# Patient Record
Sex: Female | Born: 1935 | Race: White | Hispanic: No | Smoking: Never smoker
Health system: Southern US, Community
[De-identification: ages and names within clinical notes are randomized; demographics above are authoritative.]

## PROBLEM LIST (undated history)

## (undated) DIAGNOSIS — R51 Headache: Secondary | ICD-10-CM

## (undated) DIAGNOSIS — F32A Depression, unspecified: Secondary | ICD-10-CM

## (undated) DIAGNOSIS — C801 Malignant (primary) neoplasm, unspecified: Secondary | ICD-10-CM

## (undated) DIAGNOSIS — M1990 Unspecified osteoarthritis, unspecified site: Secondary | ICD-10-CM

## (undated) DIAGNOSIS — R0600 Dyspnea, unspecified: Secondary | ICD-10-CM

## (undated) DIAGNOSIS — Z96653 Presence of artificial knee joint, bilateral: Secondary | ICD-10-CM

## (undated) DIAGNOSIS — R519 Headache, unspecified: Secondary | ICD-10-CM

## (undated) DIAGNOSIS — N393 Stress incontinence (female) (male): Secondary | ICD-10-CM

## (undated) DIAGNOSIS — S060X9A Concussion with loss of consciousness of unspecified duration, initial encounter: Secondary | ICD-10-CM

## (undated) DIAGNOSIS — F329 Major depressive disorder, single episode, unspecified: Secondary | ICD-10-CM

## (undated) DIAGNOSIS — S060XAA Concussion with loss of consciousness status unknown, initial encounter: Secondary | ICD-10-CM

## (undated) DIAGNOSIS — I10 Essential (primary) hypertension: Secondary | ICD-10-CM

## (undated) DIAGNOSIS — E039 Hypothyroidism, unspecified: Secondary | ICD-10-CM

## (undated) HISTORY — PX: TONSILLECTOMY: SUR1361

## (undated) HISTORY — PX: EYE SURGERY: SHX253

## (undated) HISTORY — PX: REPLACEMENT TOTAL KNEE BILATERAL: SUR1225

## (undated) HISTORY — PX: ABDOMINAL HYSTERECTOMY: SHX81

## (undated) HISTORY — PX: TRIGGER FINGER RELEASE: SHX641

## (undated) HISTORY — DX: Presence of artificial knee joint, bilateral: Z96.653

## (undated) HISTORY — PX: APPENDECTOMY: SHX54

## (undated) HISTORY — PX: FINGER AMPUTATION: SHX636

## (undated) HISTORY — PX: TUBAL LIGATION: SHX77

## (undated) HISTORY — PX: KNEE ARTHROSCOPY: SHX127

## (undated) HISTORY — PX: CARPAL TUNNEL RELEASE: SHX101

## (undated) HISTORY — PX: COLONOSCOPY: SHX174

---
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
## 1998-03-04 ENCOUNTER — Ambulatory Visit (HOSPITAL_COMMUNITY): Admission: RE | Admit: 1998-03-04 | Discharge: 1998-03-04 | Payer: Self-pay | Admitting: Family Medicine

## 1999-04-10 ENCOUNTER — Ambulatory Visit (HOSPITAL_COMMUNITY): Admission: RE | Admit: 1999-04-10 | Discharge: 1999-04-10 | Payer: Self-pay | Admitting: Family Medicine

## 1999-04-10 ENCOUNTER — Encounter: Payer: Self-pay | Admitting: Family Medicine

## 1999-11-15 ENCOUNTER — Encounter: Payer: Self-pay | Admitting: Orthopedic Surgery

## 1999-11-16 ENCOUNTER — Inpatient Hospital Stay (HOSPITAL_COMMUNITY): Admission: RE | Admit: 1999-11-16 | Discharge: 1999-11-20 | Payer: Self-pay | Admitting: Orthopedic Surgery

## 2001-06-20 ENCOUNTER — Ambulatory Visit (HOSPITAL_COMMUNITY): Admission: RE | Admit: 2001-06-20 | Discharge: 2001-06-20 | Payer: Self-pay | Admitting: Family Medicine

## 2001-06-20 ENCOUNTER — Encounter: Payer: Self-pay | Admitting: Family Medicine

## 2003-05-14 ENCOUNTER — Encounter: Payer: Self-pay | Admitting: Family Medicine

## 2003-05-14 ENCOUNTER — Ambulatory Visit (HOSPITAL_COMMUNITY): Admission: RE | Admit: 2003-05-14 | Discharge: 2003-05-14 | Payer: Self-pay | Admitting: Family Medicine

## 2003-08-31 ENCOUNTER — Encounter: Admission: RE | Admit: 2003-08-31 | Discharge: 2003-08-31 | Payer: Self-pay | Admitting: Orthopedic Surgery

## 2003-09-01 ENCOUNTER — Ambulatory Visit (HOSPITAL_COMMUNITY): Admission: RE | Admit: 2003-09-01 | Discharge: 2003-09-01 | Payer: Self-pay | Admitting: Orthopedic Surgery

## 2003-09-01 ENCOUNTER — Ambulatory Visit (HOSPITAL_BASED_OUTPATIENT_CLINIC_OR_DEPARTMENT_OTHER): Admission: RE | Admit: 2003-09-01 | Discharge: 2003-09-01 | Payer: Self-pay | Admitting: Orthopedic Surgery

## 2004-08-31 ENCOUNTER — Ambulatory Visit (HOSPITAL_COMMUNITY): Admission: RE | Admit: 2004-08-31 | Discharge: 2004-08-31 | Payer: Self-pay | Admitting: Family Medicine

## 2004-12-07 ENCOUNTER — Ambulatory Visit (HOSPITAL_COMMUNITY): Admission: RE | Admit: 2004-12-07 | Discharge: 2004-12-07 | Payer: Self-pay | Admitting: Gastroenterology

## 2006-01-08 ENCOUNTER — Ambulatory Visit (HOSPITAL_COMMUNITY): Admission: RE | Admit: 2006-01-08 | Discharge: 2006-01-08 | Payer: Self-pay | Admitting: Family Medicine

## 2007-01-28 ENCOUNTER — Ambulatory Visit (HOSPITAL_COMMUNITY): Admission: RE | Admit: 2007-01-28 | Discharge: 2007-01-28 | Payer: Self-pay | Admitting: Family Medicine

## 2010-04-02 ENCOUNTER — Encounter: Admission: RE | Admit: 2010-04-02 | Discharge: 2010-04-02 | Payer: Self-pay | Admitting: Orthopaedic Surgery

## 2010-12-15 NOTE — Procedures (Signed)
[place]  Patient:    COLEY, LITTLES                    MRN: [id number] Proc. Date: 11/16/99 Adm. Date:  [id number] Attending:  Drema Pry Dictator:   Bedelia Person, M.D. CC:         Bedelia Person, M.D.                           Procedure Report  PROCEDURE:  Epidural catheter placement for postoperative epidural phentanyl pain control.  Prior to the surgical procedure the risks and benefits of postoperative epidural phentanyl were discussed with the patient, which included the risks of nerve damage, paralysis, back ache, headache, allergic reaction to the anesthetic, nausea, vomiting, and more commonly the risk of pruritis.  All of the patients questions were answered.  The patient had a prior epidural for knee surgery and desired another epidural at this time for postoperative pain control.  DESCRIPTION OF PROCEDURE:  After a surgical dressing was applied the patient was turned to the full right lateral decubitus position.  Betadine prep x 3 was performed on the lumbar region.  Sterile technique was used.  A #17 gauge Tuohy needle was inserted in the midline approach at the L3-4 interspace using loss of resistance.  The epidural space was located on the first pass.  There was no blood or CSF noted.  A non-stiletted catheter was then placed 6 cm into the epidural space and the needle was removed.  There was again a negative aspiration for blood or CSF.  A mixture of preservative-free Xylocaine 1% 5 cc 0.9 normal saline 10 cc and 100 mcg of phentanyl was then slowly injected into the epidural catheter.  The catheter was securely adhesed to the patients back using hypofix dressing over 2 x 2 sterile gauze at the insertion site. The patient was awakened, extubated, and taken to the recovery room in good condition, where she was placed on a continuous infusion of 5 mcg per cc epidural phentanyl, and 1/16% Marcaine at initial rate of 16 cc per  hour.  She will be evaluated over the next 48 hours for adequacy of pain control until her anticoagulant therapy is maximized. DD:  11/16/99 TD:  11/16/99 Job: [id number]

## 2010-12-15 NOTE — Op Note (Signed)
[place]. [facility]  Patient:    Samantha [name], Samantha [name]                    MRN: [id number] Proc. Date: 11/16/99 Adm. Date:  [id number] Attending:  Drema Pry Dictator:   Jearld Adjutant, M.D. CC:         Jearld Adjutant, M.D.                           Operative Report  PREOPERATIVE DIAGNOSIS:  Mechanical failure of meniscal bearings, polyethylene, status post right total knee arthroplasty with Depuy LCS meniscal bearing made.  POSTOPERATIVE DIAGNOSES: 1. Mechanical failure of meniscal bearings, polyethylene, status post right    total knee arthroplasty with Depuy LCS meniscal bearing made. 2. Excessive poly wear of medial and lateral meniscal bearing and posterior    patella.  OPERATIONS: 1. Right knee revision, total knee revision, with exchange of medial and    lateral meniscal bearing polys. 2. Exchange poly on posterior patella.  SURGEON:  Dr. Doristine Section.  ASSISTANTS: 1. Harvie Junior, M.D. 2. Currie Paris. Thedore Mins.  ANESTHESIA:  General endotracheal anesthesia.  CULTURES:  Joint fluid, gram stain negative.  ESTIMATED BLOOD LOSS:  100 cc.  REPLACEMENTS:  None.  TOURNIQUET TIME:  40 minutes.  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  She has had absolutely excellent course and has been extremely active.  She has gained some weight.  Recently in the last three weeks she has noted increasing pain with a varus thrust to her right knee and mechanical derangement of the right knee.  She had no sign of infection, did have some mild swelling.  She came into our office where x-rays revealed that she had marked narrowing of the medial knee, she had a varus thrust, and our feeling was that she had especially damaged the medial meniscal bearing.  It was recognized that these bearings were gamma  sterilized in air, and these poly bearings have been shown to be somewhat less effective.  Everyone was using them at the time, all manufacturers, and this is not the case now with different manufacturing processes.  In addition, their design has been somewhat modified.  We did not know whether or not the femoral component would be scored and have to be changed, or whether there was any loseness, nor exactly what we would find.  However, at surgery we did find evidence of poly wear with synovitis throughout the knee which we excised.  We had to do fairly significant soft tissue releases to get the knee exposed, but were able to remove both meniscal bearings that were markedly degenerated and worn, especially the medial one, and wear on the posterior patellar poly that we removed.  None of the metal components of the patella or femur were loose. There was no sign of infection, and our gram stain on the fluid, which looked non-purulent, was negative.  We did place a 15 mm poly medially bearing and laterally 12.5, replacing two 12.5.  This gave her slightly more valgus, and she had full extension with no significant joggle of motion on varus-valgus stressing.  She had full range of motion, 0 to 105 to 110.  DESCRIPTION OF PROCEDURE:  With adequate anesthesia obtained using endotracheal technique, 1 g of Ancef given IV  prophylaxis, the patient was placed in the supine position.  The right lower extremity was prepped and draped in the standard fashion from the toes to the tourniquet.  After standard prepping and draping, Esmarch exsanguination was used, and the tourniquet was let up to 350 mmHg.  The old wound was then opened.  Incision deepened.  Hemostasis was obtained using the Bovie electrocoagulator.  The median parapatellar retinacular incision was then made.  Soft tissues were dissected, as well as synovectomy of the obvious poly wear synovitis with flakes of polyethylene found in the  pouch and in the gutters.  We did protect the insertion of the patellar tendon with two pins.  We did a lateral retinacular release to facilitate the patellar eversion.  Later, we repaired this with one Mitek super anchor with #2 Ethibond.  In any case, the patella was ultimately everted, the knee flexed, and soft tissues dissected around underneath the medial collateral ligament and laterally in the notch.  The PCL was still intact, and did not have any significant posterior sag.  We then noted the poly wear and removed the meniscal bearings, as well as the posterior patella bearing.  We further excised soft tissues, and then thoroughly jet lavaged the knee with 3 L of normal saline and 500 mL of antibiotic solution.  We did trials and felt the 15 mm, 12.5 mm mismatch medial to lateral, respectively, was the most appropriate, and brought those on to the field, checked them for size, standard plus to large was the bearing size, and a standard plus patellar button.  The patellar button was then snapped in place, as was the meniscal bearings put in place with the above listed range of motion with good stability, no tendency to squirt forward on flexion.  Thorough irrigation was carried out again, and we let the tourniquet down, bleeding points were cauterized.  I then repaired with a Supramitek to the tibial tubercle, which still had a good attachment, but this was extra safety where a slight portion of it, probably 7 mm, had been evulsed, and this was sutured down with a horizontal mattress suture of #2 Ethibond.  Hemovac drains were placed in the medial and lateral gutter and brought out through the superolateral portal.  The wound was then closed in layers with #1 Vicryl on the retinaculum, 0 to 2-0 Vicryl on the subcutaneous, and skin staples. Bulk sterile compressive dressings applied with knee immobilizer, and Hemovac to autovac.  The patient, having tolerated the procedure well, was  awakened, taken to the recovery room in satisfactory condition for routine postoperative care, CT, and anticoagulant therapy. DD:  11/16/99 TD:  11/16/99 Job: [id number]  [id number]

## 2010-12-15 NOTE — Discharge Summary (Signed)
[place]. [facility]  Patient:    Samantha [name], Samantha [name]                    MRN: [id number] Adm. Date:  [id number] Disc. Date: [id number] Attending:  Drema Pry Dictator:   Currie Paris Orma Flaming, P.A.-C. CC:         Marinda Elk, M.D., [facility]                           Discharge Summary  ADMITTING DIAGNOSES: 1. 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 2. Status post right total knee replacement, February, 1995. 3. Status post left total knee replacement, March, 1995.  DISCHARGE DIAGNOSES: 1. 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 2. Status post right total knee replacement, February, 1995. 3. Status post left total knee replacement, March, 1995. 4. Mechanical failure of medial and lateral bearings and patellar polyethylene    component of right total knee replacement. 5. Urinary tract infection.  PROCEDURES IN HOSPITAL:  Revision of right total knee arthroplasty with changing of medial and lateral meniscal bearings and posterior patellar polyethylene component.  HISTORY OF PRESENT ILLNESS:  Samantha [name] is a 77 year old who is status post right TKR done six years ago.  Four to five months prior to this admission, she had onset of progressive pain with varus angulation of her right knee. She has not had an acute injury to it.  This slowly progressed over time.  She has gotten to the point where she has difficulty ambulating due to pain and plain x-rays were taken of the right knee weightbearing, and showed varus configuration with "crushing" of the medial bearing.  It was felt that she needed a revision total knee arthroplasty based upon her clinical and radiographic findings, and she was admitted for this.  PERTINENT LABORATORY STUDIES:  EKG on admission showed normal sinus rhythm, normal EKG.  Preoperative chest x-ray was  normal.  On admission, her hemoglobin was 11.8, hematocrit 36.1, WBC 6.1.  Hemoglobin on postoperative day #1 was 10.2, hemoglobin on postoperative day #2 was 9.6, and on #3 was 9.4.  Pro time on admission was 13.7 seconds with an INR of 1.1, PTT was 29.  On the date of discharge, her pro time was 17.5 seconds with an INR of 1.8.  CMET on admission was within normal limits.  Urinalysis showed no abnormalities.  Gram stain intraoperatively of the left knee showed no organisms.  Preoperative urine culture showed greater than 100,000 colonies of multiple species.  No organisms were seen on an anaerobic culture, as well. There was no growth, there were no anaerobes isolated.  HOSPITAL COURSE:  Patient underwent right total knee revision as well as described on her postoperative note on November 16, 1999.  She had an epidural Fentanyl catheter postoperatively which was placed by anesthesia.  She was also started on Coumadin antithrombolytic therapy per pharmacy protocol. Also, IV Ancef 1 g q.8h. was given x 6 doses.  On postoperative day #1, she had no pain, she had some nausea and vomiting.  Her IV pulled out.  Her temperature max was 101.2, then she was found to be afebrile.  Her vital signs were stable.  Her hemoglobin was 10.2.  Knee immobilizer was intact to her right leg.  NVS was intact distally.  She had greater  than 100,000 colonies of multiple species on her urine culture.  She was placed on Cipro 500 mg p.o. b.i.d. for two days in addition to the IV Ancef.  On postoperative day #2, the patient was doing well.  She did have a CPM machine for her knee and physical therapy was ordered for walker ambulation, weightbearing as tolerated on the right.  Her dressing was changed on postoperative day #2, and her Hemovac drain was discontinued.  Overall, she progressed very nicely.  On postoperative day #3, she was doing well with a low-grade fever of 102 maximum.  Her Hep-Lock was discontinued  on postoperative day #4, patient was doing well.  She was without complaint.  She was taking p.o. and voiding okay. She was up with physical therapy.  She was afebrile.  Her vital signs were stable.  Her right knee wound was benign.  Her calf was soft.  Her NVS was intact distally.  Pro time was 17.5 seconds and INR was 1.8.  She was discharged home in improved condition.  She was on a regular diet.  She was given prescription for Coumadin x 1 month postoperative, Tylox p.r.n. for pain, and Keflex 500 mg p.o. t.i.d. x 7 days.  Home health physical therapy was ordered for walker ambulation, weightbearing as tolerated on the right, as well as range of motion.  She will follow up in our office for 10 days for recheck. DD:  01/04/00 TD:  01/08/00 Job: [id number]

## 2010-12-15 NOTE — Op Note (Signed)
NAME:  Samantha [name], BUFFIN                       ACCOUNT NO.:  [id number]   MEDICAL RECORD NO.:  [id number]                   PATIENT TYPE:  AMB   LOCATION:  DSC                                  FACILITY:  [facility]   PHYSICIAN:  Harvie Junior, M.D.                DATE OF BIRTH:  Apr 18, 1931   DATE OF PROCEDURE:  09/01/2003  DATE OF DISCHARGE:                                 OPERATIVE REPORT   PREOPERATIVE DIAGNOSIS:  Trigger finger left ring.   POSTOPERATIVE DIAGNOSIS:  Trigger finger left ring.   PROCEDURE:  Release of left ring trigger finger.   SURGEON:  Harvie Junior, M.D.   ASSISTANT:  Marshia Ly, P.A.   ANESTHESIA:  Forearm based IV regional.   INDICATIONS FOR PROCEDURE:  This is a 77 year old female with a long history  of having locking and catching in the fingers.  She has been injected a  couple of times.  This worked initially, but continued to catch and lock on  her.  Because of continued catching and locking, she was ultimately brought  to the operating room for release of trigger digit.   DESCRIPTION OF PROCEDURE:  The patient was brought to the operating room and  after adequate anesthesia was obtained with the forearm based IV regional,  the patient was placed supine on the operating table and the left arm was  prepped and draped in the usual sterile fashion.  Following this, linear  incision was made between the two creases in the hand.  Subcutaneous tissues  were dissected down to the level of the A1 pulley.  The A1 pulley was  clearly identified and divided.  There were some horizontal appearing  fascial bands covering the A1 pulley. These were also divided both  proximally and distally.  The digital nerves were identified on the medial  and lateral side and protected with retractors.  At this point, the wound  was copiously irrigated and suctioned dry. The flexor tendons were pulled  out of the wound with a retractor and the hand was allowed to move under  visualization with no tendency towards catching.  At this point, the hand  was copiously irrigated and suctioned dry.  The skin was closed with 4-0  nylon interrupted stitches.  A sterile compressive dressing was applied. The  patient was taken to the recovery room where she was noted to be in  satisfactory condition.  Estimated blood loss for the procedure was none.                                               Harvie Junior, M.D.    Ranae Plumber  D:  09/01/2003  T:  09/01/2003  Job:  [id number]

## 2010-12-15 NOTE — Op Note (Signed)
NAMEDENISA, [name]             ACCOUNT NO.:  [id number]   MEDICAL RECORD NO.:  [id number]          PATIENT TYPE:  AMB   LOCATION:  ENDO                         FACILITY:  [facility]   PHYSICIAN:  Bernette Redbird, M.D.   DATE OF BIRTH:  12/30/1930   DATE OF PROCEDURE:  12/07/2004  DATE OF DISCHARGE:                                 OPERATIVE REPORT   PROCEDURE:  Colonoscopy.   INDICATIONS:  A 77 year old for initial colon cancer screening exam. No  worrisome risk factors or symptoms.   FINDINGS:  Sigmoid diverticulosis.   PROCEDURE IN DETAIL:  The purpose and risks of the procedure have been  discussed with the patient who provided written consent. Sedation was  fentanyl 75 mcg and Versed 6 mg intravenously without arrhythmias or  desaturation. The Olympus adjustable tension adult video colonoscope was  advanced to the terminal ileum, turning the patient into the supine position  to enter the base of the cecum. The terminal ileum was entered for a short  distance and appeared normal. Pullback was then performed. The quality of  the prep was excellent and it was felt that all areas were well seen.   There was mild to moderate sigmoid diverticulosis but this was otherwise a  normal exam without evidence of polyps, cancer, colitis, vascular  malformations, or diverticulosis. Retroflexion the rectum and reinspection  of the rectum were unremarkable. No biopsies were obtained. The patient  tolerated the procedure well. There no apparent complications.   IMPRESSION:  Unremarkable screening exam in a standard risk individual,  apart from sigmoid diverticulosis (V7 6.51).   PLAN:  Consider sigmoidoscopic evaluation in 5 years for continued  screening.      RB/MEDQ  D:  12/07/2004  T:  12/07/2004  Job:  [id number]   cc:   Molly Maduro L. Foy [place], M.D.  [address] [place]  [place] [address]  Fax: [phone]

## 2012-03-11 ENCOUNTER — Ambulatory Visit (HOSPITAL_BASED_OUTPATIENT_CLINIC_OR_DEPARTMENT_OTHER)
Admission: RE | Admit: 2012-03-11 | Discharge: 2012-03-11 | Disposition: A | Discharge status: Home / Self Care | Payer: Medicare Other | Source: Ambulatory Visit | Attending: Orthopedic Surgery | Admitting: Orthopedic Surgery

## 2012-03-11 ENCOUNTER — Encounter (HOSPITAL_BASED_OUTPATIENT_CLINIC_OR_DEPARTMENT_OTHER): Payer: Self-pay | Admitting: *Deleted

## 2012-03-11 ENCOUNTER — Encounter (HOSPITAL_BASED_OUTPATIENT_CLINIC_OR_DEPARTMENT_OTHER)
Admission: RE | Disposition: A | Discharge status: Home / Self Care | Payer: Self-pay | Source: Ambulatory Visit | Attending: Orthopedic Surgery

## 2012-03-11 DIAGNOSIS — M86149 Other acute osteomyelitis, unspecified hand: Secondary | ICD-10-CM | POA: Insufficient documentation

## 2012-03-11 DIAGNOSIS — S62639B Displaced fracture of distal phalanx of unspecified finger, initial encounter for open fracture: Secondary | ICD-10-CM | POA: Insufficient documentation

## 2012-03-11 DIAGNOSIS — W540XXA Bitten by dog, initial encounter: Secondary | ICD-10-CM | POA: Insufficient documentation

## 2012-03-11 DIAGNOSIS — IMO0002 Reserved for concepts with insufficient information to code with codable children: Secondary | ICD-10-CM | POA: Insufficient documentation

## 2012-03-11 DIAGNOSIS — L03019 Cellulitis of unspecified finger: Secondary | ICD-10-CM | POA: Insufficient documentation

## 2012-03-11 HISTORY — PX: I&D EXTREMITY: SHX5045

## 2012-03-11 HISTORY — DX: Essential (primary) hypertension: I10

## 2012-03-11 HISTORY — DX: Unspecified osteoarthritis, unspecified site: M19.90

## 2012-03-11 HISTORY — DX: Hypothyroidism, unspecified: E03.9

## 2012-03-11 SURGERY — MINOR IRRIGATION AND DEBRIDEMENT EXTREMITY
Anesthesia: LOCAL | Site: Finger | Laterality: Right | Wound class: Dirty or Infected

## 2012-03-11 MED ORDER — 0.9 % SODIUM CHLORIDE (POUR BTL) OPTIME
TOPICAL | Status: DC | PRN
  Administered 2012-03-11: 100 mL

## 2012-03-11 MED ORDER — LIDOCAINE HCL 2 % IJ SOLN
INTRAMUSCULAR | Status: DC | PRN
  Administered 2012-03-11: 5 mL

## 2012-03-11 MED ORDER — OXYCODONE-ACETAMINOPHEN 5-325 MG PO TABS: ORAL_TABLET | ORAL | Status: DC

## 2012-03-11 SURGICAL SUPPLY — 48 items
BANDAGE ADHESIVE 1X3 (GAUZE/BANDAGES/DRESSINGS) IMPLANT
BLADE SURG 15 STRL LF DISP TIS (BLADE) ×1 IMPLANT
BLADE SURG 15 STRL SS (BLADE) ×2
BNDG CMPR 9X4 STRL LF SNTH (GAUZE/BANDAGES/DRESSINGS)
BNDG CMPR MD 5X2 ELC HKLP STRL (GAUZE/BANDAGES/DRESSINGS)
BNDG COHESIVE 1X5 TAN STRL LF (GAUZE/BANDAGES/DRESSINGS) ×2 IMPLANT
BNDG COHESIVE 4X5 TAN STRL (GAUZE/BANDAGES/DRESSINGS) IMPLANT
BNDG ELASTIC 2 VLCR STRL LF (GAUZE/BANDAGES/DRESSINGS) IMPLANT
BNDG ESMARK 4X9 LF (GAUZE/BANDAGES/DRESSINGS) IMPLANT
BRUSH SCRUB EZ PLAIN DRY (MISCELLANEOUS) ×2 IMPLANT
CLOTH BEACON ORANGE TIMEOUT ST (SAFETY) ×2 IMPLANT
CORDS BIPOLAR (ELECTRODE) IMPLANT
COVER MAYO STAND STRL (DRAPES) ×2 IMPLANT
CUFF TOURNIQUET SINGLE 18IN (TOURNIQUET CUFF) ×2 IMPLANT
DECANTER SPIKE VIAL GLASS SM (MISCELLANEOUS) IMPLANT
DRAIN PENROSE 1/2X12 LTX STRL (WOUND CARE) ×1 IMPLANT
DRAIN PENROSE 1/4X12 LTX STRL (WOUND CARE) IMPLANT
DRAPE EXTREMITY T 121X128X90 (DRAPE) ×1 IMPLANT
DRAPE SURG 17X23 STRL (DRAPES) ×2 IMPLANT
DRAPE U 20/CS (DRAPES) IMPLANT
DRAPE U-SHAPE 47X51 STRL (DRAPES) IMPLANT
DRAPE U-SHAPE 76X120 STRL (DRAPES) IMPLANT
DRSG PAD ABDOMINAL 8X10 ST (GAUZE/BANDAGES/DRESSINGS) IMPLANT
GAUZE PACKING IODOFORM 1/4X5 (PACKING) ×1 IMPLANT
GAUZE SPONGE 4X4 12PLY STRL LF (GAUZE/BANDAGES/DRESSINGS) ×4 IMPLANT
GAUZE XEROFORM 1X8 LF (GAUZE/BANDAGES/DRESSINGS) ×1 IMPLANT
GLOVE BIO SURGEON STRL SZ7 (GLOVE) ×2 IMPLANT
GLOVE BIOGEL M STRL SZ7.5 (GLOVE) ×2 IMPLANT
GLOVE ORTHO TXT STRL SZ7.5 (GLOVE) ×2 IMPLANT
GOWN PREVENTION PLUS XLARGE (GOWN DISPOSABLE) ×4 IMPLANT
NEEDLE 27GAX1X1/2 (NEEDLE) ×2 IMPLANT
PACK BASIN DAY SURGERY FS (CUSTOM PROCEDURE TRAY) ×2 IMPLANT
PADDING CAST ABS 4INX4YD NS (CAST SUPPLIES)
PADDING CAST ABS COTTON 4X4 ST (CAST SUPPLIES) ×1 IMPLANT
SPONGE GAUZE 4X4 12PLY (GAUZE/BANDAGES/DRESSINGS) ×2 IMPLANT
STOCKINETTE 4X48 STRL (DRAPES) ×2 IMPLANT
STRIP CLOSURE SKIN 1/2X4 (GAUZE/BANDAGES/DRESSINGS) IMPLANT
SUT ETHILON 5 0 P 3 18 (SUTURE)
SUT NYLON ETHILON 5-0 P-3 1X18 (SUTURE) ×1 IMPLANT
SUT PROLENE 3 0 PS 2 (SUTURE) IMPLANT
SWAB CULTURE LIQ STUART DBL (MISCELLANEOUS) IMPLANT
SYR 3ML 23GX1 SAFETY (SYRINGE) IMPLANT
SYR CONTROL 10ML LL (SYRINGE) ×2 IMPLANT
TOWEL OR 17X24 6PK STRL BLUE (TOWEL DISPOSABLE) ×3 IMPLANT
TRAY DSU PREP LF (CUSTOM PROCEDURE TRAY) ×2 IMPLANT
TUBE ANAEROBIC SPECIMEN COL (MISCELLANEOUS) ×1 IMPLANT
UNDERPAD 30X30 INCONTINENT (UNDERPADS AND DIAPERS) ×2 IMPLANT
WATER STERILE IRR 1000ML POUR (IV SOLUTION) ×1 IMPLANT

## 2012-03-11 NOTE — Brief Op Note (Signed)
03/11/2012  9:52 AM  PATIENT:  Samantha Stone  78 y.o. female  PRE-OPERATIVE DIAGNOSIS:  right little finger dog bite / open fracture of distal phalanx 8 days prior  POST-OPERATIVE DIAGNOSIS:  Felon. 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  PROCEDURE:  Procedure(s) (LRB): IRRIGATION AND DEBRIDEMENT OPEN FRACTURE OF RIGHT SMALL DISTAL PHALANX, DRAINAGE OF PARONYCHIA AND FELON WITH CULTURE  SURGEON:  Surgeon(s) and Role:    * Wyn Forster., MD - Primary  PHYSICIAN ASSISTANT:   ASSISTANTS: Mallory Shirk.A-C   ANESTHESIA:   local  EBL:     BLOOD ADMINISTERED:none  DRAINS: none   LOCAL MEDICATIONS USED:  XYLOCAINE   SPECIMEN:  No Specimen  DISPOSITION OF SPECIMEN:  N/A  COUNTS:  YES  TOURNIQUET:   Total Tourniquet Time Documented: area (Right) - 11 minutes  DICTATION: .Other Dictation: Dictation Number [id number]  PLAN OF CARE: Discharge to home after PACU  PATIENT DISPOSITION:  PACU - hemodynamically stable.

## 2012-03-11 NOTE — Op Note (Signed)
[id number] 

## 2012-03-11 NOTE — H&P (Addendum)
  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 She underwent bilateral total knee replacement. She has done well with these since that time. She reports that 8 days ago she sustained a small bite to the tip of her right small finger from her own dog. Her dog's shots are all up-to-date. She cleaned the wound with soap and water and did not seek any medical care until 3 days later, when she developed some pain and swelling. At that time she went to her primary care physician. He placed her on Augmentin and performed a minor incision and drainage with cultures. The cultures eventually grew no significant growth. 3 days later she noted a marked increase in the pain and swelling of the finger. She went to urgent care locally. They performed additional incision and drainage and placed her on Septra. The patient did not tolerate the Septra, as it gave her a cough she continued with the Augmentin. Several days later she noted continued pain and swelling and telephone our office for further care. On exam, her finger has 2+ swelling of the pulp. She is quite tender to palpation. There is rubor/erythema of the entire distal phalanx with serosanguineous drainage from several of the volar incisions or vascular she is intact X ray of the small finger from Dr Pablo Lawrence office documents a fracture of the distal metaphysis of the distal phalanx. Impression: Felon of the right small finger.  Open fracture of distal phalanx.  Plan: Patient be taken to the operating room to undergo incision and drainage with possible placement of drain of the right small finger. The procedure risks, benefits, and postoperative course were discussed with the patient at length and she was in agreement with this plan.  Jonni Sanger PA-C  H&P documentation: 03/11/2012  -History and Physical Reviewed  -Patient has been re-examined  -No change in the plan of care  Wyn Forster, MD

## 2012-03-12 ENCOUNTER — Encounter (HOSPITAL_BASED_OUTPATIENT_CLINIC_OR_DEPARTMENT_OTHER): Payer: Self-pay | Admitting: Orthopedic Surgery

## 2012-03-12 NOTE — Op Note (Signed)
Samantha Stone             ACCOUNT NO.:  [id number]  MEDICAL RECORD NO.:  [id number]  LOCATION:                                 FACILITY:  PHYSICIAN:  Samantha Fitch. Thayer Inabinet, M.D.      DATE OF BIRTH:  DATE OF PROCEDURE:  03/11/2012 DATE OF DISCHARGE:                              OPERATIVE REPORT   PREOPERATIVE DIAGNOSIS:  Eight days status post complex dog bite wound to right small finger, distal phalanx, and nail mechanism.  POSTOPERATIVE DIAGNOSIS:  Acute osteomyelitis of open fracture of right small finger distal phalanx and subungual purulent collection i.e., stage III paronychia and advanced Phalen status post prior attempted drainage at outpatient center.  OPERATION: 1. Removal of nail plate and drainage of subungual abscess right small     finger. 2. Incision of felon with debridement of osteomyelitis of open     fracture of distal phalanx with anaerobic culture.  OPERATING SURGEON:  Samantha Fitch. Deeya Richeson, M.D.  ASSISTANT:  Jonni Sanger, P.A.  ANESTHESIA:  A 2% lidocaine metacarpal head level block of right small finger.  This was performed in minor operating room procedure.  INDICATIONS:  Samantha Stone is a 78 year old, right-hand dominant retired Producer, television/film/video.  We are well acquainted with Samantha Stone having worked with her throughout the 1980s and 1990s at [facility].  She had a 10-year-old Samantha Stone, who unfortunately fell and injured his back on March 03, 2012.  In attempting to help the dog she sustained a dog bite to her small finger.  She took the dog to a urgent Veterinary Clinic and was advised that the dog had a spinal fracture and paralysis.  The dog was ultimately put to sleep.  Within 48 hours Samantha Stone developed profound swelling and discomfort in her finger tips.  She was seen at an outpatient center where a block was placed in her finger and a small transverse incision was placed in the pulp in attempt to drain the finger.  This was unsuccessful and  she went on to develop profound discomfort including rubor drainage from beneath her nail plate and erythema ascending proximally towards the proximal phalangeal segment of the finger.  She was seen by Dr. Annitta Needs of [facility], who noted an acute Phalen late on the afternoon of March 10, 2012.  He requested a Hand Surgery consult.  I asked Dr. Annitta Needs to obtain an x-ray and to look for signs of a fracture or bone abscess.  Indeed a fracture of the distal phalanx and distal metaphysis was noted.  Arrangements were made for Samantha Stone to be n.p.o. and to be seen at 7:30 this morning for acute incision and drainage followed by cultures.  She was on oral Augmentin prior to surgery from the urgent care center.  PROCEDURE IN DETAIL:  Samantha Stone was brought to room #3 at the [facility] and placed in supine position on the operating table.  Following informed consent and Betadine prep.  A 2% lidocaine metacarpal head level block was placed without complication.  Within 5 minutes, excellent anesthesia of the small finger was achieved.  The right hand and arm were then prepped with Betadine soap and solution,  sterilely draped.  Following routine surgical time-out, the small finger was exsanguinated with gauze wrap and a 0.5-inch Penrose drain was placed with proximal phalangeal bases with digital tourniquet.  Confirming the anesthesia satisfactory, we subsequently removed the nail plate and found a significant abscess and purulent material deep to the dorsal nail fold.  There was granulation tissue present and a window directly through the distal phalanx where the tooth had passed.  A microcurette was used to remove all granulation tissue.  Attention was then directed to the palmar surface of the finger where midline incision was fashioned directly to the area of prior drainage.  We extended the incision to the distal phalanx and identified a pocket of necrotic tissue directly  at the open fracture.  A microcurette was used through the fracture site to remove devitalized bone and to thoroughly curette all granulation tissue.  A anaerobic culture swab was used to obtain cultures to rule out Eikenella corrodens and pasteurella species from this bite.  The palmar wound was then packed with iodoform gauze.  Open and the dorsal wound dressed with Xeroflo without replacement of the nail plate.  Samantha Stone  will remain on Augmentin 875 mg p.o. b.i.d.  She is provided a prescription for Percocet 5 mg 1 p.o. q.4-6 hours p.r.n. pain, #30 tablets, without refill.  We will also advised to take Aleve 2 tabs morning and 2 tablets in the evening.  We will see her back for followup in our office in 24 hours to review her early culture results, and to perform a dressing change.  She understands she may have a nail deformity due to the degree of infection and damage to the ventral nail matrix.  It is possible she will develop a nonunion of her fracture chronic osteomyelitis due to the 8th day delay in definitive care.     Samantha Stone, M.D.     RVS/MEDQ  D:  03/11/2012  T:  03/12/2012  Job:  [id number]  cc:   Ivar Bury, MD

## 2012-03-15 LAB — ANAEROBIC CULTURE: Gram Stain: NONE SEEN

## 2012-03-24 ENCOUNTER — Other Ambulatory Visit: Payer: Self-pay

## 2012-03-24 ENCOUNTER — Encounter (HOSPITAL_BASED_OUTPATIENT_CLINIC_OR_DEPARTMENT_OTHER)
Admission: RE | Admit: 2012-03-24 | Discharge: 2012-03-24 | Disposition: A | Discharge status: Home / Self Care | Payer: Medicare Other | Source: Ambulatory Visit | Attending: Orthopedic Surgery | Admitting: Orthopedic Surgery

## 2012-03-24 ENCOUNTER — Other Ambulatory Visit: Payer: Self-pay | Admitting: Orthopedic Surgery

## 2012-03-24 ENCOUNTER — Encounter (HOSPITAL_BASED_OUTPATIENT_CLINIC_OR_DEPARTMENT_OTHER): Payer: Self-pay | Admitting: *Deleted

## 2012-03-24 LAB — BASIC METABOLIC PANEL
BUN: 13 mg/dL (ref 6–23)
CO2: 30 mEq/L (ref 19–32)
Calcium: 9.6 mg/dL (ref 8.4–10.5)
Chloride: 97 mEq/L (ref 96–112)
Creatinine, Ser: 0.88 mg/dL (ref 0.50–1.10)
GFR calc Af Amer: 71 mL/min — ABNORMAL LOW (ref >90)
GFR calc non Af Amer: 61 mL/min — ABNORMAL LOW (ref >90)
Glucose, Bld: 98 mg/dL (ref 70–99)
Potassium: 4.1 mEq/L (ref 3.5–5.1)
Sodium: 136 mEq/L (ref 135–145)

## 2012-03-24 NOTE — Progress Notes (Signed)
To come in for bmet-ekg-retired or nurse

## 2012-03-25 ENCOUNTER — Ambulatory Visit (HOSPITAL_BASED_OUTPATIENT_CLINIC_OR_DEPARTMENT_OTHER): Payer: Medicare Other | Admitting: Anesthesiology

## 2012-03-25 ENCOUNTER — Encounter (HOSPITAL_BASED_OUTPATIENT_CLINIC_OR_DEPARTMENT_OTHER)
Admission: RE | Disposition: A | Discharge status: Home / Self Care | Payer: Self-pay | Source: Ambulatory Visit | Attending: Orthopedic Surgery

## 2012-03-25 ENCOUNTER — Encounter (HOSPITAL_BASED_OUTPATIENT_CLINIC_OR_DEPARTMENT_OTHER): Payer: Self-pay | Admitting: Anesthesiology

## 2012-03-25 ENCOUNTER — Encounter (HOSPITAL_BASED_OUTPATIENT_CLINIC_OR_DEPARTMENT_OTHER): Payer: Self-pay

## 2012-03-25 ENCOUNTER — Ambulatory Visit (HOSPITAL_BASED_OUTPATIENT_CLINIC_OR_DEPARTMENT_OTHER)
Admission: RE | Admit: 2012-03-25 | Discharge: 2012-03-25 | Disposition: A | Discharge status: Home / Self Care | Payer: Medicare Other | Source: Ambulatory Visit | Attending: Orthopedic Surgery | Admitting: Orthopedic Surgery

## 2012-03-25 DIAGNOSIS — Z90710 Acquired absence of both cervix and uterus: Secondary | ICD-10-CM | POA: Insufficient documentation

## 2012-03-25 DIAGNOSIS — E039 Hypothyroidism, unspecified: Secondary | ICD-10-CM | POA: Insufficient documentation

## 2012-03-25 DIAGNOSIS — Z01812 Encounter for preprocedural laboratory examination: Secondary | ICD-10-CM | POA: Insufficient documentation

## 2012-03-25 DIAGNOSIS — M129 Arthropathy, unspecified: Secondary | ICD-10-CM | POA: Insufficient documentation

## 2012-03-25 DIAGNOSIS — Z01810 Encounter for preprocedural cardiovascular examination: Secondary | ICD-10-CM | POA: Insufficient documentation

## 2012-03-25 DIAGNOSIS — I10 Essential (primary) hypertension: Secondary | ICD-10-CM | POA: Insufficient documentation

## 2012-03-25 DIAGNOSIS — M869 Osteomyelitis, unspecified: Secondary | ICD-10-CM | POA: Insufficient documentation

## 2012-03-25 DIAGNOSIS — Z96659 Presence of unspecified artificial knee joint: Secondary | ICD-10-CM | POA: Insufficient documentation

## 2012-03-25 SURGERY — INCISION AND DRAINAGE, ABSCESS
Anesthesia: General | Site: Finger | Laterality: Right | Wound class: Dirty or Infected

## 2012-03-25 MED ORDER — ONDANSETRON HCL 4 MG/2ML IJ SOLN
INTRAMUSCULAR | Status: DC | PRN
  Administered 2012-03-25: 4 mg via INTRAVENOUS

## 2012-03-25 MED ORDER — FENTANYL CITRATE 0.05 MG/ML IJ SOLN: 50.0000 ug | INTRAMUSCULAR | Status: DC | PRN

## 2012-03-25 MED ORDER — MIDAZOLAM HCL 5 MG/5ML IJ SOLN
INTRAMUSCULAR | Status: DC | PRN
  Administered 2012-03-25: 2 mg via INTRAVENOUS

## 2012-03-25 MED ORDER — CHLORHEXIDINE GLUCONATE 4 % EX LIQD: 60.0000 mL | Freq: Once | CUTANEOUS | Status: DC

## 2012-03-25 MED ORDER — SULFAMETHOXAZOLE-TRIMETHOPRIM 800-160 MG PO TABS: 1.0000 | ORAL_TABLET | Freq: Two times a day (BID) | ORAL | Status: AC

## 2012-03-25 MED ORDER — HYDROMORPHONE HCL PF 1 MG/ML IJ SOLN
0.2500 mg | INTRAMUSCULAR | Status: DC | PRN
  Administered 2012-03-25 (×2): 0.5 mg via INTRAVENOUS

## 2012-03-25 MED ORDER — PROPOFOL 10 MG/ML IV BOLUS
INTRAVENOUS | Status: DC | PRN
  Administered 2012-03-25: 180 mg via INTRAVENOUS

## 2012-03-25 MED ORDER — OXYCODONE-ACETAMINOPHEN 5-325 MG PO TABS
1.0000 | ORAL_TABLET | ORAL | Status: DC | PRN
  Administered 2012-03-25: 1 via ORAL

## 2012-03-25 MED ORDER — BUPIVACAINE HCL (PF) 0.25 % IJ SOLN
INTRAMUSCULAR | Status: DC | PRN
  Administered 2012-03-25: 5 mL

## 2012-03-25 MED ORDER — CHLORHEXIDINE GLUCONATE 4 % EX LIQD
60.0000 mL | Freq: Once | CUTANEOUS | Status: DC
Start: 2012-03-25 — End: 2012-03-25

## 2012-03-25 MED ORDER — MIDAZOLAM HCL 2 MG/2ML IJ SOLN: 1.0000 mg | INTRAMUSCULAR | Status: DC | PRN

## 2012-03-25 MED ORDER — LACTATED RINGERS IV SOLN
INTRAVENOUS | Status: DC
  Administered 2012-03-25: 09:00:00 via INTRAVENOUS
  Administered 2012-03-25: 10 mL/h via INTRAVENOUS

## 2012-03-25 MED ORDER — LIDOCAINE HCL (CARDIAC) 20 MG/ML IV SOLN
INTRAVENOUS | Status: DC | PRN
  Administered 2012-03-25: 80 mg via INTRAVENOUS

## 2012-03-25 MED ORDER — DEXAMETHASONE SODIUM PHOSPHATE 10 MG/ML IJ SOLN
INTRAMUSCULAR | Status: DC | PRN
  Administered 2012-03-25: 10 mg via INTRAVENOUS

## 2012-03-25 MED ORDER — VANCOMYCIN HCL 1000 MG IV SOLR
1500.0000 mg | Freq: Once | INTRAVENOUS | Status: AC
  Administered 2012-03-25: 1500 mg via INTRAVENOUS

## 2012-03-25 MED ORDER — FENTANYL CITRATE 0.05 MG/ML IJ SOLN
INTRAMUSCULAR | Status: DC | PRN
  Administered 2012-03-25 (×2): 50 ug via INTRAVENOUS

## 2012-03-25 MED ORDER — OXYCODONE-ACETAMINOPHEN 7.5-500 MG PO TABS: 1.0000 | ORAL_TABLET | ORAL | Status: AC | PRN

## 2012-03-25 MED ORDER — PROMETHAZINE HCL 25 MG/ML IJ SOLN: 6.2500 mg | INTRAMUSCULAR | Status: DC | PRN

## 2012-03-25 SURGICAL SUPPLY — 46 items
BAG DECANTER FOR FLEXI CONT (MISCELLANEOUS) IMPLANT
BANDAGE GAUZE ELAST BULKY 4 IN (GAUZE/BANDAGES/DRESSINGS) IMPLANT
BLADE MINI RND TIP GREEN BEAV (BLADE) IMPLANT
BLADE SURG 15 STRL LF DISP TIS (BLADE) ×1 IMPLANT
BLADE SURG 15 STRL SS (BLADE) ×2
BNDG CMPR 9X4 STRL LF SNTH (GAUZE/BANDAGES/DRESSINGS)
BNDG COHESIVE 1X5 TAN STRL LF (GAUZE/BANDAGES/DRESSINGS) IMPLANT
BNDG COHESIVE 3X5 TAN STRL LF (GAUZE/BANDAGES/DRESSINGS) IMPLANT
BNDG ESMARK 4X9 LF (GAUZE/BANDAGES/DRESSINGS) IMPLANT
CHLORAPREP W/TINT 26ML (MISCELLANEOUS) ×2 IMPLANT
CLOTH BEACON ORANGE TIMEOUT ST (SAFETY) ×2 IMPLANT
CORDS BIPOLAR (ELECTRODE) ×2 IMPLANT
COVER MAYO STAND STRL (DRAPES) ×2 IMPLANT
COVER TABLE BACK 60X90 (DRAPES) ×2 IMPLANT
CUFF TOURNIQUET SINGLE 18IN (TOURNIQUET CUFF) IMPLANT
DRAPE EXTREMITY T 121X128X90 (DRAPE) ×2 IMPLANT
DRAPE SURG 17X23 STRL (DRAPES) ×2 IMPLANT
DRSG KUZMA FLUFF (GAUZE/BANDAGES/DRESSINGS) ×2 IMPLANT
GAUZE PACKING IODOFORM 1/4X5 (PACKING) IMPLANT
GAUZE XEROFORM 1X8 LF (GAUZE/BANDAGES/DRESSINGS) ×2 IMPLANT
GLOVE BIO SURGEON STRL SZ 6.5 (GLOVE) ×2 IMPLANT
GLOVE SURG ORTHO 8.0 STRL STRW (GLOVE) ×2 IMPLANT
GOWN BRE IMP PREV XXLGXLNG (GOWN DISPOSABLE) ×2 IMPLANT
GOWN PREVENTION PLUS XLARGE (GOWN DISPOSABLE) ×2 IMPLANT
LOOP VESSEL MAXI BLUE (MISCELLANEOUS) IMPLANT
NEEDLE 27GAX1X1/2 (NEEDLE) ×1 IMPLANT
NS IRRIG 1000ML POUR BTL (IV SOLUTION) ×2 IMPLANT
PACK BASIN DAY SURGERY FS (CUSTOM PROCEDURE TRAY) ×2 IMPLANT
PAD CAST 3X4 CTTN HI CHSV (CAST SUPPLIES) IMPLANT
PADDING CAST ABS 4INX4YD NS (CAST SUPPLIES) ×1
PADDING CAST ABS COTTON 4X4 ST (CAST SUPPLIES) ×1 IMPLANT
PADDING CAST COTTON 3X4 STRL (CAST SUPPLIES)
SPLINT PLASTER CAST XFAST 3X15 (CAST SUPPLIES) IMPLANT
SPLINT PLASTER XTRA FASTSET 3X (CAST SUPPLIES)
SPONGE GAUZE 4X4 12PLY (GAUZE/BANDAGES/DRESSINGS) ×2 IMPLANT
STOCKINETTE 4X48 STRL (DRAPES) ×2 IMPLANT
SUT VICRYL RAPID 5 0 P 3 (SUTURE) IMPLANT
SUT VICRYL RAPIDE 4/0 PS 2 (SUTURE) ×2 IMPLANT
SWAB CULTURE LIQ STUART DBL (MISCELLANEOUS) ×1 IMPLANT
SYR BULB 3OZ (MISCELLANEOUS) ×2 IMPLANT
SYR CONTROL 10ML LL (SYRINGE) ×1 IMPLANT
TOWEL OR 17X24 6PK STRL BLUE (TOWEL DISPOSABLE) ×2 IMPLANT
TUBE ANAEROBIC SPECIMEN COL (MISCELLANEOUS) ×1 IMPLANT
TUBE FEEDING 5FR 15 INCH (TUBING) IMPLANT
UNDERPAD 30X30 INCONTINENT (UNDERPADS AND DIAPERS) ×2 IMPLANT
WATER STERILE IRR 1000ML POUR (IV SOLUTION) ×2 IMPLANT

## 2012-03-25 NOTE — Anesthesia Postprocedure Evaluation (Signed)
  Anesthesia Post-op Note  Patient: Samantha Stone  Procedure(s) Performed: Procedure(s) (LRB): INCISION AND DRAINAGE ABSCESS (Right)  Patient Location: PACU  Anesthesia Type: General  Level of Consciousness: awake and alert   Airway and Oxygen Therapy: Patient Spontanous Breathing  Post-op Pain: mild  Post-op Assessment: Post-op Vital signs reviewed, Patient's Cardiovascular Status Stable, Respiratory Function Stable, Patent Airway, No signs of Nausea or vomiting and Pain level controlled  Post-op Vital Signs: stable  Complications: No apparent anesthesia complications

## 2012-03-25 NOTE — H&P (Signed)
Samantha Stone is a 78 year old female who has been treated by Dr. Teressa Senter for a dog bite abscess on her right little finger open fracture of the distal phalanx. This was incised. The dog bite occurred on 03-03-12. She was doing well but has had progressive swelling and continued discharge. She has been on Augmentin. She has been in a splint. Her I&D was done on 03-11-12.   PAST MEDICAL HISTORY: She has no known drug allergies. She is on thyroid medication, BP medication and cholesterol medication. She has had bilateral total knees.   FAMILY H ISTORY: Negative.  SOCIAL HISTORY: She does not smoke. She drinks socially. She is married and a retired Charity fundraiser.  REVIEW OF SYSTEMS: Positive for glasses, otherwise negative for 14 points. Samantha Stone is an 78 y.o. female.   Chief Complaint: osteomyelitis RLF HPI: see above  Past Medical History  Diagnosis Date  . Hypertension   . Hypothyroidism   . Arthritis     Past Surgical History  Procedure Date  . I&d extremity 03/11/2012    Procedure: MINOR IRRIGATION AND DEBRIDEMENT EXTREMITY;  Surgeon: Wyn Forster., MD;  Location: [facility];  Service: Orthopedics;  Laterality: Right;  Right little finger  . Tonsillectomy   . Appendectomy   . Abdominal hysterectomy   . Trigger finger release   . Knee arthroscopy     bilat   . Replacement total knee bilateral   . Carpal tunnel release     rt x2  . Tubal ligation   . Colonoscopy     History reviewed. No pertinent family history. Social History:  reports that she has never smoked. She does not have any smokeless tobacco history on file. She reports that she drinks alcohol. Her drug history not on file.  Allergies: No Known Allergies  Medications Prior to Admission  Medication Sig Dispense Refill  . amoxicillin-clavulanate (AUGMENTIN) 500-125 MG per tablet Take 1 tablet by mouth 3 (three) times daily.      Marland Kitchen aspirin 81 MG tablet Take 81 mg by mouth daily.      Marland Kitchen  atenolol-chlorthalidone (TENORETIC) 100-25 MG per tablet Take 1 tablet by mouth daily.      . eszopiclone (LUNESTA) 2 MG TABS Take 2 mg by mouth at bedtime. Take immediately before bedtime      . folic acid (FOLVITE) 1 MG tablet Take 1 mg by mouth daily.      Marland Kitchen levothyroxine (SYNTHROID, LEVOTHROID) 137 MCG tablet Take 137 mcg by mouth daily.      . naproxen sodium (ANAPROX) 220 MG tablet Take 220 mg by mouth 2 (two) times daily with a meal.      . oxyCODONE-acetaminophen (PERCOCET/ROXICET) 5-325 MG per tablet 1 tab every 4 hours as needed for pain  24 tablet  0  . pravastatin (PRAVACHOL) 10 MG tablet Take 10 mg by mouth daily.      . vitamin C (ASCORBIC ACID) 500 MG tablet Take 500 mg by mouth daily.        Results for orders placed during the hospital encounter of 03/25/12 (from the past 48 hour(s))  BASIC METABOLIC PANEL     Status: Abnormal   Collection Time   03/24/12 12:40 PM      Component Value Range Comment   Sodium 136  135 - 145 mEq/L    Potassium 4.1  3.5 - 5.1 mEq/L    Chloride 97  96 - 112 mEq/L    CO2 30  19 - 32 mEq/L    Glucose, Bld 98  70 - 99 mg/dL    BUN 13  6 - 23 mg/dL    Creatinine, Ser 1.61  0.50 - 1.10 mg/dL    Calcium 9.6  8.4 - 09.6 mg/dL    GFR calc non Af Amer 61 (*) >90 mL/min    GFR calc Af Amer 71 (*) >90 mL/min     No results found.   Pertinent items are noted in HPI.  Blood pressure 146/73, pulse 51, temperature 97.4 F (36.3 C), temperature source Oral, resp. rate 20, height 5\' 6"  (1.676 m), weight 250 lb (113.399 kg), SpO2 98.00%.  General appearance: alert, cooperative and appears stated age Head: Normocephalic, without obvious abnormality Neck: no adenopathy Resp: clear to auscultation bilaterally Cardio: regular rate and rhythm, S1, S2 normal, no murmur, click, rub or gallop GI: soft, non-tender; bowel sounds normal; no masses,  no organomegaly Extremities: extremities normal, atraumatic, no cyanosis or edema Pulses: 2+ and  symmetric Skin: Skin color, texture, turgor normal. No rashes or lesions Neurologic: Grossly normal Incision/Wound: Drainage rlf tip  Assessment/Plan X-rays reveal destruction of the distal phalanx tuft area.  Plan is for re-incision and drainage and packing. Samantha Stone has requested amputation of the finger distal phalanx in an effort to cure the infection and protect her total knee arthroplasties from the possibility of seeding and the resultant infection.  Plan is amputation distal phalanRLF  Samantha Stone 03/25/2012, 9:27 AM

## 2012-03-25 NOTE — Anesthesia Preprocedure Evaluation (Signed)
Anesthesia Evaluation  Patient identified by MRN, date of birth, ID band Patient awake    Reviewed: Allergy & Precautions, H&P , NPO status , Patient's Chart, lab work & pertinent test results  Airway Mallampati: II TM Distance: >3 FB Neck ROM: Full    Dental   Pulmonary    Pulmonary exam normal       Cardiovascular hypertension,     Neuro/Psych    GI/Hepatic   Endo/Other  Hypothyroidism Morbid obesity  Renal/GU      Musculoskeletal   Abdominal (+) + obese,   Peds  Hematology   Anesthesia Other Findings   Reproductive/Obstetrics                           Anesthesia Physical Anesthesia Plan  ASA: III  Anesthesia Plan: General   Post-op Pain Management:    Induction: Intravenous  Airway Management Planned: LMA  Additional Equipment:   Intra-op Plan:   Post-operative Plan: Extubation in OR  Informed Consent: I have reviewed the patients History and Physical, chart, labs and discussed the procedure including the risks, benefits and alternatives for the proposed anesthesia with the patient or authorized representative who has indicated his/her understanding and acceptance.     Plan Discussed with: CRNA and Surgeon  Anesthesia Plan Comments:         Anesthesia Quick Evaluation

## 2012-03-25 NOTE — Anesthesia Procedure Notes (Signed)
Procedure Name: LMA Insertion Date/Time: 03/25/2012 10:27 AM Performed by: Burna Cash Pre-anesthesia Checklist: Patient identified, Emergency Drugs available, Suction available and Patient being monitored Patient Re-evaluated:Patient Re-evaluated prior to inductionOxygen Delivery Method: Circle System Utilized Preoxygenation: Pre-oxygenation with 100% oxygen Intubation Type: IV induction Ventilation: Mask ventilation without difficulty LMA: LMA with gastric port inserted LMA Size: 4.0 Number of attempts: 1 Placement Confirmation: positive ETCO2 Tube secured with: Tape Dental Injury: Teeth and Oropharynx as per pre-operative assessment

## 2012-03-25 NOTE — Op Note (Signed)
Dictated number: [id number]

## 2012-03-25 NOTE — Transfer of Care (Signed)
Immediate Anesthesia Transfer of Care Note  Patient: Samantha Stone  Procedure(s) Performed: Procedure(s) (LRB): INCISION AND DRAINAGE ABSCESS (Right)  Patient Location: PACU  Anesthesia Type: General  Level of Consciousness: sedated  Airway & Oxygen Therapy: Patient Spontanous Breathing and Patient connected to face mask oxygen  Post-op Assessment: Report given to PACU RN and Post -op Vital signs reviewed and stable  Post vital signs: Reviewed and stable  Complications: No apparent anesthesia complications

## 2012-03-25 NOTE — Brief Op Note (Signed)
03/25/2012  10:54 AM  PATIENT:  Samantha Stone  78 y.o. female  PRE-OPERATIVE DIAGNOSIS:  osteomyletis right small finger  POST-OPERATIVE DIAGNOSIS:  same as preop  PROCEDURE:  Procedure(s) (LRB): Amputation right small finger (Right)  SURGEON:  Surgeon(s) and Role:    * Nicki Reaper, MD - Primary  PHYSICIAN ASSISTANT:   ASSISTANTS: none   ANESTHESIA:   local and general  EBL:  Total I/O In: 800 [I.V.:800] Out: -   BLOOD ADMINISTERED:none  DRAINS: none   LOCAL MEDICATIONS USED:  MARCAINE     SPECIMEN:  Excision  DISPOSITION OF SPECIMEN:  PATHOLOGY  COUNTS:  YES  TOURNIQUET:   Total Tourniquet Time Documented: Forearm (Right) - 15 minutes  DICTATION: .Other Dictation: Dictation Number [id number]  PLAN OF CARE: Discharge to home after PACU  PATIENT DISPOSITION:  PACU - hemodynamically stable.

## 2012-03-26 LAB — POCT HEMOGLOBIN-HEMACUE: Hemoglobin: 13.3 g/dL (ref 12.0–15.0)

## 2012-03-26 NOTE — Op Note (Signed)
Samantha Stone, Samantha Stone              ACCOUNT NO.:  [id number]  MEDICAL RECORD NO.:  [id number]  LOCATION:                                 FACILITY:  PHYSICIAN:  Cindee Salt, M.D.            DATE OF BIRTH:  DATE OF PROCEDURE:  03/25/2012 DATE OF DISCHARGE:                              OPERATIVE REPORT   PREOPERATIVE DIAGNOSIS:  Osteomyelitis, distal phalanx, right little finger.  POSTOPERATIVE DIAGNOSIS:  Osteomyelitis, distal phalanx, right little finger.  OPERATION:  Amputation of distal phalanx, right little finger.  SURGEON:  Cindee Salt, MD  ANESTHESIA:  General with metacarpal block.  ANESTHESIOLOGIST:  Bedelia Person, MD  HISTORY:  The patient is a 78 year old female who suffered a dog bite approximately 2 weeks ago.  She has undergone IV treatment after incision and drainage.  She grew out Pasteurella multocida, has been on Augmentin.  She has developed osteomyelitis of the distal phalanx with destruction, drainage from the incision area over the past weekend.  She has total knee replacement, was admitted for incision and drainage, continuation of antibiotic, reculturing.  In the preoperative area, she stated that she was preferred to have the tip amputated in an effort to try to protect the total knee replacement, which she had from being seated by the infection and has requested amputation.  As such, the procedure was trained from incision and drainage, the amputation.  She is aware of risks and complications including infection, recurrence, continuation of problems, possibility of further intervention being necessary.  The extremity marked by both the patient and surgeon.  PROCEDURE:  The patient was brought to the operating room where a general anesthetic was carried out without difficulty.  She was prepped using ChloraPrep, supine position with the right arm free.  A 3-minute dry time was allowed.  She was draped.  The limb was exsanguinated from the wrist proximally  with an Esmarch bandage.  Tourniquet was placed on the forearm, was inflated to 250 mmHg.  A Penrose drain tourniquet was then used at the base of the finger.  A curvilinear incision, dorsal palmar flap was then created keeping this out of the area of initial injury.  There was no erythema to the tissue.  Incisions were made, carried down, bleeders were electrocauterized with bipolar.  The neurovascular bundles were identified.  The nerves were coagulated, brought into traction and cut and allowed to retract.  The digital arteries were each identified, these were electrically coagulated with bipolar.  The flexor tendon was cut, no significant fluid was encountered.  The extensor tendon was cut along with collateral ligaments and the distal phalanx was removed en bloc.  The cartilage was then removed and the tip of the middle phalanx was then smoothed due to the osteophytes and arthritic changes.  The wound was then copiously irrigated with saline.  The Penrose drain tourniquet was removed.  The blood was milked distally to remove any blood from the finger and the skin edges were very loosely reapproximated with 4-0 nylon sutures.  The finger tip was then incised.  A purulent material was immediately encountered.  Cultures were taken for both aerobic and anaerobic  cultures.  Prior to deflation of the tourniquet, she was given a 1.5 of vancomycin.  She tolerated the procedure well.  A sterile compressive dressing applied and she was taken to the recovery room for observation.          ______________________________ Cindee Salt, M.D.     GK/MEDQ  D:  03/25/2012  T:  03/26/2012  Job:  [id number]

## 2012-03-28 LAB — CULTURE, ROUTINE-ABSCESS: Culture: NO GROWTH

## 2012-03-30 LAB — ANAEROBIC CULTURE

## 2013-03-04 ENCOUNTER — Other Ambulatory Visit: Payer: Self-pay | Admitting: Oncology

## 2013-10-20 ENCOUNTER — Other Ambulatory Visit: Payer: Self-pay | Admitting: Neurological Surgery

## 2013-10-20 DIAGNOSIS — S060X9A Concussion with loss of consciousness of unspecified duration, initial encounter: Secondary | ICD-10-CM

## 2013-10-22 ENCOUNTER — Emergency Department (HOSPITAL_BASED_OUTPATIENT_CLINIC_OR_DEPARTMENT_OTHER): Payer: Medicare Other

## 2013-10-22 ENCOUNTER — Emergency Department (HOSPITAL_BASED_OUTPATIENT_CLINIC_OR_DEPARTMENT_OTHER)
Admission: EM | Admit: 2013-10-22 | Discharge: 2013-10-23 | Disposition: A | Discharge status: Home / Self Care | Payer: Medicare Other | Source: Home / Self Care | Attending: Emergency Medicine | Admitting: Emergency Medicine

## 2013-10-22 ENCOUNTER — Encounter (HOSPITAL_BASED_OUTPATIENT_CLINIC_OR_DEPARTMENT_OTHER): Payer: Self-pay | Admitting: Emergency Medicine

## 2013-10-22 DIAGNOSIS — Z79899 Other long term (current) drug therapy: Secondary | ICD-10-CM

## 2013-10-22 DIAGNOSIS — E079 Disorder of thyroid, unspecified: Secondary | ICD-10-CM

## 2013-10-22 DIAGNOSIS — I10 Essential (primary) hypertension: Secondary | ICD-10-CM

## 2013-10-22 DIAGNOSIS — F0781 Postconcussional syndrome: Secondary | ICD-10-CM

## 2013-10-22 DIAGNOSIS — M129 Arthropathy, unspecified: Secondary | ICD-10-CM | POA: Insufficient documentation

## 2013-10-22 DIAGNOSIS — E039 Hypothyroidism, unspecified: Secondary | ICD-10-CM | POA: Insufficient documentation

## 2013-10-22 DIAGNOSIS — Z7982 Long term (current) use of aspirin: Secondary | ICD-10-CM

## 2013-10-22 DIAGNOSIS — R51 Headache: Principal | ICD-10-CM | POA: Insufficient documentation

## 2013-10-22 DIAGNOSIS — Z792 Long term (current) use of antibiotics: Secondary | ICD-10-CM

## 2013-10-22 NOTE — ED Provider Notes (Signed)
CSN: [id number]     Arrival date & time 10/22/13  2234 History   First MD Initiated Contact with Patient 10/22/13 2307     Chief Complaint  Patient presents with  . Headache     (Consider location/radiation/quality/duration/timing/severity/associated sxs/prior Treatment) HPI This is an 80 year old female who fell and hit the right side of her head in [place] 3 weeks ago. She had a CT scan at that time that was unremarkable. She has subsequently had severe headaches on the right side of her head. She is seeing her primary care physician as well as Dr. Ellene Route of neurosurgery, who has ordered a repeat CT scan of the head; the CT is pending due to insurance. She has had some difficulty with memory and simple tasks. As an example, the day after her fall she couldn't remember how to make a grilled cheese sandwich. She continues to have similar lapses but is aware of these lapses. She has not had any focal neurologic deficit. She has had some mild nausea but has not taken anything for it currently. Her appetite has been somewhat decreased. She has not been vomiting. She denies pain other than the right side of her head. She has not had any difficulty walking.  Past Medical History  Diagnosis Date  . Hypertension   . Hypothyroidism   . Arthritis    Past Surgical History  Procedure Laterality Date  . I&d extremity  03/11/2012    Procedure: MINOR IRRIGATION AND DEBRIDEMENT EXTREMITY;  Surgeon: Cammie Sickle., MD;  Location: [place];  Service: Orthopedics;  Laterality: Right;  Right little finger  . Tonsillectomy    . Appendectomy    . Abdominal hysterectomy    . Trigger finger release    . Knee arthroscopy      bilat   . Replacement total knee bilateral    . Carpal tunnel release      rt x2  . Tubal ligation    . Colonoscopy     No family history on file. History  Substance Use Topics  . Smoking status: Never Smoker   . Smokeless tobacco: Not on file  . Alcohol  Use: Yes     Comment: occ   OB History   Grav Para Term Preterm Abortions TAB SAB Ect Mult Living                 Review of Systems  All other systems reviewed and are negative.   Allergies  Phenergan  Home Medications   Current Outpatient Rx  Name  Route  Sig  Dispense  Refill  . amoxicillin-clavulanate (AUGMENTIN) 500-125 MG per tablet   Oral   Take 1 tablet by mouth 3 (three) times daily.         Marland Kitchen aspirin 81 MG tablet   Oral   Take 81 mg by mouth daily.         Marland Kitchen atenolol-chlorthalidone (TENORETIC) 100-25 MG per tablet   Oral   Take 1 tablet by mouth daily.         . eszopiclone (LUNESTA) 2 MG TABS   Oral   Take 2 mg by mouth at bedtime. Take immediately before bedtime         . folic acid (FOLVITE) 1 MG tablet   Oral   Take 1 mg by mouth daily.         Marland Kitchen levothyroxine (SYNTHROID, LEVOTHROID) 137 MCG tablet   Oral   Take 137 mcg by mouth  daily.         . naproxen sodium (ANAPROX) 220 MG tablet   Oral   Take 220 mg by mouth 2 (two) times daily with a meal.         . oxyCODONE-acetaminophen (PERCOCET/ROXICET) 5-325 MG per tablet      1 tab every 4 hours as needed for pain   24 tablet   0   . pravastatin (PRAVACHOL) 10 MG tablet   Oral   Take 10 mg by mouth daily.         . vitamin C (ASCORBIC ACID) 500 MG tablet   Oral   Take 500 mg by mouth daily.          BP 193/69  Pulse 60  Temp(Src) 97.5 F (36.4 C) (Oral)  Resp 20  Ht 5\' 6"  (1.676 m)  Wt 250 lb (113.399 kg)  BMI 40.37 kg/m2  SpO2 100%  Physical Exam General: Well-developed, well-nourished female in no acute distress; appearance consistent with age of record HENT: normocephalic; atraumatic Eyes: pupils equal, round and reactive to light; extraocular muscles intact Neck: supple Heart: regular rate and rhythm Lungs: clear to auscultation bilaterally Abdomen: soft; nondistended; nontender; bowel sounds present Extremities: No deformity; full range of motion; pulses  normal Neurologic: Awake, alert and oriented x 4; motor function intact in all extremities and symmetric; no facial droop; normal coordination speech; negative Romberg; normal finger to nose Skin: Warm and dry Psychiatric: Normal mood and affect    ED Course  Procedures (including critical care time)   MDM  Nursing notes and vitals signs, including pulse oximetry, reviewed.  Summary of this visit's results, reviewed by myself:  Labs:  No results found for this or any previous visit (from the past 24 hour(s)).  Imaging Studies: Ct Head Wo Contrast  10/23/2013   CLINICAL DATA:  Fall, hit right side of head  EXAM: CT HEAD WITHOUT CONTRAST  TECHNIQUE: Contiguous axial images were obtained from the base of the skull through the vertex without intravenous contrast.  COMPARISON:  None.  FINDINGS: Scattered and confluent hypodensity within the periventricular and deep white matter both cerebral hemispheres is compatible with moderate chronic microvascular ischemic disease. Mild age-related atrophy is present.  There is no acute intracranial hemorrhage or infarct. No mass lesion or midline shift. Gray-white matter differentiation is well maintained. Ventricles are normal in size without evidence of hydrocephalus.  The calvarium is intact.  Orbital soft tissues are within normal limits.  Circumferential opacity present within the left sphenoid sinus. Paranasal sinuses are otherwise largely clear. No mastoid effusion.  Scalp soft tissues are unremarkable.  IMPRESSION: 1. No acute intracranial process. 2. Mild atrophy with moderate chronic microvascular ischemic changes. 3. Less sphenoid sinus disease.   Electronically Signed   By: Jeannine Boga M.D.   On: 10/23/2013 00:20        Wynetta Fines, MD 10/23/13 0030

## 2013-10-22 NOTE — ED Notes (Signed)
Headache. She fell 3 weeks ago. Was seen in [place] and had a negative CT scan. Her MD told her she had a contusion of her brain. Her MD plans to repeat her CT this week.

## 2013-10-22 NOTE — ED Notes (Signed)
Pt stated that she has been evaluated by dr. Ellene Route since fall  In [place], he is a personal friend. Pt states that she was trying to get scheduled for a follow up ct, but it has been delayed due to needing insurance approval. Pt also reports taking a hydrocodone this pm at 2030 for pain, with no improvement

## 2013-10-23 ENCOUNTER — Encounter (HOSPITAL_BASED_OUTPATIENT_CLINIC_OR_DEPARTMENT_OTHER): Payer: Self-pay | Admitting: Emergency Medicine

## 2013-10-23 ENCOUNTER — Observation Stay (HOSPITAL_BASED_OUTPATIENT_CLINIC_OR_DEPARTMENT_OTHER)
Admission: EM | Admit: 2013-10-23 | Discharge: 2013-10-25 | Disposition: A | Discharge status: Home / Self Care | Payer: Medicare Other | Attending: Internal Medicine | Admitting: Internal Medicine

## 2013-10-23 ENCOUNTER — Emergency Department (HOSPITAL_BASED_OUTPATIENT_CLINIC_OR_DEPARTMENT_OTHER): Payer: Medicare Other

## 2013-10-23 DIAGNOSIS — R4701 Aphasia: Secondary | ICD-10-CM | POA: Diagnosis present

## 2013-10-23 DIAGNOSIS — I10 Essential (primary) hypertension: Secondary | ICD-10-CM | POA: Diagnosis present

## 2013-10-23 DIAGNOSIS — G459 Transient cerebral ischemic attack, unspecified: Secondary | ICD-10-CM

## 2013-10-23 LAB — COMPREHENSIVE METABOLIC PANEL
ALT: 20 U/L (ref 0–35)
AST: 23 U/L (ref 0–37)
Albumin: 4.1 g/dL (ref 3.5–5.2)
Alkaline Phosphatase: 66 U/L (ref 39–117)
BUN: 16 mg/dL (ref 6–23)
CO2: 29 mEq/L (ref 19–32)
Calcium: 9.8 mg/dL (ref 8.4–10.5)
Chloride: 98 mEq/L (ref 96–112)
Creatinine, Ser: 0.9 mg/dL (ref 0.50–1.10)
GFR calc Af Amer: 68 mL/min — ABNORMAL LOW (ref >90)
GFR calc non Af Amer: 59 mL/min — ABNORMAL LOW (ref >90)
Glucose, Bld: 115 mg/dL — ABNORMAL HIGH (ref 70–99)
Potassium: 4.5 mEq/L (ref 3.7–5.3)
Sodium: 137 mEq/L (ref 137–147)
Total Bilirubin: 0.5 mg/dL (ref 0.3–1.2)
Total Protein: 7.3 g/dL (ref 6.0–8.3)

## 2013-10-23 LAB — URINALYSIS, ROUTINE W REFLEX MICROSCOPIC
Bilirubin Urine: NEGATIVE
Glucose, UA: NEGATIVE mg/dL
Hgb urine dipstick: NEGATIVE
Ketones, ur: NEGATIVE mg/dL
Leukocytes, UA: NEGATIVE
Nitrite: NEGATIVE
Protein, ur: NEGATIVE mg/dL
Specific Gravity, Urine: 1.023 (ref 1.005–1.030)
Urobilinogen, UA: 0.2 mg/dL (ref 0.0–1.0)
pH: 7.5 (ref 5.0–8.0)

## 2013-10-23 LAB — PROTIME-INR
INR: 1.04 (ref 0.00–1.49)
Prothrombin Time: 13.4 seconds (ref 11.6–15.2)

## 2013-10-23 LAB — CBC WITH DIFFERENTIAL/PLATELET
Basophils Absolute: 0 10*3/uL (ref 0.0–0.1)
Basophils Relative: 0 % (ref 0–1)
Eosinophils Absolute: 0.1 10*3/uL (ref 0.0–0.7)
Eosinophils Relative: 2 % (ref 0–5)
HCT: 41.9 % (ref 36.0–46.0)
Hemoglobin: 14.3 g/dL (ref 12.0–15.0)
Lymphocytes Relative: 24 % (ref 12–46)
Lymphs Abs: 1.9 10*3/uL (ref 0.7–4.0)
MCH: 29.4 pg (ref 26.0–34.0)
MCHC: 34.1 g/dL (ref 30.0–36.0)
MCV: 86.2 fL (ref 78.0–100.0)
Monocytes Absolute: 0.9 10*3/uL (ref 0.1–1.0)
Monocytes Relative: 11 % (ref 3–12)
Neutro Abs: 5 10*3/uL (ref 1.7–7.7)
Neutrophils Relative %: 63 % (ref 43–77)
Platelets: 274 10*3/uL (ref 150–400)
RBC: 4.86 MIL/uL (ref 3.87–5.11)
RDW: 13.8 % (ref 11.5–15.5)
WBC: 7.9 10*3/uL (ref 4.0–10.5)

## 2013-10-23 LAB — TROPONIN I: Troponin I: <0.3 ng/mL (ref <0.30)

## 2013-10-23 MED ORDER — NAPROXEN 250 MG PO TABS
500.0000 mg | ORAL_TABLET | Freq: Once | ORAL | Status: AC
Start: 2013-10-23 — End: 2013-10-23
  Administered 2013-10-23: 500 mg via ORAL
  Filled 2013-10-23: qty 2

## 2013-10-23 NOTE — ED Notes (Signed)
Headache and hypertension. Confusion. She was seen yesterday for same.

## 2013-10-23 NOTE — Discharge Instructions (Signed)

## 2013-10-23 NOTE — ED Notes (Signed)
Report given to Tori RN on [facility] for room 14

## 2013-10-23 NOTE — ED Provider Notes (Signed)
CSN: [id number]     Arrival date & time 10/23/13  2039 History  This chart was scribed for Veryl Speak, MD by Maree Erie, ED Scribe. The patient was seen in room MH07/MH07. Patient's care was started at 9:12 PM.      Chief Complaint  Patient presents with  . Headache  . Hypertension      Patient is a 80 y.o. female presenting with hypertension. The history is provided by the patient. No language interpreter was used.  Hypertension    HPI Comments: GREENLY RARICK is a 80 y.o. female who presents to the Emergency Department complaining of an episode of expressive aphasia that occurred three hours ago. She reports a similar, less severe episode of aphasia that occurred last night prior to being seen in the ED. However, she was seen in the ED last night due to severe, right sided headaches that have been persistent since she fell and hit her head on a night table three weeks ago in [place]. She had a CT scan of the head performed at that time that was unremarkable. She was seen by Dr. Ellene Route, a Neurosurgeon in [place] on 3/20 and had a repeat CT ordered that was pending due to insurance issues. The CT last night was also unremarkable so she was discharged. She reports that she was doing well today until the episode occurred. She also checked her BP prior to arrival and states it was 187/70. She reports a baseline BP in the 093A systolic. Her daughters-in-law state that she has been having intermittent episodes of confusion since the fall occurred three weeks ago. She denies fever, cough, congestion, leg swelling, numbness or tingling in her extremities. She denies a history of cardiac issues. She denies taking anticoagulants. She states that she typically drinks two to three beers a day but has not had any alcohol in a few days.    Past Medical History  Diagnosis Date  . Hypertension   . Hypothyroidism   . Arthritis    Past Surgical History  Procedure Laterality Date  . I&d  extremity  03/11/2012    Procedure: MINOR IRRIGATION AND DEBRIDEMENT EXTREMITY;  Surgeon: Cammie Sickle., MD;  Location: [facility];  Service: Orthopedics;  Laterality: Right;  Right little finger  . Tonsillectomy    . Appendectomy    . Abdominal hysterectomy    . Trigger finger release    . Knee arthroscopy      bilat   . Replacement total knee bilateral    . Carpal tunnel release      rt x2  . Tubal ligation    . Colonoscopy     No family history on file. History  Substance Use Topics  . Smoking status: Never Smoker   . Smokeless tobacco: Not on file  . Alcohol Use: Yes     Comment: occ   OB History   Grav Para Term Preterm Abortions TAB SAB Ect Mult Living                 Review of Systems A complete 10 system review of systems was obtained and all systems are negative except as noted in the HPI and PMH.     Allergies  Phenergan  Home Medications   Current Outpatient Rx  Name  Route  Sig  Dispense  Refill  . amoxicillin-clavulanate (AUGMENTIN) 500-125 MG per tablet   Oral   Take 1 tablet by mouth 3 (three) times daily.         Marland Kitchen  aspirin 81 MG tablet   Oral   Take 81 mg by mouth daily.         Marland Kitchen atenolol-chlorthalidone (TENORETIC) 100-25 MG per tablet   Oral   Take 1 tablet by mouth daily.         . eszopiclone (LUNESTA) 2 MG TABS   Oral   Take 2 mg by mouth at bedtime. Take immediately before bedtime         . folic acid (FOLVITE) 1 MG tablet   Oral   Take 1 mg by mouth daily.         Marland Kitchen levothyroxine (SYNTHROID, LEVOTHROID) 137 MCG tablet   Oral   Take 137 mcg by mouth daily.         . naproxen sodium (ANAPROX) 220 MG tablet   Oral   Take 220 mg by mouth 2 (two) times daily with a meal.         . oxyCODONE-acetaminophen (PERCOCET/ROXICET) 5-325 MG per tablet      1 tab every 4 hours as needed for pain   24 tablet   0   . pravastatin (PRAVACHOL) 10 MG tablet   Oral   Take 10 mg by mouth daily.         .  vitamin C (ASCORBIC ACID) 500 MG tablet   Oral   Take 500 mg by mouth daily.          Triage Vitals: BP 204/67  Pulse 59  Temp(Src) 97.7 F (36.5 C) (Oral)  Resp 20  Ht 5\' 6"  (1.676 m)  Wt 250 lb (113.399 kg)  BMI 40.37 kg/m2  SpO2 100%  Physical Exam  Nursing note and vitals reviewed. Constitutional: She is oriented to person, place, and time. She appears well-developed and well-nourished. No distress.  HENT:  Head: Normocephalic and atraumatic.  Mouth/Throat: Oropharynx is clear and moist.  Neck: Normal range of motion. Neck supple.  Cardiovascular: Normal rate and regular rhythm.  Exam reveals no gallop and no friction rub.   No murmur heard. Pulmonary/Chest: Effort normal and breath sounds normal. No respiratory distress. She has no wheezes. She has no rales.  Abdominal: Soft. She exhibits no distension. There is no tenderness. There is no rebound and no guarding.  Neurological: She is alert and oriented to person, place, and time. No cranial nerve deficit or sensory deficit. She exhibits normal muscle tone. Coordination normal.  Skin: Skin is warm and dry.  Psychiatric: She has a normal mood and affect.    ED Course  Procedures (including critical care time)  DIAGNOSTIC STUDIES: Oxygen Saturation is 100% on room air, normal by my interpretation.    COORDINATION OF CARE: 9:12 PM -Will order chest x-ray, EKG, UA, CBC, CMP, Protime-INR and Troponin I. Patient verbalizes understanding and agrees with treatment plan.    Labs Review Labs Reviewed  COMPREHENSIVE METABOLIC PANEL - Abnormal; Notable for the following:    Glucose, Bld 115 (*)    GFR calc non Af Amer 59 (*)    GFR calc Af Amer 68 (*)    All other components within normal limits  URINALYSIS, ROUTINE W REFLEX MICROSCOPIC - Abnormal; Notable for the following:    APPearance CLOUDY (*)    All other components within normal limits  CBC WITH DIFFERENTIAL  PROTIME-INR  TROPONIN I   Imaging Review Dg  Chest 2 View  10/23/2013   CLINICAL DATA:  Headache and hypertension.  EXAM: CHEST  2 VIEW  COMPARISON:  08/31/2003  FINDINGS: Mild cardiomegaly which is chronic. Unchanged mild tortuosity of the aorta. No infiltrate or edema. No effusion or pneumothorax. Peripherally calcified structure in the left upper quadrant measuring 13 mm. 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 Size is unchanged from 2005.  IMPRESSION: No active cardiopulmonary disease.   Electronically Signed   By: Jorje Guild M.D.   On: 10/23/2013 22:46   Ct Head Wo Contrast  10/23/2013   CLINICAL DATA:  Fall, hit right side of head  EXAM: CT HEAD WITHOUT CONTRAST  TECHNIQUE: Contiguous axial images were obtained from the base of the skull through the vertex without intravenous contrast.  COMPARISON:  None.  FINDINGS: Scattered and confluent hypodensity within the periventricular and deep white matter both cerebral hemispheres is compatible with moderate chronic microvascular ischemic disease. Mild age-related atrophy is present.  There is no acute intracranial hemorrhage or infarct. No mass lesion or midline shift. Gray-white matter differentiation is well maintained. Ventricles are normal in size without evidence of hydrocephalus.  The calvarium is intact.  Orbital soft tissues are within normal limits.  Circumferential opacity present within the left sphenoid sinus. Paranasal sinuses are otherwise largely clear. No mastoid effusion.  Scalp soft tissues are unremarkable.  IMPRESSION: 1. No acute intracranial process. 2. Mild atrophy with moderate chronic microvascular ischemic changes. 3. Less sphenoid sinus disease.   Electronically Signed   By: Jeannine Boga M.D.   On: 10/23/2013 00:20     EKG Interpretation   Date/Time:  Friday October 23 2013 21:50:53 EDT Ventricular Rate:  61 PR Interval:  152 QRS Duration: 78 QT Interval:  478 QTC Calculation: 481 R Axis:   52 Text  Interpretation:  Normal sinus rhythm Normal ECG Confirmed by DELOS   MD, Frenchie Pribyl ([id number]) on 10/23/2013 10:26:05 PM      MDM   Final diagnoses:  None    Patient is an 80 year old female who sustained a head injury 3 weeks ago. She's been having episodes the past 2 days of what sounds like an expressive aphasia. She had a head CT performed last night for similar symptoms which was negative. She had a similar a phasic episode tonight. By the time she got here this had resolved and she is now neurologically intact. Workup reveals unremarkable laboratory studies. I did not repeat the CT as she was back to baseline in this had been done 24 hours prior.  My concern is that this could potentially be transient ischemic episodes rather than a postconcussive syndrome. She was markedly hypertensive upon presentation which has improved without intervention. I've spoken with Dr. Doy Mince from neurology who agrees these may be episodes of TIA. I spoke with Dr.Doutova from [facility] who agrees to admit.  I personally performed the services described in this documentation, which was scribed in my presence. The recorded information has been reviewed and is accurate.      Veryl Speak, MD 10/23/13 2306

## 2013-10-24 ENCOUNTER — Observation Stay (HOSPITAL_COMMUNITY): Payer: Medicare Other

## 2013-10-24 ENCOUNTER — Encounter (HOSPITAL_COMMUNITY): Payer: Self-pay | Admitting: Internal Medicine

## 2013-10-24 DIAGNOSIS — I10 Essential (primary) hypertension: Secondary | ICD-10-CM

## 2013-10-24 DIAGNOSIS — I059 Rheumatic mitral valve disease, unspecified: Secondary | ICD-10-CM

## 2013-10-24 DIAGNOSIS — G459 Transient cerebral ischemic attack, unspecified: Secondary | ICD-10-CM

## 2013-10-24 DIAGNOSIS — R4701 Aphasia: Secondary | ICD-10-CM

## 2013-10-24 LAB — GLUCOSE, CAPILLARY
Glucose-Capillary: 99 mg/dL (ref 70–99)
Glucose-Capillary: 88 mg/dL (ref 70–99)
Glucose-Capillary: 90 mg/dL (ref 70–99)
Glucose-Capillary: 121 mg/dL — ABNORMAL HIGH (ref 70–99)

## 2013-10-24 LAB — LIPID PANEL
Cholesterol: 176 mg/dL (ref 0–200)
HDL: 44 mg/dL (ref >39)
LDL Cholesterol: 111 mg/dL — ABNORMAL HIGH (ref 0–99)
Total CHOL/HDL Ratio: 4 RATIO
Triglycerides: 105 mg/dL (ref <150)
VLDL: 21 mg/dL (ref 0–40)

## 2013-10-24 LAB — HEMOGLOBIN A1C
Hgb A1c MFr Bld: 5.7 % — ABNORMAL HIGH (ref <5.7)
Mean Plasma Glucose: 117 mg/dL — ABNORMAL HIGH (ref <117)

## 2013-10-24 MED ORDER — OXYCODONE-ACETAMINOPHEN 5-325 MG PO TABS: 1.0000 | ORAL_TABLET | Freq: Four times a day (QID) | ORAL | Status: DC | PRN

## 2013-10-24 MED ORDER — AMLODIPINE BESYLATE 2.5 MG PO TABS
2.5000 mg | ORAL_TABLET | Freq: Every day | ORAL | Status: DC
  Administered 2013-10-24 – 2013-10-25 (×2): 2.5 mg via ORAL
  Filled 2013-10-24 (×2): qty 1

## 2013-10-24 MED ORDER — ACETAMINOPHEN 325 MG PO TABS
650.0000 mg | ORAL_TABLET | ORAL | Status: DC | PRN
  Administered 2013-10-24: 650 mg via ORAL
  Filled 2013-10-24: qty 2

## 2013-10-24 MED ORDER — AMLODIPINE BESYLATE 2.5 MG PO TABS: 2.5000 mg | ORAL_TABLET | Freq: Every day | ORAL | Status: DC

## 2013-10-24 MED ORDER — SODIUM CHLORIDE 0.9 % IV SOLN
INTRAVENOUS | Status: AC
  Administered 2013-10-24: 04:00:00 via INTRAVENOUS

## 2013-10-24 MED ORDER — ALPRAZOLAM 0.5 MG PO TABS: 0.5000 mg | ORAL_TABLET | Freq: Once | ORAL | Status: AC | PRN

## 2013-10-24 MED ORDER — ATENOLOL 100 MG PO TABS
100.0000 mg | ORAL_TABLET | Freq: Every day | ORAL | Status: DC
  Administered 2013-10-24 – 2013-10-25 (×2): 100 mg via ORAL
  Filled 2013-10-24 (×2): qty 1

## 2013-10-24 MED ORDER — NON FORMULARY: Freq: Once | Status: DC

## 2013-10-24 MED ORDER — SIMVASTATIN 5 MG PO TABS
5.0000 mg | ORAL_TABLET | Freq: Every day | ORAL | Status: DC
  Administered 2013-10-24: 5 mg via ORAL
  Filled 2013-10-24 (×2): qty 1

## 2013-10-24 MED ORDER — ASPIRIN 325 MG PO TABS
325.0000 mg | ORAL_TABLET | Freq: Every day | ORAL | Status: DC
  Administered 2013-10-24 – 2013-10-25 (×2): 325 mg via ORAL
  Filled 2013-10-24 (×2): qty 1

## 2013-10-24 MED ORDER — ESZOPICLONE 2 MG PO TABS
2.0000 mg | ORAL_TABLET | Freq: Once | ORAL | Status: AC
  Administered 2013-10-24: 2 mg via ORAL

## 2013-10-24 MED ORDER — ALPRAZOLAM 0.5 MG PO TABS: 0.5000 mg | ORAL_TABLET | Freq: Two times a day (BID) | ORAL | Status: DC | PRN

## 2013-10-24 MED ORDER — LORAZEPAM 2 MG/ML IJ SOLN
1.0000 mg | Freq: Once | INTRAMUSCULAR | Status: AC
  Administered 2013-10-24: 1 mg via INTRAVENOUS
  Filled 2013-10-24: qty 1

## 2013-10-24 MED ORDER — ZOLPIDEM TARTRATE 5 MG PO TABS: 5.0000 mg | ORAL_TABLET | Freq: Every evening | ORAL | Status: DC | PRN

## 2013-10-24 MED ORDER — LEVOTHYROXINE SODIUM 137 MCG PO TABS
137.0000 ug | ORAL_TABLET | Freq: Every day | ORAL | Status: DC
Start: 2013-10-24 — End: 2013-10-25
  Administered 2013-10-24 – 2013-10-25 (×2): 137 ug via ORAL
  Filled 2013-10-24 (×3): qty 1

## 2013-10-24 NOTE — Progress Notes (Signed)
Utilization review completed.  P.J. Ishi Danser,RN,BSN Case Manager 

## 2013-10-24 NOTE — H&P (Signed)
PCP:  Abigail Miyamoto, MD    Chief Complaint:  aphasia   HPI: Samantha Stone is a 80 y.o. female   has a past medical history of Hypertension; Hypothyroidism; and Arthritis.   Presented with  3 weeks ago patient fell down hitting her head she have had a head ache and presented to ER and was found to be dehydrated with low potassium. She was given phenergan and started to hallucinate.  She has had a persistent headache for the past few weeks. Last night she noted trouble speaking and presented to Er her symptoms have resolved and she had a CT scan done that was unremarkable. 3/27 her symptoms have recurred around 6 pm. She was crocheting and started to noted she could not follow the steps, She started to slurred her words and started to drop objects  Out of her right hand. In the next 60-90  mnutes this had resolved. She was brought to [facility] and was transferred to [facility] for further evaluation. Patient is Left handed.   Review of Systems:     Pertinent positives include: confusion, slurred speech,   Constitutional:  No weight loss, night sweats, Fevers, chills, fatigue, weight loss  HEENT:  No headaches, Difficulty swallowing,Tooth/dental problems,Sore throat,  No sneezing, itching, ear ache, nasal congestion, post nasal drip,  Cardio-vascular:  No chest pain, Orthopnea, PND, anasarca, dizziness, palpitations.no Bilateral lower extremity swelling  GI:  No heartburn, indigestion, abdominal pain, nausea, vomiting, diarrhea, change in bowel habits, loss of appetite, melena, blood in stool, hematemesis Resp:  no shortness of breath at rest. No dyspnea on exertion, No excess mucus, no productive cough, No non-productive cough, No coughing up of blood.No change in color of mucus.No wheezing. Skin:  no rash or lesions. No jaundice GU:  no dysuria, change in color of urine, no urgency or frequency. No straining to urinate.  No flank pain.  Musculoskeletal:  No joint pain or no joint swelling.  No decreased range of motion. No back pain.  Psych:  No change in mood or affect. No depression or anxiety. No memory loss.  Neuro: no localizing neurological complaints, no tingling, no weakness, no double vision, no gait abnormality, no    Otherwise ROS are negative except for above, 10 systems were reviewed  Past Medical History: Past Medical History  Diagnosis Date  . Hypertension   . Hypothyroidism   . Arthritis    Past Surgical History  Procedure Laterality Date  . I&d extremity  03/11/2012    Procedure: MINOR IRRIGATION AND DEBRIDEMENT EXTREMITY;  Surgeon: Cammie Sickle., MD;  Location: [place];  Service: Orthopedics;  Laterality: Right;  Right little finger  . Tonsillectomy    . Appendectomy    . Abdominal hysterectomy    . Trigger finger release    . Knee arthroscopy      bilat   . Replacement total knee bilateral    . Carpal tunnel release      rt x2  . Tubal ligation    . Colonoscopy       Medications: Prior to Admission medications   Medication Sig Start Date End Date Taking? Authorizing Provider  atenolol (TENORMIN) 100 MG tablet Take 100 mg by mouth daily.   Yes Historical Provider, MD  aspirin 81 MG tablet Take 81 mg by mouth daily.    Historical Provider, MD  eszopiclone (LUNESTA) 2 MG TABS Take 2 mg by mouth at bedtime. Take immediately before bedtime  Historical Provider, MD  folic acid (FOLVITE) 1 MG tablet Take 1 mg by mouth daily.    Historical Provider, MD  levothyroxine (SYNTHROID, LEVOTHROID) 137 MCG tablet Take 137 mcg by mouth daily.    Historical Provider, MD  naproxen sodium (ANAPROX) 220 MG tablet Take 220 mg by mouth 2 (two) times daily with a meal.    Historical Provider, MD  oxyCODONE-acetaminophen (PERCOCET/ROXICET) 5-325 MG per tablet 1 tab every 4 hours as needed for pain 03/11/12   Marily Lente Dasnoit, PA-C  pravastatin (PRAVACHOL) 10 MG tablet Take 10 mg by mouth daily.    Historical Provider, MD  vitamin C (ASCORBIC  ACID) 500 MG tablet Take 500 mg by mouth daily.    Historical Provider, MD    Allergies:   Allergies  Allergen Reactions  . Phenergan [Promethazine Hcl]     hallucinations    Social History:  Ambulatory   independently   Lives at   Home with husband who has dementia   reports that she has never smoked. She does not have any smokeless tobacco history on file. She reports that she drinks alcohol. She reports that she does not use illicit drugs.   Family History: family history includes Bladder Cancer in her father.    Physical Exam: Patient Vitals for the past 24 hrs:  BP Temp Temp src Pulse Resp SpO2 Height Weight  10/24/13 0130 169/65 mmHg 98.1 F (36.7 C) Oral 65 18 96 % 5\' 6"  (1.676 m) 113.399 kg (250 lb)  10/23/13 2300 181/81 mmHg - - 65 - 100 % - -  10/23/13 2259 180/66 mmHg - - 62 - - - -  10/23/13 2043 204/67 mmHg 97.7 F (36.5 C) Oral 59 20 100 % 5\' 6"  (1.676 m) 113.399 kg (250 lb)    1. General:  in No Acute distress 2. Psychological: Alert and  Oriented 3. Head/ENT:   Moist  Mucous Membranes                          Head Non traumatic, neck supple                          Normal  Dentition 4. SKIN: normal  Skin turgor,  Skin clean Dry and intact no rash 5. Heart: Regular rate and rhythm no Murmur, Rub or gallop 6. Lungs: Clear to auscultation bilaterally, no wheezes or crackles   7. Abdomen: Soft, non-tender, Non distended obese 8. Lower extremities: no clubbing, cyanosis, or edema 9. Neurologically strength 5 out of 5 in all 4 extremities cranial nerves II through XII intact  10. MSK: Normal range of motion  body mass index is 40.37 kg/(m^2).   Labs on Admission:   Recent Labs  10/23/13 2159  NA 137  K 4.5  CL 98  CO2 29  GLUCOSE 115*  BUN 16  CREATININE 0.90  CALCIUM 9.8    Recent Labs  10/23/13 2159  AST 23  ALT 20  ALKPHOS 66  BILITOT 0.5  PROT 7.3  ALBUMIN 4.1   No results found for this basename: LIPASE, AMYLASE,  in the last 72  hours  Recent Labs  10/23/13 2159  WBC 7.9  NEUTROABS 5.0  HGB 14.3  HCT 41.9  MCV 86.2  PLT 274    Recent Labs  10/23/13 2159  TROPONINI <0.30   No results found for this basename: TSH, T4TOTAL, FREET3, T3FREE, THYROIDAB,  in  the last 72 hours No results found for this basename: VITAMINB12, FOLATE, FERRITIN, TIBC, IRON, RETICCTPCT,  in the last 72 hours No results found for this basename: HGBA1C    Estimated Creatinine Clearance: 63.7 ml/min (by C-G formula based on Cr of 0.9). ABG No results found for this basename: phart, pco2, po2, hco3, tco2, acidbasedef, o2sat     No results found for this basename: DDIMER     Other results:  I have pearsonaly reviewed this: ECG REPORT  Rate: 61  Rhythm:  NSR  ST&T Change: no ischemic changes   UA no evidence of infection    Cultures:    Component Value Date/Time   SDES ABSCESS HAND RIGHT 03/25/2012 [place] HAND RIGHT 03/25/2012 1041   SPECREQUEST RIGHT SMALL FINGER 03/25/2012 1041   SPECREQUEST RIGHT SMALL FINGER 03/25/2012 1041   CULT NO ANAEROBES ISOLATED 03/25/2012 1041   CULT NO GROWTH 3 DAYS 03/25/2012 1041   REPTSTATUS 03/30/2012 FINAL 03/25/2012 1041   REPTSTATUS 03/28/2012 FINAL 03/25/2012 1041       Radiological Exams on Admission: Dg Chest 2 View  10/23/2013   CLINICAL DATA:  Headache and hypertension.  EXAM: CHEST  2 VIEW  COMPARISON:  08/31/2003  FINDINGS: Mild cardiomegaly which is chronic. Unchanged mild tortuosity of the aorta. No infiltrate or edema. No effusion or pneumothorax. Peripherally calcified structure in the left upper quadrant measuring 13 mm. 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 Size is unchanged from 2005.  IMPRESSION: No active cardiopulmonary disease.   Electronically Signed   By: Jorje Guild M.D.   On: 10/23/2013 22:46   Ct Head Wo Contrast  10/23/2013   CLINICAL DATA:  Fall, hit right side of head  EXAM: CT HEAD WITHOUT  CONTRAST  TECHNIQUE: Contiguous axial images were obtained from the base of the skull through the vertex without intravenous contrast.  COMPARISON:  None.  FINDINGS: Scattered and confluent hypodensity within the periventricular and deep white matter both cerebral hemispheres is compatible with moderate chronic microvascular ischemic disease. Mild age-related atrophy is present.  There is no acute intracranial hemorrhage or infarct. No mass lesion or midline shift. Gray-white matter differentiation is well maintained. Ventricles are normal in size without evidence of hydrocephalus.  The calvarium is intact.  Orbital soft tissues are within normal limits.  Circumferential opacity present within the left sphenoid sinus. Paranasal sinuses are otherwise largely clear. No mastoid effusion.  Scalp soft tissues are unremarkable.  IMPRESSION: 1. No acute intracranial process. 2. Mild atrophy with moderate chronic microvascular ischemic changes. 3. Less sphenoid sinus disease.   Electronically Signed   By: Jeannine Boga M.D.   On: 10/23/2013 00:20    Chart has been reviewed  Assessment/Plan  80 year old female with history of hypertension here with episode of confusion aphagia and potentially right hand weakness which currently resolved worrisome for TIA  Present on Admission:  . TIA (transient ischemic attack) -  - will admit based on TIA/CVA protocol, await results of MRA/MRI, Carotid Doppler and Echo, obtain cardiac enzymes,  ECG,  Lipid panel, TSH. Order PT/OT evaluation. Will make sure patient is on antiplatelet agent. Neurology consult.     . Hypertension, malignant - continue atenolol, she initially presented with high elevated blood pressure up to a high of 204/67 currently down to 169/65   Prophylaxis: SCD   CODE STATUS: FULL CODE  Other plan as per orders.  I have spent a total of 55 min on  this admission  Myrikal Messmer 10/24/2013, 3:48 AM

## 2013-10-24 NOTE — Discharge Instructions (Signed)
STROKE/TIA DISCHARGE INSTRUCTIONS SMOKING Cigarette smoking nearly doubles your risk of having a stroke & is the single most alterable risk factor  If you smoke or have smoked in the last 12 months, you are advised to quit smoking for your health.  Most of the excess cardiovascular risk related to smoking disappears within a year of stopping.  Ask you doctor about anti-smoking medications  [place] Quit Line: 1-800-QUIT NOW  Free Smoking Cessation Classes [phone]  CHOLESTEROL Know your levels; limit fat & cholesterol in your diet  Lipid Panel     Component Value Date/Time   CHOL 176 10/24/2013 0555   TRIG 105 10/24/2013 0555   HDL 44 10/24/2013 0555   CHOLHDL 4.0 10/24/2013 0555   VLDL 21 10/24/2013 0555   LDLCALC 111* 10/24/2013 0555      Many patients benefit from treatment even if their cholesterol is at goal.  Goal: Total Cholesterol (CHOL) less than 160  Goal:  Triglycerides (TRIG) less than 150  Goal:  HDL greater than 40  Goal:  LDL (LDLCALC) less than 100   BLOOD PRESSURE American Stroke Association blood pressure target is less that 120/80 mm/Hg  Your discharge blood pressure is:  BP: 139/81 mmHg  Monitor your blood pressure  Limit your salt and alcohol intake  Many individuals will require more than one medication for high blood pressure  DIABETES (A1c is a blood sugar average for last 3 months) Goal HGBA1c is under 7% (HBGA1c is blood sugar average for last 3 months)  Diabetes: No known diagnosis of diabetes    Lab Results  Component Value Date   HGBA1C 5.7* 10/24/2013     Your HGBA1c can be lowered with medications, healthy diet, and exercise.  Check your blood sugar as directed by your physician  Call your physician if you experience unexplained or low blood sugars.  PHYSICAL ACTIVITY/REHABILITATION Goal is 30 minutes at least 4 days per week  Activity: Increase activity slowly, Therapies:  Return to work:  Activity decreases your risk of heart attack  and stroke and makes your heart stronger.  It helps control your weight and blood pressure; helps you relax and can improve your mood.  Participate in a regular exercise program.  Talk with your doctor about the best form of exercise for you (dancing, walking, swimming, cycling).  DIET/WEIGHT Goal is to maintain a healthy weight  Your discharge diet is: Cardiac thin liquids Your height is:  Height: 5\' 6"  (167.6 cm) Your current weight is: Weight: 113.399 kg (250 lb) Your Body Mass Index (BMI) is:  BMI (Calculated): 40.4  Following the type of diet specifically designed for you will help prevent another stroke.  Your goal weight range is:    Your goal Body Mass Index (BMI) is 19-24.  Healthy food habits can help reduce 3 risk factors for stroke:  High cholesterol, hypertension, and excess weight.  RESOURCES Stroke/Support Group:  Call [phone]   STROKE EDUCATION PROVIDED/REVIEWED AND GIVEN TO PATIENT Stroke warning signs and symptoms How to activate emergency medical system (call 911). Medications prescribed at discharge. Need for follow-up after discharge. Personal risk factors for stroke. Pneumonia vaccine given: No Flu vaccine given: No My questions have been answered, the writing is legible, and I understand these instructions.  I will adhere to these goals & educational materials that have been provided to me after my discharge from the hospital.

## 2013-10-24 NOTE — Progress Notes (Signed)
  Echocardiogram 2D Echocardiogram has been performed.  Samantha Stone 10/24/2013, 2:11 PM

## 2013-10-24 NOTE — Progress Notes (Addendum)
[facility] PROGRESS NOTE  Samantha Stone MWN:[id number] DOB: 23-Apr-1931 DOA: 10/23/2013 PCP: Abigail Miyamoto, MD  Brief narrative: Addendum to admission note done today 3/28 Pt admitted for difficulty speaking concerning for TIA and/or CVA. Her symptoms are all resolved at this time.   Assessment/Plan:  Active Problems:   TIA (transient ischemic attack) - all symptoms resolved this am - CT head and MRI show no acute intracranial findings - 2 D ECHO normal EF - follow up carotid doppler   Hypertension - normalized but then up again to SBP 170's - start low dose norvasc   Code Status: full code Family Communication: no family at the bedside  Disposition Plan: home when stable  Leisa Lenz, MD  [facility] Pager [phone]  If 7PM-7AM, please contact night-coverage [email or web address] Password [facility] 10/24/2013, 7:07 PM   LOS: 1 day    HPI/Subjective: No overnight events.   Objective: Filed Vitals:   10/24/13 1218 10/24/13 1551 10/24/13 1748 10/24/13 1751  BP: 148/61 139/81 176/71 173/69  Pulse: 53 47 61 57  Temp: 97.5 F (36.4 C) 98.8 F (37.1 C) 97.5 F (36.4 C)   TempSrc: Oral Oral Oral   Resp: 18 18 18    Height:      Weight:      SpO2: 95% 98% 97%     Intake/Output Summary (Last 24 hours) at 10/24/13 1907 Last data filed at 10/24/13 1300  Gross per 24 hour  Intake    480 ml  Output      1 ml  Net    479 ml    Exam:   General:  Pt is alert, follows commands appropriately, not in acute distress  Cardiovascular: Regular rate and rhythm, S1/S2, no murmurs, no rubs, no gallops  Respiratory: Clear to auscultation bilaterally, no wheezing, no crackles, no rhonchi  Abdomen: Soft, non tender, non distended, bowel sounds present, no guarding  Extremities: No edema, pulses DP and PT palpable bilaterally  Neuro: Grossly nonfocal  Data Reviewed: Basic Metabolic Panel:  Recent Labs Lab 10/23/13 2159  NA 137  K 4.5  CL 98  CO2 29   GLUCOSE 115*  BUN 16  CREATININE 0.90  CALCIUM 9.8   Liver Function Tests:  Recent Labs Lab 10/23/13 2159  AST 23  ALT 20  ALKPHOS 66  BILITOT 0.5  PROT 7.3  ALBUMIN 4.1   No results found for this basename: LIPASE, AMYLASE,  in the last 168 hours No results found for this basename: AMMONIA,  in the last 168 hours CBC:  Recent Labs Lab 10/23/13 2159  WBC 7.9  NEUTROABS 5.0  HGB 14.3  HCT 41.9  MCV 86.2  PLT 274   Cardiac Enzymes:  Recent Labs Lab 10/23/13 2159  TROPONINI <0.30   BNP: No components found with this basename: POCBNP,  CBG:  Recent Labs Lab 10/24/13 0746 10/24/13 1221  GLUCAP 99 88    No results found for this or any previous visit (from the past 240 hour(s)).   Studies: Dg Chest 2 View  10/23/2013   CLINICAL DATA:  Headache and hypertension.  EXAM: CHEST  2 VIEW  COMPARISON:  08/31/2003  FINDINGS: Mild cardiomegaly which is chronic. Unchanged mild tortuosity of the aorta. No infiltrate or edema. No effusion or pneumothorax. Peripherally calcified structure in the left upper quadrant measuring 13 mm. 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 Size is unchanged from 2005.  IMPRESSION: No active cardiopulmonary disease.   Electronically Signed  By: Jorje Guild M.D.   On: 10/23/2013 22:46   Ct Head Wo Contrast  10/23/2013   CLINICAL DATA:  Fall, hit right side of head  EXAM: CT HEAD WITHOUT CONTRAST  TECHNIQUE: Contiguous axial images were obtained from the base of the skull through the vertex without intravenous contrast.  COMPARISON:  None.  FINDINGS: Scattered and confluent hypodensity within the periventricular and deep white matter both cerebral hemispheres is compatible with moderate chronic microvascular ischemic disease. Mild age-related atrophy is present.  There is no acute intracranial hemorrhage or infarct. No mass lesion or midline shift. Gray-white matter differentiation is well  maintained. Ventricles are normal in size without evidence of hydrocephalus.  The calvarium is intact.  Orbital soft tissues are within normal limits.  Circumferential opacity present within the left sphenoid sinus. Paranasal sinuses are otherwise largely clear. No mastoid effusion.  Scalp soft tissues are unremarkable.  IMPRESSION: 1. No acute intracranial process. 2. Mild atrophy with moderate chronic microvascular ischemic changes. 3. Less sphenoid sinus disease.   Electronically Signed   By: Jeannine Boga M.D.   On: 10/23/2013 00:20   Mri Brain Without Contrast  10/24/2013   CLINICAL DATA:  80 year old female with acute onset confusion, aphasia. Initial encounter.  EXAM: MRI HEAD WITHOUT CONTRAST  MRA HEAD WITHOUT CONTRAST  TECHNIQUE: Multiplanar, multiecho pulse sequences of the brain and surrounding structures were obtained without intravenous contrast. Angiographic images of the head were obtained using MRA technique without contrast.  COMPARISON:  Head CT without contrast 10/22/2013.  FINDINGS: MRI HEAD FINDINGS  Cerebral volume is within normal limits for age. No restricted diffusion to suggest acute infarction. No midline shift, mass effect, evidence of mass lesion, ventriculomegaly, extra-axial collection or acute intracranial hemorrhage. Cervicomedullary junction and pituitary are within normal limits. Major intracranial vascular flow voids are preserved.  Mild to moderate for age scattered cerebral white matter T2 and FLAIR hyperintensity, confluent in the periventricular white matter. No cortical encephalomalacia identified. Deep gray matter nuclei, brainstem and cerebellum are within normal limits for age. Visible internal auditory structures appear normal.  Degenerative spondylolisthesis and disc space loss at C3-C4. Visualized bone marrow signal is within normal limits. Visualized orbit soft tissues are within normal limits. Mild paranasal sinus mucosal thickening. Mastoids are clear.  Visualized scalp soft tissues are within normal limits.  MRA HEAD FINDINGS  Antegrade flow in the posterior circulation with codominant distal vertebral arteries. Normal vertebrobasilar junction and AICA origins. Normal basilar artery. Normal SCA and PCA origins (shared origin on the left). Posterior communicating arteries are diminutive or absent. Bilateral PCA branches are normal.  Antegrade flow in both ICA siphons. Tortuous cavernous segments. Normal ophthalmic artery origins. No ICA stenosis. Normal carotid termini, MCA and ACA origins.  Diminutive anterior communicating artery. Visualized bilateral ACA branches are normal. There is mild irregularity of the bilateral MCA M1 segments. There is Mild to moderate stenosis of a left MCA posterior M2 branch at the left MCA bifurcation (series 504, image 5). Otherwise negative visualized bilateral MCA branches.  IMPRESSION: 1.  No acute intracranial abnormality. 2. Mild to moderate for age nonspecific signal changes in the white matter, most commonly due to chronic small vessel disease. 3. Left greater than right MCA atherosclerosis. Otherwise normal for age intracranial MRA. 4. Degenerative disc space loss and spondylolisthesis at C3-C4.   Electronically Signed   By: Lars Pinks M.D.   On: 10/24/2013 17:57   Mr Jodene Nam Head/brain Wo Cm  10/24/2013   CLINICAL DATA:  80 year old female with acute onset confusion, aphasia. Initial encounter.  EXAM: MRI HEAD WITHOUT CONTRAST  MRA HEAD WITHOUT CONTRAST  TECHNIQUE: Multiplanar, multiecho pulse sequences of the brain and surrounding structures were obtained without intravenous contrast. Angiographic images of the head were obtained using MRA technique without contrast.  COMPARISON:  Head CT without contrast 10/22/2013.  FINDINGS: MRI HEAD FINDINGS  Cerebral volume is within normal limits for age. No restricted diffusion to suggest acute infarction. No midline shift, mass effect, evidence of mass lesion, ventriculomegaly,  extra-axial collection or acute intracranial hemorrhage. Cervicomedullary junction and pituitary are within normal limits. Major intracranial vascular flow voids are preserved.  Mild to moderate for age scattered cerebral white matter T2 and FLAIR hyperintensity, confluent in the periventricular white matter. No cortical encephalomalacia identified. Deep gray matter nuclei, brainstem and cerebellum are within normal limits for age. Visible internal auditory structures appear normal.  Degenerative spondylolisthesis and disc space loss at C3-C4. Visualized bone marrow signal is within normal limits. Visualized orbit soft tissues are within normal limits. Mild paranasal sinus mucosal thickening. Mastoids are clear. Visualized scalp soft tissues are within normal limits.  MRA HEAD FINDINGS  Antegrade flow in the posterior circulation with codominant distal vertebral arteries. Normal vertebrobasilar junction and AICA origins. Normal basilar artery. Normal SCA and PCA origins (shared origin on the left). Posterior communicating arteries are diminutive or absent. Bilateral PCA branches are normal.  Antegrade flow in both ICA siphons. Tortuous cavernous segments. Normal ophthalmic artery origins. No ICA stenosis. Normal carotid termini, MCA and ACA origins.  Diminutive anterior communicating artery. Visualized bilateral ACA branches are normal. There is mild irregularity of the bilateral MCA M1 segments. There is Mild to moderate stenosis of a left MCA posterior M2 branch at the left MCA bifurcation (series 504, image 5). Otherwise negative visualized bilateral MCA branches.  IMPRESSION: 1.  No acute intracranial abnormality. 2. Mild to moderate for age nonspecific signal changes in the white matter, most commonly due to chronic small vessel disease. 3. Left greater than right MCA atherosclerosis. Otherwise normal for age intracranial MRA. 4. Degenerative disc space loss and spondylolisthesis at C3-C4.   Electronically  Signed   By: Lars Pinks M.D.   On: 10/24/2013 17:57    Scheduled Meds: . aspirin  325 mg Oral Daily  . atenolol  100 mg Oral Daily  . levothyroxine  137 mcg Oral QAC breakfast  . simvastatin  5 mg Oral q1800   Continuous Infusions:

## 2013-10-25 DIAGNOSIS — G459 Transient cerebral ischemic attack, unspecified: Secondary | ICD-10-CM

## 2013-10-25 LAB — GLUCOSE, CAPILLARY
Glucose-Capillary: 94 mg/dL (ref 70–99)
Glucose-Capillary: 97 mg/dL (ref 70–99)

## 2013-10-25 MED ORDER — AMLODIPINE BESYLATE 2.5 MG PO TABS: 2.5000 mg | ORAL_TABLET | Freq: Every day | ORAL | Status: DC

## 2013-10-25 NOTE — Progress Notes (Signed)
Utilization review completed.  P.J. Zain Bingman,RN,BSN Case Manager 

## 2013-10-25 NOTE — Progress Notes (Signed)
Pt requested Lunesta last night to help sleep. She reports taking it at home with no ill effects but she does not do well with Ambien.  MD notified, Johnnye Sima ordered and given.  Around 2300 pt woke and began walking the halls.  She attempted to go out the fire escape and was disoriented x4.  The rest of her neuro check was normal.  MD notified. Instructed RN to continue to monitor.  Throughout the rest of the night she was back and forth on what she was oriented to but the rest of her neuro remained the same.  This morning the pt is alert and oriented x4 and reports feeling very rested.

## 2013-10-25 NOTE — Progress Notes (Signed)
D/c orders received;IV removed with gauze on, pt remains in stable condition, pt meds and instructions reviewed and given to pt; reviewed s/s of stroke; pt d/c to home

## 2013-10-25 NOTE — Discharge Summary (Signed)
Physician Discharge Summary  Samantha Stone ACZ:[id number] DOB: 1931/07/30 DOA: 10/23/2013  PCP: Abigail Miyamoto, MD  Admit date: 10/23/2013 Discharge date: 10/25/2013  Recommendations for Outpatient Follow-up:  MRI brain did not show stroke. Please follow up with your PCP in 1 week to make sure your symptoms are controlled. Please note we started low dose Norvasc to better control your blood pressure. If you blood pressure is higher than 13/85 please take norvasc 2.5 mg daily  Discharge Diagnoses:  Active Problems:   Aphasia   TIA (transient ischemic attack)   Hypertension, malignant    Discharge Condition: medically stable for discharge home today   Diet recommendation: as tolerated  History of present illness:  Pt admitted for difficulty speaking concerning for TIA and/or CVA. Her symptoms are all resolved at this time.   Assessment/Plan:   Active Problems:  TIA (transient ischemic attack)  - all symptoms resolved this am  - CT head and MRI show no acute intracranial findings  - 2 D ECHO normal EF - carotid doppler unremarkable  Hypertension  - normalized but then up again to SBP 170's  - started low dose norvasc  - continue atenolol  Code Status: full code  Family Communication: no family at the bedside    Signed:  Leisa Lenz, MD  [facility] 10/25/2013, 12:34 PM  Pager #: [phone]    Discharge Exam: Filed Vitals:   10/25/13 0936  BP: 115/99  Pulse: 64  Temp:   Resp:    Filed Vitals:   10/25/13 0000 10/25/13 0400 10/25/13 0831 10/25/13 0936  BP: 129/40 120/41 138/71 115/99  Pulse: 53 52 66 64  Temp:  98 F (36.7 C) 97.7 F (36.5 C)   TempSrc:  Oral Oral   Resp: 16 16 16    Height:      Weight:      SpO2: 98% 98% 98%     General: Pt is alert, follows commands appropriately, not in acute distress Cardiovascular: Regular rate and rhythm, S1/S2 +, no murmurs, no rubs, no gallops Respiratory: Clear to auscultation bilaterally, no  wheezing, no crackles, no rhonchi Abdominal: Soft, non tender, non distended, bowel sounds +, no guarding Extremities: no edema, no cyanosis, pulses palpable bilaterally DP and PT Neuro: Grossly nonfocal  Discharge Instructions  Discharge Orders   Future Orders Complete By Expires   Call MD for:  difficulty breathing, headache or visual disturbances  As directed    Call MD for:  difficulty breathing, headache or visual disturbances  As directed    Call MD for:  persistant dizziness or light-headedness  As directed    Call MD for:  persistant dizziness or light-headedness  As directed    Call MD for:  persistant nausea and vomiting  As directed    Call MD for:  persistant nausea and vomiting  As directed    Call MD for:  severe uncontrolled pain  As directed    Call MD for:  severe uncontrolled pain  As directed    Diet - low sodium heart healthy  As directed    Diet - low sodium heart healthy  As directed    Discharge instructions  As directed    Comments:     MRI brain did not show stroke. Please follow up with your PCP in 1 week to make sure your symptoms are controlled. Please note we started low dose Norvasc to better control your blood pressure. If you blood pressure is higher than 13/85 please take  norvasc 2.5 mg daily   Increase activity slowly  As directed    Increase activity slowly  As directed        Medication List    STOP taking these medications       eszopiclone 2 MG Tabs tablet  Commonly known as:  LUNESTA      TAKE these medications       amLODipine 2.5 MG tablet  Commonly known as:  NORVASC  Take 1 tablet (2.5 mg total) by mouth daily.     aspirin 81 MG tablet  Take 81 mg by mouth daily.     atenolol 100 MG tablet  Commonly known as:  TENORMIN  Take 100 mg by mouth daily.     folic acid 1 MG tablet  Commonly known as:  FOLVITE  Take 1 mg by mouth daily.     levothyroxine 137 MCG tablet  Commonly known as:  SYNTHROID, LEVOTHROID  Take 137 mcg by  mouth daily.     naproxen sodium 220 MG tablet  Commonly known as:  ANAPROX  Take 220-660 mg by mouth daily as needed (pain).     pravastatin 10 MG tablet  Commonly known as:  PRAVACHOL  Take 10 mg by mouth daily.     vitamin C 500 MG tablet  Commonly known as:  ASCORBIC ACID  Take 500 mg by mouth daily.           Follow-up Information   Follow up with FRIED, ROBERT L, MD. Schedule an appointment as soon as possible for a visit in 1 week.   Specialty:  Family Medicine   Contact information:   [place] [address] [phone]        The results of significant diagnostics from this hospitalization (including imaging, microbiology, ancillary and laboratory) are listed below for reference.    Significant Diagnostic Studies: Dg Chest 2 View  10/23/2013   CLINICAL DATA:  Headache and hypertension.  EXAM: CHEST  2 VIEW  COMPARISON:  08/31/2003  FINDINGS: Mild cardiomegaly which is chronic. Unchanged mild tortuosity of the aorta. No infiltrate or edema. No effusion or pneumothorax. Peripherally calcified structure in the left upper quadrant measuring 13 mm. 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 Size is unchanged from 2005.  IMPRESSION: No active cardiopulmonary disease.   Electronically Signed   By: Jorje Guild M.D.   On: 10/23/2013 22:46   Ct Head Wo Contrast  10/23/2013   CLINICAL DATA:  Fall, hit right side of head  EXAM: CT HEAD WITHOUT CONTRAST  TECHNIQUE: Contiguous axial images were obtained from the base of the skull through the vertex without intravenous contrast.  COMPARISON:  None.  FINDINGS: Scattered and confluent hypodensity within the periventricular and deep white matter both cerebral hemispheres is compatible with moderate chronic microvascular ischemic disease. Mild age-related atrophy is present.  There is no acute intracranial hemorrhage or infarct. No mass lesion or midline shift. Gray-white  matter differentiation is well maintained. Ventricles are normal in size without evidence of hydrocephalus.  The calvarium is intact.  Orbital soft tissues are within normal limits.  Circumferential opacity present within the left sphenoid sinus. Paranasal sinuses are otherwise largely clear. No mastoid effusion.  Scalp soft tissues are unremarkable.  IMPRESSION: 1. No acute intracranial process. 2. Mild atrophy with moderate chronic microvascular ischemic changes. 3. Less sphenoid sinus disease.   Electronically Signed   By: Jeannine Boga M.D.   On:  10/23/2013 00:20   Mri Brain Without Contrast  10/24/2013   CLINICAL DATA:  80 year old female with acute onset confusion, aphasia. Initial encounter.  EXAM: MRI HEAD WITHOUT CONTRAST  MRA HEAD WITHOUT CONTRAST  TECHNIQUE: Multiplanar, multiecho pulse sequences of the brain and surrounding structures were obtained without intravenous contrast. Angiographic images of the head were obtained using MRA technique without contrast.  COMPARISON:  Head CT without contrast 10/22/2013.  FINDINGS: MRI HEAD FINDINGS  Cerebral volume is within normal limits for age. No restricted diffusion to suggest acute infarction. No midline shift, mass effect, evidence of mass lesion, ventriculomegaly, extra-axial collection or acute intracranial hemorrhage. Cervicomedullary junction and pituitary are within normal limits. Major intracranial vascular flow voids are preserved.  Mild to moderate for age scattered cerebral white matter T2 and FLAIR hyperintensity, confluent in the periventricular white matter. No cortical encephalomalacia identified. Deep gray matter nuclei, brainstem and cerebellum are within normal limits for age. Visible internal auditory structures appear normal.  Degenerative spondylolisthesis and disc space loss at C3-C4. Visualized bone marrow signal is within normal limits. Visualized orbit soft tissues are within normal limits. Mild paranasal sinus mucosal  thickening. Mastoids are clear. Visualized scalp soft tissues are within normal limits.  MRA HEAD FINDINGS  Antegrade flow in the posterior circulation with codominant distal vertebral arteries. Normal vertebrobasilar junction and AICA origins. Normal basilar artery. Normal SCA and PCA origins (shared origin on the left). Posterior communicating arteries are diminutive or absent. Bilateral PCA branches are normal.  Antegrade flow in both ICA siphons. Tortuous cavernous segments. Normal ophthalmic artery origins. No ICA stenosis. Normal carotid termini, MCA and ACA origins.  Diminutive anterior communicating artery. Visualized bilateral ACA branches are normal. There is mild irregularity of the bilateral MCA M1 segments. There is Mild to moderate stenosis of a left MCA posterior M2 branch at the left MCA bifurcation (series 504, image 5). Otherwise negative visualized bilateral MCA branches.  IMPRESSION: 1.  No acute intracranial abnormality. 2. Mild to moderate for age nonspecific signal changes in the white matter, most commonly due to chronic small vessel disease. 3. Left greater than right MCA atherosclerosis. Otherwise normal for age intracranial MRA. 4. Degenerative disc space loss and spondylolisthesis at C3-C4.   Electronically Signed   By: Lars Pinks M.D.   On: 10/24/2013 17:57   Mr Jodene Nam Head/brain Wo Cm  10/24/2013   CLINICAL DATA:  80 year old female with acute onset confusion, aphasia. Initial encounter.  EXAM: MRI HEAD WITHOUT CONTRAST  MRA HEAD WITHOUT CONTRAST  TECHNIQUE: Multiplanar, multiecho pulse sequences of the brain and surrounding structures were obtained without intravenous contrast. Angiographic images of the head were obtained using MRA technique without contrast.  COMPARISON:  Head CT without contrast 10/22/2013.  FINDINGS: MRI HEAD FINDINGS  Cerebral volume is within normal limits for age. No restricted diffusion to suggest acute infarction. No midline shift, mass effect, evidence of mass  lesion, ventriculomegaly, extra-axial collection or acute intracranial hemorrhage. Cervicomedullary junction and pituitary are within normal limits. Major intracranial vascular flow voids are preserved.  Mild to moderate for age scattered cerebral white matter T2 and FLAIR hyperintensity, confluent in the periventricular white matter. No cortical encephalomalacia identified. Deep gray matter nuclei, brainstem and cerebellum are within normal limits for age. Visible internal auditory structures appear normal.  Degenerative spondylolisthesis and disc space loss at C3-C4. Visualized bone marrow signal is within normal limits. Visualized orbit soft tissues are within normal limits. Mild paranasal sinus mucosal thickening. Mastoids are clear. Visualized scalp soft tissues  are within normal limits.  MRA HEAD FINDINGS  Antegrade flow in the posterior circulation with codominant distal vertebral arteries. Normal vertebrobasilar junction and AICA origins. Normal basilar artery. Normal SCA and PCA origins (shared origin on the left). Posterior communicating arteries are diminutive or absent. Bilateral PCA branches are normal.  Antegrade flow in both ICA siphons. Tortuous cavernous segments. Normal ophthalmic artery origins. No ICA stenosis. Normal carotid termini, MCA and ACA origins.  Diminutive anterior communicating artery. Visualized bilateral ACA branches are normal. There is mild irregularity of the bilateral MCA M1 segments. There is Mild to moderate stenosis of a left MCA posterior M2 branch at the left MCA bifurcation (series 504, image 5). Otherwise negative visualized bilateral MCA branches.  IMPRESSION: 1.  No acute intracranial abnormality. 2. Mild to moderate for age nonspecific signal changes in the white matter, most commonly due to chronic small vessel disease. 3. Left greater than right MCA atherosclerosis. Otherwise normal for age intracranial MRA. 4. Degenerative disc space loss and spondylolisthesis at  C3-C4.   Electronically Signed   By: Lars Pinks M.D.   On: 10/24/2013 17:57    Microbiology: No results found for this or any previous visit (from the past 240 hour(s)).   Labs: Basic Metabolic Panel:  Recent Labs Lab 10/23/13 2159  NA 137  K 4.5  CL 98  CO2 29  GLUCOSE 115*  BUN 16  CREATININE 0.90  CALCIUM 9.8   Liver Function Tests:  Recent Labs Lab 10/23/13 2159  AST 23  ALT 20  ALKPHOS 66  BILITOT 0.5  PROT 7.3  ALBUMIN 4.1   No results found for this basename: LIPASE, AMYLASE,  in the last 168 hours No results found for this basename: AMMONIA,  in the last 168 hours CBC:  Recent Labs Lab 10/23/13 2159  WBC 7.9  NEUTROABS 5.0  HGB 14.3  HCT 41.9  MCV 86.2  PLT 274   Cardiac Enzymes:  Recent Labs Lab 10/23/13 2159  TROPONINI <0.30   BNP: BNP (last 3 results) No results found for this basename: PROBNP,  in the last 8760 hours CBG:  Recent Labs Lab 10/24/13 0746 10/24/13 1221 10/24/13 1747 10/24/13 2122 10/25/13 0753  GLUCAP 99 88 90 121* 94    Time coordinating discharge: Over 30 minutes

## 2013-10-25 NOTE — Progress Notes (Signed)
VASCULAR LAB PRELIMINARY  PRELIMINARY  PRELIMINARY  PRELIMINARY  Carotid duplex completed.    Preliminary report:  Bilateral:  1-39% ICA stenosis lower end of range  Vertebral artery flow is antegrade.     Ereka Brau, RVS 10/25/2013, 11:34 AM

## 2013-10-30 ENCOUNTER — Emergency Department (HOSPITAL_BASED_OUTPATIENT_CLINIC_OR_DEPARTMENT_OTHER)
Admission: EM | Admit: 2013-10-30 | Discharge: 2013-10-30 | Disposition: A | Discharge status: Home / Self Care | Payer: Medicare Other | Attending: Emergency Medicine | Admitting: Emergency Medicine

## 2013-10-30 ENCOUNTER — Encounter (HOSPITAL_BASED_OUTPATIENT_CLINIC_OR_DEPARTMENT_OTHER): Payer: Self-pay | Admitting: Emergency Medicine

## 2013-10-30 DIAGNOSIS — Z87828 Personal history of other (healed) physical injury and trauma: Secondary | ICD-10-CM | POA: Insufficient documentation

## 2013-10-30 DIAGNOSIS — Z7982 Long term (current) use of aspirin: Secondary | ICD-10-CM | POA: Insufficient documentation

## 2013-10-30 DIAGNOSIS — Z79899 Other long term (current) drug therapy: Secondary | ICD-10-CM | POA: Insufficient documentation

## 2013-10-30 DIAGNOSIS — M129 Arthropathy, unspecified: Secondary | ICD-10-CM | POA: Insufficient documentation

## 2013-10-30 DIAGNOSIS — E039 Hypothyroidism, unspecified: Secondary | ICD-10-CM | POA: Insufficient documentation

## 2013-10-30 DIAGNOSIS — I10 Essential (primary) hypertension: Secondary | ICD-10-CM | POA: Insufficient documentation

## 2013-10-30 DIAGNOSIS — R519 Headache, unspecified: Secondary | ICD-10-CM

## 2013-10-30 DIAGNOSIS — R51 Headache: Secondary | ICD-10-CM | POA: Insufficient documentation

## 2013-10-30 HISTORY — DX: Concussion with loss of consciousness status unknown, initial encounter: S06.0XAA

## 2013-10-30 HISTORY — DX: Concussion with loss of consciousness of unspecified duration, initial encounter: S06.0X9A

## 2013-10-30 MED ORDER — HYDROCODONE-ACETAMINOPHEN 5-325 MG PO TABS: 1.0000 | ORAL_TABLET | Freq: Four times a day (QID) | ORAL | Status: DC | PRN

## 2013-10-30 MED ORDER — HYDROCODONE-ACETAMINOPHEN 5-325 MG PO TABS
1.0000 | ORAL_TABLET | Freq: Once | ORAL | Status: AC
  Administered 2013-10-30: 1 via ORAL
  Filled 2013-10-30: qty 1

## 2013-10-30 NOTE — ED Notes (Signed)
Pt c/o headache since 3/5 when she got a concussion; states was admitted at week at [facility] and had all neuro MRI, CT with neg results; pt c/o same headache, took advil with no relief; pt denies nausea states a little dizziness
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
## 2013-10-30 NOTE — Discharge Instructions (Signed)
As discussed, your evaluation today has been largely reassuring.  But, it is important that you monitor your condition carefully, and do not hesitate to return to the ED if you develop new, or concerning changes in your condition. ? ?Otherwise, please follow-up with your physician for appropriate ongoing care. ? ?

## 2013-11-22 ENCOUNTER — Other Ambulatory Visit: Payer: Self-pay | Admitting: Internal Medicine

## 2014-02-04 ENCOUNTER — Encounter (HOSPITAL_COMMUNITY): Payer: Self-pay | Admitting: Emergency Medicine

## 2014-02-04 ENCOUNTER — Emergency Department (HOSPITAL_COMMUNITY): Payer: Medicare Other

## 2014-02-04 ENCOUNTER — Emergency Department (HOSPITAL_COMMUNITY)
Admission: EM | Admit: 2014-02-04 | Discharge: 2014-02-04 | Disposition: A | Discharge status: Home / Self Care | Payer: Medicare Other | Attending: Emergency Medicine | Admitting: Emergency Medicine

## 2014-02-04 DIAGNOSIS — IMO0002 Reserved for concepts with insufficient information to code with codable children: Secondary | ICD-10-CM | POA: Insufficient documentation

## 2014-02-04 DIAGNOSIS — W010XXA Fall on same level from slipping, tripping and stumbling without subsequent striking against object, initial encounter: Secondary | ICD-10-CM | POA: Insufficient documentation

## 2014-02-04 DIAGNOSIS — I10 Essential (primary) hypertension: Secondary | ICD-10-CM | POA: Insufficient documentation

## 2014-02-04 DIAGNOSIS — M129 Arthropathy, unspecified: Secondary | ICD-10-CM | POA: Insufficient documentation

## 2014-02-04 DIAGNOSIS — Z7982 Long term (current) use of aspirin: Secondary | ICD-10-CM | POA: Insufficient documentation

## 2014-02-04 DIAGNOSIS — S8991XA Unspecified injury of right lower leg, initial encounter: Secondary | ICD-10-CM

## 2014-02-04 DIAGNOSIS — S99929A Unspecified injury of unspecified foot, initial encounter: Principal | ICD-10-CM

## 2014-02-04 DIAGNOSIS — Y9389 Activity, other specified: Secondary | ICD-10-CM | POA: Insufficient documentation

## 2014-02-04 DIAGNOSIS — S8990XA Unspecified injury of unspecified lower leg, initial encounter: Secondary | ICD-10-CM | POA: Insufficient documentation

## 2014-02-04 DIAGNOSIS — E039 Hypothyroidism, unspecified: Secondary | ICD-10-CM | POA: Insufficient documentation

## 2014-02-04 DIAGNOSIS — S99919A Unspecified injury of unspecified ankle, initial encounter: Principal | ICD-10-CM

## 2014-02-04 DIAGNOSIS — Z79899 Other long term (current) drug therapy: Secondary | ICD-10-CM | POA: Insufficient documentation

## 2014-02-04 DIAGNOSIS — Y92009 Unspecified place in unspecified non-institutional (private) residence as the place of occurrence of the external cause: Secondary | ICD-10-CM | POA: Insufficient documentation

## 2014-02-04 DIAGNOSIS — S86911A Strain of unspecified muscle(s) and tendon(s) at lower leg level, right leg, initial encounter: Secondary | ICD-10-CM

## 2014-02-04 NOTE — ED Provider Notes (Signed)
CSN: [id number]     Arrival date & time 02/04/14  1311 History   First MD Initiated Contact with Patient 02/04/14 1326     Chief Complaint  Patient presents with  . Knee Pain     (Consider location/radiation/quality/duration/timing/severity/associated sxs/prior Treatment) HPI  80 year old female who was brought in via EMS from home for evaluation of right knee injury. Patient reports she was carrying a bucket of water, walking across her kitchen when she slipped on tile, "did a split" and injured her right knee in the process. She denies hitting her head or any loss of consciousness. She was unable to get up from the fall. Her husband was able to call EMS who arrived and able to assist her up. Patient's report worsening right knee pain with weightbearing. Complaining of a sharp pain to right knee radiates up to her thigh when standing. Pain is primarily to the back of her knee. No complaints of any hip or ankle pain. Patient is not on any blood thinning medication. Patient did complain of pain to her low back and buttocks region. No complaint of headache, chest pain, abdominal pain, new numbness or weakness. No specific treatment tried. Patient has bilateral knee replacement surgery in the past. She denies any precipitating symptoms prior to fall  Past Medical History  Diagnosis Date  . Hypertension   . Hypothyroidism   . Arthritis   . Concussion    Past Surgical History  Procedure Laterality Date  . I&d extremity  03/11/2012    Procedure: MINOR IRRIGATION AND DEBRIDEMENT EXTREMITY;  Surgeon: Cammie Sickle., MD;  Location: [facility];  Service: Orthopedics;  Laterality: Right;  Right little finger  . Tonsillectomy    . Appendectomy    . Abdominal hysterectomy    . Trigger finger release    . Knee arthroscopy      bilat   . Replacement total knee bilateral    . Carpal tunnel release      rt x2  . Tubal ligation    . Colonoscopy     Family History  Problem  Relation Age of Onset  . Bladder Cancer Father    History  Substance Use Topics  . Smoking status: Never Smoker   . Smokeless tobacco: Not on file  . Alcohol Use: Yes     Comment: occ   OB History   Grav Para Term Preterm Abortions TAB SAB Ect Mult Living                 Review of Systems  All other systems reviewed and are negative.     Allergies  Phenergan  Home Medications   Prior to Admission medications   Medication Sig Start Date End Date Taking? Authorizing Provider  aspirin 81 MG tablet Take 81 mg by mouth daily.    Historical Provider, MD  atenolol (TENORMIN) 100 MG tablet Take 100 mg by mouth daily.    Historical Provider, MD  Eszopiclone 3 MG TABS Take 3 mg by mouth at bedtime as needed (sleep).  01/29/14   Historical Provider, MD  folic acid (FOLVITE) 1 MG tablet Take 1 mg by mouth daily.    Historical Provider, MD  HYDROcodone-acetaminophen (NORCO/VICODIN) 5-325 MG per tablet Take 1 tablet by mouth every 6 (six) hours as needed for moderate pain. 10/30/13   Carmin Muskrat, MD  levothyroxine (SYNTHROID, LEVOTHROID) 137 MCG tablet Take 137 mcg by mouth daily.    Historical Provider, MD  naproxen sodium (ANAPROX)  220 MG tablet Take 220-660 mg by mouth daily as needed (pain).     Historical Provider, MD  pravastatin (PRAVACHOL) 10 MG tablet Take 10 mg by mouth daily.    Historical Provider, MD  vitamin C (ASCORBIC ACID) 500 MG tablet Take 500 mg by mouth daily.    Historical Provider, MD   BP 169/47  Pulse 58  Temp(Src) 97.6 F (36.4 C) (Oral)  Resp 16  SpO2 96% Physical Exam  Nursing note and vitals reviewed. Constitutional: She appears well-developed and well-nourished. No distress.  HENT:  Head: Atraumatic.  Eyes: Conjunctivae are normal.  Neck: Neck supple.  Cardiovascular: Normal rate and regular rhythm.   Pulmonary/Chest: Effort normal and breath sounds normal.  Abdominal: Soft. There is no tenderness.  Musculoskeletal: She exhibits tenderness (R  knee: tenderness to posterior fossa without deformity.  Increasing pain with knee extension but maintain FROM.  negative anterior/posterior drawer test, negative varus/valgus.  ).  R hip and R ankle with FROM, nontender on exam.  Mild paralumbar tenderness on exam but has large body habitus inhibiting thorough exam.    Neurological: She is alert.  Skin: No rash noted.  Psychiatric: She has a normal mood and affect.    ED Course  Procedures (including critical care time)  3:47 PM Patient had a mechanical fall and hyperextended her right knee. X-ray is unremarkable both right hip and knee. She is able to ambulate without any difficulty at this time. She does have a orthopedic doctor that she can followup closely she has no other complaint. Care discussed with attending.    Labs Reviewed - No data to display  Imaging Review Dg Hip Bilateral W/pelvis  02/04/2014   CLINICAL DATA:  Fall.  EXAM: BILATERAL HIP WITH PELVIS - 4+ VIEW  COMPARISON:  None.  FINDINGS: Degenerative changes lumbar spine and both hips. No acute abnormality identified. Pelvic calcifications consistent with phleboliths.  IMPRESSION: DJD.  No acute abnormality.   Electronically Signed   By: Marcello Moores  Register   On: 02/04/2014 14:37   Dg Knee Complete 4 Views Right  02/04/2014   CLINICAL DATA:  Fall.  EXAM: RIGHT KNEE - COMPLETE 4+ VIEW  COMPARISON:  None.  FINDINGS: Total right knee replacement. Good anatomic alignment. No acute abnormality.  IMPRESSION: Total right knee replacement with good anatomic alignment. No acute abnormality.   Electronically Signed   By: Marcello Moores  Register   On: 02/04/2014 14:36     EKG Interpretation None      MDM   Final diagnoses:  Right knee injury, initial encounter  Muscle strain of knee, right, initial encounter    BP 145/71  Pulse 56  Temp(Src) 97.6 F (36.4 C) (Oral)  Resp 16  SpO2 98%  I have reviewed nursing notes and vital signs. I personally reviewed the imaging tests through  PACS system  I reviewed available ER/hospitalization records thought the EMR     Domenic Moras, [place] 02/04/14 1549

## 2014-02-04 NOTE — ED Notes (Signed)
Pt presents via EMS from home. Pt reports that she slipped on some water in the kitchen and fell on tile. States that she "did the splits" and is having pain to the back of her right knee. No deformity noted. Pulses and sensation intact.

## 2014-02-04 NOTE — ED Notes (Signed)
Pt ambulated in hall with no issue. States that she is ready to go home and will follow up with ortho.

## 2014-02-04 NOTE — Discharge Instructions (Signed)

## 2014-02-06 NOTE — ED Provider Notes (Signed)
Medical screening examination/treatment/procedure(s) were performed by non-physician practitioner and as supervising physician I was immediately available for consultation/collaboration.   EKG Interpretation None        Alfonzo Feller, DO 02/06/14 1358

## 2014-02-12 ENCOUNTER — Encounter: Payer: Self-pay | Admitting: Cardiovascular Disease

## 2014-02-12 ENCOUNTER — Ambulatory Visit (INDEPENDENT_AMBULATORY_CARE_PROVIDER_SITE_OTHER): Payer: Medicare Other | Admitting: Cardiovascular Disease

## 2014-02-12 VITALS — BP 178/78 | HR 62 | Ht 66.0 in | Wt 255.0 lb

## 2014-02-12 DIAGNOSIS — I10 Essential (primary) hypertension: Secondary | ICD-10-CM

## 2014-02-12 MED ORDER — LOSARTAN POTASSIUM 50 MG PO TABS
50.0000 mg | ORAL_TABLET | Freq: Every day | ORAL | Status: DC
Start: 2014-02-12 — End: 2014-05-18

## 2014-02-12 NOTE — Progress Notes (Signed)
Samantha Stone Date of Birth  Dec 29, 1930       [facility]    [employer] [address], [place], [place]  [address]   [place], [place]  [address] [place]   Fax  [phone]     Fax [phone]  Problem List: 1. Hypertension 2. Possible TIAs  - mental status changes , difficulty talking.  3. Hypothyroidism 4. Hyperlipidemia 5.   History of Present Illness:  Samantha Stone is an 80 yo who I have known for many years.  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    Samantha Stone has fallen several times over the past several months.  She slipped on the wet floor on the ceramic tile.    She presents for further management of her HTN.    Her BP has been irregular at home.   Her diet is fairly consistent.  She does not eat much salt.  She has frozen lasagna on occasion.  Hot dog once a month or so.    She was admitted to [facility] in March, 2015 for mental confusion and HTN Echo showed:  Left ventricle: The cavity size was normal. Wall thickness was at the upper limits of normal. Systolic function was normal. The estimated ejection fraction was in the range of 60% to 65%. Wall motion was normal; there were no regional wall motion abnormalities. Doppler parameters are consistent with abnormal left ventricular relaxation (grade 1 diastolic dysfunction). - Mitral valve: Mild regurgitation. - Left atrium: The atrium was moderately to severely dilated. - Pulmonary arteries: Systolic pressure was mildly increased. PA peak pressure: 74mm Hg    Current Outpatient Prescriptions on File Prior to Visit  Medication Sig Dispense Refill  . aspirin 81 MG tablet Take 81 mg by mouth daily.      Marland Kitchen atenolol (TENORMIN) 50 MG tablet Take 50-100 mg by mouth 2 (two) times daily. 100mg  in the morning and 50mg  at night      . Eszopiclone 3 MG TABS Take 3 mg by mouth at bedtime as needed (sleep).       . folic acid (FOLVITE) 1 MG tablet Take 1  mg by mouth daily.      Marland Kitchen levothyroxine (SYNTHROID, LEVOTHROID) 137 MCG tablet Take 137 mcg by mouth daily.      . Multiple Vitamins-Minerals (CENTRUM SILVER PO) Take 1 tablet by mouth daily.      . Omega-3 Fatty Acids (FISH OIL PO) Take 3 capsules by mouth daily.      . pravastatin (PRAVACHOL) 10 MG tablet Take 10 mg by mouth daily.      . vitamin C (ASCORBIC ACID) 500 MG tablet Take 500 mg by mouth daily.       No current facility-administered medications on file prior to visit.    Allergies  Allergen Reactions  . Phenergan [Promethazine Hcl]     hallucinations    Past Medical History  Diagnosis Date  . Hypertension   . Hypothyroidism   . Arthritis   . Concussion     Past Surgical History  Procedure Laterality Date  . I&d extremity  03/11/2012    Procedure: MINOR IRRIGATION AND DEBRIDEMENT EXTREMITY;  Surgeon: Cammie Sickle., MD;  Location: [place];  Service: Orthopedics;  Laterality: Right;  Right little finger  . Tonsillectomy    . Appendectomy    . Abdominal hysterectomy    . Trigger finger  release    . Knee arthroscopy      bilat   . Replacement total knee bilateral    . Carpal tunnel release      rt x2  . Tubal ligation    . Colonoscopy      History  Smoking status  . Never Smoker   Smokeless tobacco  . Not on file    History  Alcohol Use  . Yes    Comment: occ    Family History  Problem Relation Age of Onset  . Bladder Cancer Father     Reviw of Systems:  Reviewed in the HPI.  All other systems are negative.  Physical Exam: Blood pressure 178/78, pulse 62, height 5\' 6"  (1.676 m), weight 255 lb (115.667 kg), SpO2 98.00%. Wt Readings from Last 3 Encounters:  02/12/14 255 lb (115.667 kg)  10/30/13 245 lb (111.131 kg)  10/24/13 250 lb (113.399 kg)     General: Well developed, well nourished, in no acute distress.  Head: Normocephalic, atraumatic, sclera non-icteric, mucus membranes are moist,   Neck: Supple. Carotids  are 2 + without bruits. No JVD   Lungs: Clear   Heart: RR, normal S1S2  Abdomen: Soft, non-tender, non-distended with normal bowel sounds.  Msk:  Strength and tone are normal   Extremities: No clubbing or cyanosis. No edema.  Distal pedal pulses are 2+ and equal    Neuro: CN II - XII intact.  Alert and oriented X 3.   Psych:  Normal   ECG: October 23, 2013:  NSR at 65. No ST or T wave changes.   Assessment / Plan:

## 2014-02-12 NOTE — Assessment & Plan Note (Signed)
She was told that she may have had a small TIA. She was started on aspirin 81 mg a day. The MRI does not reveal any evidence of a stroke.  She needs to have her cataracts repaired. Dr. Mont Dutton is a bit concerned that she may be at risk for another TIA if she stops her ASA .  I think she her risk would be low if stop the aspirin for week or 2 I think this would help improve her vision. She has a fairly significant cataracts limits her vision-especially at night.  She may need to consult her neurologist for further advice regarding this.

## 2014-02-12 NOTE — Patient Instructions (Addendum)
Your physician has recommended you make the following change in your medication:  START Losartan 50 mg once daily  Your physician recommends that you return for lab work in: 1 month on Tuesday Aug. 18 at 12:10  Your physician recommends that you return for a follow-up appointment in: 3 months with Dr. Acie Fredrickson - Tuesday Oct. 20 at 3:15

## 2014-02-12 NOTE — Assessment & Plan Note (Signed)
Her BP is still elevated.  Will have her watch her salt intake.  Add Losartan 50 mg a day.  BMP in 1 month.   I will see her in 3 months for OV and BMP.

## 2014-03-16 ENCOUNTER — Other Ambulatory Visit (INDEPENDENT_AMBULATORY_CARE_PROVIDER_SITE_OTHER): Payer: Medicare Other

## 2014-03-16 DIAGNOSIS — I10 Essential (primary) hypertension: Secondary | ICD-10-CM

## 2014-03-16 LAB — BASIC METABOLIC PANEL
BUN: 15 mg/dL (ref 6–23)
CO2: 31 mEq/L (ref 19–32)
Calcium: 9.4 mg/dL (ref 8.4–10.5)
Chloride: 102 mEq/L (ref 96–112)
Creatinine, Ser: 0.8 mg/dL (ref 0.4–1.2)
GFR: 69.15 mL/min (ref >60.00)
Glucose, Bld: 90 mg/dL (ref 70–99)
Potassium: 4.1 mEq/L (ref 3.5–5.1)
Sodium: 139 mEq/L (ref 135–145)

## 2014-05-18 ENCOUNTER — Encounter: Payer: Self-pay | Admitting: Cardiovascular Disease

## 2014-05-18 ENCOUNTER — Ambulatory Visit (INDEPENDENT_AMBULATORY_CARE_PROVIDER_SITE_OTHER): Payer: Medicare Other | Admitting: Cardiovascular Disease

## 2014-05-18 VITALS — BP 148/78 | HR 55 | Ht 66.0 in | Wt 256.8 lb

## 2014-05-18 DIAGNOSIS — I10 Essential (primary) hypertension: Secondary | ICD-10-CM

## 2014-05-18 MED ORDER — LOSARTAN POTASSIUM 100 MG PO TABS: 100.0000 mg | ORAL_TABLET | Freq: Every day | ORAL | Status: DC

## 2014-05-18 NOTE — Patient Instructions (Signed)
Your physician has recommended you make the following change in your medication:  INCREASE Losartan to 100 mg once daily  Your physician recommends that you return for lab work in: 1 month for basic metabolic panel  Your physician wants you to follow-up in: 3 months with Dr. Acie Fredrickson.  You will receive a reminder letter in the mail two months in advance. If you don't receive a letter, please call our office to schedule the follow-up appointment.

## 2014-05-18 NOTE — Progress Notes (Signed)
Samantha Stone Date of Birth  1930-08-26       [facility]    [employer] [address] [place], [place], [place]  [address]   [place], [place]  [address] [place]   Fax  [phone]     Fax [phone]  Problem List: 1. Hypertension 2. Possible TIAs  - mental status changes , difficulty talking.  3. Hypothyroidism 4. Hyperlipidemia 5.   History of Present Illness:  Samantha Stone is an 80 yo who I have known for many years.  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    Samantha Stone has fallen several times over the past several months.  She slipped on the wet floor on the ceramic tile.    She presents for further management of her HTN.    Her BP has been irregular at home.   Her diet is fairly consistent.  She does not eat much salt.  She has frozen lasagna on occasion.  Hot dog once a month or so.    She was admitted to [facility] in March, 2015 for mental confusion and HTN Echo showed:  Left ventricle: The cavity size was normal. Wall thickness was at the upper limits of normal. Systolic function was normal. The estimated ejection fraction was in the range of 60% to 65%. Wall motion was normal; there were no regional wall motion abnormalities. Doppler parameters are consistent with abnormal left ventricular relaxation (grade 1 diastolic dysfunction). - Mitral valve: Mild regurgitation. - Left atrium: The atrium was moderately to severely dilated. - Pulmonary arteries: Systolic pressure was mildly increased. PA peak pressure: 19mm Hg    Oct. 20, 2015:  Samantha Stone is doing well.  She had her cataracts done.   No CP or dyspnea.    Current Outpatient Prescriptions on File Prior to Visit  Medication Sig Dispense Refill  . aspirin 81 MG tablet Take 81 mg by mouth daily.      Marland Kitchen atenolol (TENORMIN) 50 MG tablet Take 50-100 mg by mouth 2 (two) times daily. 100mg  in the morning and 50mg  at night      . Eszopiclone 3 MG TABS  Take 3 mg by mouth at bedtime as needed (sleep).       . folic acid (FOLVITE) 1 MG tablet Take 1 mg by mouth daily.      Marland Kitchen levothyroxine (SYNTHROID, LEVOTHROID) 137 MCG tablet Take 137 mcg by mouth daily.      Marland Kitchen losartan (COZAAR) 50 MG tablet Take 1 tablet (50 mg total) by mouth daily.  90 tablet  3  . Multiple Vitamins-Minerals (CENTRUM SILVER PO) Take 1 tablet by mouth daily.      . Omega-3 Fatty Acids (FISH OIL PO) Take 3 capsules by mouth daily.      . pravastatin (PRAVACHOL) 10 MG tablet Take 10 mg by mouth daily.      . vitamin C (ASCORBIC ACID) 500 MG tablet Take 500 mg by mouth daily.       No current facility-administered medications on file prior to visit.    Allergies  Allergen Reactions  . Phenergan [Promethazine Hcl]     hallucinations    Past Medical History  Diagnosis Date  . Hypertension   . Hypothyroidism   . Arthritis   . Concussion     Past Surgical History  Procedure Laterality Date  . I&d extremity  03/11/2012    Procedure: MINOR IRRIGATION AND DEBRIDEMENT  EXTREMITY;  Surgeon: Cammie Sickle., MD;  Location: [place];  Service: Orthopedics;  Laterality: Right;  Right little finger  . Tonsillectomy    . Appendectomy    . Abdominal hysterectomy    . Trigger finger release    . Knee arthroscopy      bilat   . Replacement total knee bilateral    . Carpal tunnel release      rt x2  . Tubal ligation    . Colonoscopy      History  Smoking status  . Never Smoker   Smokeless tobacco  . Not on file    History  Alcohol Use  . Yes    Comment: occ    Family History  Problem Relation Age of Onset  . Bladder Cancer Father     Reviw of Systems:  Reviewed in the HPI.  All other systems are negative.  Physical Exam: Blood pressure 148/78, pulse 55, height 5\' 6"  (1.676 m), weight 256 lb 12.8 oz (116.484 kg). Wt Readings from Last 3 Encounters:  05/18/14 256 lb 12.8 oz (116.484 kg)  02/12/14 255 lb (115.667 kg)  10/30/13 245 lb  (111.131 kg)     General: Well developed, well nourished, in no acute distress.  Head: Normocephalic, atraumatic, sclera non-icteric, mucus membranes are moist,   Neck: Supple. Carotids are 2 + without bruits. No JVD   Lungs: Clear   Heart: RR, normal S1S2  Abdomen: Soft, non-tender, non-distended with normal bowel sounds.  Msk:  Strength and tone are normal   Extremities: No clubbing or cyanosis. No edema.  Distal pedal pulses are 2+ and equal    Neuro: CN II - XII intact.  Alert and oriented X 3.   Psych:  Normal   ECG: October 23, 2013:  NSR at 26. No ST or T wave changes.   Assessment / Plan:

## 2014-05-18 NOTE — Assessment & Plan Note (Signed)
Samantha Stone is doing ok.  BP is still a bit elevated. Will increase Losartan to 100 mg a day. She will be extra careful with her salt. BMP in 3 weeks Ov in 3 months

## 2014-06-15 ENCOUNTER — Encounter (HOSPITAL_BASED_OUTPATIENT_CLINIC_OR_DEPARTMENT_OTHER): Payer: Self-pay | Admitting: Emergency Medicine

## 2014-06-15 ENCOUNTER — Observation Stay (HOSPITAL_BASED_OUTPATIENT_CLINIC_OR_DEPARTMENT_OTHER)
Admission: EM | Admit: 2014-06-15 | Discharge: 2014-06-17 | Disposition: A | Discharge status: Home / Self Care | Payer: Medicare Other | Attending: Internal Medicine | Admitting: Internal Medicine

## 2014-06-15 ENCOUNTER — Emergency Department (HOSPITAL_BASED_OUTPATIENT_CLINIC_OR_DEPARTMENT_OTHER): Payer: Medicare Other

## 2014-06-15 DIAGNOSIS — Z8673 Personal history of transient ischemic attack (TIA), and cerebral infarction without residual deficits: Secondary | ICD-10-CM | POA: Diagnosis not present

## 2014-06-15 DIAGNOSIS — R51 Headache: Secondary | ICD-10-CM

## 2014-06-15 DIAGNOSIS — G454 Transient global amnesia: Secondary | ICD-10-CM | POA: Diagnosis present

## 2014-06-15 DIAGNOSIS — E039 Hypothyroidism, unspecified: Secondary | ICD-10-CM | POA: Diagnosis present

## 2014-06-15 DIAGNOSIS — R4182 Altered mental status, unspecified: Principal | ICD-10-CM | POA: Insufficient documentation

## 2014-06-15 DIAGNOSIS — I10 Essential (primary) hypertension: Secondary | ICD-10-CM | POA: Diagnosis present

## 2014-06-15 DIAGNOSIS — E785 Hyperlipidemia, unspecified: Secondary | ICD-10-CM | POA: Diagnosis present

## 2014-06-15 DIAGNOSIS — R519 Headache, unspecified: Secondary | ICD-10-CM

## 2014-06-15 DIAGNOSIS — G9340 Encephalopathy, unspecified: Secondary | ICD-10-CM | POA: Diagnosis present

## 2014-06-15 DIAGNOSIS — R410 Disorientation, unspecified: Secondary | ICD-10-CM

## 2014-06-15 DIAGNOSIS — G459 Transient cerebral ischemic attack, unspecified: Secondary | ICD-10-CM

## 2014-06-15 LAB — COMPREHENSIVE METABOLIC PANEL
ALT: 13 U/L (ref 0–35)
AST: 29 U/L (ref 0–37)
Albumin: 3.9 g/dL (ref 3.5–5.2)
Alkaline Phosphatase: 66 U/L (ref 39–117)
Anion gap: 12 (ref 5–15)
BUN: 11 mg/dL (ref 6–23)
CO2: 26 mEq/L (ref 19–32)
Calcium: 9.8 mg/dL (ref 8.4–10.5)
Chloride: 99 mEq/L (ref 96–112)
Creatinine, Ser: 0.8 mg/dL (ref 0.50–1.10)
GFR calc Af Amer: 78 mL/min — ABNORMAL LOW (ref >90)
GFR calc non Af Amer: 67 mL/min — ABNORMAL LOW (ref >90)
Glucose, Bld: 147 mg/dL — ABNORMAL HIGH (ref 70–99)
Potassium: 4.2 mEq/L (ref 3.7–5.3)
Sodium: 137 mEq/L (ref 137–147)
Total Bilirubin: 0.7 mg/dL (ref 0.3–1.2)
Total Protein: 7 g/dL (ref 6.0–8.3)

## 2014-06-15 LAB — APTT: aPTT: 32 seconds (ref 24–37)

## 2014-06-15 LAB — PROTIME-INR
INR: 1.03 (ref 0.00–1.49)
Prothrombin Time: 13.5 seconds (ref 11.6–15.2)

## 2014-06-15 LAB — DIFFERENTIAL
Basophils Absolute: 0 10*3/uL (ref 0.0–0.1)
Basophils Relative: 0 % (ref 0–1)
Eosinophils Absolute: 0.1 10*3/uL (ref 0.0–0.7)
Eosinophils Relative: 2 % (ref 0–5)
Lymphocytes Relative: 17 % (ref 12–46)
Lymphs Abs: 1.4 10*3/uL (ref 0.7–4.0)
Monocytes Absolute: 0.7 10*3/uL (ref 0.1–1.0)
Monocytes Relative: 8 % (ref 3–12)
Neutro Abs: 6.2 10*3/uL (ref 1.7–7.7)
Neutrophils Relative %: 73 % (ref 43–77)

## 2014-06-15 LAB — CBC
HCT: 39.6 % (ref 36.0–46.0)
Hemoglobin: 13.2 g/dL (ref 12.0–15.0)
MCH: 28.9 pg (ref 26.0–34.0)
MCHC: 33.3 g/dL (ref 30.0–36.0)
MCV: 86.7 fL (ref 78.0–100.0)
Platelets: 265 10*3/uL (ref 150–400)
RBC: 4.57 MIL/uL (ref 3.87–5.11)
RDW: 13.7 % (ref 11.5–15.5)
WBC: 8.4 10*3/uL (ref 4.0–10.5)

## 2014-06-15 LAB — TROPONIN I: Troponin I: <0.3 ng/mL (ref <0.30)

## 2014-06-15 LAB — CBG MONITORING, ED: Glucose-Capillary: 146 mg/dL — ABNORMAL HIGH (ref 70–99)

## 2014-06-15 MED ORDER — ONDANSETRON HCL 4 MG/2ML IJ SOLN
INTRAMUSCULAR | Status: AC
  Administered 2014-06-15: 4 mg
  Filled 2014-06-15: qty 2

## 2014-06-15 NOTE — ED Notes (Signed)
Son states that he called to check on her this evening and he could tell she was confused.  Pt states she fell once today.

## 2014-06-15 NOTE — ED Provider Notes (Addendum)
CSN: [id number]     Arrival date & time 06/15/14  2302 History   This chart was scribed for Berle Mull, MD by Randa Evens, ED Scribe. This patient was seen in room MH07/MH07 and the patient's care was started at 11:22 PM.    Chief Complaint  Patient presents with  . Headache   Patient is a 80 y.o. female presenting with headaches. The history is provided by a relative. No language interpreter was used.  Headache  HPI Comments: Level 5 caveat: altered mental Status  Samantha Stone is a 80 y.o. female who presents to the Emergency Department complaining of an intermittent frontal headache onset at some unspecified time today. She states she also feeling nauseated. She visited her ophthalmologist as well as her attorney and was last seen normal about 4 PM by her daughter.  Son states that he noticed that she was confused on the phone this evening about 8 PM. She later called her husband's nursing home about 10 PM and the staff became very  Concerned because she sounded very concerned. They sent the sheriff over to check on her. Her son was called and he brought her here. He notes that she  Has an acute change in mental status. She is answering questions appropriately. No focal motor deficit is noted. Son states that she has recently been under a lot of stress.    Past Medical History  Diagnosis Date  . Hypertension   . Hypothyroidism   . Arthritis   . Concussion    Past Surgical History  Procedure Laterality Date  . I&d extremity  03/11/2012    Procedure: MINOR IRRIGATION AND DEBRIDEMENT EXTREMITY;  Surgeon: Cammie Sickle., MD;  Location: [facility];  Service: Orthopedics;  Laterality: Right;  Right little finger  . Tonsillectomy    . Appendectomy    . Abdominal hysterectomy    . Trigger finger release    . Knee arthroscopy      bilat   . Replacement total knee bilateral    . Carpal tunnel release      rt x2  . Tubal ligation    . Colonoscopy      Family History  Problem Relation Age of Onset  . Bladder Cancer Father    History  Substance Use Topics  . Smoking status: Never Smoker   . Smokeless tobacco: Not on file  . Alcohol Use: Yes     Comment: occ   OB History    No data available     Review of Systems  Unable to perform ROS: Mental status change    Allergies  Phenergan  Home Medications   Prior to Admission medications   Medication Sig Start Date End Date Taking? Authorizing Provider  aspirin 81 MG tablet Take 81 mg by mouth daily.    Historical Provider, MD  atenolol (TENORMIN) 50 MG tablet Take 50-100 mg by mouth 2 (two) times daily. 100mg  in the morning and 50mg  at night    Historical Provider, MD  Eszopiclone 3 MG TABS Take 3 mg by mouth at bedtime as needed (sleep).  01/29/14   Historical Provider, MD  folic acid (FOLVITE) 1 MG tablet Take 1 mg by mouth daily.    Historical Provider, MD  levothyroxine (SYNTHROID, LEVOTHROID) 137 MCG tablet Take 137 mcg by mouth daily.    Historical Provider, MD  losartan (COZAAR) 100 MG tablet Take 1 tablet (100 mg total) by mouth daily. 05/18/14   Wonda Cheng  Nahser, MD  Multiple Vitamins-Minerals (CENTRUM SILVER PO) Take 1 tablet by mouth daily.    Historical Provider, MD  Omega-3 Fatty Acids (FISH OIL PO) Take 3 capsules by mouth daily.    Historical Provider, MD  pravastatin (PRAVACHOL) 10 MG tablet Take 10 mg by mouth daily.    Historical Provider, MD  vitamin C (ASCORBIC ACID) 500 MG tablet Take 500 mg by mouth daily.    Historical Provider, MD   Triage Vitals: BP 181/58 mmHg  Pulse 53  Temp(Src) 97.8 F (36.6 C) (Oral)  Resp 18  Ht 5\' 4"  (1.626 m)  Wt 250 lb (113.399 kg)  BMI 42.89 kg/m2  SpO2 97%  Physical Exam  Nursing note and vitals reviewed.  General: Well-developed, well-nourished female in no acute distress; appearance consistent with age of record HENT: normocephalic; atraumatic Eyes: pupils equal, round and reactive to light; extraocular muscles  grossly intact but patient having difficulty following instructions Neck: supple Heart: regular rate and rhythm; no murmur Lungs: clear to auscultation bilaterally Abdomen: soft; nondistended; nontender; no masses or hepatosplenomegaly; bowel sounds present Extremities: arthritic changes; pulses normal Neurologic: Awake, but lethargic; motor function intact in all extremities and symmetric; no facial droop; sensation grossly intact but patient having difficulty identifying which extremity is being touched individually  Skin: Warm and dry   ED Course  Procedures (including critical care time) DIAGNOSTIC STUDIES: Oxygen Saturation is 97% on RA, normal by my interpretation.    COORDINATION OF CARE:    MDM    EKG Interpretation  Date/Time:  Tuesday June 15 2014 23:44:41 EST Ventricular Rate:  64 PR Interval:  158 QRS Duration: 88 QT Interval:  488 QTC Calculation: 503 R Axis:   38 Text Interpretation:  Normal sinus rhythm Possible Left atrial enlargement Prolonged QT Abnormal ECG No significant change was found Confirmed by Breccan Galant  MD, Jenny Reichmann ([id number]) on 06/16/2014 12:05:26 AM        Nursing notes and vitals signs, including pulse oximetry, reviewed.  Summary of this visit's results, reviewed by myself:  Labs:  Results for orders placed or performed during the hospital encounter of 06/15/14 (from the past 24 hour(s))  Protime-INR     Status: None   Collection Time: 06/15/14 11:20 PM  Result Value Ref Range   Prothrombin Time 13.5 11.6 - 15.2 seconds   INR 1.03 0.00 - 1.49  APTT     Status: None   Collection Time: 06/15/14 11:20 PM  Result Value Ref Range   aPTT 32 24 - 37 seconds  CBC     Status: None   Collection Time: 06/15/14 11:20 PM  Result Value Ref Range   WBC 8.4 4.0 - 10.5 K/uL   RBC 4.57 3.87 - 5.11 MIL/uL   Hemoglobin 13.2 12.0 - 15.0 g/dL   HCT 39.6 36.0 - 46.0 %   MCV 86.7 78.0 - 100.0 fL   MCH 28.9 26.0 - 34.0 pg   MCHC 33.3 30.0 - 36.0 g/dL    RDW 13.7 11.5 - 15.5 %   Platelets 265 150 - 400 K/uL  Differential     Status: None   Collection Time: 06/15/14 11:20 PM  Result Value Ref Range   Neutrophils Relative % 73 43 - 77 %   Neutro Abs 6.2 1.7 - 7.7 K/uL   Lymphocytes Relative 17 12 - 46 %   Lymphs Abs 1.4 0.7 - 4.0 K/uL   Monocytes Relative 8 3 - 12 %   Monocytes Absolute 0.7 0.1 -  1.0 K/uL   Eosinophils Relative 2 0 - 5 %   Eosinophils Absolute 0.1 0.0 - 0.7 K/uL   Basophils Relative 0 0 - 1 %   Basophils Absolute 0.0 0.0 - 0.1 K/uL  Comprehensive metabolic panel     Status: Abnormal   Collection Time: 06/15/14 11:20 PM  Result Value Ref Range   Sodium 137 137 - 147 mEq/L   Potassium 4.2 3.7 - 5.3 mEq/L   Chloride 99 96 - 112 mEq/L   CO2 26 19 - 32 mEq/L   Glucose, Bld 147 (H) 70 - 99 mg/dL   BUN 11 6 - 23 mg/dL   Creatinine, Ser 0.80 0.50 - 1.10 mg/dL   Calcium 9.8 8.4 - 10.5 mg/dL   Total Protein 7.0 6.0 - 8.3 g/dL   Albumin 3.9 3.5 - 5.2 g/dL   AST 29 0 - 37 U/L   ALT 13 0 - 35 U/L   Alkaline Phosphatase 66 39 - 117 U/L   Total Bilirubin 0.7 0.3 - 1.2 mg/dL   GFR calc non Af Amer 67 (L) >90 mL/min   GFR calc Af Amer 78 (L) >90 mL/min   Anion gap 12 5 - 15  Troponin I     Status: None   Collection Time: 06/15/14 11:20 PM  Result Value Ref Range   Troponin I <0.30 <0.30 ng/mL  CBG monitoring, ED     Status: Abnormal   Collection Time: 06/15/14 11:54 PM  Result Value Ref Range   Glucose-Capillary 146 (H) 70 - 99 mg/dL  Urinalysis, Routine w reflex microscopic     Status: Abnormal   Collection Time: 06/16/14  1:10 AM  Result Value Ref Range   Color, Urine YELLOW YELLOW   APPearance CLOUDY (A) CLEAR   Specific Gravity, Urine 1.014 1.005 - 1.030   pH 8.0 5.0 - 8.0   Glucose, UA NEGATIVE NEGATIVE mg/dL   Hgb urine dipstick NEGATIVE NEGATIVE   Bilirubin Urine NEGATIVE NEGATIVE   Ketones, ur 15 (A) NEGATIVE mg/dL   Protein, ur NEGATIVE NEGATIVE mg/dL   Urobilinogen, UA 0.2 0.0 - 1.0 mg/dL   Nitrite  NEGATIVE NEGATIVE   Leukocytes, UA NEGATIVE NEGATIVE  Drug screen panel, emergency     Status: None   Collection Time: 06/16/14  1:10 AM  Result Value Ref Range   Opiates NONE DETECTED NONE DETECTED   Cocaine NONE DETECTED NONE DETECTED   Benzodiazepines NONE DETECTED NONE DETECTED   Amphetamines NONE DETECTED NONE DETECTED   Tetrahydrocannabinol NONE DETECTED NONE DETECTED   Barbiturates NONE DETECTED NONE DETECTED    Imaging Studies: Ct Head Wo Contrast  06/15/2014   CLINICAL DATA:  Headache, confusion, and difficulty following commands for 4 hr.  EXAM: CT HEAD WITHOUT CONTRAST  TECHNIQUE: Contiguous axial images were obtained from the base of the skull through the vertex without intravenous contrast.  COMPARISON:  MRI brain 10/24/2013.  CT head 10/22/2013.  FINDINGS: Diffuse cerebral atrophy. Patchy white matter changes consistent with small vessel ischemia. No mass effect or midline shift. No abnormal extra-axial fluid collections. Gray-white matter junctions are distinct. Basal cisterns are not effaced. No evidence of acute intracranial hemorrhage. No depressed skull fractures. Visualized paranasal sinuses and mastoid air cells are not opacified.  IMPRESSION: No acute intracranial abnormalities. Chronic atrophy and small vessel ischemic changes.   Electronically Signed   By: Lucienne Capers M.D.   On: 06/15/2014 23:58   12:10 AM Suspect acute CVA. Will; have patient admitted.  Final diagnoses:  Frontal headache  Acute confusion    I personally performed the services described in this documentation, which was scribed in my presence. The recorded information has been reviewed and is accurate.    Wynetta Fines, MD 06/16/14 0010  Wynetta Fines, MD 06/16/14 9480

## 2014-06-16 ENCOUNTER — Inpatient Hospital Stay (HOSPITAL_COMMUNITY): Payer: Medicare Other

## 2014-06-16 DIAGNOSIS — E785 Hyperlipidemia, unspecified: Secondary | ICD-10-CM | POA: Diagnosis present

## 2014-06-16 DIAGNOSIS — G9340 Encephalopathy, unspecified: Secondary | ICD-10-CM | POA: Diagnosis present

## 2014-06-16 DIAGNOSIS — R519 Headache, unspecified: Secondary | ICD-10-CM | POA: Diagnosis not present

## 2014-06-16 DIAGNOSIS — G458 Other transient cerebral ischemic attacks and related syndromes: Secondary | ICD-10-CM

## 2014-06-16 DIAGNOSIS — I10 Essential (primary) hypertension: Secondary | ICD-10-CM

## 2014-06-16 DIAGNOSIS — R410 Disorientation, unspecified: Secondary | ICD-10-CM | POA: Insufficient documentation

## 2014-06-16 DIAGNOSIS — R51 Headache: Secondary | ICD-10-CM

## 2014-06-16 DIAGNOSIS — E039 Hypothyroidism, unspecified: Secondary | ICD-10-CM | POA: Diagnosis present

## 2014-06-16 LAB — AMMONIA: Ammonia: 42 umol/L (ref 11–60)

## 2014-06-16 LAB — TSH: TSH: 0.321 u[IU]/mL — ABNORMAL LOW (ref 0.350–4.500)

## 2014-06-16 LAB — URINALYSIS, ROUTINE W REFLEX MICROSCOPIC
Bilirubin Urine: NEGATIVE
Glucose, UA: NEGATIVE mg/dL
Hgb urine dipstick: NEGATIVE
Ketones, ur: 15 mg/dL — AB
Leukocytes, UA: NEGATIVE
Nitrite: NEGATIVE
Protein, ur: NEGATIVE mg/dL
Specific Gravity, Urine: 1.014 (ref 1.005–1.030)
Urobilinogen, UA: 0.2 mg/dL (ref 0.0–1.0)
pH: 8 (ref 5.0–8.0)

## 2014-06-16 LAB — HEMOGLOBIN A1C
Hgb A1c MFr Bld: 5.5 % (ref <5.7)
Mean Plasma Glucose: 111 mg/dL (ref <117)

## 2014-06-16 LAB — RAPID URINE DRUG SCREEN, HOSP PERFORMED
Amphetamines: NOT DETECTED
Barbiturates: NOT DETECTED
Benzodiazepines: NOT DETECTED
Cocaine: NOT DETECTED
Opiates: NOT DETECTED
Tetrahydrocannabinol: NOT DETECTED

## 2014-06-16 LAB — LIPID PANEL
Cholesterol: 154 mg/dL (ref 0–200)
HDL: 55 mg/dL (ref >39)
LDL Cholesterol: 87 mg/dL (ref 0–99)
Total CHOL/HDL Ratio: 2.8 RATIO
Triglycerides: 59 mg/dL (ref <150)
VLDL: 12 mg/dL (ref 0–40)

## 2014-06-16 LAB — VITAMIN B12: Vitamin B-12: 599 pg/mL (ref 211–911)

## 2014-06-16 MED ORDER — ONDANSETRON HCL 4 MG/2ML IJ SOLN: 4.0000 mg | Freq: Three times a day (TID) | INTRAMUSCULAR | Status: DC | PRN

## 2014-06-16 MED ORDER — LEVOTHYROXINE SODIUM 25 MCG PO TABS: 137.0000 ug | ORAL_TABLET | Freq: Every day | ORAL | Status: DC

## 2014-06-16 MED ORDER — SENNOSIDES-DOCUSATE SODIUM 8.6-50 MG PO TABS: 1.0000 | ORAL_TABLET | Freq: Every evening | ORAL | Status: DC | PRN

## 2014-06-16 MED ORDER — SODIUM CHLORIDE 0.9 % IV SOLN: INTRAVENOUS | Status: DC

## 2014-06-16 MED ORDER — FOLIC ACID 1 MG PO TABS
1.0000 mg | ORAL_TABLET | Freq: Every day | ORAL | Status: DC
  Administered 2014-06-16 – 2014-06-17 (×2): 1 mg via ORAL
  Filled 2014-06-16 (×3): qty 1

## 2014-06-16 MED ORDER — LORAZEPAM 2 MG/ML IJ SOLN: 1.0000 mg | Freq: Once | INTRAMUSCULAR | Status: AC

## 2014-06-16 MED ORDER — HEPARIN SODIUM (PORCINE) 5000 UNIT/ML IJ SOLN
5000.0000 [IU] | Freq: Three times a day (TID) | INTRAMUSCULAR | Status: DC
  Administered 2014-06-16 – 2014-06-17 (×3): 5000 [IU] via SUBCUTANEOUS
  Filled 2014-06-16 (×2): qty 1

## 2014-06-16 MED ORDER — LORAZEPAM 2 MG/ML IJ SOLN
2.0000 mg | Freq: Once | INTRAMUSCULAR | Status: AC
  Administered 2014-06-16: 2 mg via INTRAVENOUS
  Filled 2014-06-16: qty 1

## 2014-06-16 MED ORDER — OMEGA-3-ACID ETHYL ESTERS 1 G PO CAPS
1.0000 g | ORAL_CAPSULE | Freq: Every day | ORAL | Status: DC
  Administered 2014-06-16 – 2014-06-17 (×2): 1 g via ORAL
  Filled 2014-06-16 (×2): qty 1

## 2014-06-16 MED ORDER — VITAMIN C 500 MG PO TABS
500.0000 mg | ORAL_TABLET | Freq: Every day | ORAL | Status: DC
Start: 2014-06-16 — End: 2014-06-17
  Administered 2014-06-16 – 2014-06-17 (×2): 500 mg via ORAL
  Filled 2014-06-16 (×2): qty 1

## 2014-06-16 MED ORDER — ASPIRIN 81 MG PO CHEW
324.0000 mg | CHEWABLE_TABLET | Freq: Every day | ORAL | Status: DC
  Administered 2014-06-16 – 2014-06-17 (×2): 324 mg via ORAL
  Filled 2014-06-16 (×2): qty 4

## 2014-06-16 MED ORDER — PRAVASTATIN SODIUM 20 MG PO TABS
10.0000 mg | ORAL_TABLET | Freq: Every day | ORAL | Status: DC
Start: 2014-06-16 — End: 2014-06-17
  Administered 2014-06-16: 10 mg via ORAL
  Filled 2014-06-16: qty 0.5

## 2014-06-16 MED ORDER — ASPIRIN 81 MG PO CHEW: 81.0000 mg | CHEWABLE_TABLET | Freq: Every day | ORAL | Status: DC

## 2014-06-16 MED ORDER — LEVOTHYROXINE SODIUM 112 MCG PO TABS
112.0000 ug | ORAL_TABLET | Freq: Every day | ORAL | Status: DC
  Administered 2014-06-16 – 2014-06-17 (×2): 112 ug via ORAL
  Filled 2014-06-16 (×3): qty 1

## 2014-06-16 MED ORDER — ZOLPIDEM TARTRATE 5 MG PO TABS: 5.0000 mg | ORAL_TABLET | Freq: Every evening | ORAL | Status: DC | PRN

## 2014-06-16 MED ORDER — ADULT MULTIVITAMIN W/MINERALS CH
1.0000 | ORAL_TABLET | Freq: Every day | ORAL | Status: DC
  Administered 2014-06-16 – 2014-06-17 (×2): 1 via ORAL
  Filled 2014-06-16 (×4): qty 1

## 2014-06-16 MED ORDER — STROKE: EARLY STAGES OF RECOVERY BOOK
Freq: Once | Status: AC
Start: 2014-06-16 — End: 2014-06-16
  Administered 2014-06-16: 04:00:00
  Filled 2014-06-16: qty 1

## 2014-06-16 MED ORDER — ACETAMINOPHEN 325 MG PO TABS: 650.0000 mg | ORAL_TABLET | Freq: Four times a day (QID) | ORAL | Status: DC | PRN

## 2014-06-16 NOTE — Progress Notes (Signed)
UR Completed Steffanie Mingle Graves-Bigelow, RN,BSN [phone]  

## 2014-06-16 NOTE — Progress Notes (Signed)
EEG Completed; Results Pending  

## 2014-06-16 NOTE — H&P (Signed)
[facility] History and Physical  Samantha Stone WNI:[id number] DOB: [birth year] DOA: 06/15/2014  Referring physician: ED physician PCP: Abigail Miyamoto, MD  Specialists:   Chief Complaint: altered mental status and headache  HPI: Samantha Stone is a 80 y.o. female with past medical history of hypothyroidism, hypertension, history of TIA, hyperlipidemia, who presented with altered mental status and headache.  Patient reports that at about a 4 to 5:00 PM, she started having severe headache. She also had dizziness. She had very poor balance. she did not have vision change, hearing loss, weakness and numbness or tingling sensations in her extremities. She does not have ear ringing. No feeling of room spinning around her. Around 10pm her son noticed that she was very disoriented, could not  recogniz him, did not know the date. Her mental status has been gradually improving, but not back to her baseline yet per her son. Of note, she was admitted due to possible TIA in 09/2013. MRI/A at that time was unremarkable except for MCA atherosclerosis. Patient feels nauseated. She does not have fever, chills, shortness of breath, chest pain, abdominal pain, diarrhea, rashes.Per patients son,  she has been under a lot of stress recently. She has not been sleeping or eating well.  CT head is negative for acute abnormalities. UDS negative. Troponin negative. Urinalysis negative. Patient is admitted to inpatient for further evaluation and treatment.  Review of Systems: As presented in the history of presenting illness, rest negative.  Where does patient live? At home  Can patient participate in ADLs? Yes  Allergy:  Allergies  Allergen Reactions  . Phenergan [Promethazine Hcl]     hallucinations    Past Medical History  Diagnosis Date  . Hypertension   . Hypothyroidism   . Arthritis   . Concussion     Past Surgical History  Procedure Laterality Date  . I&d extremity  03/11/2012   Procedure: MINOR IRRIGATION AND DEBRIDEMENT EXTREMITY;  Surgeon: Cammie Sickle., MD;  Location: [facility];  Service: Orthopedics;  Laterality: Right;  Right little finger  . Tonsillectomy    . Appendectomy    . Abdominal hysterectomy    . Trigger finger release    . Knee arthroscopy      bilat   . Replacement total knee bilateral    . Carpal tunnel release      rt x2  . Tubal ligation    . Colonoscopy      Social History:  reports that she has never smoked. She does not have any smokeless tobacco history on file. She reports that she drinks alcohol. She reports that she does not use illicit drugs.  Family History:  Family History  Problem Relation Age of Onset  . Bladder Cancer Father      Prior to Admission medications   Medication Sig Start Date End Date Taking? Authorizing Provider  aspirin 81 MG tablet Take 81 mg by mouth daily.    Historical Provider, MD  atenolol (TENORMIN) 50 MG tablet Take 50-100 mg by mouth 2 (two) times daily. 100mg  in the morning and 50mg  at night    Historical Provider, MD  Eszopiclone 3 MG TABS Take 3 mg by mouth at bedtime as needed (sleep).  01/29/14   Historical Provider, MD  folic acid (FOLVITE) 1 MG tablet Take 1 mg by mouth daily.    Historical Provider, MD  levothyroxine (SYNTHROID, LEVOTHROID) 137 MCG tablet Take 137 mcg by mouth daily.    Historical Provider,  MD  losartan (COZAAR) 100 MG tablet Take 1 tablet (100 mg total) by mouth daily. 05/18/14   Thayer Headings, MD  Multiple Vitamins-Minerals (CENTRUM SILVER PO) Take 1 tablet by mouth daily.    Historical Provider, MD  Omega-3 Fatty Acids (FISH OIL PO) Take 3 capsules by mouth daily.    Historical Provider, MD  pravastatin (PRAVACHOL) 10 MG tablet Take 10 mg by mouth daily.    Historical Provider, MD  vitamin C (ASCORBIC ACID) 500 MG tablet Take 500 mg by mouth daily.    Historical Provider, MD    Physical Exam: Filed Vitals:   06/16/14 0030 06/16/14 0045 06/16/14  0204 06/16/14 0208  BP: 173/79 169/78  169/60  Pulse: 60 63  68  Temp:    98.1 F (36.7 C)  TempSrc:    Oral  Resp: 18 17  18   Height:   5\' 4"  (1.626 m)   Weight:   112.447 kg (247 lb 14.4 oz)   SpO2: 97% 96%  100%   General: Not in acute distress HEENT:       Eyes: PERRL, EOMI, no scleral icterus       ENT: No discharge from the ears and nose, no pharynx injection, no tonsillar enlargement.        Neck: No JVD, no bruit, no mass felt. Cardiac: S1/S2, RRR, No murmurs, No gallops or rubs Pulm: Good air movement bilaterally. Clear to auscultation bilaterally. No rales, wheezing, rhonchi or rubs. Abd: Soft, nondistended, nontender, no rebound pain, no organomegaly, BS present Ext: No edema bilaterally. 2+DP/PT pulse bilaterally Musculoskeletal: No joint deformities, erythema, or stiffness, ROM full Skin: No rashes.  Neuro: Alert and oriented X3, cranial nerves II-XII grossly intact, muscle strength 5/5 in all extremeties, sensation to light touch intact. Brachial reflex 2+ bilaterally. Knee reflex 1+ bilaterally. Negative Babinski's sign. Normal finger to nose test. Psych: Patient is not psychotic, no suicidal or hemocidal ideation.  Labs on Admission:  Basic Metabolic Panel:  Recent Labs Lab 06/15/14 2320  NA 137  K 4.2  CL 99  CO2 26  GLUCOSE 147*  BUN 11  CREATININE 0.80  CALCIUM 9.8   Liver Function Tests:  Recent Labs Lab 06/15/14 2320  AST 29  ALT 13  ALKPHOS 66  BILITOT 0.7  PROT 7.0  ALBUMIN 3.9   No results for input(s): LIPASE, AMYLASE in the last 168 hours. No results for input(s): AMMONIA in the last 168 hours. CBC:  Recent Labs Lab 06/15/14 2320  WBC 8.4  NEUTROABS 6.2  HGB 13.2  HCT 39.6  MCV 86.7  PLT 265   Cardiac Enzymes:  Recent Labs Lab 06/15/14 2320  TROPONINI <0.30    BNP (last 3 results) No results for input(s): PROBNP in the last 8760 hours. CBG:  Recent Labs Lab 06/15/14 2354  GLUCAP 146*    Radiological Exams on  Admission: Ct Head Wo Contrast  06/15/2014   CLINICAL DATA:  Headache, confusion, and difficulty following commands for 4 hr.  EXAM: CT HEAD WITHOUT CONTRAST  TECHNIQUE: Contiguous axial images were obtained from the base of the skull through the vertex without intravenous contrast.  COMPARISON:  MRI brain 10/24/2013.  CT head 10/22/2013.  FINDINGS: Diffuse cerebral atrophy. Patchy white matter changes consistent with small vessel ischemia. No mass effect or midline shift. No abnormal extra-axial fluid collections. Gray-white matter junctions are distinct. Basal cisterns are not effaced. No evidence of acute intracranial hemorrhage. No depressed skull fractures. Visualized paranasal sinuses and mastoid  air cells are not opacified.  IMPRESSION: No acute intracranial abnormalities. Chronic atrophy and small vessel ischemic changes.   Electronically Signed   By: Lucienne Capers M.D.   On: 06/15/2014 23:58    EKG: Independently reviewed.   Assessment/Plan Principal Problem:   Acute encephalopathy Active Problems:   TIA (transient ischemic attack)   Essential hypertension   Frontal headache   HLD (hyperlipidemia)   Hypothyroidism   Acute encephalopathy: neurology was consulted, consider differential DD of hypertensive encephalopathy vs CVA/TIA. Per Dr. Janann Colonel, Johnnye Sima dose taken at 7pm could also be contributing. Seizure and post-ictal state would also be a concern though she has no known seizure history or trigger. No signs of infectious etiology.  - will admit to tele bed - follow dr. Hazle Quant recs - MRI brain ( before Dr. Janann Colonel saw patient, I already ordered MRI/MRA because I am more concerned about CVA/TIA) - check TSH, ammonia, B12  - hold lunesta for now - will hold on EEG for now - increased ASA to 325mg  daily per Dr. Janann Colonel   HTN: bp is 169/60 on admission -will hold bp med until MRI done  HLD:  -continue pravastatin  Hypothyroidism: On Synthroid at home. -We'll continue  Synthroid -Check TSH   DVT ppx: SQ Heparin    Code Status: Full code Family Communication: Yes, patient's  son  at bed side Disposition Plan: Admit to inpatient   Date of Service 06/16/2014    Ivor Costa [facility] Pager [phone]  If 7PM-7AM, please contact night-coverage [email or web address] Password [email or web address] 06/16/2014, 3:32 AM

## 2014-06-16 NOTE — Consult Note (Signed)
Consult Reason for Consult:Altered mental status Referring Physician: Dr Blaine Hamper  CC:Altered mental status  HPI: Samantha Stone is an 80 y.o. female transferred from [facility] for altered mental status. Per patients son she has been under a lot of stress the last few days, has not been sleeping or eating. Today he called her around 8pm, noted she appeared confused, told her to "relax" and he would call her in the morning. Apparently around 10pm she called the NH where here husband lives and appeared very disoriented. The NH staff called the sheriff who called the son. Upon arriving the son notes she was very disoriented, barely recognized him, did not know the date, speech did not make sense. This has been gradually improving but son notes she is not at her baseline. No automatisms or abnormal movements noted during this time. Son notes a similar episode in March 2015. Was admitted to [facility] with unremarkable workup, discharge diagnosis of TIA. MRI/A at that time was overall unremarkable with L>R MCA atherosclerosis.   Has baseline hypertension for which she is followed by Dr Cathie Olden. Since arrival SBP range from 151 to 176. Patient notes she took a Lunesta around 7pm prior to symptom onset. Has been on lunesta for years with no adverse effects.   She notes a dull aching frontal headache. No visual changes. No nausea or emesis.   Patient appears very anxious, perseverates on difficulties with her daughter-in law. Her son reports this problem has caused a lot of stress to his mother over the past few days/weeks.   Past Medical History  Diagnosis Date  . Hypertension   . Hypothyroidism   . Arthritis   . Concussion     Past Surgical History  Procedure Laterality Date  . I&d extremity  03/11/2012    Procedure: MINOR IRRIGATION AND DEBRIDEMENT EXTREMITY;  Surgeon: Cammie Sickle., MD;  Location: [facility];  Service: Orthopedics;  Laterality: Right;  Right little finger  . Tonsillectomy     . Appendectomy    . Abdominal hysterectomy    . Trigger finger release    . Knee arthroscopy      bilat   . Replacement total knee bilateral    . Carpal tunnel release      rt x2  . Tubal ligation    . Colonoscopy      Family History  Problem Relation Age of Onset  . Bladder Cancer Father     Social History:  reports that she has never smoked. She does not have any smokeless tobacco history on file. She reports that she drinks alcohol. She reports that she does not use illicit drugs.  Allergies  Allergen Reactions  . Phenergan [Promethazine Hcl]     hallucinations    Medications:  Prior to Admission:  Prescriptions prior to admission  Medication Sig Dispense Refill Last Dose  . aspirin 81 MG tablet Take 81 mg by mouth daily.   Taking  . atenolol (TENORMIN) 50 MG tablet Take 50-100 mg by mouth 2 (two) times daily. 100mg  in the morning and 50mg  at night   Taking  . Eszopiclone 3 MG TABS Take 3 mg by mouth at bedtime as needed (sleep).    Taking  . folic acid (FOLVITE) 1 MG tablet Take 1 mg by mouth daily.   Taking  . levothyroxine (SYNTHROID, LEVOTHROID) 137 MCG tablet Take 137 mcg by mouth daily.   Taking  . losartan (COZAAR) 100 MG tablet Take 1 tablet (100 mg  total) by mouth daily. 31 tablet 11   . Multiple Vitamins-Minerals (CENTRUM SILVER PO) Take 1 tablet by mouth daily.   Taking  . Omega-3 Fatty Acids (FISH OIL PO) Take 3 capsules by mouth daily.   Taking  . pravastatin (PRAVACHOL) 10 MG tablet Take 10 mg by mouth daily.   Taking  . vitamin C (ASCORBIC ACID) 500 MG tablet Take 500 mg by mouth daily.   Taking    Head CT reviewed and unremarkable.   ROS: Out of a complete 14 system review, the patient complains of only the following symptoms, and all other reviewed systems are negative. + confusion, anxiety  Physical Examination: Blood pressure 169/60, pulse 68, temperature 98.1 F (36.7 C), temperature source Oral, resp. rate 18, height 5\' 4"  (1.626 m), weight  112.447 kg (247 lb 14.4 oz), SpO2 100 %.  Neurologic Examination Mental Status: Alert, oriented, to name, "hospital" and date. 3/3 immediate recall, 0/3 at 5 minutes. Mild expressive and receptive aphasia. Difficulty with repeating. Minimally able to describe "cookie theft" picture. Able to follow one step command, difficulty with multi-step commands.  Cranial Nerves: II: funduscopic exam wnl bilaterally, blinks to threat bilaterally, pupils equal, round, reactive to light III,IV, VI: ptosis not present, extra-ocular motions intact bilaterally V,VII: smile symmetric, facial light touch sensation normal bilaterally VIII: hearing normal bilaterally IX,X: gag reflex present XI: trapezius strength/neck flexion strength normal bilaterally XII: tongue strength normal  Motor: Right : Upper extremity    Left:     Upper extremity 5/5 deltoid       5/5 deltoid 5/5 biceps      5/5 biceps  5/5 triceps      5/5 triceps 5/5 hand grip      5/5 hand grip  Lower extremity     Lower extremity 5/5 hip flexor      5/5 hip flexor 5/5 quadricep      5/5 quadriceps  5/5 hamstrings     5/5 hamstrings 5/5 plantar flexion       5/5 plantar flexion 5/5 plantar extension     5/5 plantar extension Tone and bulk:normal tone throughout; no atrophy noted Sensory: Pinprick and light touch intact throughout, bilaterally Deep Tendon Reflexes: 2+ and symmetric throughout, 1+ AJs bilaterally Plantars: Right: downgoing   Left: downgoing Cerebellar: FTN wnl bilaterally Gait: deferred due to mental status  Laboratory Studies:   Basic Metabolic Panel:  Recent Labs Lab 06/15/14 2320  NA 137  K 4.2  CL 99  CO2 26  GLUCOSE 147*  BUN 11  CREATININE 0.80  CALCIUM 9.8    Liver Function Tests:  Recent Labs Lab 06/15/14 2320  AST 29  ALT 13  ALKPHOS 66  BILITOT 0.7  PROT 7.0  ALBUMIN 3.9   No results for input(s): LIPASE, AMYLASE in the last 168 hours. No results for input(s): AMMONIA in the last 168  hours.  CBC:  Recent Labs Lab 06/15/14 2320  WBC 8.4  NEUTROABS 6.2  HGB 13.2  HCT 39.6  MCV 86.7  PLT 265    Cardiac Enzymes:  Recent Labs Lab 06/15/14 2320  TROPONINI <0.30    BNP: Invalid input(s): POCBNP  CBG:  Recent Labs Lab 06/15/14 2354  GLUCAP 146*    Microbiology: Results for orders placed or performed during the hospital encounter of 03/25/12  Anaerobic culture     Status: None   Collection Time: 03/25/12 10:41 AM  Result Value Ref Range Status   Specimen Description ABSCESS HAND RIGHT  Final   Special Requests RIGHT SMALL FINGER  Final   Gram Stain   Final    MODERATE WBC PRESENT, PREDOMINANTLY PMN NO SQUAMOUS EPITHELIAL CELLS SEEN NO ORGANISMS SEEN   Culture NO ANAEROBES ISOLATED  Final   Report Status 03/30/2012 FINAL  Final  Culture, routine-abscess     Status: None   Collection Time: 03/25/12 10:41 AM  Result Value Ref Range Status   Specimen Description ABSCESS HAND RIGHT  Final   Special Requests RIGHT SMALL FINGER  Final   Gram Stain   Final    MODERATE WBC PRESENT, PREDOMINANTLY PMN NO SQUAMOUS EPITHELIAL CELLS SEEN NO ORGANISMS SEEN   Culture NO GROWTH 3 DAYS  Final   Report Status 03/28/2012 FINAL  Final    Coagulation Studies:  Recent Labs  06/15/14 2320  LABPROT 13.5  INR 1.03    Urinalysis:  Recent Labs Lab 06/16/14 0110  COLORURINE YELLOW  LABSPEC 1.014  PHURINE 8.0  GLUCOSEU NEGATIVE  HGBUR NEGATIVE  BILIRUBINUR NEGATIVE  KETONESUR 15*  PROTEINUR NEGATIVE  UROBILINOGEN 0.2  NITRITE NEGATIVE  LEUKOCYTESUR NEGATIVE    Lipid Panel:     Component Value Date/Time   CHOL 176 10/24/2013 0555   TRIG 105 10/24/2013 0555   HDL 44 10/24/2013 0555   CHOLHDL 4.0 10/24/2013 0555   VLDL 21 10/24/2013 0555   LDLCALC 111* 10/24/2013 0555    HgbA1C:  Lab Results  Component Value Date   HGBA1C 5.7* 10/24/2013    Urine Drug Screen:     Component Value Date/Time   LABOPIA NONE DETECTED 06/16/2014 0110    COCAINSCRNUR NONE DETECTED 06/16/2014 0110   LABBENZ NONE DETECTED 06/16/2014 0110   AMPHETMU NONE DETECTED 06/16/2014 0110   THCU NONE DETECTED 06/16/2014 0110   LABBARB NONE DETECTED 06/16/2014 0110    Alcohol Level: No results for input(s): ETH in the last 168 hours.   Imaging: Ct Head Wo Contrast  06/15/2014   CLINICAL DATA:  Headache, confusion, and difficulty following commands for 4 hr.  EXAM: CT HEAD WITHOUT CONTRAST  TECHNIQUE: Contiguous axial images were obtained from the base of the skull through the vertex without intravenous contrast.  COMPARISON:  MRI brain 10/24/2013.  CT head 10/22/2013.  FINDINGS: Diffuse cerebral atrophy. Patchy white matter changes consistent with small vessel ischemia. No mass effect or midline shift. No abnormal extra-axial fluid collections. Gray-white matter junctions are distinct. Basal cisterns are not effaced. No evidence of acute intracranial hemorrhage. No depressed skull fractures. Visualized paranasal sinuses and mastoid air cells are not opacified.  IMPRESSION: No acute intracranial abnormalities. Chronic atrophy and small vessel ischemic changes.   Electronically Signed   By: Lucienne Capers M.D.   On: 06/15/2014 23:58     Assessment/Plan:  80y/o woman presenting with acute onset encephalopathy. Differential would include a hypertensive encephalopathy vs CVA/TIA. As exam is overall non-focal would favor hypertensive encephalopathy as the etiology. Lunesta dose taken at 7pm could also be contributing. Seizure and post-ictal state would also be a concern though she has no known seizure history or trigger. No signs of infectious etiology.  -MRI brain -check TSH, ammonia, B12 -gradual lowering of blood pressure  -hold lunesta for now -will hold on EEG for now -increase ASA to 325mg  daily  Jim Like, DO [facility] [phone]  If 7pm- 7am, please page neurology on call as listed in [place]. 06/16/2014, 2:31 AM

## 2014-06-16 NOTE — Progress Notes (Signed)
Subjective: patient remains confused.  Becomes very upset when talking about her husband.  She is able to start a thought but then her thought process becomes blocked and she cannot finish.  She is aware she is at [facility] but unable to tell me the events leading to hospitalization.  She is aware a similar event occurred in March and she had a negative work up.  She tells me Dr. Cathie Olden follows her out patient.   Objective: Current vital signs: BP 111/40 mmHg  Pulse 64  Temp(Src) 98.1 F (36.7 C) (Oral)  Resp 16  Ht 5\' 4"  (1.626 m)  Wt 112.447 kg (247 lb 14.4 oz)  BMI 42.53 kg/m2  SpO2 92% Vital signs in last 24 hours: Temp:  [97.8 F (36.6 C)-98.1 F (36.7 C)] 98.1 F (36.7 C) (11/18 0400) Pulse Rate:  [53-68] 64 (11/18 0400) Resp:  [16-20] 16 (11/18 0400) BP: (111-181)/(40-85) 111/40 mmHg (11/18 0829) SpO2:  [92 %-100 %] 92 % (11/18 0829) Weight:  [112.447 kg (247 lb 14.4 oz)-113.399 kg (250 lb)] 112.447 kg (247 lb 14.4 oz) (11/18 0204)  Intake/Output from previous day:   Intake/Output this shift:   Nutritional status: Diet Heart  Neurologic Exam: Mental Status: Alert, oriented, to name,[place], November, unable to state year, her birth date and her age. 3/3 immediate recall, 0/3 at 5 minutes. Mild expressive but no receptive aphasia. Able to follow simple commands Cranial Nerves: II: vision intact bilaterally, pupils equal, round, reactive to light III,IV, VI: ptosis not present, extra-ocular motions intact bilaterally V,VII: smile symmetric, facial light touch sensation normal bilaterally VIII: hearing normal bilaterally IX,X: gag reflex present XI: trapezius strength/neck flexion strength normal bilaterally XII: tongue strength normal  Motor: 5/5 throughout Sensory: Pinprick and light touch intact throughout, bilaterally Deep Tendon Reflexes: 2+ and symmetric throughout, 1+ AJs bilaterally Plantars: Right: downgoingLeft:  downgoing  Lab Results: Basic Metabolic Panel:  Recent Labs Lab 06/15/14 2320  NA 137  K 4.2  CL 99  CO2 26  GLUCOSE 147*  BUN 11  CREATININE 0.80  CALCIUM 9.8    Liver Function Tests:  Recent Labs Lab 06/15/14 2320  AST 29  ALT 13  ALKPHOS 66  BILITOT 0.7  PROT 7.0  ALBUMIN 3.9   No results for input(s): LIPASE, AMYLASE in the last 168 hours.  Recent Labs Lab 06/16/14 0429  AMMONIA 42    CBC:  Recent Labs Lab 06/15/14 2320  WBC 8.4  NEUTROABS 6.2  HGB 13.2  HCT 39.6  MCV 86.7  PLT 265    Cardiac Enzymes:  Recent Labs Lab 06/15/14 2320  TROPONINI <0.30    Lipid Panel:  Recent Labs Lab 06/16/14 0429  CHOL 154  TRIG 59  HDL 55  CHOLHDL 2.8  VLDL 12  LDLCALC 87    CBG:  Recent Labs Lab 06/15/14 2354  GLUCAP 146*    Microbiology: Results for orders placed or performed during the hospital encounter of 03/25/12  Anaerobic culture     Status: None   Collection Time: 03/25/12 10:41 AM  Result Value Ref Range Status   Specimen Description ABSCESS HAND RIGHT  Final   Special Requests RIGHT SMALL FINGER  Final   Gram Stain   Final    MODERATE WBC PRESENT, PREDOMINANTLY PMN NO SQUAMOUS EPITHELIAL CELLS SEEN NO ORGANISMS SEEN   Culture NO ANAEROBES ISOLATED  Final   Report Status 03/30/2012 FINAL  Final  Culture, routine-abscess     Status: None   Collection Time:  03/25/12 10:41 AM  Result Value Ref Range Status   Specimen Description ABSCESS HAND RIGHT  Final   Special Requests RIGHT SMALL FINGER  Final   Gram Stain   Final    MODERATE WBC PRESENT, PREDOMINANTLY PMN NO SQUAMOUS EPITHELIAL CELLS SEEN NO ORGANISMS SEEN   Culture NO GROWTH 3 DAYS  Final   Report Status 03/28/2012 FINAL  Final    Coagulation Studies:  Recent Labs  06/15/14 2320  LABPROT 13.5  INR 1.03    Imaging: Ct Head Wo Contrast  06/15/2014   CLINICAL DATA:  Headache, confusion, and difficulty following commands for 4 hr.  EXAM: CT HEAD WITHOUT  CONTRAST  TECHNIQUE: Contiguous axial images were obtained from the base of the skull through the vertex without intravenous contrast.  COMPARISON:  MRI brain 10/24/2013.  CT head 10/22/2013.  FINDINGS: Diffuse cerebral atrophy. Patchy white matter changes consistent with small vessel ischemia. No mass effect or midline shift. No abnormal extra-axial fluid collections. Gray-white matter junctions are distinct. Basal cisterns are not effaced. No evidence of acute intracranial hemorrhage. No depressed skull fractures. Visualized paranasal sinuses and mastoid air cells are not opacified.  IMPRESSION: No acute intracranial abnormalities. Chronic atrophy and small vessel ischemic changes.   Electronically Signed   By: Lucienne Capers M.D.   On: 06/15/2014 23:58    Medications:  Scheduled: . aspirin  324 mg Oral Daily  . folic acid  1 mg Oral Daily  . heparin  5,000 Units Subcutaneous 3 times per day  . levothyroxine  112 mcg Oral QAC breakfast  . LORazepam  1 mg Intravenous Once  . LORazepam  2 mg Intravenous Once  . multivitamin with minerals  1 tablet Oral Daily  . omega-3 acid ethyl esters  1 g Oral Daily  . pravastatin  10 mg Oral Daily  . vitamin C  500 mg Oral Daily    Assessment/Plan:  80y/o woman presenting with acute onset encephalopathy. Differential would include a hypertensive encephalopathy vs CVA/TIA. Blood pressures over the last 6 hours have been well controlled. As exam is overall non-focal would favor hypertensive encephalopathy as the etiology. Seizure and post-ictal state would also be a concern though she has no known seizure history or trigger and continues to remain confused for 12 hours. No signs of infectious etiology.   -MRI brain (pending) -check TSH (0.321), ammonia (42), B12 (pending) -gradual lowering of blood pressure  -hold lunesta for now -will hold on EEG for now -increase ASA to 325mg  daily   Etta Quill PA-C [facility] [phone]  06/16/2014, 9:28 AM

## 2014-06-16 NOTE — Care Management Note (Unsigned)
    Page 1 of 1   06/16/2014     3:55:04 PM CARE MANAGEMENT NOTE 06/16/2014  Patient:  Samantha Stone, Samantha Stone   Account Number:  [id number]  Date Initiated:  06/16/2014  Documentation initiated by:  GRAVES-BIGELOW,Anaalicia Reimann  Subjective/Objective Assessment:   Pt admitted for ams and headache.     Action/Plan:   CM will monitor for disposition needs.   Anticipated DC Date:  06/18/2014   Anticipated DC Plan:  [place]  CM consult      Choice offered to / List presented to:             Status of service:  In process, will continue to follow Medicare Important Message given?   (If response is "NO", the following Medicare IM given date fields will be blank) Date Medicare IM given:   Medicare IM given by:   Date Additional Medicare IM given:   Additional Medicare IM given by:    Discharge Disposition:    Per UR Regulation:  Reviewed for med. necessity/level of care/duration of stay  If discussed at [place] of Stay Meetings, dates discussed:    Comments:

## 2014-06-16 NOTE — Progress Notes (Signed)
[facility] PROGRESS NOTE  Samantha Stone VXY:[id number] DOB: 1930-08-13 DOA: 06/15/2014 PCP: Abigail Miyamoto, MD  Assessment/Plan: Acute encephalopathy Possibly secondary to hypertensive encephalopathy versus CVA. Also reports having a lot of stress with her husband at a severe Alzheimer's disease and her stepdaughter ironing lawyers to get his money. Patient tearful during conversation. Mildly suppressed TSH. Reduced Synthroid dose. Ammonia and B12 level unremarkable. UA unremarkable. Urine drug screen negative. Head CT unremarkable on admission. Patient will oriented still has some word finding difficulty.( like unable to tell what was done at her ophthalmologists' office yesterday or what kind of work her daughter does etc). -MRI brain pending. Holding off on EEG at this time. -Hold Lunesta. Aspirin was increased to 325 mg daily. -Blood pressure currently stable. -Continue neuro checks.   Hypertension Elevated BP on admission. 169/60 mmhg on admission. Currently stable. BP meds on hold  Hypothyroidism Suppressed TSH. Reduced Synthroid dose  Hyperlipidemia Continue statin  DVT prophylaxis: Subcutaneous heparin Diet: Cardiac  Code Status: full Family Communication: none at bedside Disposition Plan: home once improved   Consultants:  Neurology  Procedures:  MRI brain pending  Antibiotics:  None  HPI/Subjective: Patient appears alert and oriented but still has some word finding difficulty. Tearful during conversation as she has been  extremely stressed out lately  Objective: Filed Vitals:   06/16/14 1219  BP: 105/52  Pulse:   Temp:   Resp:     Intake/Output Summary (Last 24 hours) at 06/16/14 1413 Last data filed at 06/16/14 1300  Gross per 24 hour  Intake    480 ml  Output      0 ml  Net    480 ml   Filed Weights   06/15/14 2310 06/16/14 0204  Weight: 113.399 kg (250 lb) 112.447 kg (247 lb 14.4 oz)    Exam:   General:  Elderly obese  female in no acute distress  HEENT: No pallor, moist oral mucosa  Chest: Clear to auscultation bilaterally  CVS: Normal S1 and S2, no murmurs  Abdomen: Soft, nontender, nondistended, bowel sounds present  Next images: Warm, no edema  CNS: Alert and oriented, occasional word finding difficulty, no focal deficit  Data Reviewed: Basic Metabolic Panel:  Recent Labs Lab 06/15/14 2320  NA 137  K 4.2  CL 99  CO2 26  GLUCOSE 147*  BUN 11  CREATININE 0.80  CALCIUM 9.8   Liver Function Tests:  Recent Labs Lab 06/15/14 2320  AST 29  ALT 13  ALKPHOS 66  BILITOT 0.7  PROT 7.0  ALBUMIN 3.9   No results for input(s): LIPASE, AMYLASE in the last 168 hours.  Recent Labs Lab 06/16/14 0429  AMMONIA 42   CBC:  Recent Labs Lab 06/15/14 2320  WBC 8.4  NEUTROABS 6.2  HGB 13.2  HCT 39.6  MCV 86.7  PLT 265   Cardiac Enzymes:  Recent Labs Lab 06/15/14 2320  TROPONINI <0.30   BNP (last 3 results) No results for input(s): PROBNP in the last 8760 hours. CBG:  Recent Labs Lab 06/15/14 2354  GLUCAP 146*    No results found for this or any previous visit (from the past 240 hour(s)).   Studies: Ct Head Wo Contrast  06/15/2014   CLINICAL DATA:  Headache, confusion, and difficulty following commands for 4 hr.  EXAM: CT HEAD WITHOUT CONTRAST  TECHNIQUE: Contiguous axial images were obtained from the base of the skull through the vertex without intravenous contrast.  COMPARISON:  MRI brain 10/24/2013.  CT head 10/22/2013.  FINDINGS: Diffuse cerebral atrophy. Patchy white matter changes consistent with small vessel ischemia. No mass effect or midline shift. No abnormal extra-axial fluid collections. Gray-white matter junctions are distinct. Basal cisterns are not effaced. No evidence of acute intracranial hemorrhage. No depressed skull fractures. Visualized paranasal sinuses and mastoid air cells are not opacified.  IMPRESSION: No acute intracranial abnormalities. Chronic  atrophy and small vessel ischemic changes.   Electronically Signed   By: Lucienne Capers M.D.   On: 06/15/2014 23:58    Scheduled Meds: . aspirin  324 mg Oral Daily  . folic acid  1 mg Oral Daily  . heparin  5,000 Units Subcutaneous 3 times per day  . levothyroxine  112 mcg Oral QAC breakfast  . LORazepam  2 mg Intravenous Once  . multivitamin with minerals  1 tablet Oral Daily  . omega-3 acid ethyl esters  1 g Oral Daily  . pravastatin  10 mg Oral Daily  . vitamin C  500 mg Oral Daily   Continuous Infusions:     Time spent: 25 minutes    Saba Gomm, [place]  [facility] Pager [phone]. If 7PM-7AM, please contact night-coverage at [email or web address], password [facility] 06/16/2014, 2:13 PM  LOS: 1 day

## 2014-06-16 NOTE — Procedures (Signed)
ELECTROENCEPHALOGRAM REPORT  Patient: Samantha Stone       Room #: 9E01 EEG No. ID: [id number] Age: 80 y.o.        Sex: female Referring 80, N Report Date:  06/16/2014        Interpreting Physician: Anthony Sar  History: [name] is an 80 y.o. female with a history of TIA, hypertension and hypothyroidism, admitted for management of acute AMS.  Indications for study:  Rule out encephalopathy; rule out seizure activity.  Technique: This is an 18 channel routine scalp EEG performed at the bedside with bipolar and monopolar montages arranged in accordance to the international 10/20 system of electrode placement.   Description: This EEG recording was performed during wakefulness and during sleep. Predominant background activity during wakefulness consisted of 8 Hz symmetrical alpha rhythm which attenuated well with eye-opening. Photic stimulation produced symmetrical occipital driving response. Hyperventilation was not performed. No epileptiform discharges were recorded. During sleep was symmetrical slowing of background activity, with symmetrical sleep spindles, K complexes and vertex waves recorded during stage II of sleep.  Interpretation: This is a normal EEG recording during wakefulness and during sleep. No evidence of an epileptic disorder was seen.   Rush Farmer M.D. [facility] [phone]

## 2014-06-16 NOTE — ED Notes (Signed)
Patient with noted confusion - reports that the next holiday is thanksgiving but also reports that thanksgiving in in December. The patient does not know where she is. Also patient does not remember some events of this evening. The patient is able to follow all commands but takes prompting and has some confusion noted in the MD notes at to the changes in her mentation and performing tasks. The patient has asymmetrical facial features when smiling.

## 2014-06-16 NOTE — Progress Notes (Signed)
UR Completed Aleyna Cueva Graves-Bigelow, RN,BSN [phone]  

## 2014-06-17 DIAGNOSIS — G454 Transient global amnesia: Secondary | ICD-10-CM | POA: Diagnosis present

## 2014-06-17 DIAGNOSIS — E039 Hypothyroidism, unspecified: Secondary | ICD-10-CM

## 2014-06-17 MED ORDER — LEVOTHYROXINE SODIUM 112 MCG PO TABS: 112.0000 ug | ORAL_TABLET | Freq: Every day | ORAL | Status: DC

## 2014-06-17 NOTE — Discharge Instructions (Signed)
STROKE/TIA DISCHARGE INSTRUCTIONS SMOKING Cigarette smoking nearly doubles your risk of having a stroke & is the single most alterable risk factor  If you smoke or have smoked in the last 12 months, you are advised to quit smoking for your health.  Most of the excess cardiovascular risk related to smoking disappears within a year of stopping.  Ask you doctor about anti-smoking medications  [place] Quit Line: 1-800-QUIT NOW  Free Smoking Cessation Classes [phone]  CHOLESTEROL Know your levels; limit fat & cholesterol in your diet  Lipid Panel     Component Value Date/Time   CHOL 154 06/16/2014 0429   TRIG 59 06/16/2014 0429   HDL 55 06/16/2014 0429   CHOLHDL 2.8 06/16/2014 0429   VLDL 12 06/16/2014 0429   LDLCALC 87 06/16/2014 0429      Many patients benefit from treatment even if their cholesterol is at goal.  Goal: Total Cholesterol (CHOL) less than 160  Goal:  Triglycerides (TRIG) less than 150  Goal:  HDL greater than 40  Goal:  LDL (LDLCALC) less than 100   BLOOD PRESSURE American Stroke Association blood pressure target is less that 120/80 mm/Hg  Your discharge blood pressure is:  BP: (!) 138/96 mmHg  Monitor your blood pressure  Limit your salt and alcohol intake  Many individuals will require more than one medication for high blood pressure  DIABETES (A1c is a blood sugar average for last 3 months) Goal HGBA1c is under 7% (HBGA1c is blood sugar average for last 3 months)  Diabetes: No known diagnosis of diabetes    Lab Results  Component Value Date   HGBA1C 5.5 06/16/2014     Your HGBA1c can be lowered with medications, healthy diet, and exercise.  Check your blood sugar as directed by your physician  Call your physician if you experience unexplained or low blood sugars.  PHYSICAL ACTIVITY/REHABILITATION Goal is 30 minutes at least 4 days per week  Activity: No restrictions. Therapies: Physical Therapy: No restriction   Return to work: None    Activity decreases your risk of heart attack and stroke and makes your heart stronger.  It helps control your weight and blood pressure; helps you relax and can improve your mood.  Participate in a regular exercise program.  Talk with your doctor about the best form of exercise for you (dancing, walking, swimming, cycling).  DIET/WEIGHT Goal is to maintain a healthy weight  Your discharge diet is: Diet Heart thin  liquids Your height is:  Height: 5\' 4"  (162.6 cm) Your current weight is: Weight: 112.764 kg (248 lb 9.6 oz) Your Body Mass Index (BMI) is:  BMI (Calculated): 42.6  Following the type of diet specifically designed for you will help prevent another stroke.  Your goal weight range is:  110 - 140    Your goal Body Mass Index (BMI) is 19-24.  Healthy food habits can help reduce 3 risk factors for stroke:  High cholesterol, hypertension, and excess weight.  RESOURCES Stroke/Support Group:  Call [phone]   STROKE EDUCATION PROVIDED/REVIEWED AND GIVEN TO PATIENT Stroke warning signs and symptoms How to activate emergency medical system (call 911). Medications prescribed at discharge. Need for follow-up after discharge. Personal risk factors for stroke. Pneumonia vaccine given: No Flu vaccine given: No My questions have been answered, the writing is legible, and I understand these instructions.  I will adhere to these goals & educational materials that have been provided to me after my discharge from the hospital.

## 2014-06-17 NOTE — Plan of Care (Signed)
Problem: Progression Outcomes Goal: Progressive activity as tolerated Outcome: Completed/Met Date Met:  06/17/14

## 2014-06-17 NOTE — Plan of Care (Signed)
Problem: Progression Outcomes Goal: Communication method established Outcome: Completed/Met Date Met:  06/17/14 Goal: If vent dependent, tolerates weaning Outcome: Not Applicable Date Met:  91/50/41 Goal: Tolerating diet/TF at goal rate Outcome: Completed/Met Date Met:  06/17/14 Goal: Pain controlled Outcome: Completed/Met Date Met:  06/17/14 Goal: Bowel & Bladder Continence Outcome: Completed/Met Date Met:  06/17/14 Goal: Educational plan initiated Outcome: Completed/Met Date Met:  06/17/14

## 2014-06-17 NOTE — Discharge Summary (Signed)
Physician Discharge Summary  Samantha Stone UDJ:[id number] DOB: March 31, 1931 DOA: 06/15/2014  PCP: Abigail Miyamoto, MD  Admit date: 06/15/2014 Discharge date: 06/17/2014  Time spent: 25  minutes  Recommendations for Outpatient Follow-up:  1. D/c home with outpt PCP follow up  Discharge Diagnoses:  Principal Problem:   Acute encephalopathy   Active Problems:   Essential hypertension   Frontal headache   HLD (hyperlipidemia)   Hypothyroidism   Transient global amnesia   Discharge Condition: fair  Diet recommendation: 2gm sodium  Filed Weights   06/15/14 2310 06/16/14 0204 06/17/14 0443  Weight: 113.399 kg (250 lb) 112.447 kg (247 lb 14.4 oz) 112.764 kg (248 lb 9.6 oz)    History of present illness:  80 y.o. female with past medical history of hypothyroidism, hypertension, history of TIA, hyperlipidemia, who presented with altered mental status and headache. Patient reports that at about 4-5 5:00 PM on the day on admission, she started having severe frontal headache. She also had dizziness. She had very poor balance. she did not have vision change, hearing loss, ringing in her ears, weakness and numbness or tingling in her extremities.  Around 10 pm her son noticed that she was very disoriented, could not recognize him, did not know the date. Her mental status had  gradually improved, but not back to her baseline when she came to the ED.  Of note, she was admitted due to possible TIA in 09/2013. MRI/A at that time was unremarkable except for MCA atherosclerosis. Patient felt nauseated. She does not have fever, chills, shortness of breath, chest pain, abdominal pain, diarrhea, or rash. .Per patients son, she has been under a lot of stress recently. She had not been sleeping or eating well.  In the ED , CT head was  negative for acute abnormalities. UDS negative. Troponin negative. Urinalysis negative. Patient  admitted to inpatient for further evaluation and  treatment.  Hospital Course:  Acute encephalopathy Possibly secondary to underlying stress with lack of sleep for past few days. She also was taking lunesta to help her sleep. Also had elevated BP on presentation  stress with her husband at a severe Alzheimer's disease and her stepdaughter ironing lawyers to get his money. Patient was tearful during conversation. Mildly suppressed TSH. Reduced Synthroid dose. Ammonia and B12 level unremarkable. UA unremarkable. Urine drug screen negative. -patient back to her baseline mental status.  Head CT unremarkable on admission. -MRI brain without acute findings. ( chr small vessel disease only). EEG negative for epileptiform activity. No further recommendations per neurology.  -Hold Lunesta. Aspirin was increased to 325 mg daily. -Blood pressure currently stable. -Continue neuro checks.   Hypertension Elevated BP on admission. 169/60 mmhg on admission. Currently stable. BP meds held initially to allow permissive BP. Currently stable. Resume home BP meds  Hypothyroidism Suppressed TSH of 0.31. Reduced Synthroid dose to 112 mcg  Hyperlipidemia Continue statin    Code Status: full Family Communication: Family at bedside Disposition Plan: home   Consultants:  Neurology  Procedures:  MRI brain  EEG  Antibiotics:  None  Discharge Exam: Filed Vitals:   06/17/14 0839  BP: 138/96  Pulse: 76  Temp: 98.1 F (36.7 C)  Resp: 18     General: Elderly obese female in no acute distress  HEENT:  moist oral mucosa  Chest: Clear to auscultation bilaterally  CVS: Normal S1 and S2, no murmurs  Abdomen: Soft, nontender, nondistended, bowel sounds present  Next images: Warm, no edema  CNS: Alert and  oriented, x3, non focal  Discharge Instructions You were cared for by a hospitalist during your hospital stay. If you have any questions about your discharge medications or the care you received while you were in the hospital after  you are discharged, you can call the unit and asked to speak with the hospitalist on call if the hospitalist that took care of you is not available. Once you are discharged, your primary care physician will handle any further medical issues. Please note that NO REFILLS for any discharge medications will be authorized once you are discharged, as it is imperative that you return to your primary care physician (or establish a relationship with a primary care physician if you do not have one) for your aftercare needs so that they can reassess your need for medications and monitor your lab values.   Current Discharge Medication List    CONTINUE these medications which have CHANGED   Details  levothyroxine (SYNTHROID, LEVOTHROID) 112 MCG tablet Take 1 tablet (112 mcg total) by mouth daily. Qty: 30 tablet, Refills: 0      CONTINUE these medications which have NOT CHANGED   Details  aspirin 81 MG tablet Take 81 mg by mouth daily.    atenolol (TENORMIN) 50 MG tablet Take 50-100 mg by mouth 2 (two) times daily. 100mg  in the morning and 50mg  at night    Eszopiclone 3 MG TABS Take 3 mg by mouth at bedtime as needed (sleep).     folic acid (FOLVITE) 1 MG tablet Take 1 mg by mouth daily.    losartan (COZAAR) 100 MG tablet Take 1 tablet (100 mg total) by mouth daily. Qty: 31 tablet, Refills: 11   Associated Diagnoses: Essential hypertension    Multiple Vitamins-Minerals (CENTRUM SILVER PO) Take 1 tablet by mouth daily.    Omega-3 Fatty Acids (FISH OIL PO) Take 3 capsules by mouth daily.    pravastatin (PRAVACHOL) 10 MG tablet Take 10 mg by mouth daily.    vitamin C (ASCORBIC ACID) 500 MG tablet Take 500 mg by mouth daily.       Allergies  Allergen Reactions  . Phenergan [Promethazine Hcl]     hallucinations   Follow-up Information    Follow up with FRIED, ROBERT L, MD. Schedule an appointment as soon as possible for a visit in 1 week.   Specialty:  Family Medicine   Contact information:    [place]  [phone]        The results of significant diagnostics from this hospitalization (including imaging, microbiology, ancillary and laboratory) are listed below for reference.    Significant Diagnostic Studies: Ct Head Wo Contrast  06/15/2014   CLINICAL DATA:  Headache, confusion, and difficulty following commands for 4 hr.  EXAM: CT HEAD WITHOUT CONTRAST  TECHNIQUE: Contiguous axial images were obtained from the base of the skull through the vertex without intravenous contrast.  COMPARISON:  MRI brain 10/24/2013.  CT head 10/22/2013.  FINDINGS: Diffuse cerebral atrophy. Patchy white matter changes consistent with small vessel ischemia. No mass effect or midline shift. No abnormal extra-axial fluid collections. Gray-white matter junctions are distinct. Basal cisterns are not effaced. No evidence of acute intracranial hemorrhage. No depressed skull fractures. Visualized paranasal sinuses and mastoid air cells are not opacified.  IMPRESSION: No acute intracranial abnormalities. Chronic atrophy and small vessel ischemic changes.   Electronically Signed   By: Lucienne Capers M.D.   On: 06/15/2014 23:58   Mr Brain Wo Contrast  06/16/2014   CLINICAL DATA:  Altered mental status. TIA and headache. Headache began 24 hr ago. Dizziness. Balance disturbance. Disorientation.  EXAM: MRI HEAD WITHOUT CONTRAST  MRA HEAD WITHOUT CONTRAST  TECHNIQUE: Multiplanar, multiecho pulse sequences of the brain and surrounding structures were obtained without intravenous contrast. Angiographic images of the head were obtained using MRA technique without contrast.  COMPARISON:  Head CT 06/15/2014.  MRI 10/24/2013.  FINDINGS: MRI HEAD FINDINGS  Diffusion imaging does not show any acute or subacute infarction. There chronic small-vessel ischemic changes affecting the pons. There are a few old small vessel cerebellar insults. The cerebral hemispheres show moderate chronic small-vessel  ischemic changes affecting the white matter. No cortical or large vessel territory infarction. No mass lesion, hemorrhage, hydrocephalus or extra-axial collection. No pituitary mass. Sinuses are clear except for some fluid in the sphenoid sinus.  MRA HEAD FINDINGS  There is moderate motion degradation. Both internal carotid arteries are widely patent into the brain. No siphon stenosis. The anterior and middle cerebral vessels are patent. There appears to be a moderate stenosis at the left MCA bifurcation. This appears similar to the previous study. Both vertebral arteries are widely patent to the basilar. No basilar stenosis. Posterior circulation branch vessels are patent.  IMPRESSION: No acute infarction. Moderate chronic small-vessel ischemic changes affecting the white matter.  No major vessel occlusion. 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   Electronically Signed   By: Nelson Chimes M.D.   On: 06/16/2014 20:07   Mr Jodene Nam Head/brain Wo Cm  06/16/2014   CLINICAL DATA:  Altered mental status. TIA and headache. Headache began 24 hr ago. Dizziness. Balance disturbance. Disorientation.  EXAM: MRI HEAD WITHOUT CONTRAST  MRA HEAD WITHOUT CONTRAST  TECHNIQUE: Multiplanar, multiecho pulse sequences of the brain and surrounding structures were obtained without intravenous contrast. Angiographic images of the head were obtained using MRA technique without contrast.  COMPARISON:  Head CT 06/15/2014.  MRI 10/24/2013.  FINDINGS: MRI HEAD FINDINGS  Diffusion imaging does not show any acute or subacute infarction. There chronic small-vessel ischemic changes affecting the pons. There are a few old small vessel cerebellar insults. The cerebral hemispheres show moderate chronic small-vessel ischemic changes affecting the white matter. No cortical or large vessel territory infarction. No mass lesion, hemorrhage, hydrocephalus or extra-axial collection. No pituitary mass. Sinuses  are clear except for some fluid in the sphenoid sinus.  MRA HEAD FINDINGS  There is moderate motion degradation. Both internal carotid arteries are widely patent into the brain. No siphon stenosis. The anterior and middle cerebral vessels are patent. There appears to be a moderate stenosis at the left MCA bifurcation. This appears similar to the previous study. Both vertebral arteries are widely patent to the basilar. No basilar stenosis. Posterior circulation branch vessels are patent.  IMPRESSION: No acute infarction. Moderate chronic small-vessel ischemic changes affecting the white matter.  No major vessel occlusion. 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   Electronically Signed   By: Nelson Chimes M.D.   On: 06/16/2014 20:07    Microbiology: No results found for this or any previous visit (from the past 240 hour(s)).   Labs: Basic Metabolic Panel:  Recent Labs Lab 06/15/14 2320  NA 137  K 4.2  CL 99  CO2 26  GLUCOSE 147*  BUN 11  CREATININE 0.80  CALCIUM 9.8   Liver Function Tests:  Recent Labs Lab 06/15/14 2320  AST 29  ALT 13  ALKPHOS 66  BILITOT 0.7  PROT 7.0  ALBUMIN 3.9   No results for input(s): LIPASE, AMYLASE in the last 168 hours.  Recent Labs Lab 06/16/14 0429  AMMONIA 42   CBC:  Recent Labs Lab 06/15/14 2320  WBC 8.4  NEUTROABS 6.2  HGB 13.2  HCT 39.6  MCV 86.7  PLT 265   Cardiac Enzymes:  Recent Labs Lab 06/15/14 2320  TROPONINI <0.30   BNP: BNP (last 3 results) No results for input(s): PROBNP in the last 8760 hours. CBG:  Recent Labs Lab 06/15/14 2354  GLUCAP 146*       Signed:  Kendrick Haapala  [facility] 06/17/2014, 10:32 AM

## 2014-06-17 NOTE — Progress Notes (Signed)
OT Cancellation Note and Discharge  Patient Details Name: Samantha Stone MRN: [id number] DOB: 28-Dec-1930   Cancelled Treatment:     Pt is in process of D/C'ing home with nursing going over D/C instructions. SLP has already seen her and has made recommendations. PT has seen her but note not in yet, however per nursing they have OK'd that she is safe to D/C home. Acute OT will not eval, we will sign off.  Almon Register [phone] 06/17/2014, 11:22 AM

## 2014-06-17 NOTE — Plan of Care (Signed)
Problem: Acute Treatment Outcomes Goal: tPA Patient w/o S&S of bleeding Outcome: Completed/Met Date Met:  06/17/14 Goal: Other Acute Treatment Outcomes Outcome: Completed/Met Date Met:  06/17/14

## 2014-06-17 NOTE — Plan of Care (Signed)
Problem: Progression Outcomes Goal: Initial discharge plan initiated Outcome: Completed/Met Date Met:  06/17/14

## 2014-06-17 NOTE — Evaluation (Addendum)
Speech Language Pathology Evaluation Patient Details Name: Samantha Stone MRN: [id number] DOB: [birth year] Today's Date: 06/17/2014 Time: 4665-9935 SLP Time Calculation (min) (ACUTE ONLY): 26 min  Problem List:  Patient Active Problem List   Diagnosis Date Noted  . 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 Confusion 06/16/2014  . Acute encephalopathy 06/16/2014  . HLD (hyperlipidemia) 06/16/2014  . Hypothyroidism 06/16/2014  . Frontal headache   . Aphasia 10/23/2013  . Essential hypertension 10/23/2013   Past Medical History:  Past Medical History  Diagnosis Date  . Hypertension   . Hypothyroidism   . Arthritis   . Concussion    Past Surgical History:  Past Surgical History  Procedure Laterality Date  . I&d extremity  03/11/2012    Procedure: MINOR IRRIGATION AND DEBRIDEMENT EXTREMITY;  Surgeon: Cammie Sickle., MD;  Location: [place] [facility];  Service: Orthopedics;  Laterality: Right;  Right little finger  . Tonsillectomy    . Appendectomy    . Abdominal hysterectomy    . Trigger finger release    . Knee arthroscopy      bilat   . Replacement total knee bilateral    . Carpal tunnel release      rt x2  . Tubal ligation    . Colonoscopy     HPI:  80 y.o. female with past medical history of hypothyroidism, hypertension, history of TIA, hyperlipidemia, who presented with altered mental status and headache.  Pt. was admitted due to possible TIA in 09/2013 with unremarkable MRI/A .  Per MD report, patients son states she has been under a lot of stress recently.  MRI No acute infarction. Moderate chronic small-vessel ischemic changes affecting the white matter. No major vessel occlusion. 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   Assessment / Plan / Recommendation Clinical Impression  Pt. demonstrated signs of mild anxiety due to stressful life events at present.  She is labile when speaking of her spouse, however  pt. and son report this is baseline.  Verbal expression is overall functional with intermittent hesitations and dysnomia and excessive verbosity, tangential and decreased topic maintenance that SLP suspects if baseline   Min-mild cognitive difficulties with alternating and divided attention and memory.  SLP educated pt. and son re: use of pill box and written information to recall information as well as for prospective memory.  Verbal problem solving WFL's. She reports using a pill box currently.  SLP recommends supervision for 5-7 days and family to briefly check behind her the next time she handles finances and fills pill box.  No additional therapy needed.     SLP Assessment  Patient does not need any further Speech Lanaguage Pathology Services    Follow Up Recommendations  None    Frequency and Duration        Pertinent Vitals/Pain Pain Assessment: No/denies pain       SLP Evaluation Prior Functioning  Cognitive/Linguistic Baseline: Within functional limits Type of Home: House  Lives With: Alone Available Help at Discharge: Family   Cognition  Overall Cognitive Status: Impaired/Different from baseline Arousal/Alertness: Awake/alert Orientation Level: Oriented X4 Attention: Alternating Alternating Attention: Impaired Alternating Attention Impairment: Verbal basic Memory: Impaired Memory Impairment: Decreased recall of new information Awareness: Appears intact Problem Solving: Appears intact (for verbal) Behaviors: Lability Safety/Judgment:  (mostly intact, rec supervision for a week)    Comprehension  Auditory Comprehension Overall Auditory Comprehension: Appears within functional limits for tasks assessed Visual Recognition/Discrimination Discrimination: Not  tested Reading Comprehension Reading Status: Within funtional limits    Expression Expression Primary Mode of Expression: Verbal Verbal Expression Overall Verbal Expression: Appears within functional limits for  tasks assessed Pragmatics: Impairment Impairments: Turn Taking;Topic maintenance Written Expression Dominant Hand: Left Written Expression: Within Functional Limits   Oral / Motor Oral Motor/Sensory Function Overall Oral Motor/Sensory Function: Appears within functional limits for tasks assessed Motor Speech Overall Motor Speech: Appears within functional limits for tasks assessed Motor Planning: Witnin functional limits   GO Functional Assessment Tool Used: skilled clinical judgement Functional Limitations: Memory Memory Current Status (Y5035): At least 20 percent but less than 40 percent impaired, limited or restricted Memory Goal Status (W6568): At least 20 percent but less than 40 percent impaired, limited or restricted Memory Discharge Status [phone]): At least 20 percent but less than 40 percent impaired, limited or restricted   Houston Siren 06/17/2014, 10:36 AM   Samantha Stone.Ed [employer] [phone]

## 2014-06-17 NOTE — Plan of Care (Signed)
Problem: Progression Outcomes Goal: Other Progression Outcomes Outcome: Completed/Met Date Met:  06/17/14

## 2014-06-21 ENCOUNTER — Other Ambulatory Visit: Payer: Medicare Other

## 2014-06-23 ENCOUNTER — Other Ambulatory Visit: Payer: Medicare Other

## 2014-06-28 ENCOUNTER — Other Ambulatory Visit (INDEPENDENT_AMBULATORY_CARE_PROVIDER_SITE_OTHER): Payer: Medicare Other | Admitting: *Deleted

## 2014-06-28 DIAGNOSIS — I10 Essential (primary) hypertension: Secondary | ICD-10-CM

## 2014-06-28 LAB — BASIC METABOLIC PANEL
BUN: 14 mg/dL (ref 6–23)
CO2: 26 mEq/L (ref 19–32)
Calcium: 9.4 mg/dL (ref 8.4–10.5)
Chloride: 99 mEq/L (ref 96–112)
Creatinine, Ser: 0.9 mg/dL (ref 0.4–1.2)
GFR: 63.01 mL/min (ref >60.00)
Glucose, Bld: 146 mg/dL — ABNORMAL HIGH (ref 70–99)
Potassium: 4.3 mEq/L (ref 3.5–5.1)
Sodium: 134 mEq/L — ABNORMAL LOW (ref 135–145)

## 2014-09-17 ENCOUNTER — Ambulatory Visit (INDEPENDENT_AMBULATORY_CARE_PROVIDER_SITE_OTHER): Payer: Medicare Other | Admitting: Cardiovascular Disease

## 2014-09-17 ENCOUNTER — Encounter: Payer: Self-pay | Admitting: Cardiovascular Disease

## 2014-09-17 VITALS — BP 130/70 | HR 56 | Ht 64.0 in | Wt 256.8 lb

## 2014-09-17 DIAGNOSIS — E785 Hyperlipidemia, unspecified: Secondary | ICD-10-CM

## 2014-09-17 NOTE — Patient Instructions (Addendum)

## 2014-09-17 NOTE — Progress Notes (Signed)
Cardiology Office Note   Date:  09/17/2014   ID:  Samantha Stone, DOB 05-11-1931, MRN [id number]  PCP:  Abigail Miyamoto, MD  Cardiologist:   Thayer Headings, MD   Chief Complaint  Patient presents with  . Hypertension   1. Hypertension 2. Possible TIAs - mental status changes , difficulty talking.  3. Hypothyroidism 4. Hyperlipidemia 5.   History of Present Illness:  Samantha Stone is an 81 yo who I have known for many years. 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   Samantha Stone has fallen several times over the past several months. She slipped on the wet floor on the ceramic tile. She presents for further management of her HTN. Her BP has been irregular at home. Her diet is fairly consistent. She does not eat much salt. She has frozen lasagna on occasion. Hot dog once a month or so.   She was admitted to [facility] in March, 2015 for mental confusion and HTN Echo showed:  Left ventricle: The cavity size was normal. Wall thickness was at the upper limits of normal. Systolic function was normal. The estimated ejection fraction was in the range of 60% to 65%. Wall motion was normal; there were no regional wall motion abnormalities. Doppler parameters are consistent with abnormal left ventricular relaxation (grade 1 diastolic dysfunction). - Mitral valve: Mild regurgitation. - Left atrium: The atrium was moderately to severely dilated. - Pulmonary arteries: Systolic pressure was mildly increased. PA peak pressure: 43mm Hg    Oct. 20, 2015:  Samantha Stone is doing well. She had her cataracts done.  No CP or dyspnea.    Feb. 19, 2016:   Samantha Stone is a 81 y.o. female who presents for her HTN and hyperlipidemia. Family issues,  - Marden Noble was placed in a nursing home.  Step daughter challenged.  Has had a rough 3-4 months.   Has not been exerciseing, diet has not been as strict recently.    Past Medical History  Diagnosis Date  . Hypertension   . Hypothyroidism     . Arthritis   . Concussion     Past Surgical History  Procedure Laterality Date  . I&d extremity  03/11/2012    Procedure: MINOR IRRIGATION AND DEBRIDEMENT EXTREMITY;  Surgeon: Cammie Sickle., MD;  Location: [place] [facility];  Service: Orthopedics;  Laterality: Right;  Right little finger  . Tonsillectomy    . Appendectomy    . Abdominal hysterectomy    . Trigger finger release    . Knee arthroscopy      bilat   . Replacement total knee bilateral    . Carpal tunnel release      rt x2  . Tubal ligation    . Colonoscopy       Current Outpatient Prescriptions  Medication Sig Dispense Refill  . aspirin 81 MG tablet Take 81 mg by mouth daily.    Marland Kitchen atenolol (TENORMIN) 50 MG tablet Take 50-100 mg by mouth 2 (two) times daily. 100mg  in the morning and 50mg  at night    . citalopram (CELEXA) 10 MG tablet Take 10 mg by mouth daily.  1  . folic acid (FOLVITE) 1 MG tablet Take 1 mg by mouth daily.    Marland Kitchen levothyroxine (SYNTHROID, LEVOTHROID) 112 MCG tablet Take 1 tablet (112 mcg total) by mouth daily. 30 tablet 0  . losartan (COZAAR) 100 MG tablet Take 1 tablet (100 mg total) by mouth daily. 31 tablet 11  . Multiple Vitamins-Minerals (  CENTRUM SILVER PO) Take 1 tablet by mouth daily.    . Omega-3 Fatty Acids (FISH OIL PO) Take 3 capsules by mouth daily.    . pravastatin (PRAVACHOL) 10 MG tablet Take 10 mg by mouth daily.    . Suvorexant 20 MG TABS Take 20 mg by mouth at bedtime.    . traZODone (DESYREL) 50 MG tablet Take 50 mg by mouth at bedtime.    . vitamin C (ASCORBIC ACID) 500 MG tablet Take 500 mg by mouth daily.     No current facility-administered medications for this visit.    Allergies:   Phenergan    Social History:  The patient  reports that she has never smoked. She does not have any smokeless tobacco history on file. She reports that she drinks alcohol. She reports that she does not use illicit drugs.   Family History:  The patient's family history includes  Bladder Cancer in her father.    ROS:  Please see the history of present illness.    Review of Systems: Constitutional:  denies fever, chills, diaphoresis, appetite change and fatigue.  HEENT: denies photophobia, eye pain, redness, hearing loss, ear pain, congestion, sore throat, rhinorrhea, sneezing, neck pain, neck stiffness and tinnitus.  Respiratory: denies SOB, DOE, cough, chest tightness, and wheezing.  Cardiovascular: denies chest pain, palpitations and leg swelling.  Gastrointestinal: denies nausea, vomiting, abdominal pain, diarrhea, constipation, blood in stool.  Genitourinary: denies dysuria, urgency, frequency, hematuria, flank pain and difficulty urinating.  Musculoskeletal: denies  myalgias, back pain, joint swelling, arthralgias and gait problem.   Skin: denies pallor, rash and wound.  Neurological: denies dizziness, seizures, syncope, weakness, light-headedness, numbness and headaches.   Hematological: denies adenopathy, easy bruising, personal or family bleeding history.  Psychiatric/ Behavioral: denies suicidal ideation, mood changes, confusion, nervousness, sleep disturbance and agitation.       All other systems are reviewed and negative.    PHYSICAL EXAM: VS:  BP 154/76 mmHg  Pulse 56  Ht 5\' 4"  (1.626 m)  Wt 256 lb 12.8 oz (116.484 kg)  BMI 44.06 kg/m2  SpO2 97% , BMI Body mass index is 44.06 kg/(m^2). GEN: Well nourished, well developed, in no acute distress HEENT: normal Neck: no JVD, carotid bruits, or masses Cardiac: RRR; no murmurs, rubs, or gallops,no edema  Respiratory:  clear to auscultation bilaterally, normal work of breathing GI: soft, nontender, nondistended, + BS MS: no deformity or atrophy Skin: warm and dry, no rash Neuro:  Strength and sensation are intact Psych: normal   EKG:  EKG is not ordered today.    Recent Labs: 06/15/2014: ALT 13; Hemoglobin 13.2; Platelets 265 06/16/2014: TSH 0.321* 06/28/2014: BUN 14; Creatinine 0.9;  Potassium 4.3; Sodium 134*    Lipid Panel    Component Value Date/Time   CHOL 154 06/16/2014 0429   TRIG 59 06/16/2014 0429   HDL 55 06/16/2014 0429   CHOLHDL 2.8 06/16/2014 0429   VLDL 12 06/16/2014 0429   LDLCALC 87 06/16/2014 0429      Wt Readings from Last 3 Encounters:  09/17/14 256 lb 12.8 oz (116.484 kg)  06/17/14 248 lb 9.6 oz (112.764 kg)  05/18/14 256 lb 12.8 oz (116.484 kg)      Other studies Reviewed: Additional studies/ records that were reviewed today include: . Review of the above records demonstrates:    ASSESSMENT AND PLAN:  1. Hypertension - BP is doing well.  She needs to get back into her exercise and pay more attention to her diet.  2. Possible TIAs - mental status changes , difficulty talking.   3. Hypothyroidism  4. Hyperlipidemia - will check lipids in 6 months .    Current medicines are reviewed at length with the patient today.  The patient does not have concerns regarding medicines.  The following changes have been made:  no change   Disposition:   FU with me in 6 months     Signed, Nahser, Wonda Cheng, MD  09/17/2014 10:54 AM    [facility] [address], [place], [place]  [address] Phone: [phone]; Fax: [phone]

## 2015-02-18 ENCOUNTER — Other Ambulatory Visit (HOSPITAL_COMMUNITY): Payer: Self-pay | Admitting: Orthopedic Surgery

## 2015-02-18 DIAGNOSIS — T8484XD Pain due to internal orthopedic prosthetic devices, implants and grafts, subsequent encounter: Secondary | ICD-10-CM

## 2015-02-18 DIAGNOSIS — Z96649 Presence of unspecified artificial hip joint: Principal | ICD-10-CM

## 2015-02-23 ENCOUNTER — Encounter (HOSPITAL_COMMUNITY)
Admission: RE | Admit: 2015-02-23 | Discharge: 2015-02-23 | Disposition: A | Discharge status: Home / Self Care | Payer: Medicare Other | Source: Ambulatory Visit | Attending: Orthopedic Surgery | Admitting: Orthopedic Surgery

## 2015-02-23 DIAGNOSIS — Z96649 Presence of unspecified artificial hip joint: Secondary | ICD-10-CM

## 2015-02-23 DIAGNOSIS — X58XXXA Exposure to other specified factors, initial encounter: Secondary | ICD-10-CM | POA: Insufficient documentation

## 2015-02-23 DIAGNOSIS — T8484XD Pain due to internal orthopedic prosthetic devices, implants and grafts, subsequent encounter: Secondary | ICD-10-CM | POA: Insufficient documentation

## 2015-02-23 MED ORDER — TECHNETIUM TC 99M MEDRONATE IV KIT
25.0000 | PACK | Freq: Once | INTRAVENOUS | Status: AC | PRN
  Administered 2015-02-23: 25 via INTRAVENOUS

## 2015-03-07 ENCOUNTER — Other Ambulatory Visit: Payer: Self-pay | Admitting: Orthopedic Surgery

## 2015-03-08 ENCOUNTER — Encounter (HOSPITAL_COMMUNITY)
Admission: RE | Admit: 2015-03-08 | Discharge: 2015-03-08 | Disposition: A | Discharge status: Home / Self Care | Payer: Medicare Other | Source: Ambulatory Visit | Attending: Orthopedic Surgery | Admitting: Orthopedic Surgery

## 2015-03-08 ENCOUNTER — Ambulatory Visit (HOSPITAL_COMMUNITY)
Admission: RE | Admit: 2015-03-08 | Discharge: 2015-03-08 | Disposition: A | Discharge status: Home / Self Care | Payer: Medicare Other | Source: Ambulatory Visit | Attending: Orthopedic Surgery | Admitting: Orthopedic Surgery

## 2015-03-08 ENCOUNTER — Encounter (HOSPITAL_COMMUNITY): Payer: Self-pay

## 2015-03-08 DIAGNOSIS — Z01818 Encounter for other preprocedural examination: Secondary | ICD-10-CM | POA: Insufficient documentation

## 2015-03-08 DIAGNOSIS — Z01812 Encounter for preprocedural laboratory examination: Secondary | ICD-10-CM | POA: Diagnosis not present

## 2015-03-08 DIAGNOSIS — I517 Cardiomegaly: Secondary | ICD-10-CM | POA: Diagnosis not present

## 2015-03-08 HISTORY — DX: Malignant (primary) neoplasm, unspecified: C80.1

## 2015-03-08 HISTORY — DX: Stress incontinence (female) (male): N39.3

## 2015-03-08 HISTORY — DX: Headache: R51

## 2015-03-08 HISTORY — DX: Major depressive disorder, single episode, unspecified: F32.9

## 2015-03-08 LAB — URINALYSIS, ROUTINE W REFLEX MICROSCOPIC
Bilirubin Urine: NEGATIVE
Glucose, UA: NEGATIVE mg/dL
Hgb urine dipstick: NEGATIVE
Ketones, ur: NEGATIVE mg/dL
Nitrite: NEGATIVE
Protein, ur: NEGATIVE mg/dL
Specific Gravity, Urine: 1.018 (ref 1.005–1.030)
Urobilinogen, UA: 1 mg/dL (ref 0.0–1.0)
pH: 7.5 (ref 5.0–8.0)

## 2015-03-08 LAB — COMPREHENSIVE METABOLIC PANEL
ALT: 20 U/L (ref 14–54)
AST: 29 U/L (ref 15–41)
Albumin: 3.7 g/dL (ref 3.5–5.0)
Alkaline Phosphatase: 66 U/L (ref 38–126)
Anion gap: 10 (ref 5–15)
BUN: 9 mg/dL (ref 6–20)
CO2: 26 mmol/L (ref 22–32)
Calcium: 9.5 mg/dL (ref 8.9–10.3)
Chloride: 99 mmol/L — ABNORMAL LOW (ref 101–111)
Creatinine, Ser: 1.04 mg/dL — ABNORMAL HIGH (ref 0.44–1.00)
GFR calc Af Amer: 56 mL/min — ABNORMAL LOW (ref >60)
GFR calc non Af Amer: 49 mL/min — ABNORMAL LOW (ref >60)
Glucose, Bld: 107 mg/dL — ABNORMAL HIGH (ref 65–99)
Potassium: 4.2 mmol/L (ref 3.5–5.1)
Sodium: 135 mmol/L (ref 135–145)
Total Bilirubin: 0.9 mg/dL (ref 0.3–1.2)
Total Protein: 6.7 g/dL (ref 6.5–8.1)

## 2015-03-08 LAB — CBC WITH DIFFERENTIAL/PLATELET
Basophils Absolute: 0 10*3/uL (ref 0.0–0.1)
Basophils Relative: 1 % (ref 0–1)
Eosinophils Absolute: 0.3 10*3/uL (ref 0.0–0.7)
Eosinophils Relative: 4 % (ref 0–5)
HCT: 42.1 % (ref 36.0–46.0)
Hemoglobin: 14.1 g/dL (ref 12.0–15.0)
Lymphocytes Relative: 28 % (ref 12–46)
Lymphs Abs: 1.8 10*3/uL (ref 0.7–4.0)
MCH: 29.6 pg (ref 26.0–34.0)
MCHC: 33.5 g/dL (ref 30.0–36.0)
MCV: 88.4 fL (ref 78.0–100.0)
Monocytes Absolute: 1.2 10*3/uL — ABNORMAL HIGH (ref 0.1–1.0)
Monocytes Relative: 19 % — ABNORMAL HIGH (ref 3–12)
Neutro Abs: 3.1 10*3/uL (ref 1.7–7.7)
Neutrophils Relative %: 48 % (ref 43–77)
Platelets: 273 10*3/uL (ref 150–400)
RBC: 4.76 MIL/uL (ref 3.87–5.11)
RDW: 13.9 % (ref 11.5–15.5)
WBC: 6.3 10*3/uL (ref 4.0–10.5)

## 2015-03-08 LAB — PROTIME-INR
INR: 1.15 (ref 0.00–1.49)
Prothrombin Time: 14.9 seconds (ref 11.6–15.2)

## 2015-03-08 LAB — URINE MICROSCOPIC-ADD ON

## 2015-03-08 LAB — TYPE AND SCREEN
ABO/RH(D): O POS
Antibody Screen: NEGATIVE

## 2015-03-08 LAB — ABO/RH: ABO/RH(D): O POS

## 2015-03-08 LAB — SURGICAL PCR SCREEN
MRSA, PCR: NEGATIVE
Staphylococcus aureus: NEGATIVE

## 2015-03-08 LAB — APTT: aPTT: 35 seconds (ref 24–37)

## 2015-03-08 NOTE — Progress Notes (Signed)
Pt denies any cardiac history, chest pain or sob. Pt does see Dr. Acie Fredrickson for HTN and hyperlipidemia. Last Echo was 10/24/13 and was done during a work up for possible CVA. No indication of stroke was found in any testing done. Headaches that she was having was determined to be stress related.

## 2015-03-08 NOTE — Progress Notes (Signed)
Called Dr. Berenice Primas' office and left message on Jonette Pesa voicemail requesting pre-op orders be signed. Pt has a 10 AM PAT appointment today.

## 2015-03-08 NOTE — Pre-Procedure Instructions (Signed)
Samantha Stone  03/08/2015      Your procedure is scheduled on Monday, March 14, 2015 at 3:00 PM.   Report to [facility] Entrance "A" Admitting Office at 1:00 PM.   Call this number if you have problems the morning of surgery: [phone]   Any questions prior to day of surgery, please call [phone] between 8 & 4 PM.   Remember:  Do not eat food or drink liquids after midnight Sunday, 03/13/15.  Take these medicines the morning of surgery with A SIP OF WATER: Atenolol (Tenormin), Citalopram (Celexa), Levothyroxine (Synthroid), Oxycodone - if needed  Stop Aspirin, Fish Oil and NSAIDS (Naproxen, Ibuprofen, Aleve, etc.) as of today.    Do not wear jewelry, make-up or nail polish.  Do not wear lotions, powders, or perfumes.  You may wear deodorant.  Do not shave 48 hours prior to surgery.    Do not bring valuables to the hospital.  [facility] is not responsible for any belongings or valuables.  Contacts, dentures or bridgework may not be worn into surgery.  Leave your suitcase in the car.  After surgery it may be brought to your room.  For patients admitted to the hospital, discharge time will be determined by your treatment team.  Special instructions:  [facility] - Preparing for Surgery  Before surgery, you can play an important role.  Because skin is not sterile, your skin needs to be as free of germs as possible.  You can reduce the number of germs on you skin by washing with CHG (chlorahexidine gluconate) soap before surgery.  CHG is an antiseptic cleaner which kills germs and bonds with the skin to continue killing germs even after washing.  Please DO NOT use if you have an allergy to CHG or antibacterial soaps.  If your skin becomes reddened/irritated stop using the CHG and inform your nurse when you arrive at Short Stay.  Do not shave (including legs and underarms) for at least 48 hours prior to the first CHG shower.  You may shave your face.  Please  follow these instructions carefully:   1.  Shower with CHG Soap the night before surgery and the                                morning of Surgery.  2.  If you choose to wash your hair, wash your hair first as usual with your       normal shampoo.  3.  After you shampoo, rinse your hair and body thoroughly to remove the                      Shampoo.  4.  Use CHG as you would any other liquid soap.  You can apply chg directly       to the skin and wash gently with scrungie or a clean washcloth.  5.  Apply the CHG Soap to your body ONLY FROM THE NECK DOWN.        Do not use on open wounds or open sores.  Avoid contact with your eyes, ears, mouth and genitals (private parts).  Wash genitals (private parts) with your normal soap.  6.  Wash thoroughly, paying special attention to the area where your surgery        will be performed.  7.  Thoroughly rinse your body with warm water from the neck down.  8.  DO NOT shower/wash with your normal soap after using and rinsing off       the CHG Soap.  9.  Pat yourself dry with a clean towel.            10.  Wear clean pajamas.            11.  Place clean sheets on your bed the night of your first shower and do not        sleep with pets.  Day of Surgery  Do not apply any lotions the morning of surgery.  Please wear clean clothes to the hospital.   Please read over the following fact sheets that you were given. Pain Booklet, Coughing and Deep Breathing, Blood Transfusion Information, MRSA Information and Surgical Site Infection Prevention

## 2015-03-08 NOTE — Pre-Procedure Instructions (Signed)
RANELL FINELLI  03/08/2015      Your procedure is scheduled on Monday, March 14, 2015 at 3:00 PM.   Report to [facility] Entrance "A" Admitting Office at 1:00 PM.   Call this number if you have problems the morning of surgery: [phone]   Any questions prior to day of surgery, please call [phone] between 8 & 4 PM.   Remember:  Do not eat food or drink liquids after midnight Sunday, 03/13/15.  Take these medicines the morning of surgery with A SIP OF WATER: Atenolol (Tenormin), Citalopram (Celexa), Levothyroxine (Synthroid), Oxycodone - if needed  Stop Aspirin, Fish Oil and NSAIDS (Naproxen, Ibuprofen, Aleve, etc.)   Do not wear jewelry, make-up or nail polish.  Do not wear lotions, powders, or perfumes.  You may wear deodorant.  Do not shave 48 hours prior to surgery.    Do not bring valuables to the hospital.  [facility] is not responsible for any belongings or valuables.  Contacts, dentures or bridgework may not be worn into surgery.  Leave your suitcase in the car.  After surgery it may be brought to your room.  For patients admitted to the hospital, discharge time will be determined by your treatment team.  Special instructions:  [facility] - Preparing for Surgery  Before surgery, you can play an important role.  Because skin is not sterile, your skin needs to be as free of germs as possible.  You can reduce the number of germs on you skin by washing with CHG (chlorahexidine gluconate) soap before surgery.  CHG is an antiseptic cleaner which kills germs and bonds with the skin to continue killing germs even after washing.  Please DO NOT use if you have an allergy to CHG or antibacterial soaps.  If your skin becomes reddened/irritated stop using the CHG and inform your nurse when you arrive at Short Stay.  Do not shave (including legs and underarms) for at least 48 hours prior to the first CHG shower.  You may shave your face.  Please follow these  instructions carefully:   1.  Shower with CHG Soap the night before surgery and the                                morning of Surgery.  2.  If you choose to wash your hair, wash your hair first as usual with your       normal shampoo.  3.  After you shampoo, rinse your hair and body thoroughly to remove the                      Shampoo.  4.  Use CHG as you would any other liquid soap.  You can apply chg directly       to the skin and wash gently with scrungie or a clean washcloth.  5.  Apply the CHG Soap to your body ONLY FROM THE NECK DOWN.        Do not use on open wounds or open sores.  Avoid contact with your eyes, ears, mouth and genitals (private parts).  Wash genitals (private parts) with your normal soap.  6.  Wash thoroughly, paying special attention to the area where your surgery        will be performed.  7.  Thoroughly rinse your body with warm water from the neck down.  8.  DO  NOT shower/wash with your normal soap after using and rinsing off       the CHG Soap.  9.  Pat yourself dry with a clean towel.            10.  Wear clean pajamas.            11.  Place clean sheets on your bed the night of your first shower and do not        sleep with pets.  Day of Surgery  Do not apply any lotions the morning of surgery.  Please wear clean clothes to the hospital.   Please read over the following fact sheets that you were given. Pain Booklet, Coughing and Deep Breathing, Blood Transfusion Information, MRSA Information and Surgical Site Infection Prevention

## 2015-03-11 MED ORDER — CHLORHEXIDINE GLUCONATE 4 % EX LIQD: 60.0000 mL | CUTANEOUS | Status: DC

## 2015-03-11 MED ORDER — CEFAZOLIN SODIUM-DEXTROSE 2-3 GM-% IV SOLR
2.0000 g | INTRAVENOUS | Status: AC
  Administered 2015-03-14: 2 g via INTRAVENOUS
  Filled 2015-03-11: qty 50

## 2015-03-11 NOTE — Progress Notes (Signed)
Patient called with time change.inst to arrive at 1200 pm. Ok with patient

## 2015-03-14 ENCOUNTER — Inpatient Hospital Stay (HOSPITAL_COMMUNITY): Payer: Medicare Other | Admitting: Anesthesiology

## 2015-03-14 ENCOUNTER — Inpatient Hospital Stay (HOSPITAL_COMMUNITY)
Admission: RE | Admit: 2015-03-14 | Discharge: 2015-03-17 | DRG: 467 | Disposition: A | Discharge status: Skilled Nursing Facility | Payer: Medicare Other | Source: Ambulatory Visit | Attending: Orthopedic Surgery | Admitting: Orthopedic Surgery

## 2015-03-14 ENCOUNTER — Encounter (HOSPITAL_COMMUNITY): Payer: Self-pay | Admitting: Anesthesiology

## 2015-03-14 ENCOUNTER — Encounter (HOSPITAL_COMMUNITY)
Admission: RE | Disposition: A | Discharge status: Skilled Nursing Facility | Payer: Self-pay | Source: Ambulatory Visit | Attending: Orthopedic Surgery

## 2015-03-14 DIAGNOSIS — Z85828 Personal history of other malignant neoplasm of skin: Secondary | ICD-10-CM | POA: Diagnosis not present

## 2015-03-14 DIAGNOSIS — I10 Essential (primary) hypertension: Secondary | ICD-10-CM | POA: Diagnosis present

## 2015-03-14 DIAGNOSIS — Z79891 Long term (current) use of opiate analgesic: Secondary | ICD-10-CM

## 2015-03-14 DIAGNOSIS — E039 Hypothyroidism, unspecified: Secondary | ICD-10-CM | POA: Diagnosis present

## 2015-03-14 DIAGNOSIS — Z79899 Other long term (current) drug therapy: Secondary | ICD-10-CM

## 2015-03-14 DIAGNOSIS — T84038A Mechanical loosening of other internal prosthetic joint, initial encounter: Secondary | ICD-10-CM

## 2015-03-14 DIAGNOSIS — Z7982 Long term (current) use of aspirin: Secondary | ICD-10-CM | POA: Diagnosis not present

## 2015-03-14 DIAGNOSIS — T84032A Mechanical loosening of internal right knee prosthetic joint, initial encounter: Secondary | ICD-10-CM | POA: Diagnosis present

## 2015-03-14 DIAGNOSIS — D62 Acute posthemorrhagic anemia: Secondary | ICD-10-CM | POA: Diagnosis not present

## 2015-03-14 DIAGNOSIS — Z89021 Acquired absence of right finger(s): Secondary | ICD-10-CM

## 2015-03-14 DIAGNOSIS — Z96659 Presence of unspecified artificial knee joint: Secondary | ICD-10-CM

## 2015-03-14 DIAGNOSIS — Z96653 Presence of artificial knee joint, bilateral: Secondary | ICD-10-CM | POA: Diagnosis present

## 2015-03-14 DIAGNOSIS — E785 Hyperlipidemia, unspecified: Secondary | ICD-10-CM | POA: Diagnosis present

## 2015-03-14 DIAGNOSIS — F329 Major depressive disorder, single episode, unspecified: Secondary | ICD-10-CM | POA: Diagnosis present

## 2015-03-14 DIAGNOSIS — M25561 Pain in right knee: Secondary | ICD-10-CM | POA: Diagnosis present

## 2015-03-14 DIAGNOSIS — Y838 Other surgical procedures as the cause of abnormal reaction of the patient, or of later complication, without mention of misadventure at the time of the procedure: Secondary | ICD-10-CM | POA: Diagnosis present

## 2015-03-14 HISTORY — PX: TOTAL KNEE REVISION: SHX996

## 2015-03-14 LAB — GRAM STAIN

## 2015-03-14 SURGERY — TOTAL KNEE REVISION
Anesthesia: Monitor Anesthesia Care | Site: Knee | Laterality: Right

## 2015-03-14 MED ORDER — LACTATED RINGERS IV SOLN
INTRAVENOUS | Status: DC
  Administered 2015-03-14 (×2): via INTRAVENOUS

## 2015-03-14 MED ORDER — FENTANYL CITRATE (PF) 100 MCG/2ML IJ SOLN
INTRAMUSCULAR | Status: DC | PRN
  Administered 2015-03-14 (×2): 50 ug via INTRAVENOUS

## 2015-03-14 MED ORDER — KETOROLAC TROMETHAMINE 15 MG/ML IJ SOLN
7.5000 mg | Freq: Three times a day (TID) | INTRAMUSCULAR | Status: AC
  Administered 2015-03-14 – 2015-03-15 (×3): 7.5 mg via INTRAVENOUS
  Filled 2015-03-14 (×2): qty 1

## 2015-03-14 MED ORDER — BUPIVACAINE HCL (PF) 0.25 % IJ SOLN
INTRAMUSCULAR | Status: AC
  Filled 2015-03-14: qty 30

## 2015-03-14 MED ORDER — DIPHENHYDRAMINE HCL 12.5 MG/5ML PO ELIX: 12.5000 mg | ORAL_SOLUTION | ORAL | Status: DC | PRN

## 2015-03-14 MED ORDER — CEFUROXIME SODIUM 1.5 G IJ SOLR
INTRAMUSCULAR | Status: AC
Start: 2015-03-14 — End: 2015-03-14
  Filled 2015-03-14: qty 1.5

## 2015-03-14 MED ORDER — METHOCARBAMOL 500 MG PO TABS
ORAL_TABLET | ORAL | Status: AC
  Filled 2015-03-14: qty 1

## 2015-03-14 MED ORDER — PHENYLEPHRINE HCL 10 MG/ML IJ SOLN
10.0000 mg | INTRAVENOUS | Status: DC | PRN
  Administered 2015-03-14: 10 ug/min via INTRAVENOUS

## 2015-03-14 MED ORDER — DEXAMETHASONE SODIUM PHOSPHATE 10 MG/ML IJ SOLN
10.0000 mg | Freq: Two times a day (BID) | INTRAMUSCULAR | Status: AC
  Administered 2015-03-14 – 2015-03-15 (×2): 10 mg via INTRAVENOUS
  Filled 2015-03-14 (×2): qty 1

## 2015-03-14 MED ORDER — HYDROMORPHONE HCL 1 MG/ML IJ SOLN
INTRAMUSCULAR | Status: AC
  Filled 2015-03-14: qty 1

## 2015-03-14 MED ORDER — DEXAMETHASONE SODIUM PHOSPHATE 4 MG/ML IJ SOLN
INTRAMUSCULAR | Status: AC
  Filled 2015-03-14: qty 2

## 2015-03-14 MED ORDER — ONDANSETRON HCL 4 MG PO TABS: 4.0000 mg | ORAL_TABLET | Freq: Four times a day (QID) | ORAL | Status: DC | PRN

## 2015-03-14 MED ORDER — MIDAZOLAM HCL 2 MG/2ML IJ SOLN
INTRAMUSCULAR | Status: AC
  Filled 2015-03-14: qty 4

## 2015-03-14 MED ORDER — BUPIVACAINE LIPOSOME 1.3 % IJ SUSP
20.0000 mL | INTRAMUSCULAR | Status: AC
  Administered 2015-03-14: 20 mL
  Filled 2015-03-14: qty 20

## 2015-03-14 MED ORDER — DEXAMETHASONE SODIUM PHOSPHATE 4 MG/ML IJ SOLN
INTRAMUSCULAR | Status: DC | PRN
  Administered 2015-03-14: 4 mg via INTRAVENOUS

## 2015-03-14 MED ORDER — BUPIVACAINE HCL (PF) 0.5 % IJ SOLN
INTRAMUSCULAR | Status: AC
  Filled 2015-03-14: qty 20

## 2015-03-14 MED ORDER — ATENOLOL 50 MG PO TABS
100.0000 mg | ORAL_TABLET | Freq: Every morning | ORAL | Status: DC
  Administered 2015-03-15: 100 mg via ORAL
  Administered 2015-03-16: 50 mg via ORAL
  Filled 2015-03-14 (×2): qty 2

## 2015-03-14 MED ORDER — KETOROLAC TROMETHAMINE 15 MG/ML IJ SOLN
INTRAMUSCULAR | Status: AC
  Filled 2015-03-14: qty 1

## 2015-03-14 MED ORDER — FERROUS SULFATE 325 (65 FE) MG PO TABS
325.0000 mg | ORAL_TABLET | Freq: Every day | ORAL | Status: DC
  Administered 2015-03-15 – 2015-03-17 (×3): 325 mg via ORAL
  Filled 2015-03-14 (×3): qty 1

## 2015-03-14 MED ORDER — FLEET ENEMA 7-19 GM/118ML RE ENEM: 1.0000 | ENEMA | Freq: Once | RECTAL | Status: DC | PRN

## 2015-03-14 MED ORDER — FENTANYL CITRATE (PF) 250 MCG/5ML IJ SOLN
INTRAMUSCULAR | Status: AC
  Filled 2015-03-14: qty 5

## 2015-03-14 MED ORDER — METHOCARBAMOL 500 MG PO TABS
500.0000 mg | ORAL_TABLET | Freq: Four times a day (QID) | ORAL | Status: DC | PRN
  Administered 2015-03-14: 500 mg via ORAL
  Filled 2015-03-14 (×2): qty 1

## 2015-03-14 MED ORDER — SODIUM CHLORIDE 0.9 % IV SOLN
INTRAVENOUS | Status: DC
  Administered 2015-03-14: 100 mL/h via INTRAVENOUS
  Administered 2015-03-15: 06:00:00 via INTRAVENOUS

## 2015-03-14 MED ORDER — CITALOPRAM HYDROBROMIDE 20 MG PO TABS
20.0000 mg | ORAL_TABLET | Freq: Every day | ORAL | Status: DC
  Administered 2015-03-15 – 2015-03-16 (×2): 20 mg via ORAL
  Filled 2015-03-14 (×2): qty 1

## 2015-03-14 MED ORDER — PRAVASTATIN SODIUM 40 MG PO TABS
100.0000 mg | ORAL_TABLET | Freq: Every day | ORAL | Status: DC
  Administered 2015-03-14 – 2015-03-15 (×2): 100 mg via ORAL
  Filled 2015-03-14: qty 1
  Filled 2015-03-14: qty 2
  Filled 2015-03-14: qty 1
  Filled 2015-03-14: qty 2

## 2015-03-14 MED ORDER — METHOCARBAMOL 1000 MG/10ML IJ SOLN
500.0000 mg | Freq: Four times a day (QID) | INTRAVENOUS | Status: DC | PRN
  Filled 2015-03-14: qty 5

## 2015-03-14 MED ORDER — TRAZODONE HCL 50 MG PO TABS
50.0000 mg | ORAL_TABLET | Freq: Every day | ORAL | Status: DC
  Administered 2015-03-14 – 2015-03-16 (×3): 50 mg via ORAL
  Filled 2015-03-14 (×3): qty 1

## 2015-03-14 MED ORDER — BUPIVACAINE HCL (PF) 0.5 % IJ SOLN
INTRAMUSCULAR | Status: DC | PRN
  Administered 2015-03-14: 20 mL via INTRA_ARTICULAR

## 2015-03-14 MED ORDER — HYDROMORPHONE HCL 1 MG/ML IJ SOLN
0.5000 mg | INTRAMUSCULAR | Status: DC | PRN
  Filled 2015-03-14: qty 1

## 2015-03-14 MED ORDER — BISACODYL 5 MG PO TBEC: 5.0000 mg | DELAYED_RELEASE_TABLET | Freq: Every day | ORAL | Status: DC | PRN

## 2015-03-14 MED ORDER — CEFUROXIME SODIUM 1.5 G IJ SOLR
INTRAMUSCULAR | Status: DC | PRN
  Administered 2015-03-14: 1.5 g

## 2015-03-14 MED ORDER — ALUM & MAG HYDROXIDE-SIMETH 200-200-20 MG/5ML PO SUSP: 30.0000 mL | ORAL | Status: DC | PRN

## 2015-03-14 MED ORDER — ONDANSETRON HCL 4 MG/2ML IJ SOLN
INTRAMUSCULAR | Status: AC
  Filled 2015-03-14: qty 2

## 2015-03-14 MED ORDER — DOCUSATE SODIUM 100 MG PO CAPS
100.0000 mg | ORAL_CAPSULE | Freq: Two times a day (BID) | ORAL | Status: DC
  Administered 2015-03-14 – 2015-03-16 (×5): 100 mg via ORAL
  Filled 2015-03-14 (×5): qty 1

## 2015-03-14 MED ORDER — ONDANSETRON HCL 4 MG/2ML IJ SOLN
INTRAMUSCULAR | Status: DC | PRN
  Administered 2015-03-14: 4 mg via INTRAVENOUS

## 2015-03-14 MED ORDER — POLYETHYLENE GLYCOL 3350 17 G PO PACK: 17.0000 g | PACK | Freq: Every day | ORAL | Status: DC | PRN

## 2015-03-14 MED ORDER — OXYCODONE-ACETAMINOPHEN 5-325 MG PO TABS: 1.0000 | ORAL_TABLET | ORAL | Status: DC | PRN

## 2015-03-14 MED ORDER — PROPOFOL INFUSION 10 MG/ML OPTIME
INTRAVENOUS | Status: DC | PRN
  Administered 2015-03-14: 25 ug/kg/min via INTRAVENOUS

## 2015-03-14 MED ORDER — OXYCODONE-ACETAMINOPHEN 5-325 MG PO TABS
ORAL_TABLET | ORAL | Status: AC
  Filled 2015-03-14: qty 1

## 2015-03-14 MED ORDER — LOSARTAN POTASSIUM 50 MG PO TABS
50.0000 mg | ORAL_TABLET | Freq: Every day | ORAL | Status: DC
  Administered 2015-03-14 – 2015-03-16 (×3): 50 mg via ORAL
  Filled 2015-03-14 (×3): qty 1

## 2015-03-14 MED ORDER — CEFUROXIME SODIUM 750 MG IJ SOLR
INTRAMUSCULAR | Status: AC
  Filled 2015-03-14: qty 750

## 2015-03-14 MED ORDER — SODIUM CHLORIDE 0.9 % IR SOLN
Status: DC | PRN
  Administered 2015-03-14: 3000 mL
  Administered 2015-03-14: 1000 mL

## 2015-03-14 MED ORDER — OXYCODONE-ACETAMINOPHEN 5-325 MG PO TABS
1.0000 | ORAL_TABLET | ORAL | Status: DC | PRN
  Administered 2015-03-14: 2 via ORAL
  Administered 2015-03-14: 1 via ORAL
  Administered 2015-03-15 – 2015-03-17 (×10): 2 via ORAL
  Filled 2015-03-14 (×14): qty 2

## 2015-03-14 MED ORDER — ASPIRIN EC 325 MG PO TBEC
325.0000 mg | DELAYED_RELEASE_TABLET | Freq: Two times a day (BID) | ORAL | Status: DC
  Administered 2015-03-15 – 2015-03-17 (×4): 325 mg via ORAL
  Filled 2015-03-14 (×5): qty 1

## 2015-03-14 MED ORDER — CEFAZOLIN SODIUM-DEXTROSE 2-3 GM-% IV SOLR
2.0000 g | Freq: Four times a day (QID) | INTRAVENOUS | Status: AC
  Administered 2015-03-14 – 2015-03-15 (×2): 2 g via INTRAVENOUS
  Filled 2015-03-14 (×2): qty 50

## 2015-03-14 MED ORDER — PROMETHAZINE HCL 25 MG/ML IJ SOLN: 6.2500 mg | INTRAMUSCULAR | Status: DC | PRN

## 2015-03-14 MED ORDER — HYDROMORPHONE HCL 1 MG/ML IJ SOLN
0.2500 mg | INTRAMUSCULAR | Status: DC | PRN
  Administered 2015-03-14 (×2): 0.5 mg via INTRAVENOUS

## 2015-03-14 MED ORDER — LEVOTHYROXINE SODIUM 125 MCG PO TABS
125.0000 ug | ORAL_TABLET | Freq: Every day | ORAL | Status: DC
  Administered 2015-03-15 – 2015-03-17 (×3): 125 ug via ORAL
  Filled 2015-03-14 (×3): qty 1

## 2015-03-14 MED ORDER — ASPIRIN EC 325 MG PO TBEC: 325.0000 mg | DELAYED_RELEASE_TABLET | Freq: Two times a day (BID) | ORAL | Status: DC

## 2015-03-14 MED ORDER — MIDAZOLAM HCL 5 MG/5ML IJ SOLN
INTRAMUSCULAR | Status: DC | PRN
  Administered 2015-03-14 (×2): 1 mg via INTRAVENOUS

## 2015-03-14 MED ORDER — ONDANSETRON HCL 4 MG/2ML IJ SOLN: 4.0000 mg | Freq: Four times a day (QID) | INTRAMUSCULAR | Status: DC | PRN

## 2015-03-14 MED ORDER — ACETAMINOPHEN 325 MG PO TABS: 650.0000 mg | ORAL_TABLET | Freq: Four times a day (QID) | ORAL | Status: DC | PRN

## 2015-03-14 MED ORDER — ACETAMINOPHEN 650 MG RE SUPP: 650.0000 mg | Freq: Four times a day (QID) | RECTAL | Status: DC | PRN

## 2015-03-14 SURGICAL SUPPLY — 86 items
APL SKNCLS STERI-STRIP NONHPOA (GAUZE/BANDAGES/DRESSINGS) ×1
AUG TIB SZ2.5 5 REV STP WDG (Knees) ×1 IMPLANT
BANDAGE ESMARK 6X9 LF (GAUZE/BANDAGES/DRESSINGS) ×1 IMPLANT
BENZOIN TINCTURE PRP APPL 2/3 (GAUZE/BANDAGES/DRESSINGS) ×3 IMPLANT
BIT DRILL 2.5X110 QC LCP DISP (BIT) ×2 IMPLANT
BIT DRILL QC 3.5X110 (BIT) ×2 IMPLANT
BLADE SAGITTAL 25.0X1.19X90 (BLADE) ×2 IMPLANT
BLADE SAGITTAL 25.0X1.19X90MM (BLADE) ×1
BLADE SAW SGTL 13X75X1.27 (BLADE) ×3 IMPLANT
BNDG CMPR 9X6 STRL LF SNTH (GAUZE/BANDAGES/DRESSINGS) ×1
BNDG ESMARK 6X9 LF (GAUZE/BANDAGES/DRESSINGS) ×3
BOWL SMART MIX CTS (DISPOSABLE) ×3 IMPLANT
CEMENT HV SMART SET (Cement) ×4 IMPLANT
CLOSURE WOUND 1/2 X4 (GAUZE/BANDAGES/DRESSINGS) ×1
COVER BACK TABLE 24X17X13 BIG (DRAPES) IMPLANT
COVER SURGICAL LIGHT HANDLE (MISCELLANEOUS) ×3 IMPLANT
CUFF TOURNIQUET SINGLE 34IN LL (TOURNIQUET CUFF) ×2 IMPLANT
CUFF TOURNIQUET SINGLE 44IN (TOURNIQUET CUFF) IMPLANT
DRAPE EXTREMITY T 121X128X90 (DRAPE) ×3 IMPLANT
DRAPE IMP U-DRAPE 54X76 (DRAPES) ×3 IMPLANT
DRAPE U-SHAPE 47X51 STRL (DRAPES) ×3 IMPLANT
DRSG AQUACEL AG ADV 3.5X10 (GAUZE/BANDAGES/DRESSINGS) ×2 IMPLANT
DRSG PAD ABDOMINAL 8X10 ST (GAUZE/BANDAGES/DRESSINGS) ×3 IMPLANT
DURAPREP 26ML APPLICATOR (WOUND CARE) ×3 IMPLANT
ELECT REM PT RETURN 9FT ADLT (ELECTROSURGICAL) ×3
ELECTRODE REM PT RTRN 9FT ADLT (ELECTROSURGICAL) ×1 IMPLANT
EVACUATOR 1/8 PVC DRAIN (DRAIN) ×3 IMPLANT
FACESHIELD WRAPAROUND (MASK) ×6 IMPLANT
FACESHIELD WRAPAROUND OR TEAM (MASK) ×1 IMPLANT
GAUZE SPONGE 4X4 12PLY STRL (GAUZE/BANDAGES/DRESSINGS) ×3 IMPLANT
GAUZE XEROFORM 5X9 LF (GAUZE/BANDAGES/DRESSINGS) ×3 IMPLANT
GLOVE BIOGEL PI IND STRL 6.5 (GLOVE) ×4 IMPLANT
GLOVE BIOGEL PI IND STRL 8 (GLOVE) ×2 IMPLANT
GLOVE BIOGEL PI INDICATOR 6.5 (GLOVE) ×8
GLOVE BIOGEL PI INDICATOR 8 (GLOVE) ×4
GLOVE ECLIPSE 7.5 STRL STRAW (GLOVE) ×8 IMPLANT
GOWN STRL REUS W/ TWL LRG LVL3 (GOWN DISPOSABLE) ×1 IMPLANT
GOWN STRL REUS W/ TWL XL LVL3 (GOWN DISPOSABLE) ×2 IMPLANT
GOWN STRL REUS W/TWL LRG LVL3 (GOWN DISPOSABLE) ×3
GOWN STRL REUS W/TWL XL LVL3 (GOWN DISPOSABLE) ×6
HANDPIECE INTERPULSE COAX TIP (DISPOSABLE) ×3
HOOD PEEL AWAY FACE SHEILD DIS (HOOD) ×9 IMPLANT
IMMOBILIZER KNEE 20 (SOFTGOODS) IMPLANT
IMMOBILIZER KNEE 22 UNIV (SOFTGOODS) ×3 IMPLANT
IMMOBILIZER KNEE 24 THIGH 36 (MISCELLANEOUS) IMPLANT
IMMOBILIZER KNEE 24 UNIV (MISCELLANEOUS)
INSERT TIB LCS RP STD+ 20 (Knees) ×2 IMPLANT
KIT BASIN OR (CUSTOM PROCEDURE TRAY) ×3 IMPLANT
KIT ROOM TURNOVER OR (KITS) ×3 IMPLANT
MANIFOLD NEPTUNE II (INSTRUMENTS) ×3 IMPLANT
NDL HYPO 25GX1X1/2 BEV (NEEDLE) IMPLANT
NDL SPNL 22GX3.5 QUINCKE BK (NEEDLE) IMPLANT
NEEDLE HYPO 25GX1X1/2 BEV (NEEDLE) IMPLANT
NEEDLE SPNL 22GX3.5 QUINCKE BK (NEEDLE) ×3 IMPLANT
NS IRRIG 1000ML POUR BTL (IV SOLUTION) ×3 IMPLANT
PACK TOTAL JOINT (CUSTOM PROCEDURE TRAY) ×3 IMPLANT
PACK UNIVERSAL I (CUSTOM PROCEDURE TRAY) ×3 IMPLANT
PAD ARMBOARD 7.5X6 YLW CONV (MISCELLANEOUS) ×6 IMPLANT
PAD CAST 4YDX4 CTTN HI CHSV (CAST SUPPLIES) ×1 IMPLANT
PADDING CAST COTTON 4X4 STRL (CAST SUPPLIES) ×3
PADDING CAST COTTON 6X4 STRL (CAST SUPPLIES) ×6 IMPLANT
PASSER SUT SWANSON 36MM LOOP (INSTRUMENTS) ×2 IMPLANT
SCREW CORTEX 3.5 50MM (Screw) ×2 IMPLANT
SET HNDPC FAN SPRY TIP SCT (DISPOSABLE) ×1 IMPLANT
SPONGE GAUZE 4X4 12PLY STER LF (GAUZE/BANDAGES/DRESSINGS) ×2 IMPLANT
STAPLER VISISTAT 35W (STAPLE) ×2 IMPLANT
STEM UNIVERSAL REVISION 75X12 (Stem) ×2 IMPLANT
STRIP CLOSURE SKIN 1/2X4 (GAUZE/BANDAGES/DRESSINGS) ×2 IMPLANT
SUCTION FRAZIER TIP 10 FR DISP (SUCTIONS) ×3 IMPLANT
SUT FIBERWIRE 2-0 18 17.9 3/8 (SUTURE) ×6
SUT MON AB 3-0 SH 27 (SUTURE)
SUT MON AB 3-0 SH27 (SUTURE) IMPLANT
SUT VIC AB 0 CTB1 27 (SUTURE) ×6 IMPLANT
SUT VIC AB 1 CT1 27 (SUTURE) ×9
SUT VIC AB 1 CT1 27XBRD ANBCTR (SUTURE) ×2 IMPLANT
SUT VIC AB 2-0 CTB1 (SUTURE) ×6 IMPLANT
SUTURE FIBERWR 2-0 18 17.9 3/8 (SUTURE) IMPLANT
SWAB COLLECTION DEVICE MRSA (MISCELLANEOUS) ×2 IMPLANT
SYR CONTROL 10ML LL (SYRINGE) IMPLANT
SYRINGE 60CC LL (MISCELLANEOUS) ×2 IMPLANT
TOWEL OR 17X24 6PK STRL BLUE (TOWEL DISPOSABLE) ×3 IMPLANT
TOWEL OR 17X26 10 PK STRL BLUE (TOWEL DISPOSABLE) ×3 IMPLANT
TRAY REVISION SZ 2.5 (Knees) ×3 IMPLANT
TUBE ANAEROBIC SPECIMEN COL (MISCELLANEOUS) ×5 IMPLANT
WATER STERILE IRR 1000ML POUR (IV SOLUTION) ×9 IMPLANT
WEDGE STEP SZ.5 5MM (Knees) ×3 IMPLANT

## 2015-03-14 NOTE — Anesthesia Procedure Notes (Signed)
Spinal Patient location during procedure: OR Start time: 03/14/2015 2:24 PM End time: 03/14/2015 2:35 PM Staffing Anesthesiologist: Zayaan Kozak Preanesthetic Checklist Completed: patient identified, site marked, surgical consent, pre-op evaluation, timeout performed, IV checked and monitors and equipment checked Spinal Block Patient position: sitting Prep: ChloraPrep Patient monitoring: heart rate, cardiac monitor, continuous pulse ox and blood pressure Approach: right paramedian Location: L3-4 Injection technique: single-shot Needle Needle type: Tuohy  Needle gauge: 25 G Needle length: 9 cm Needle insertion depth: 9 cm Assessment Sensory level: T6 Additional Notes Tolerated well

## 2015-03-14 NOTE — Anesthesia Postprocedure Evaluation (Addendum)
  Anesthesia Post-op Note  Patient: Samantha Stone  Procedure(s) Performed: Procedure(s) (LRB): TIBIAL REVISION, OPEN REDUCTION INTERNAL FIXATION PROXIMAL TIBIA (Right)  Patient Location: PACU  Anesthesia Type: Spinal  Level of Consciousness: awake and alert   Airway and Oxygen Therapy: Patient Spontanous Breathing  Post-op Pain: mild  Post-op Assessment: Post-op Vital signs reviewed, Patient's Cardiovascular Status Stable, Respiratory Function Stable, Patent Airway and No signs of Nausea or vomiting  Last Vitals:  Filed Vitals:   03/14/15 1949  BP: 131/57  Pulse: 46  Temp: 36.4 C  Resp: 17    Post-op Vital Signs: stable   Complications: No apparent anesthesia complications

## 2015-03-14 NOTE — H&P (Signed)
TOTAL KNEE REVISION ADMISSION H&P  Patient is being admitted for right revision total knee arthroplasty.  Subjective:  Chief Complaint:right knee pain.  HPI: Samantha Stone, 81 y.o. female, has a history of pain and functional disability in the right knee(s) due to failed previous arthroplasty and patient has failed non-surgical conservative treatments for greater than 12 weeks to include NSAID's and/or analgesics, use of assistive devices and activity modification. The indications for the revision of the total knee arthroplasty are loosening of one or more components. Onset of symptoms was gradual starting 1 years ago with gradually worsening course since that time.  Prior procedures on the right knee(s) include arthroplasty.  Patient currently rates pain in the right knee(s) at 9 out of 10 with activity. There is night pain, worsening of pain with activity and weight bearing, pain that interferes with activities of daily living, pain with passive range of motion, crepitus and joint swelling.  Patient has evidence of prosthetic loosening by imaging studies. This condition presents safety issues increasing the risk of falls. 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  There is no current active infection.  Patient Active Problem List   Diagnosis Date Noted  . 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 Confusion 06/16/2014  . Acute encephalopathy 06/16/2014  . HLD (hyperlipidemia) 06/16/2014  . Hypothyroidism 06/16/2014  . Frontal headache   . Aphasia 10/23/2013  . Essential hypertension 10/23/2013   Past Medical History  Diagnosis Date  . Hypertension   . Hypothyroidism   . Arthritis   . Concussion   . Headache   . Depression   . Stress incontinence   . Cancer     basal cell skin cancer    Past Surgical History  Procedure Laterality Date  . I&d extremity  03/11/2012    Procedure: MINOR IRRIGATION AND DEBRIDEMENT EXTREMITY;  Surgeon:  Cammie Sickle., MD;  Location: [facility];  Service: Orthopedics;  Laterality: Right;  Right little finger  . Tonsillectomy    . Appendectomy    . Abdominal hysterectomy    . Trigger finger release    . Knee arthroscopy      bilat   . Replacement total knee bilateral    . Carpal tunnel release      rt x2  . Tubal ligation    . Colonoscopy    . Finger amputation Right     little finger  . Eye surgery Bilateral     cataract surgery with lens implant    Prescriptions prior to admission  Medication Sig Dispense Refill Last Dose  . Ascorbic Acid (VITAMIN C PO) Take 1 capsule by mouth daily.     Marland Kitchen aspirin 81 MG tablet Take 81 mg by mouth daily.   Taking  . atenolol (TENORMIN) 50 MG tablet Take 100 mg by mouth every morning.    Taking  . BIOTIN PO Take 1 tablet by mouth daily.     Marland Kitchen CALCIUM-VITAMIN D PO Take 1 tablet by mouth daily.     . citalopram (CELEXA) 20 MG tablet Take 20 mg by mouth daily.     . ferrous sulfate 325 (65 FE) MG tablet Take 325 mg by mouth daily with breakfast.     . levothyroxine (SYNTHROID, LEVOTHROID) 125 MCG tablet Take 125 mcg by mouth daily.     Marland Kitchen losartan (COZAAR) 50 MG tablet Take 50 mg by mouth daily.     . naproxen  sodium (ANAPROX) 220 MG tablet Take 220 mg by mouth daily as needed (pain).     . Omega-3 Fatty Acids (FISH OIL PO) Take 1 capsule by mouth daily.    Taking  . oxyCODONE-acetaminophen (PERCOCET/ROXICET) 5-325 MG per tablet Take 1-2 tablets by mouth every 4 (four) hours as needed for severe pain.     . pravastatin (PRAVACHOL) 10 MG tablet Take 10 tablets by mouth daily.  0   . traZODone (DESYREL) 50 MG tablet Take 50 mg by mouth at bedtime.   Taking   Allergies  Allergen Reactions  . Phenergan [Promethazine Hcl]     hallucinations    Social History  Substance Use Topics  . Smoking status: Never Smoker   . Smokeless tobacco: Never Used  . Alcohol Use: 8.4 oz/week    14 Cans of beer per week     Comment: occ    Family  History  Problem Relation Age of Onset  . Bladder Cancer Father       ROS  ROS: I have reviewed the patient's review of systems thoroughly and there are no positive responses as relates to the HPI.  Objective:  Physical Exam  Vital signs in last 24 hours:   Well-developed well-nourished patient in no acute distress. Alert and oriented x3 HEENT:within normal limits Cardiac: Regular rate and rhythm Pulmonary: Lungs clear to auscultation Abdomen: Soft and nontender.  Normal active bowel sounds  Musculoskeletal: (Right knee: Pain to range of motion.  No instability.  Limited range of motion. Labs: Recent Results (from the past 2160 hour(s))  Surgical pcr screen     Status: None   Collection Time: 03/08/15 11:09 AM  Result Value Ref Range   MRSA, PCR NEGATIVE NEGATIVE   Staphylococcus aureus NEGATIVE NEGATIVE    Comment:        The Xpert SA Assay (FDA approved for NASAL specimens in patients over 50 years of age), is one component of a comprehensive surveillance program.  Test performance has been validated by [facility] for patients greater than or equal to 42 year old. It is not intended to diagnose infection nor to guide or monitor treatment.   Urinalysis, Routine w reflex microscopic (not at [facility])     Status: Abnormal   Collection Time: 03/08/15 11:09 AM  Result Value Ref Range   Color, Urine YELLOW YELLOW   APPearance CLEAR CLEAR   Specific Gravity, Urine 1.018 1.005 - 1.030   pH 7.5 5.0 - 8.0   Glucose, UA NEGATIVE NEGATIVE mg/dL   Hgb urine dipstick NEGATIVE NEGATIVE   Bilirubin Urine NEGATIVE NEGATIVE   Ketones, ur NEGATIVE NEGATIVE mg/dL   Protein, ur NEGATIVE NEGATIVE mg/dL   Urobilinogen, UA 1.0 0.0 - 1.0 mg/dL   Nitrite NEGATIVE NEGATIVE   Leukocytes, UA TRACE (A) NEGATIVE  Urine microscopic-add on     Status: Abnormal   Collection Time: 03/08/15 11:09 AM  Result Value Ref Range   Squamous Epithelial / LPF FEW (A) RARE   WBC, UA 0-2 <3 WBC/hpf    RBC / HPF 0-2 <3 RBC/hpf  APTT     Status: None   Collection Time: 03/08/15 11:10 AM  Result Value Ref Range   aPTT 35 24 - 37 seconds  CBC WITH DIFFERENTIAL     Status: Abnormal   Collection Time: 03/08/15 11:10 AM  Result Value Ref Range   WBC 6.3 4.0 - 10.5 K/uL   RBC 4.76 3.87 - 5.11 MIL/uL   Hemoglobin 14.1 12.0 -  15.0 g/dL   HCT 42.1 36.0 - 46.0 %   MCV 88.4 78.0 - 100.0 fL   MCH 29.6 26.0 - 34.0 pg   MCHC 33.5 30.0 - 36.0 g/dL   RDW 13.9 11.5 - 15.5 %   Platelets 273 150 - 400 K/uL   Neutrophils Relative % 48 43 - 77 %   Neutro Abs 3.1 1.7 - 7.7 K/uL   Lymphocytes Relative 28 12 - 46 %   Lymphs Abs 1.8 0.7 - 4.0 K/uL   Monocytes Relative 19 (H) 3 - 12 %   Monocytes Absolute 1.2 (H) 0.1 - 1.0 K/uL   Eosinophils Relative 4 0 - 5 %   Eosinophils Absolute 0.3 0.0 - 0.7 K/uL   Basophils Relative 1 0 - 1 %   Basophils Absolute 0.0 0.0 - 0.1 K/uL  Comprehensive metabolic panel     Status: Abnormal   Collection Time: 03/08/15 11:10 AM  Result Value Ref Range   Sodium 135 135 - 145 mmol/L   Potassium 4.2 3.5 - 5.1 mmol/L   Chloride 99 (L) 101 - 111 mmol/L   CO2 26 22 - 32 mmol/L   Glucose, Bld 107 (H) 65 - 99 mg/dL   BUN 9 6 - 20 mg/dL   Creatinine, Ser 1.04 (H) 0.44 - 1.00 mg/dL   Calcium 9.5 8.9 - 10.3 mg/dL   Total Protein 6.7 6.5 - 8.1 g/dL   Albumin 3.7 3.5 - 5.0 g/dL   AST 29 15 - 41 U/L   ALT 20 14 - 54 U/L   Alkaline Phosphatase 66 38 - 126 U/L   Total Bilirubin 0.9 0.3 - 1.2 mg/dL   GFR calc non Af Amer 49 (L) >60 mL/min   GFR calc Af Amer 56 (L) >60 mL/min    Comment: (NOTE) The eGFR has been calculated using the CKD EPI equation. This calculation has not been validated in all clinical situations. eGFR's persistently <60 mL/min signify possible Chronic Kidney Disease.    Anion gap 10 5 - 15  Protime-INR     Status: None   Collection Time: 03/08/15 11:10 AM  Result Value Ref Range   Prothrombin Time 14.9 11.6 - 15.2 seconds   INR 1.15 0.00 - 1.49  Type  and screen     Status: None   Collection Time: 03/08/15 11:20 AM  Result Value Ref Range   ABO/RH(D) O POS    Antibody Screen NEG    Sample Expiration 03/22/2015   ABO/Rh     Status: None   Collection Time: 03/08/15 11:20 AM  Result Value Ref Range   ABO/RH(D) O POS    Estimated body mass index is 44.06 kg/(m^2) as calculated from the following:   Height as of 09/17/14: _0  (1.626 m).   Weight as of 09/17/14: 256 lb 12.8 oz (116.484 kg).  Imaging Review Plain radiographs demonstrate moderate degenerative joint disease of the right knee(s). The overall alignment is mild varus.There is evidence of loosening of the tibial components. The bone quality appears to be fair for age and reported activity level. There is Malalignment based on loosening of the tibial component.  Assessment/Plan:  End stage arthritis, right knee(s) with failed previous arthroplasty.   The patient history, physical examination, clinical judgment of the provider and imaging studies are consistent with end stage degenerative joint disease of the right knee(s), previous total knee arthroplasty. Revision total knee arthroplasty is deemed medically necessary. The treatment options including medical management, injection therapy, arthroscopy and  revision arthroplasty were discussed at length. The risks and benefits of revision total knee arthroplasty were presented and reviewed. The risks due to aseptic loosening, infection, stiffness, patella tracking problems, thromboembolic complications and other imponderables were discussed. The patient acknowledged the explanation, agreed to proceed with the plan and consent was signed. Patient is being admitted for inpatient treatment for surgery, pain control, PT, OT, prophylactic antibiotics, VTE prophylaxis, progressive ambulation and ADL's and discharge planning.The patient is planning to be discharged to skilled nursing facility

## 2015-03-14 NOTE — Discharge Instructions (Signed)
INSTRUCTIONS AFTER JOINT REPLACEMENT   o Remove items at home which could result in a fall. This includes throw rugs or furniture in walking pathways o ICE to the affected joint every three hours while awake for 30 minutes at a time, for at least the first 3-5 days, and then as needed for pain and swelling.  Continue to use ice for pain and swelling. You may notice swelling that will progress down to the foot and ankle.  This is normal after surgery.  Elevate your leg when you are not up walking on it.   o Continue to use the breathing machine you got in the hospital (incentive spirometer) which will help keep your temperature down.  It is common for your temperature to cycle up and down following surgery, especially at night when you are not up moving around and exerting yourself.  The breathing machine keeps your lungs expanded and your temperature down.   DIET:  As you were doing prior to hospitalization, we recommend a well-balanced diet.  DRESSING / WOUND CARE / SHOWERING  Keep the surgical dressing until follow up.  The dressing is water proof, so you can shower without any extra covering.  IF THE DRESSING FALLS OFF or the wound gets wet inside, change the dressing with sterile gauze.  Please use good hand washing techniques before changing the dressing.  Do not use any lotions or creams on the incision until instructed by your surgeon.    ACTIVITY  o Increase activity slowly as tolerated, but follow the weight bearing instructions below.   o No driving for 6 weeks or until further direction given by your physician.  You cannot drive while taking narcotics.  o No lifting or carrying greater than 10 lbs. until further directed by your surgeon. o Avoid periods of inactivity such as sitting longer than an hour when not asleep. This helps prevent blood clots.  o You may return to work once you are authorized by your doctor.     WEIGHT BEARING   Weight bearing as tolerated with assist  device (walker, cane, etc) as directed, use it as long as suggested by your surgeon or therapist, typically at least 4-6 weeks.  Wear knee immobilizer when walking.   EXERCISES  Results after joint replacement surgery are often greatly improved when you follow the exercise, range of motion and muscle strengthening exercises prescribed by your doctor. Safety measures are also important to protect the joint from further injury. Any time any of these exercises cause you to have increased pain or swelling, decrease what you are doing until you are comfortable again and then slowly increase them. If you have problems or questions, call your caregiver or physical therapist for advice.   Rehabilitation is important following a joint replacement. After just a few days of immobilization, the muscles of the leg can become weakened and shrink (atrophy).  These exercises are designed to build up the tone and strength of the thigh and leg muscles and to improve motion. Often times heat used for twenty to thirty minutes before working out will loosen up your tissues and help with improving the range of motion but do not use heat for the first two weeks following surgery (sometimes heat can increase post-operative swelling).   These exercises can be done on a training (exercise) mat, on the floor, on a table or on a bed. Use whatever works the best and is most comfortable for you.    Use music or television  while you are exercising so that the exercises are a pleasant break in your day. This will make your life better with the exercises acting as a break in your routine that you can look forward to.   Perform all exercises about fifteen times, three times per day or as directed.  You should exercise both the operative leg and the other leg as well.  Exercises include:    Quad Sets - Tighten up the muscle on the front of the thigh (Quad) and hold for 5-10 seconds.    Straight Leg Raises - With your knee straight  (if you were given a brace, keep it on), lift the leg to 60 degrees, hold for 3 seconds, and slowly lower the leg.  Perform this exercise against resistance later as your leg gets stronger.   Leg Slides: Lying on your back, slowly slide your foot toward your buttocks, bending your knee up off the floor (only go as far as is comfortable). Then slowly slide your foot back down until your leg is flat on the floor again.   Angel Wings: Lying on your back spread your legs to the side as far apart as you can without causing discomfort.   Hamstring Strength:  Lying on your back, push your heel against the floor with your leg straight by tightening up the muscles of your buttocks.  Repeat, but this time bend your knee to a comfortable angle, and push your heel against the floor.  You may put a pillow under the heel to make it more comfortable if necessary.   A rehabilitation program following joint replacement surgery can speed recovery and prevent re-injury in the future due to weakened muscles. Contact your doctor or a physical therapist for more information on knee rehabilitation.    CONSTIPATION  Constipation is defined medically as fewer than three stools per week and severe constipation as less than one stool per week.  Even if you have a regular bowel pattern at home, your normal regimen is likely to be disrupted due to multiple reasons following surgery.  Combination of anesthesia, postoperative narcotics, change in appetite and fluid intake all can affect your bowels.   YOU MUST use at least one of the following options; they are listed in order of increasing strength to get the job done.  They are all available over the counter, and you may need to use some, POSSIBLY even all of these options:    Drink plenty of fluids (prune juice may be helpful) and high fiber foods Colace 100 mg by mouth twice a day  Senokot for constipation as directed and as needed Dulcolax (bisacodyl), take with full glass  of water  Miralax (polyethylene glycol) once or twice a day as needed.  If you have tried all these things and are unable to have a bowel movement in the first 3-4 days after surgery call either your surgeon or your primary doctor.    If you experience loose stools or diarrhea, hold the medications until you stool forms back up.  If your symptoms do not get better within 1 week or if they get worse, check with your doctor.  If you experience "the worst abdominal pain ever" or develop nausea or vomiting, please contact the office immediately for further recommendations for treatment.   ITCHING:  If you experience itching with your medications, try taking only a single pain pill, or even half a pain pill at a time.  You can also use Benadryl over the  counter for itching or also to help with sleep.   TED HOSE STOCKINGS:  Use stockings on both legs until for at least 2 weeks or as directed by physician office. They may be removed at night for sleeping.  MEDICATIONS:  See your medication summary on the After Visit Summary that nursing will review with you.  You may have some home medications which will be placed on hold until you complete the course of blood thinner medication.  It is important for you to complete the blood thinner medication as prescribed.  PRECAUTIONS:  If you experience chest pain or shortness of breath - call 911 immediately for transfer to the hospital emergency department.   If you develop a fever greater that 101 F, purulent drainage from wound, increased redness or drainage from wound, foul odor from the wound/dressing, or calf pain - CONTACT YOUR SURGEON.                                                   FOLLOW-UP APPOINTMENTS:  If you do not already have a post-op appointment, please call the office for an appointment to be seen by your surgeon.  Guidelines for how soon to be seen are listed in your After Visit Summary, but are typically between 1-4 weeks after  surgery.  OTHER INSTRUCTIONS:   Knee Replacement:  Do not place pillow under knee, focus on keeping the knee straight while resting. CPM instructions: 0-90 degrees, 2 hours in the morning, 2 hours in the afternoon, and 2 hours in the evening. Place foam block, curve side up under heel at all times except when in CPM or when walking.  DO NOT modify, tear, cut, or change the foam block in any way.  MAKE SURE YOU:   Understand these instructions.   Get help right away if you are not doing well or get worse.    Thank you for letting us be a part of your medical care team.  It is a privilege we respect greatly.  We hope these instructions will help you stay on track for a fast and full recovery!

## 2015-03-14 NOTE — Anesthesia Preprocedure Evaluation (Addendum)
Anesthesia Evaluation  Patient identified by MRN, date of birth, ID band Patient awake    Reviewed: Allergy & Precautions, NPO status , Patient's Chart, lab work & pertinent test results  Airway Mallampati: II  TM Distance: >3 FB Neck ROM: Full    Dental no notable dental hx. (+) Dental Advisory Given, Teeth Intact   Pulmonary neg pulmonary ROS,  breath sounds clear to auscultation  Pulmonary exam normal       Cardiovascular hypertension, Pt. on medications and Pt. on home beta blockers Normal cardiovascular examRhythm:Regular Rate:Normal  Atenolol at 8 a.m.   Neuro/Psych  Headaches, PSYCHIATRIC DISORDERS Depression negative psych ROS   GI/Hepatic negative GI ROS, Neg liver ROS,   Endo/Other  Hypothyroidism Morbid obesity  Renal/GU negative Renal ROS  negative genitourinary   Musculoskeletal  (+) Arthritis -, Osteoarthritis,    Abdominal   Peds negative pediatric ROS (+)  Hematology   Anesthesia Other Findings Crowns  Reproductive/Obstetrics negative OB ROS                          Anesthesia Physical Anesthesia Plan  ASA: II  Anesthesia Plan: Spinal and MAC   Post-op Pain Management:    Induction: Intravenous  Airway Management Planned:   Additional Equipment:   Intra-op Plan:   Post-operative Plan: Extubation in OR  Informed Consent: I have reviewed the patients History and Physical, chart, labs and discussed the procedure including the risks, benefits and alternatives for the proposed anesthesia with the patient or authorized representative who has indicated his/her understanding and acceptance.   Dental advisory given  Plan Discussed with: CRNA and Surgeon  Anesthesia Plan Comments: (Discussed risks/benefits/alternatives concerning general anesthesia and regional anesthesia for post op pain control.  Labs reviewed, questions answered.  Patient is requesting spinal and MAC  sedation.)      Anesthesia Quick Evaluation

## 2015-03-14 NOTE — Progress Notes (Signed)
Orthopedic Tech Progress Note Patient Details:  Samantha Stone November 13, 1930 [id number]  CPM Right Knee CPM Right Knee: On Right Knee Flexion (Degrees): 90 Right Knee Extension (Degrees): 0 Additional Comments:  (decreased for patient comfort)   Braulio Bosch 03/14/2015, 8:18 PM

## 2015-03-14 NOTE — Brief Op Note (Signed)
03/14/2015  5:11 PM  PATIENT:  Samantha Stone  81 y.o. female  PRE-OPERATIVE DIAGNOSIS:  loosening of tibial prothesis right knee, s/p total knee  POST-OPERATIVE DIAGNOSIS:  oosening of tibial prothesis right knee, s/p total knee  PROCEDURE:  Procedure(s): TIBIAL REVISION, OPEN REDUCTION INTERNAL FIXATION PROXIMAL TIBIA (Right)  SURGEON:  Surgeon(s) and Role:    * Dorna Leitz, MD - Primary  PHYSICIAN ASSISTANT:   ASSISTANTS: bethune   ANESTHESIA:   spinal  EBL:  Total I/O In: 1000 [I.V.:1000] Out: 725 [Urine:525; Blood:200]  BLOOD ADMINISTERED:none  DRAINS: (1) Hemovact drain(s) in the rv knee with  Suction Open   LOCAL MEDICATIONS USED:  OTHER experel  SPECIMEN:  No Specimen  DISPOSITION OF SPECIMEN:  N/A  COUNTS:  YES  TOURNIQUET:   Total Tourniquet Time Documented: Thigh (Right) - 115 minutes Total: Thigh (Right) - 115 minutes   DICTATION: .Other Dictation: Dictation Number [id number]  PLAN OF CARE: Admit to inpatient   PATIENT DISPOSITION:  PACU - hemodynamically stable.   Delay start of Pharmacological VTE agent (>24hrs) due to surgical blood loss or risk of bleeding: no

## 2015-03-15 ENCOUNTER — Encounter (HOSPITAL_COMMUNITY): Payer: Self-pay | Admitting: Orthopedic Surgery

## 2015-03-15 LAB — CBC
HCT: 34.6 % — ABNORMAL LOW (ref 36.0–46.0)
Hemoglobin: 11.4 g/dL — ABNORMAL LOW (ref 12.0–15.0)
MCH: 29 pg (ref 26.0–34.0)
MCHC: 32.9 g/dL (ref 30.0–36.0)
MCV: 88 fL (ref 78.0–100.0)
Platelets: 236 10*3/uL (ref 150–400)
RBC: 3.93 MIL/uL (ref 3.87–5.11)
RDW: 13.7 % (ref 11.5–15.5)
WBC: 12.6 10*3/uL — ABNORMAL HIGH (ref 4.0–10.5)

## 2015-03-15 LAB — BASIC METABOLIC PANEL
Anion gap: 7 (ref 5–15)
BUN: 14 mg/dL (ref 6–20)
CO2: 27 mmol/L (ref 22–32)
Calcium: 8.5 mg/dL — ABNORMAL LOW (ref 8.9–10.3)
Chloride: 99 mmol/L — ABNORMAL LOW (ref 101–111)
Creatinine, Ser: 1.04 mg/dL — ABNORMAL HIGH (ref 0.44–1.00)
GFR calc Af Amer: 56 mL/min — ABNORMAL LOW (ref >60)
GFR calc non Af Amer: 49 mL/min — ABNORMAL LOW (ref >60)
Glucose, Bld: 173 mg/dL — ABNORMAL HIGH (ref 65–99)
Potassium: 5 mmol/L (ref 3.5–5.1)
Sodium: 133 mmol/L — ABNORMAL LOW (ref 135–145)

## 2015-03-15 MED ORDER — KETOROLAC TROMETHAMINE 15 MG/ML IJ SOLN
15.0000 mg | Freq: Once | INTRAMUSCULAR | Status: AC
  Administered 2015-03-15: 15 mg via INTRAVENOUS
  Filled 2015-03-15: qty 1

## 2015-03-15 NOTE — Clinical Social Work Note (Signed)
Clinical Social Work Assessment  Patient Details  Name: Samantha Stone MRN: [id number] Date of Birth: 1931/04/06  Date of referral:  03/15/15               Reason for consult:  Facility Placement, Discharge Planning                Permission sought to share information with:  Family Supports, Customer service manager Permission granted to share information::  Yes, Verbal Permission Granted  Name::     Samantha Stone  Agency::  [facility]  Relationship::  Daughter  Contact Information:  [phone]  Housing/Transportation Living arrangements for the past 2 months:  [place] of Information:  Patient, Adult Children Patient Interpreter Needed:  None Criminal Activity/Legal Involvement Pertinent to Current Situation/Hospitalization:  No - Comment as needed Significant Relationships:  Adult Children, Pets Lives with:  Self, Pets Do you feel safe going back to the place where you live?  No (High fall risk.) Need for family participation in patient care:  Yes (Comment) (Patient's daughter active in patient's care.)  Care giving concerns:  Patient nor patient daughter expressed concerns at this time.   Social Worker assessment / plan:  CSW received referral for possible SNF placement at time of discharge. CSW met with patient and patient's daughter at bedside to discuss discharge disposition. Per patient, patient has pre-registered with [facility] SNF for placement at time of discharge. CSW to continue to follow and assist with discharge planning needs.  Employment status:  Unemployed Forensic scientist:  Programmer, applications (Marine scientist) PT Recommendations:  [place] / Referral to community resources:  [place]  Patient/Family's Response to care:  Patient and patient's daughter understanding and agreeable to CSW plan of care.  Patient/Family's Understanding of and Emotional Response to Diagnosis,  Current Treatment, and Prognosis:  Patient and patient's daughter understanding and agreeable to CSW plan of care.  Emotional Assessment Appearance:  Appears stated age Attitude/Demeanor/Rapport:  Other (Pleasant.) Affect (typically observed):  Pleasant, Accepting, Appropriate Orientation:  Oriented to Self, Oriented to Place, Oriented to  Time, Oriented to Situation Alcohol / Substance use:  Not Applicable Psych involvement (Current and /or in the community):  No (Comment) (Not appropriate on this admission.)  Discharge Needs  Concerns to be addressed:  No discharge needs identified Readmission within the last 30 days:  No Current discharge risk:  None Barriers to Discharge:  No Barriers Identified   Caroline Sauger, LCSW 03/15/2015, 3:06 PM [phone]

## 2015-03-15 NOTE — Op Note (Signed)
[name], [name]             ACCOUNT NO.:  [id number]  MEDICAL RECORD NO.:  [id number]  LOCATION:  [id number]                        FACILITY:  [place]  PHYSICIAN:  Alta Corning, M.D.   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  PRINCIPAL PROCEDURES: 1. Total knee revision of entire tibial side. 2. Open reduction and internal fixation of proximal tibia with     FiberWire and a 4.5 fully-threaded cortical screw.  SURGEON:  Alta Corning, M.D.  ASSISTANT:  Gary Fleet, P.A.  ANESTHESIA:  General.  BRIEF HISTORY:  [name] is an 81 year old female with long history of significant complaints of right knee pain.  This has been going for about 6 months to a year.  X-ray showed obvious loose tibia.  We did a bone scan just to make sure that the tibia was loose, the rest of it looked good.  She was taken to the operating room for revision.  We felt given that the femur performed so well that there was no need to revise the femur and lets it felt like a needed it and at this point, we were going to make that decision intraoperatively, she was brought to the operating room for this procedure.  DESCRIPTION OF PROCEDURE:  The patient was taken to the operating room. After adequate level of anesthesia was obtained with general anesthetic, the patient was placed supine on the operating table.  The right leg was then prepped and draped in usual sterile fashion.  Once this was completed, the leg was exsanguinated.  Blood pressure tourniquet was inflated to 350 mmHg.  Following this, a midline incision was made in the subcutaneous tissue down the level of extensor  mechanism and a medial parapatellar arthrotomy was undertaken.  Once this was done, a big synovectomy was performed and attention was turned towards the mobile bearings, which were removed.  Medial and lateral mobile bearings were removed.  At this point, attention was turned towards the tibia where a clean-up cut was made with 2-degree posterior slope and this really gave [place] very nice bone on the tibial side and at this point, we looked there was pretty good size gap posteromedially.  At that point, we felt that taking a little bit anterior bone and using a 5-mm wedge made more sense than cutting more bone.  So, that point, that was opened and chosen to be used.  We then sized it to a 2.5.  We drilled it, we keeled it and used a 75-mm stem, which we get some purchase down into the tibia to resist bending and then cemented the proximal portion of the tibia and to this area, this was our plan.  A trial was placed with rotation held by a keel and an excellent range of motion and stability were achieved.  She had 15-mm bearings in.  We did about 5 extra mm of bone, 17.5 looked not  bad, 20 I thought felt better and that made sense relative to where she was preoperatively.  So, at this point, we trialed the size 2.5 tibia with a 75-mm stem at the distal portion, which was press-fit, proximal portion which was cemented was cementable and excellent range of motion and stability were achieved.  At this time, we opened these components and felt little bit on the back table.  To gain the exposure for the case, we needed do a proximal tibial osteotomy and this was done by using a saw and cutting the tibial tubercle and then ultimately taken some osteotomes and opening this up.  Once this was opened completely, was allowed [place] to have access to do the case we just outlined.  Once that was done, we put sutures with fixation through the posteromedial tibia, outside of where, we would be ultimately  working. We put two FiberWire sutures there.  We also then felt that we would put a screw in this when we finished.  So, at this point, we irrigated thoroughly.  We cemented in the 2.5 MBT revision with a 75-mm press-fit distal stem, size 12 and this gave [place] very nice fixation up proximally in the femur.  Once this was completed, this was hammered into place and a 20 trial poly was placed.  I was very satisfied with that.  So, we opened, we got all excess bone cement removed and then we opened the final 20 poly and placed it.  This allowed [place] to turn towards the tubercle osteotomy to do an ORIF of the proximal tibia and this was done with a 4-mm cortical screw, which was placed with the compressive technique from anterolateral to posteromedial missing the tibial stem. Once that was completed, attention was turned towards tying the #2 FiberWires which gave [place] excellent stability of the tibial tubercle osteotomy.  At this point, the wound was copiously and thoroughly irrigated, suctioned dry.  Medium Hemovac drain was placed.  The medial parapatellar arthrotomy was closed with 1 Vicryl running, the skin with 0 and 2-0 Vicryl and 3-0 subcuticular.  Benzoin and Steri-Strips were applied.  Sterile compressive dressing was applied.  The patient was taken to the recovery room, she was noted to be in satisfactory condition.  Estimated blood loss for the procedure was minimal.     Alta Corning, M.D.     Corliss Skains  D:  03/14/2015  T:  03/15/2015  Job:  [id number]

## 2015-03-15 NOTE — Progress Notes (Signed)
Utilization review completed.  

## 2015-03-15 NOTE — Progress Notes (Signed)
Subjective: 1 Day Post-Op Procedure(s) (LRB): TIBIAL REVISION, OPEN REDUCTION INTERNAL FIXATION PROXIMAL TIBIA (Right) Patient reports pain as moderate. Increased knee pain this morning. Not out of bed yet.  Objective: Vital signs in last 24 hours: Temp:  [97.3 F (36.3 C)-98.5 F (36.9 C)] 98.2 F (36.8 C) (08/16 0704) Pulse Rate:  [45-64] 58 (08/16 0704) Resp:  [8-18] 18 (08/16 0704) BP: (126-174)/(50-108) 130/58 mmHg (08/16 0704) SpO2:  [92 %-100 %] 96 % (08/16 0704) Weight:  [119.3 kg (263 lb 0.1 oz)] 119.3 kg (263 lb 0.1 oz) (08/15 1209)  Intake/Output from previous day: 08/15 0701 - 08/16 0700 In: 1383.3 [P.O.:220; I.V.:1163.3] Out: 1060 [Urine:750; Drains:110; Blood:200] Intake/Output this shift: Total I/O In: 7793 [P.O.:450; I.V.:1290] Out: 650 [Urine:350; Drains:300]   Recent Labs  03/15/15 0610  HGB 11.4*    Recent Labs  03/15/15 0610  WBC 12.6*  RBC 3.93  HCT 34.6*  PLT 236    Recent Labs  03/15/15 0610  NA 133*  K 5.0  CL 99*  CO2 27  BUN 14  CREATININE 1.04*  GLUCOSE 173*  CALCIUM 8.5*   No results for input(s): LABPT, INR in the last 72 hours. Right knee exam: Neurovascular intact Sensation intact distally Intact pulses distally Dorsiflexion/Plantar flexion intact Incision: dressing C/D/I Compartment soft  Assessment/Plan: 1 Day Post-Op Procedure(s) (LRB): TIBIAL REVISION, OPEN REDUCTION INTERNAL FIXATION PROXIMAL TIBIA (Right)  Plan: Ambulate weightbearing  As tolerated on the right with a knee immobilizer.  Limit flexion of the knee to 45. Continue CPM at 0 to 45 only. Aspirin 325 mg twice daily with SCDs for DVT prophylaxis. Hemovac drain pulled Up with therapy Discharge to SNFin a few days.  Shelanda Duvall G 03/15/2015, 9:12 AM

## 2015-03-15 NOTE — Transfer of Care (Addendum)
Immediate Anesthesia Transfer of Care Note  Patient: Samantha Stone  Procedure(s) Performed: Procedure(s): TIBIAL REVISION, OPEN REDUCTION INTERNAL FIXATION PROXIMAL TIBIA (Right)  Patient Location: PACU  Anesthesia Type:MAC  Level of Consciousness: awake  Airway & Oxygen Therapy: Patient Spontanous Breathing and Patient connected to nasal cannula oxygen  Post-op Assessment: Report given to RN and Post -op Vital signs reviewed and stable  Post vital signs: Reviewed and stable  Last Vitals:  Filed Vitals:   03/15/15 0704  BP: 130/58  Pulse: 58  Temp: 36.8 C  Resp: 18    Complications: No apparent anesthesia complications

## 2015-03-15 NOTE — Clinical Social Work Placement (Signed)
   CLINICAL SOCIAL WORK PLACEMENT  NOTE  Date:  03/15/2015  Patient Details  Name: MONCERRATH BERHE MRN: [id number] Date of Birth: November 16, 1930  Clinical Social Work is seeking post-discharge placement for this patient at the [place] level of care (*CSW will initial, date and re-position this form in  chart as items are completed):  Yes   Patient/family provided with [place] Work Department's list of facilities offering this level of care within the geographic area requested by the patient (or if unable, by the patient's family).  Yes   Patient/family informed of their freedom to choose among providers that offer the needed level of care, that participate in Medicare, Medicaid or managed care program needed by the patient, have an available bed and are willing to accept the patient.  Yes   Patient/family informed of [place]'s ownership interest in [facility] and [facility], as well as of the fact that they are under no obligation to receive care at these facilities.  PASRR submitted to EDS on 03/15/15     PASRR number received on 03/15/15     Existing PASRR number confirmed on  (n/a)     FL2 transmitted to all facilities in geographic area requested by pt/family on 03/15/15     FL2 transmitted to all facilities within larger geographic area on  (n/a)     Patient informed that his/her managed care company has contracts with or will negotiate with certain facilities, including the following:   (yes, [place].)     Yes   Patient/family informed of bed offers received.  Patient chooses bed at [facility]     Physician recommends and patient chooses bed at  (n/a)    Patient to be transferred to [facility] on  .  Patient to be transferred to facility by       Patient family notified on   of transfer.  Name of family member notified:        PHYSICIAN Please sign FL2     Additional Comment:     _______________________________________________ Caroline Sauger, LCSW 03/15/2015, 3:12 PM [phone]

## 2015-03-15 NOTE — Evaluation (Signed)
Physical Therapy Evaluation Patient Details Name: Samantha Stone MRN: [id number] DOB: 11-02-1930 Today's Date: 03/15/2015   History of Present Illness  Rt Total Knee revision/ORIF proximal tibia  Clinical Impression  Pt is s/p TKA resulting in the deficits listed below (see PT Problem List).  Pt will benefit from skilled PT to increase their independence and safety with mobility to allow discharge to SNF. Patient reports living alone and will need further rehabilitation to increase her independence before returning home.      Follow Up Recommendations SNF    Equipment Recommendations  None recommended by PT    Recommendations for Other Services       Precautions / Restrictions Precautions Precautions: Knee Precaution Booklet Issued: No Precaution Comments: No Knee flexion over 45 degrees Required Braces or Orthoses: Knee Immobilizer - Right Knee Immobilizer - Right: On when out of bed or walking Restrictions Weight Bearing Restrictions: Yes RLE Weight Bearing: Weight bearing as tolerated      Mobility  Bed Mobility Overal bed mobility: Needs Assistance Bed Mobility: Supine to Sit     Supine to sit: Min assist     General bed mobility comments: assist with RLE  Transfers Overall transfer level: Needs assistance Equipment used: Rolling walker (2 wheeled) Transfers: Sit to/from Stand Sit to Stand: Min assist         General transfer comment: cues for hand placement   Ambulation/Gait Ambulation/Gait assistance: Min assist Ambulation Distance (Feet): 5 Feet Assistive device: Rolling walker (2 wheeled) Gait Pattern/deviations: Step-to pattern;Decreased step length - right;Decreased stance time - right;Decreased weight shift to right Gait velocity: decreased   General Gait Details: cues for gait sequence  Stairs            Wheelchair Mobility    Modified Rankin (Stroke Patients Only)       Balance Overall balance assessment: Needs  assistance Sitting-balance support: No upper extremity supported Sitting balance-Leahy Scale: Fair     Standing balance support: Bilateral upper extremity supported Standing balance-Leahy Scale: Poor                               Pertinent Vitals/Pain Pain Assessment: 0-10 Pain Score: 7  Pain Location: Rt knee Pain Descriptors / Indicators: Aching Pain Intervention(s): Monitored during session    Home Living Family/patient expects to be discharged to:: Skilled nursing facility Living Arrangements: Alone               Additional Comments: Home has 1 step to enter. No equipment at home.    Prior Function Level of Independence: Independent               Hand Dominance        Extremity/Trunk Assessment               Lower Extremity Assessment: RLE deficits/detail RLE Deficits / Details: poor quad activation       Communication   Communication: No difficulties  Cognition Arousal/Alertness: Awake/alert Behavior During Therapy: WFL for tasks assessed/performed Overall Cognitive Status: Within Functional Limits for tasks assessed                      General Comments      Exercises        Assessment/Plan    PT Assessment Patient needs continued PT services  PT Diagnosis Difficulty walking   PT Problem List Decreased strength;Decreased range of motion;Decreased activity tolerance;Decreased balance;Decreased  mobility;Decreased knowledge of use of DME  PT Treatment Interventions DME instruction;Gait training;Functional mobility training;Stair training;Therapeutic activities;Therapeutic exercise;Balance training;Patient/family education   PT Goals (Current goals can be found in the Care Plan section) Acute Rehab PT Goals Patient Stated Goal: eventually get back home PT Goal Formulation: With patient Time For Goal Achievement: 03/29/15 Potential to Achieve Goals: Good    Frequency 7X/week   Barriers to discharge         Co-evaluation               End of Session Equipment Utilized During Treatment: Gait belt;Right knee immobilizer Activity Tolerance: Patient tolerated treatment well Patient left: in chair;with call bell/phone within reach;with chair alarm set Nurse Communication: Mobility status;Precautions;Weight bearing status         Time: 1022-1105 PT Time Calculation (min) (ACUTE ONLY): 43 min   Charges:   PT Evaluation $Initial PT Evaluation Tier I: 1 Procedure PT Treatments $Gait Training: 8-22 mins $Therapeutic Activity: 8-22 mins   PT G Codes:        Cassell Clement, PT, CSCS Pager [phone] Office [phone]  03/15/2015, 11:18 AM

## 2015-03-15 NOTE — Progress Notes (Signed)
Physical Therapy Treatment Patient Details Name: Samantha Stone MRN: [id number] DOB: 06/28/1931 Today's Date: 03/15/2015    History of Present Illness Rt Total Knee revision/ORIF proximal tibia    PT Comments    Patient able to make progress with ambulation this afternoon. Requires a little increase time for mobility but moving nicely. Patient is planning to [facility] when medically stable. Until then will continue with current POC  Follow Up Recommendations  SNF     Equipment Recommendations  None recommended by PT    Recommendations for Other Services       Precautions / Restrictions Precautions Precautions: Knee Precaution Booklet Issued: No Precaution Comments: No Knee flexion over 45 degrees Required Braces or Orthoses: Knee Immobilizer - Right Knee Immobilizer - Right: On when out of bed or walking Restrictions Weight Bearing Restrictions: Yes RLE Weight Bearing: Weight bearing as tolerated    Mobility  Bed Mobility Overal bed mobility: Needs Assistance Bed Mobility: Sit to Supine     Supine to sit: Min assist Sit to supine: Min assist   General bed mobility comments: assist with RLE. Cues for positioning prior to sitting   Transfers Overall transfer level: Needs assistance Equipment used: Rolling walker (2 wheeled) Transfers: Sit to/from Stand Sit to Stand: Min assist         General transfer comment: cues for hand placement. A to ensure balance with stand  Ambulation/Gait Ambulation/Gait assistance: Min guard Ambulation Distance (Feet): 60 Feet Assistive device: Rolling walker (2 wheeled) Gait Pattern/deviations: Step-to pattern;Decreased stance time - right;Decreased step length - left Gait velocity: decreased Gait velocity interpretation: Below normal speed for age/gender General Gait Details: cues for gait sequence and proximity of RW.    Stairs            Wheelchair Mobility    Modified Rankin (Stroke Patients Only)        Balance Overall balance assessment: Needs assistance Sitting-balance support: No upper extremity supported Sitting balance-Leahy Scale: Fair     Standing balance support: Bilateral upper extremity supported Standing balance-Leahy Scale: Poor                      Cognition Arousal/Alertness: Awake/alert Behavior During Therapy: WFL for tasks assessed/performed Overall Cognitive Status: Within Functional Limits for tasks assessed                      Exercises      General Comments        Pertinent Vitals/Pain Pain Assessment: No/denies pain Pain Score: 7  Pain Location: Rt knee Pain Descriptors / Indicators: Aching Pain Intervention(s): Monitored during session    Home Living Family/patient expects to be discharged to:: Skilled nursing facility Living Arrangements: Alone             Additional Comments: Home has 1 step to enter. No equipment at home.    Prior Function Level of Independence: Independent          PT Goals (current goals can now be found in the care plan section) Acute Rehab PT Goals Patient Stated Goal: eventually get back home PT Goal Formulation: With patient Time For Goal Achievement: 03/29/15 Potential to Achieve Goals: Good Progress towards PT goals: Progressing toward goals    Frequency  7X/week    PT Plan Current plan remains appropriate    Co-evaluation             End of Session Equipment Utilized During Treatment:  Gait belt;Right knee immobilizer Activity Tolerance: Patient tolerated treatment well Patient left: with call bell/phone within reach;in bed;with family/visitor present     Time: 9629-5284 PT Time Calculation (min) (ACUTE ONLY): 29 min  Charges:  $Gait Training: 8-22 mins $Therapeutic Exercise: 8-22 mins $Therapeutic Activity: 8-22 mins                    G Codes:      Jacqualyn Posey 03/15/2015, 2:49 PM 03/15/2015 Jacqualyn Posey PTA [phone]  pager [phone] office

## 2015-03-16 LAB — CBC
HCT: 29.5 % — ABNORMAL LOW (ref 36.0–46.0)
Hemoglobin: 9.7 g/dL — ABNORMAL LOW (ref 12.0–15.0)
MCH: 28.7 pg (ref 26.0–34.0)
MCHC: 32.9 g/dL (ref 30.0–36.0)
MCV: 87.3 fL (ref 78.0–100.0)
Platelets: 244 10*3/uL (ref 150–400)
RBC: 3.38 MIL/uL — ABNORMAL LOW (ref 3.87–5.11)
RDW: 14 % (ref 11.5–15.5)
WBC: 15.5 10*3/uL — ABNORMAL HIGH (ref 4.0–10.5)

## 2015-03-16 NOTE — Progress Notes (Signed)
Physical Therapy Treatment Patient Details Name: Samantha Stone MRN: [id number] DOB: 1931-01-05 Today's Date: 03/16/2015    History of Present Illness Rt Total Knee revision/ORIF proximal tibia    PT Comments    Patient continues to be motivated however limited today due to pain. Will work on long ambulation later again today.   Follow Up Recommendations  SNF     Equipment Recommendations  None recommended by PT    Recommendations for Other Services       Precautions / Restrictions Precautions Precautions: Knee Precaution Comments: No Knee flexion over 45 degrees Required Braces or Orthoses: Knee Immobilizer - Right Knee Immobilizer - Right: On when out of bed or walking Restrictions Weight Bearing Restrictions: Yes RLE Weight Bearing: Weight bearing as tolerated    Mobility  Bed Mobility               General bed mobility comments: patient up in recliner before and after session  Transfers Overall transfer level: Needs assistance Equipment used: Rolling walker (2 wheeled)   Sit to Stand: Min guard         General transfer comment: cues for hand placement.   Ambulation/Gait Ambulation/Gait assistance: Min guard Ambulation Distance (Feet): 80 Feet Assistive device: Rolling walker (2 wheeled) Gait Pattern/deviations: Step-to pattern;Decreased stance time - right;Decreased step length - left;Trunk flexed Gait velocity: decreased   General Gait Details: Cues for posture and proximity of RW. limited due to increasing pain   Stairs            Wheelchair Mobility    Modified Rankin (Stroke Patients Only)       Balance                                    Cognition Arousal/Alertness: Awake/alert Behavior During Therapy: WFL for tasks assessed/performed Overall Cognitive Status: Within Functional Limits for tasks assessed                      Exercises Total Joint Exercises Quad Sets: AROM;Right;10 reps Heel  Slides: AAROM;Right;10 reps;Limitations Heel Slides Limitations: to 45 degrees per MD order Hip ABduction/ADduction: AAROM;Right;10 reps Straight Leg Raises: AAROM;Right;10 reps    General Comments        Pertinent Vitals/Pain Pain Score: 8  Pain Location: R knee Pain Descriptors / Indicators: Aching;Sore Pain Intervention(s): Monitored during session;Premedicated before session    Home Living                      Prior Function            PT Goals (current goals can now be found in the care plan section) Progress towards PT goals: Progressing toward goals    Frequency  7X/week    PT Plan Current plan remains appropriate    Co-evaluation             End of Session Equipment Utilized During Treatment: Gait belt;Right knee immobilizer Activity Tolerance: Patient tolerated treatment well Patient left: with call bell/phone within reach;in bed     Time: 0923-0952 PT Time Calculation (min) (ACUTE ONLY): 29 min  Charges:  $Gait Training: 8-22 mins $Therapeutic Exercise: 8-22 mins                    G Codes:      Jacqualyn Posey 03/16/2015, 11:13 AM  03/16/2015 [place], Tonia Brooms PTA  [id number] pager [phone] office

## 2015-03-16 NOTE — Progress Notes (Addendum)
Physical Therapy Treatment Patient Details Name: Samantha Stone MRN: [id number] DOB: [birth year] Today's Date: 03/16/2015    History of Present Illness Rt Total Knee revision/ORIF proximal tibia    PT Comments    Upon entering room, patient had just gotten back into bed with assistance from the NT. Patient stated that she was "worn out" from first session and could not walk again this afternoon. Patient was agreeable to work on progressing therex while lying in bed. Able to increase reps on therex. Continue with current POC  Follow Up Recommendations  SNF     Equipment Recommendations  None recommended by PT    Recommendations for Other Services       Precautions / Restrictions Precautions Precautions: Knee Precaution Comments: No Knee flexion over 45 degrees Required Braces or Orthoses: Knee Immobilizer - Right Knee Immobilizer - Right: On when out of bed or walking Restrictions RLE Weight Bearing: Weight bearing as tolerated    Mobility  Bed Mobility               Stairs            Wheelchair Mobility    Modified Rankin (Stroke Patients Only)       Balance                                    Cognition Arousal/Alertness: Awake/alert Behavior During Therapy: WFL for tasks assessed/performed Overall Cognitive Status: Within Functional Limits for tasks assessed                      Exercises Total Joint Exercises Quad Sets: AROM;Right;15 reps Short Arc QuadSinclair Ship;Right;15 reps Heel Slides: AAROM;Right;Limitations;15 reps Heel Slides Limitations: to 45 degrees per MD order Hip ABduction/ADduction: AAROM;Right;15 reps Straight Leg Raises: AAROM;Right;15 reps    General Comments        Pertinent Vitals/Pain Pain Score: 8  Pain Location: R knee Pain Descriptors / Indicators: Sore Pain Intervention(s): Repositioned;Limited activity within patient's tolerance    Home Living                      Prior  Function            PT Goals (current goals can now be found in the care plan section) Progress towards PT goals: Progressing toward goals    Frequency  7X/week    PT Plan Current plan remains appropriate    Co-evaluation             End of Session Equipment Utilized During Treatment: Gait belt;Right knee immobilizer Activity Tolerance: Patient tolerated treatment well Patient left: with call bell/phone within reach;in bed     Time: 1332-1350 PT Time Calculation (min) (ACUTE ONLY): 18 min  Charges:  $Gait Training: 8-22 mins $Therapeutic Exercise: 8-22 mins                    G Codes:      Jacqualyn Posey 03/16/2015, 1:57 PM 03/16/2015 Jacqualyn Posey PTA [phone] pager [phone] office

## 2015-03-16 NOTE — Clinical Social Work Note (Signed)
Disposition: [facility] - bed ready Projected discharge: Thursday 03/17/2015  Nonnie Done, LCSW [phone]  Psychiatric & Orthopedics (5N 1-8) Clinical Social Worker

## 2015-03-16 NOTE — Progress Notes (Signed)
Subjective: 2 Days Post-Op Procedure(s) (LRB): TIBIAL REVISION, OPEN REDUCTION INTERNAL FIXATION PROXIMAL TIBIA (Right) Patient reports pain as moderate.  Taking by mouth, voiding okay. Positive flatus. Improving with physical therapy. Complains of right knee pain when up.  Objective: Vital signs in last 24 hours: Temp:  [98.1 F (36.7 C)-99 F (37.2 C)] 98.2 F (36.8 C) (08/17 0520) Pulse Rate:  [66-69] 69 (08/17 0520) Resp:  [16-18] 16 (08/17 0520) BP: (122-133)/(41-62) 133/62 mmHg (08/17 0520) SpO2:  [95 %-96 %] 95 % (08/17 0520)  Intake/Output from previous day: 08/16 0701 - 08/17 0700 In: 1980 [P.O.:690; I.V.:1290] Out: 650 [Urine:350; Drains:300] Intake/Output this shift: Total I/O In: 120 [P.O.:120] Out: -    Recent Labs  03/15/15 0610 03/16/15 0618  HGB 11.4* 9.7*    Recent Labs  03/15/15 0610 03/16/15 0618  WBC 12.6* 15.5*  RBC 3.93 3.38*  HCT 34.6* 29.5*  PLT 236 244    Recent Labs  03/15/15 0610  NA 133*  K 5.0  CL 99*  CO2 27  BUN 14  CREATININE 1.04*  GLUCOSE 173*  CALCIUM 8.5*   No results for input(s): LABPT, INR in the last 72 hours. Right knee exam: Neurovascular intact Sensation intact distally Intact pulses distally Dorsiflexion/Plantar flexion intact Incision: dressing C/D/I Compartment soft  Assessment/Plan: 2 Days Post-Op Procedure(s) (LRB): TIBIAL REVISION, OPEN REDUCTION INTERNAL FIXATION PROXIMAL TIBIA (Right) Plan: Continue aspirin 325 mg twice daily with SCDs for DVT prophylaxis. Weight-bear as tolerated on right. Limit range of motion to 0 to 45. Knee immobilizer when ambulating. Up with therapy Discharge to SNF tomorrow.  [place] G 03/16/2015, 10:07 AM

## 2015-03-17 DIAGNOSIS — D62 Acute posthemorrhagic anemia: Secondary | ICD-10-CM

## 2015-03-17 LAB — CBC
HCT: 26.3 % — ABNORMAL LOW (ref 36.0–46.0)
Hemoglobin: 8.7 g/dL — ABNORMAL LOW (ref 12.0–15.0)
MCH: 29.5 pg (ref 26.0–34.0)
MCHC: 33.1 g/dL (ref 30.0–36.0)
MCV: 89.2 fL (ref 78.0–100.0)
Platelets: 216 10*3/uL (ref 150–400)
RBC: 2.95 MIL/uL — ABNORMAL LOW (ref 3.87–5.11)
RDW: 14.3 % (ref 11.5–15.5)
WBC: 9.9 10*3/uL (ref 4.0–10.5)

## 2015-03-17 NOTE — Discharge Planning (Signed)
Patient will discharge today per MD order. Patient will discharge to: [facility] SNF RN to call report prior to transportation to: [phone] Transportation: [facility]- scheduled at 10:30  CSW sent discharge summary to SNF for review.  Packet is complete.  RN, patient and family aware of discharge plans.  Nonnie Done, [place] [phone]  Psychiatric & Orthopedics (5N 1-16) Clinical Social Worker

## 2015-03-17 NOTE — Progress Notes (Signed)
Subjective: 3 Days Post-Op Procedure(s) (LRB): TIBIAL REVISION, OPEN REDUCTION INTERNAL FIXATION PROXIMAL TIBIA (Right) Patient reports pain as moderate.  Taking by mouth and voiding okay. Good progress with physical therapy. No dizziness.  Objective: Vital signs in last 24 hours: Temp:  [97.9 F (36.6 C)-98.2 F (36.8 C)] 98 F (36.7 C) (08/18 0548) Pulse Rate:  [58-63] 58 (08/18 0548) Resp:  [17-18] 18 (08/18 0548) BP: (122-136)/(47-71) 126/49 mmHg (08/18 0548) SpO2:  [94 %-97 %] 96 % (08/18 0548)  Intake/Output from previous day: 08/17 0701 - 08/18 0700 In: 400 [P.O.:400] Out: -  Intake/Output this shift:     Recent Labs  03/15/15 0610 03/16/15 0618  HGB 11.4* 9.7*    Recent Labs  03/15/15 0610 03/16/15 0618  WBC 12.6* 15.5*  RBC 3.93 3.38*  HCT 34.6* 29.5*  PLT 236 244    Recent Labs  03/15/15 0610  NA 133*  K 5.0  CL 99*  CO2 27  BUN 14  CREATININE 1.04*  GLUCOSE 173*  CALCIUM 8.5*   No results for input(s): LABPT, INR in the last 72 hours. Right knee exam: Neurovascular intact Sensation intact distally Intact pulses distally Dorsiflexion/Plantar flexion intact Incision: dressing C/D/I Compartment soft  Assessment/Plan: 3 Days Post-Op Procedure(s) (LRB): TIBIAL REVISION, OPEN REDUCTION INTERNAL FIXATION PROXIMAL TIBIA (Right) Up with therapy Discharge to SNF Follow-up with Dr. Berenice Primas in 2 weeks.  [place] G 03/17/2015, 8:47 AM

## 2015-03-17 NOTE — Discharge Summary (Signed)
Patient ID: Samantha Stone MRN: [id number] DOB/AGE: [birth year] 79 y.o.  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 Hypertension   . Hypothyroidism   . Arthritis   . Concussion   . Headache   . Depression   . Stress incontinence   . Cancer     basal cell skin cancer    Surgeries: Procedure(s): Right total knee revision. TIBIAL REVISION, OPEN REDUCTION INTERNAL FIXATION PROXIMAL TIBIA on 03/14/2015 Discharged Condition: Improved  Hospital Course: RHETTA CLEEK is an 81 y.o. female who was admitted 03/14/2015 for operative treatment ofAseptic loosening of prosthetic knee. Patient has severe unremitting pain that affects sleep, daily activities, and work/hobbies. After pre-op clearance the patient was taken to the operating room on 03/14/2015 and underwent  Procedure(s): Right total knee TIBIAL REVISION, OPEN REDUCTION INTERNAL FIXATION PROXIMAL TIBIA.    Patient was given perioperative antibiotics: Anti-infectives    Start     Dose/Rate Route Frequency Ordered Stop   03/14/15 2100  ceFAZolin (ANCEF) IVPB 2 g/50 mL premix     2 g 100 mL/hr over 30 Minutes Intravenous Every 6 hours 03/14/15 2059 03/15/15 0320   03/14/15 1600  cefUROXime (ZINACEF) injection  Status:  Discontinued       As needed 03/14/15 1601 03/14/15 1731   03/14/15 1330  ceFAZolin (ANCEF) IVPB 2 g/50 mL premix     2 g 100 mL/hr over 30 Minutes Intravenous To [facility] 03/11/15 1327 03/14/15 1435       Patient was given sequential compression devices, early ambulation, and chemoprophylaxis to prevent DVT. Range of motion was limited to 0 to 45. She was able to weight-bear as tolerated on the right. She wore a  knee immobilizer while out of bed. The patient did well. She made good progress with physical therapy. Patient benefited maximally from hospital stay and there were no complications.    Recent vital signs: Patient Vitals for the past 24 hrs:  BP Temp Temp src Pulse Resp SpO2  03/17/15 0548 (!) 126/49 mmHg 98 F (36.7 C) Oral (!) 58 18 96 %  03/16/15 1958 122/71 mmHg 97.9 F (36.6 C) Oral (!) 58 17 94 %  03/16/15 1300 (!) 136/47 mmHg 98.2 F (36.8 C) Oral 63 18 97 %  03/16/15 1152 133/62 mmHg - - - - -     Recent laboratory studies:  Recent Labs  03/15/15 0610 03/16/15 0618  WBC 12.6* 15.5*  HGB 11.4* 9.7*  HCT 34.6* 29.5*  PLT 236 244  NA 133*  --   K 5.0  --   CL 99*  --   CO2 27  --   BUN 14  --   CREATININE 1.04*  --   GLUCOSE 173*  --   CALCIUM 8.5*  --      Discharge Medications:     Medication List    STOP taking these medications        aspirin 81 MG tablet  Replaced by:  aspirin EC 325 MG tablet     naproxen sodium 220 MG tablet  Commonly known as:  ANAPROX      TAKE these medications        aspirin EC 325 MG tablet  Take  1 tablet (325 mg total) by mouth 2 (two) times daily after a meal. Take x 1 month post op to decrease risk of blood clots.     atenolol 50 MG tablet  Commonly known as:  TENORMIN  Take 100 mg by mouth every morning.     BIOTIN PO  Take 1 tablet by mouth daily.     CALCIUM-VITAMIN D PO  Take 1 tablet by mouth daily.     citalopram 20 MG tablet  Commonly known as:  CELEXA  Take 20 mg by mouth daily.     ferrous sulfate 325 (65 FE) MG tablet  Take 325 mg by mouth daily with breakfast.     FISH OIL PO  Take 1 capsule by mouth daily.     levothyroxine 125 MCG tablet  Commonly known as:  SYNTHROID, LEVOTHROID  Take 125 mcg by mouth daily.     losartan 50 MG tablet  Commonly known as:  COZAAR  Take 50 mg by mouth daily.     oxyCODONE-acetaminophen 5-325 MG per tablet  Commonly known as:  PERCOCET/ROXICET  Take 1-2  tablets by mouth every 4 (four) hours as needed for severe pain.     pravastatin 10 MG tablet  Commonly known as:  PRAVACHOL  Take 10 tablets by mouth daily.     traZODone 50 MG tablet  Commonly known as:  DESYREL  Take 50 mg by mouth at bedtime.     VITAMIN C PO  Take 1 capsule by mouth daily.        Diagnostic Studies: Dg Chest 2 View  03/08/2015   CLINICAL DATA:  Preoperative respiratory examination prior to right knee surgery.  EXAM: CHEST  2 VIEW  COMPARISON:  10/23/2013  FINDINGS: Cardiomegaly identified.  There is no evidence of focal airspace disease, pulmonary edema, suspicious pulmonary nodule/mass, pleural effusion, or pneumothorax. No acute bony abnormalities are identified.  IMPRESSION: Cardiomegaly without evidence of active cardiopulmonary disease.   Electronically Signed   By: Margarette Canada M.D.   On: 03/08/2015 12:36   Nm Bone Scan 3 Phase  02/23/2015   CLINICAL DATA:  Total knee replacement 20 years prior. Right knee pain 2 years  EXAM: NUCLEAR MEDICINE 3-PHASE BONE SCAN  TECHNIQUE: Radionuclide angiographic images, immediate static blood pool images, and 3-hour delayed static images were obtained of the knees after intravenous injection of radiopharmaceutical.  RADIOPHARMACEUTICALS:  Twenty-five mCi Tc-23m MDP  COMPARISON:  None.  FINDINGS: Vascular phase: Faint increased blood flow to the right knee compared left subtly localizing to the tibial plateau seen best on the composite images.  Blood pool phase: Uptake localizing to the medial and lateral aspect of the tibial plateau of the right knee.  Delayed phase: Focal activity localizing to the medial and to a lesser extent lateral right tibial plateau corresponds to abnormal blood pool activity and subtle hyperemia.  IMPRESSION: Abnormal uptake within the RIGHT tibial plateau on all 3 phases of bone scan is concerning for mild loosening of the prosthetic.   Electronically Signed   By: Suzy Bouchard M.D.   On: 02/23/2015 14:20     Disposition: Skilled nursing facility.      Discharge Instructions    CPM    Complete by:  As directed   Continuous passive motion machine (CPM):      Use the CPM from 0 to 45 for 8 hours per day.  Do not increase range of motion past this. You may break it up into 2  or 3 sessions per day.      Use CPM for 1-2 weeks or until you are told to stop.     Call MD / Call 911    Complete by:  As directed   If you experience chest pain or shortness of breath, CALL 911 and be transported to the hospital emergency room.  If you develope a fever above 101 F, pus (white drainage) or increased drainage or redness at the wound, or calf pain, call your surgeon's office.     Constipation Prevention    Complete by:  As directed   Drink plenty of fluids.  Prune juice may be helpful.  You may use a stool softener, such as Colace (over the counter) 100 mg twice a day.  Use MiraLax (over the counter) for constipation as needed.     Diet general    Complete by:  As directed      Do not put a pillow under the knee. Place it under the heel.    Complete by:  As directed      Increase activity slowly as tolerated    Complete by:  As directed      Weight bearing as tolerated    Complete by:  As directed   Laterality:  right  Extremity:  Lower  Wear knee immobilizer at all times when ambulating. Range of motion 0 to 45 only. Do not flex past 45 until she is 4-6 weeks postop. She may wear the knee immobilizer when up in the chair or in bed. It is okay for her to remove it when she is in bed if this is more comfortable for her.           Follow-up Information    Follow up with GRAVES,JOHN L, MD. Schedule an appointment as soon as possible for a visit in 2 weeks.   Specialty:  Orthopedic Surgery   Contact information:   [place] [address] [phone]        Signed: Erlene Senters 03/17/2015, 8:52 AM

## 2015-03-17 NOTE — Progress Notes (Signed)
Physical Therapy Treatment Patient Details Name: Samantha Stone MRN: [id number] DOB: 07-24-1931 Today's Date: 03/17/2015    History of Present Illness Rt Total Knee revision/ORIF proximal tibia    PT Comments    Patient progressing well today and able to increase ambulation. Patient is planning to DC today to [facility] place for ongoing therapy prior to returning home.   Follow Up Recommendations  SNF     Equipment Recommendations  None recommended by PT    Recommendations for Other Services       Precautions / Restrictions Precautions Precautions: Knee Precaution Comments: No Knee flexion over 45 degrees Required Braces or Orthoses: Knee Immobilizer - Right Knee Immobilizer - Right: On when out of bed or walking Restrictions Weight Bearing Restrictions: Yes RLE Weight Bearing: Weight bearing as tolerated    Mobility  Bed Mobility Overal bed mobility: Needs Assistance       Supine to sit: Min guard        Transfers Overall transfer level: Needs assistance Equipment used: Rolling walker (2 wheeled)   Sit to Stand: Supervision         General transfer comment: cues for hand placement.   Ambulation/Gait Ambulation/Gait assistance: Min guard Ambulation Distance (Feet): 150 Feet Assistive device: Rolling walker (2 wheeled) Gait Pattern/deviations: Step-through pattern;Decreased stride length;Trunk flexed Gait velocity: decreased   General Gait Details: Cues for posture and proximity of RW.    Stairs            Wheelchair Mobility    Modified Rankin (Stroke Patients Only)       Balance                                    Cognition Arousal/Alertness: Awake/alert Behavior During Therapy: WFL for tasks assessed/performed Overall Cognitive Status: Within Functional Limits for tasks assessed                      Exercises Total Joint Exercises Quad Sets: AROM;Right;15 reps Heel Slides: AAROM;Right;Limitations;15  reps Heel Slides Limitations: to 45 degrees per MD order Hip ABduction/ADduction: AAROM;Right;15 reps Straight Leg Raises: AAROM;Right;15 reps    General Comments        Pertinent Vitals/Pain Pain Score: 4  Pain Location: r knee Pain Descriptors / Indicators: Aching;Sore Pain Intervention(s): Monitored during session    Home Living                      Prior Function            PT Goals (current goals can now be found in the care plan section) Progress towards PT goals: Progressing toward goals    Frequency  7X/week    PT Plan Current plan remains appropriate    Co-evaluation             End of Session Equipment Utilized During Treatment: Gait belt;Right knee immobilizer Activity Tolerance: Patient tolerated treatment well Patient left: in chair     Time: 0927-0952 PT Time Calculation (min) (ACUTE ONLY): 25 min  Charges:  $Gait Training: 8-22 mins $Therapeutic Exercise: 8-22 mins                    G Codes:      Jacqualyn Posey 03/17/2015, 11:37 AM  03/17/2015 Jacqualyn Posey PTA [phone] pager [phone] office

## 2015-03-17 NOTE — Care Management Important Message (Signed)
Important Message  Patient Details  Name: Samantha Stone MRN: [id number] Date of Birth: [birth year]   Medicare Important Message Given:  Yes-second notification given    Delorse Lek 03/17/2015, 2:20 PM

## 2015-03-17 NOTE — Clinical Social Work Placement (Signed)
   CLINICAL SOCIAL WORK PLACEMENT  NOTE  Date:  03/17/2015  Patient Details  Name: Samantha Stone MRN: [id number] Date of Birth: [birth year]  Clinical Social Work is seeking post-discharge placement for this patient at the [place] level of care (*CSW will initial, date and re-position this form in  chart as items are completed):  Yes   Patient/family provided with [facility] Work Department's list of facilities offering this level of care within the geographic area requested by the patient (or if unable, by the patient's family).  Yes   Patient/family informed of their freedom to choose among providers that offer the needed level of care, that participate in Medicare, Medicaid or managed care program needed by the patient, have an available bed and are willing to accept the patient.  Yes   Patient/family informed of [facility]'s ownership interest in [facility] and [facility], as well as of the fact that they are under no obligation to receive care at these facilities.  PASRR submitted to EDS on 03/15/15     PASRR number received on 03/15/15     Existing PASRR number confirmed on  (n/a)     FL2 transmitted to all facilities in geographic area requested by pt/family on 03/15/15     FL2 transmitted to all facilities within larger geographic area on  (n/a)     Patient informed that his/her managed care company has contracts with or will negotiate with certain facilities, including the following:   (yes, [facility].)     Yes   Patient/family informed of bed offers received.  Patient chooses bed at [facility]     Physician recommends and patient chooses bed at  (n/a)    Patient to be transferred to [facility] on 03/17/15.  Patient to be transferred to facility by [facility]     Patient family notified on 03/17/15 of transfer.  Name of family member notified:  patient     PHYSICIAN Please sign FL2, Please prepare priority discharge  summary, including medications     Additional Comment:    _______________________________________________ Dulcy Fanny, LCSW 03/17/2015, 10:15 AM

## 2015-03-18 ENCOUNTER — Non-Acute Institutional Stay (SKILLED_NURSING_FACILITY): Payer: Medicare Other | Admitting: Adult Health

## 2015-03-18 ENCOUNTER — Encounter: Payer: Self-pay | Admitting: Adult Health

## 2015-03-18 DIAGNOSIS — F329 Major depressive disorder, single episode, unspecified: Secondary | ICD-10-CM

## 2015-03-18 DIAGNOSIS — D62 Acute posthemorrhagic anemia: Secondary | ICD-10-CM

## 2015-03-18 DIAGNOSIS — T84032S Mechanical loosening of internal right knee prosthetic joint, sequela: Secondary | ICD-10-CM

## 2015-03-18 DIAGNOSIS — G4700 Insomnia, unspecified: Secondary | ICD-10-CM | POA: Diagnosis not present

## 2015-03-18 DIAGNOSIS — I10 Essential (primary) hypertension: Secondary | ICD-10-CM | POA: Diagnosis not present

## 2015-03-18 DIAGNOSIS — K5900 Constipation, unspecified: Secondary | ICD-10-CM

## 2015-03-18 DIAGNOSIS — E039 Hypothyroidism, unspecified: Secondary | ICD-10-CM | POA: Diagnosis not present

## 2015-03-18 DIAGNOSIS — F32A Depression, unspecified: Secondary | ICD-10-CM

## 2015-03-18 LAB — BODY FLUID CULTURE: Culture: NO GROWTH

## 2015-03-20 LAB — ANAEROBIC CULTURE

## 2015-03-20 NOTE — Progress Notes (Signed)
Patient ID: Samantha Stone, female   DOB: 25-Jun-1931, 81 y.o.   MRN: [id number]    DATE:  03/18/15 MRN:  [id number]  BIRTHDAY: 1931-06-17  Facility:  Nursing Home Location:  [place] Room Number: 1202-P  LEVEL OF CARE:  SNF [phone])  Contact Information    Name Relation Home Work [place] Daughter   [phone]   Dutch Gray [phone]  [phone]   Vivia [place]   [phone]      Chief Complaint  Patient presents with  . 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 She has PMH of hypertension, hypothyroidism, arthritis, concussion, headache, depression, stress incontinence and basal cell skin cancer. 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  She has been admitted for a short-term rehabilitation.  PAST MEDICAL HISTORY:  Past Medical History  Diagnosis Date  . Hypertension   . Hypothyroidism   . Arthritis   . Concussion   . Headache   . Depression   . Stress incontinence   . Cancer     basal cell skin cancer     CURRENT MEDICATIONS: Reviewed  Patient's Medications  New Prescriptions   No medications on file  Previous Medications   ASCORBIC ACID (VITAMIN C PO)    Take 1 capsule by mouth daily.   ASPIRIN EC 325 MG TABLET    Take 1 tablet (325 mg total) by mouth 2 (two) times daily after a meal. Take x 1 month post op to decrease risk of blood clots.   ATENOLOL (TENORMIN) 50 MG TABLET    Take 100 mg by mouth every morning.    BIOTIN PO    Take 1 tablet by mouth daily.   CALCIUM-VITAMIN D PO    Take 1 tablet by mouth daily.   CITALOPRAM (CELEXA)  20 MG TABLET    Take 20 mg by mouth daily.   FERROUS SULFATE 325 (65 FE) MG TABLET    Take 325 mg by mouth daily with breakfast.   LEVOTHYROXINE (SYNTHROID, LEVOTHROID) 125 MCG TABLET    Take 125 mcg by mouth daily.   LOSARTAN (COZAAR) 50 MG TABLET    Take 50 mg by mouth daily.   OMEGA-3 FATTY ACIDS (FISH OIL PO)    Take 1 capsule by mouth daily.    OXYCODONE-ACETAMINOPHEN (PERCOCET/ROXICET) 5-325 MG PER TABLET    Take 1-2 tablets by mouth every 4 (four) hours as needed for severe pain.   PRAVASTATIN (PRAVACHOL) 10 MG TABLET    Take 10 tablets by mouth daily.   TRAZODONE (DESYREL) 50 MG TABLET    Take 50 mg by mouth at bedtime.  Modified Medications   No medications on file  Discontinued Medications   No medications on file     Allergies  Allergen Reactions  . Phenergan [Promethazine Hcl]     hallucinations     REVIEW OF SYSTEMS:  GENERAL: no change in appetite, no fatigue, no weight changes, no fever, chills or weakness EYES: Denies change in vision, dry eyes, eye pain, itching or discharge EARS: Denies change in hearing, ringing in ears, or earache NOSE: Denies nasal congestion or  epistaxis MOUTH and THROAT: Denies oral discomfort, gingival pain or bleeding, pain from teeth or hoarseness   RESPIRATORY: no cough, SOB, DOE, wheezing, hemoptysis CARDIAC: no chest pain, edema or palpitations GI: no abdominal pain, diarrhea, heart burn, nausea or vomiting, +constipation GU: Denies dysuria, frequency, hematuria, incontinence, or discharge PSYCHIATRIC: Denies feeling of depression or anxiety. No report of hallucinations, insomnia, paranoia, or agitation   PHYSICAL EXAMINATION  GENERAL APPEARANCE: Well nourished. In no acute distress. Normal body habitus SKIN:  Right knee surgical site has aquacel dressing, dry and no erythema HEAD: Normal in size and contour. No evidence of trauma EYES: Lids open and close normally. No blepharitis, entropion or ectropion. PERRL. Conjunctivae are  clear and sclerae are white. Lenses are without opacity EARS: Pinnae are normal. Patient hears normal voice tunes of the examiner MOUTH and THROAT: Lips are without lesions. Oral mucosa is moist and without lesions. Tongue is normal in shape, size, and color and without lesions NECK: supple, trachea midline, no neck masses, no thyroid tenderness, no thyromegaly LYMPHATICS: no LAN in the neck, no supraclavicular LAN RESPIRATORY: breathing is even & unlabored, BS CTAB CARDIAC: RRR, no murmur,no extra heart sounds, no edema GI: abdomen soft, normal BS, no masses, no tenderness, no hepatomegaly, no splenomegaly EXTREMITIES:  Able to move X 4 extremities PSYCHIATRIC: Alert and oriented X 3. Affect and behavior are appropriate  LABS/RADIOLOGY: Labs reviewed: Basic Metabolic Panel:  Recent Labs  06/28/14 1301 03/08/15 1110 03/15/15 0610  NA 134* 135 133*  K 4.3 4.2 5.0  CL 99 99* 99*  CO2 26 26 27   GLUCOSE 146* 107* 173*  BUN 14 9 14   CREATININE 0.9 1.04* 1.04*  CALCIUM 9.4 9.5 8.5*   Liver Function Tests:  Recent Labs  06/15/14 2320 03/08/15 1110  AST 29 29  ALT 13 20  ALKPHOS 66 66  BILITOT 0.7 0.9  PROT 7.0 6.7  ALBUMIN 3.9 3.7    Recent Labs  06/16/14 0429  AMMONIA 42   CBC:  Recent Labs  06/15/14 2320 03/08/15 1110 03/15/15 0610 03/16/15 0618 03/17/15 0717  WBC 8.4 6.3 12.6* 15.5* 9.9  NEUTROABS 6.2 3.1  --   --   --   HGB 13.2 14.1 11.4* 9.7* 8.7*  HCT 39.6 42.1 34.6* 29.5* 26.3*  MCV 86.7 88.4 88.0 87.3 89.2  PLT 265 273 236 244 216   Lipid Panel:  Recent Labs  06/16/14 0429  HDL 55   Cardiac Enzymes:  Recent Labs  06/15/14 2320  TROPONINI <0.30   CBG:  Recent Labs  06/15/14 2354  GLUCAP 146*     Dg Chest 2 View  03/08/2015   CLINICAL DATA:  Preoperative respiratory examination prior to right knee surgery.  EXAM: CHEST  2 VIEW  COMPARISON:  10/23/2013  FINDINGS: Cardiomegaly identified.  There is no evidence of focal airspace  disease, pulmonary edema, suspicious pulmonary nodule/mass, pleural effusion, or pneumothorax. No acute bony abnormalities are identified.  IMPRESSION: Cardiomegaly without evidence of active cardiopulmonary disease.   Electronically Signed   By: Margarette Canada M.D.   On: 03/08/2015 12:36   Nm Bone Scan 3 Phase  02/23/2015   CLINICAL DATA:  Total knee replacement 20 years prior. Right knee pain 2 years  EXAM: NUCLEAR MEDICINE 3-PHASE BONE SCAN  TECHNIQUE: Radionuclide angiographic images, immediate static blood pool images, and 3-hour delayed static images were obtained of the knees after intravenous injection of radiopharmaceutical.  RADIOPHARMACEUTICALS:  Twenty-five mCi Tc-77m MDP  COMPARISON:  None.  FINDINGS: Vascular phase: Faint increased blood flow to the right knee compared left subtly localizing to the tibial plateau seen best on the composite images.  Blood pool phase: Uptake localizing to the medial and lateral aspect of the tibial plateau of the right knee.  Delayed phase: Focal activity localizing to the medial and to a lesser extent lateral right tibial plateau corresponds to abnormal blood pool activity and subtle hyperemia.  IMPRESSION: Abnormal uptake within the RIGHT tibial plateau on all 3 phases of bone scan is concerning for mild loosening of the prosthetic.   Electronically Signed   By: Suzy Bouchard M.D.   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 Dorna Leitz, orthopedic surgeon, in 2 weeks; check BMP  Hypertension - well controlled; continue losartan 50 mg 1 tab by mouth daily and Tenormin 100 mg  1 tab by mouth every morning  Depression - mood is stable; continue Celexa 20 mg 1 tab by mouth daily  Anemia, acute blood loss - hemoglobin 8.7; continue ferrous sulfate 325 mg 1 tab by mouth daily; check CBC  Hypothyroidism - TSH (06/16/14) 0.321; continue Synthroid 125 g 1 tab by mouth daily  Insomnia - continue trazodone 50 mg 1 tab by mouth daily at bedtime  Constipation - start senna S 2 tabs by mouth twice a day and MiraLAX 17 g by mouth twice a day; point juice on breakfast trace    Goals of care:  Short-term rehabilitation   [facility], NP [facility] [phone]

## 2015-03-21 ENCOUNTER — Non-Acute Institutional Stay (SKILLED_NURSING_FACILITY): Payer: Medicare Other | Admitting: Internal Medicine

## 2015-03-21 DIAGNOSIS — N289 Disorder of kidney and ureter, unspecified: Secondary | ICD-10-CM

## 2015-03-21 DIAGNOSIS — E039 Hypothyroidism, unspecified: Secondary | ICD-10-CM | POA: Diagnosis not present

## 2015-03-21 DIAGNOSIS — K5901 Slow transit constipation: Secondary | ICD-10-CM

## 2015-03-21 DIAGNOSIS — Z96659 Presence of unspecified artificial knee joint: Secondary | ICD-10-CM

## 2015-03-21 DIAGNOSIS — F330 Major depressive disorder, recurrent, mild: Secondary | ICD-10-CM

## 2015-03-21 DIAGNOSIS — D62 Acute posthemorrhagic anemia: Secondary | ICD-10-CM | POA: Diagnosis not present

## 2015-03-21 DIAGNOSIS — I10 Essential (primary) hypertension: Secondary | ICD-10-CM | POA: Diagnosis not present

## 2015-03-21 DIAGNOSIS — R2681 Unsteadiness on feet: Secondary | ICD-10-CM

## 2015-03-21 DIAGNOSIS — T84038D Mechanical loosening of other internal prosthetic joint, subsequent encounter: Secondary | ICD-10-CM

## 2015-03-22 NOTE — Progress Notes (Signed)
Patient ID: Samantha Stone, female   DOB: Oct 29, 1930, 81 y.o.   MRN: [id number]      [facility]   PCP: FRIED, Jaymes Graff, MD  Code Status: full code  Allergies  Allergen Reactions  . Phenergan [Promethazine Hcl]     hallucinations    Chief Complaint  Patient presents with  . New Admit To SNF     HPI:  81 y.o. 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 She had acute blood loss anemia. She is seen in her room today with her daughter present. She has PMH of hypertension, hypothyroidism, arthritis among others. Her pain is under control with current regimen. Denies muscle spasm. Her appetite is poor and bowel movement has slowed down. Last bowel movement on Saturday and at home had bowel movement twice a day. She gets tired easily.  Review of Systems:  Constitutional: Negative for fever, chills, diaphoresis.  HENT: Negative for headache, congestion, nasal discharge  Respiratory: Negative for cough, shortness of breath and wheezing.   Cardiovascular: Negative for chest pain, palpitations, leg swelling.  Gastrointestinal: Negative for heartburn, nausea, vomiting, abdominal pain Genitourinary: Negative for dysuria, flank pain. has stress incontinence Musculoskeletal: Negative for back pain, falls Skin: Negative for itching, rash.  Neurological: Negative for dizziness, tingling, focal weakness Psychiatric/Behavioral: Negative for depression   Past Medical History  Diagnosis Date  . Hypertension   . Hypothyroidism   . Arthritis   . Concussion   . Headache   . Depression   . Stress incontinence   . Cancer     basal cell skin cancer   Past Surgical History  Procedure Laterality Date  . I&d extremity  03/11/2012    Procedure: MINOR IRRIGATION AND DEBRIDEMENT  EXTREMITY;  Surgeon: Cammie Sickle., MD;  Location: [place];  Service: Orthopedics;  Laterality: Right;  Right little finger  . Tonsillectomy    . Appendectomy    . Abdominal hysterectomy    . Trigger finger release    . Knee arthroscopy      bilat   . Replacement total knee bilateral    . Carpal tunnel release      rt x2  . Tubal ligation    . Colonoscopy    . Finger amputation Right     little finger  . Eye surgery Bilateral     cataract surgery with lens implant  . Total knee revision Right 03/14/2015    Procedure: TIBIAL REVISION, OPEN REDUCTION INTERNAL FIXATION PROXIMAL TIBIA;  Surgeon: Dorna Leitz, MD;  Location: [facility];  Service: Orthopedics;  Laterality: Right;   Social History:   reports that she has never smoked. She has never used smokeless tobacco. She reports that she drinks about 8.4 oz of alcohol per week. She reports that she does not use illicit drugs.  Family History  Problem Relation Age of Onset  . Bladder Cancer Father     Medications:   Medication List       This list is accurate as of: 03/21/15 11:59 PM.  Always use your most recent med list.               aspirin EC 325 MG tablet  Take 1 tablet (325 mg total) by mouth 2 (two) times daily after a meal. Take x 1 month  post op to decrease risk of blood clots.     atenolol 50 MG tablet  Commonly known as:  TENORMIN  Take 100 mg by mouth every morning.     BIOTIN PO  Take 1 tablet by mouth daily.     CALCIUM-VITAMIN D PO  Take 1 tablet by mouth daily.     citalopram 20 MG tablet  Commonly known as:  CELEXA  Take 20 mg by mouth daily.     ferrous sulfate 325 (65 FE) MG tablet  Take 325 mg by mouth daily with breakfast.     FISH OIL PO  Take 1 capsule by mouth daily.     levothyroxine 125 MCG tablet  Commonly known as:  SYNTHROID, LEVOTHROID  Take 125 mcg by mouth daily.     losartan 50 MG tablet  Commonly known as:  COZAAR  Take 50 mg by mouth daily.      oxyCODONE-acetaminophen 5-325 MG per tablet  Commonly known as:  PERCOCET/ROXICET  Take 1 tablet by mouth every 4 (four) hours. And Percocet 5/325 mg 1 tab PO Q 4 hours PRN     polyethylene glycol powder powder  Commonly known as:  GLYCOLAX/MIRALAX  Take 17 g by mouth 2 (two) times daily.     pravastatin 10 MG tablet  Commonly known as:  PRAVACHOL  Take 10 tablets by mouth daily.     sennosides-docusate sodium 8.6-50 MG tablet  Commonly known as:  SENOKOT-S  Take 2 tablets by mouth 2 (two) times daily.     traZODone 50 MG tablet  Commonly known as:  DESYREL  Take 50 mg by mouth at bedtime.     VITAMIN C PO  Take 1 capsule by mouth daily.         Physical Exam: Filed Vitals:   03/21/15 1741  BP: 106/80  Pulse: 61  Temp: 98 F (36.7 C)  Resp: 18  SpO2: 95%    General- elderly female, obese and in no acute distress Head- normocephalic, atraumatic Throat- moist mucus membrane  Eyes- PERRLA, EOMI, no pallor, no icterus, no discharge, normal conjunctiva, normal sclera Neck- no cervical lymphadenopathy Cardiovascular- normal s1,s2, no murmurs, palpable dorsalis pedis and radial pulses, right 1+ leg edema Respiratory- bilateral clear to auscultation, no wheeze, no rhonchi, no crackles, no use of accessory muscles Abdomen- bowel sounds present, soft, non tender Musculoskeletal- able to move all 4 extremities, limited right leg range of motion, unsteady gait  Neurological- no focal deficit, alert and oriented Skin- warm and dry, right knee surgical incision with aquacel dressing Psychiatry- normal mood and affect    Labs reviewed: Basic Metabolic Panel:  Recent Labs  06/28/14 1301 03/08/15 1110 03/15/15 0610  NA 134* 135 133*  K 4.3 4.2 5.0  CL 99 99* 99*  CO2 26 26 27   GLUCOSE 146* 107* 173*  BUN 14 9 14   CREATININE 0.9 1.04* 1.04*  CALCIUM 9.4 9.5 8.5*   Liver Function Tests:  Recent Labs  06/15/14 2320 03/08/15 1110  AST 29 29  ALT 13 20  ALKPHOS  66 66  BILITOT 0.7 0.9  PROT 7.0 6.7  ALBUMIN 3.9 3.7   No results for input(s): LIPASE, AMYLASE in the last 8760 hours.  Recent Labs  06/16/14 0429  AMMONIA 42   CBC:  Recent Labs  06/15/14 2320 03/08/15 1110 03/15/15 0610 03/16/15 0618 03/17/15 0717  WBC 8.4 6.3 12.6* 15.5* 9.9  NEUTROABS 6.2 3.1  --   --   --  HGB 13.2 14.1 11.4* 9.7* 8.7*  HCT 39.6 42.1 34.6* 29.5* 26.3*  MCV 86.7 88.4 88.0 87.3 89.2  PLT 265 273 236 244 216   Cardiac Enzymes:  Recent Labs  06/15/14 2320  TROPONINI <0.30   BNP: Invalid input(s): POCBNP CBG:  Recent Labs  06/15/14 2354  GLUCAP 146*    Radiological Exams: Dg Chest 2 View  03/08/2015   CLINICAL DATA:  Preoperative respiratory examination prior to right knee surgery.  EXAM: CHEST  2 VIEW  COMPARISON:  10/23/2013  FINDINGS: Cardiomegaly identified.  There is no evidence of focal airspace disease, pulmonary edema, suspicious pulmonary nodule/mass, pleural effusion, or pneumothorax. No acute bony abnormalities are identified.  IMPRESSION: Cardiomegaly without evidence of active cardiopulmonary disease.   Electronically Signed   By: Margarette Canada M.D.   On: 03/08/2015 12:36    Assessment/Plan  Unsteady gait Will have her work with physical therapy and occupational therapy team to help with gait training and muscle strengthening exercises.fall precautions. Skin care. Encourage to be out of bed.   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 Continue percocet 5-325 mg 1 tab q4h pain, RLE WBAT, continue aspirin ec 325 mf bid for 4 weeks for dvt prophylaxis. Has f/u with orthopedics. Will have patient work with PT/OT as tolerated to regain strength and restore function.  Fall precautions are in place. Continue to use CPM. Add ted hose to help with leg edema  Acute blood loss anemia Hb 9.1 on review. Increase ferrous sulfate to 325 mg bid and recheck cbc  03/24/15  Constipation D/c mag citrate and miralax, continue senna s and start linzess 145 mcg daily, hydration encouraged. Monitor  Acute renal failure Seen in lab work from hospital, monitor bmp  Hypertension  Stable, monitor bp, continue losartan 50 mg daily and Tenormin 100 mg daily  Hypothyroidism Continue synthroid 125 mcg daily and monitor  Depression Stable mood this visit. continue Celexa 20 mg daily and trazodone 50 mg daily at bedtime   Goals of care: short term rehabilitation   Labs/tests ordered: cbc, bmp  Family/ staff Communication: reviewed care plan with patient and nursing supervisor    Blanchie Serve, MD  [facility] [phone] (Monday-Friday 8 am - 5 pm) [phone] (afterhours)

## 2015-03-24 ENCOUNTER — Encounter: Payer: Self-pay | Admitting: Adult Health

## 2015-03-24 ENCOUNTER — Non-Acute Institutional Stay (SKILLED_NURSING_FACILITY): Payer: Medicare Other | Admitting: Adult Health

## 2015-03-24 DIAGNOSIS — I10 Essential (primary) hypertension: Secondary | ICD-10-CM | POA: Diagnosis not present

## 2015-03-24 DIAGNOSIS — F32A Depression, unspecified: Secondary | ICD-10-CM

## 2015-03-24 DIAGNOSIS — F329 Major depressive disorder, single episode, unspecified: Secondary | ICD-10-CM

## 2015-03-24 DIAGNOSIS — K5900 Constipation, unspecified: Secondary | ICD-10-CM | POA: Diagnosis not present

## 2015-03-24 DIAGNOSIS — D62 Acute posthemorrhagic anemia: Secondary | ICD-10-CM | POA: Diagnosis not present

## 2015-03-24 DIAGNOSIS — G4700 Insomnia, unspecified: Secondary | ICD-10-CM

## 2015-03-24 DIAGNOSIS — E039 Hypothyroidism, unspecified: Secondary | ICD-10-CM | POA: Diagnosis not present

## 2015-03-24 DIAGNOSIS — T84032S Mechanical loosening of internal right knee prosthetic joint, sequela: Secondary | ICD-10-CM | POA: Diagnosis not present

## 2015-03-27 NOTE — Progress Notes (Signed)
Patient ID: Samantha Stone, female   DOB: 02/24/1931, 81 y.o.   MRN: [id number]    DATE:  03/24/15 MRN:  [id number]  BIRTHDAY: Sep 29, 1930  Facility:  Nursing Home Location:  [place] Room Number: 1202-P  LEVEL OF CARE:  SNF [phone])  Contact Information    Name Relation Home Work [place] Daughter [phone]  [phone]   Dutch Gray [phone]  [phone]   Vivia [place]   [phone]      Chief Complaint  Patient presents with  . 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 She has been admitted to [facility] on 03/17/15 from [facility]. She has PMH of hypertension, hypothyroidism, arthritis, concussion, headache, depression, stress incontinence and basal cell skin cancer. 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  Patient was admitted to this facility for short-term rehabilitation after the patient's recent hospitalization.  Patient has completed SNF rehabilitation and therapy has cleared the patient for discharge.  PAST MEDICAL HISTORY:  Past Medical History  Diagnosis Date  . Hypertension   . Hypothyroidism   . Arthritis   . Concussion   . Headache   . Depression   . Stress incontinence   . Cancer     basal cell skin cancer     CURRENT MEDICATIONS: Reviewed  Patient's Medications  New Prescriptions   No medications on file  Previous Medications   ASCORBIC ACID (VITAMIN C PO)    Take 1 capsule by mouth daily.   ASPIRIN EC 325 MG TABLET    Take 1 tablet (325 mg total) by mouth 2 (two) times daily after a meal. Take x 1 month post op to decrease  risk of blood clots.   ATENOLOL (TENORMIN) 50 MG TABLET    Take 100 mg by mouth every morning.    ATORVASTATIN (LIPITOR) 10 MG TABLET    Take 10 mg by mouth daily.   BIOTIN PO    Take 1 tablet by mouth daily.   CALCIUM-VITAMIN D PO    Take 1 tablet by mouth daily.   CITALOPRAM (CELEXA) 20 MG TABLET    Take 20 mg by mouth daily.   FERROUS SULFATE 325 (65 FE) MG TABLET    Take 325 mg by mouth 2 (two) times daily with a meal.    LEVOTHYROXINE (SYNTHROID, LEVOTHROID) 125 MCG TABLET    Take 125 mcg by mouth daily.   LINACLOTIDE (LINZESS) 145 MCG CAPS CAPSULE    Take 145 mcg by mouth daily as needed.   LOSARTAN (COZAAR) 50 MG TABLET    Take 50 mg by mouth daily.   OMEGA-3 FATTY ACIDS (FISH OIL PO)    Take 1 capsule by mouth daily.    OXYCODONE-ACETAMINOPHEN (PERCOCET/ROXICET) 5-325 MG PER TABLET    Take 1 tablet by mouth every 4 (four) hours. And Percocet 5/325 mg 1 tab PO Q 4 hours PRN   SENNOSIDES-DOCUSATE SODIUM (SENOKOT-S) 8.6-50 MG TABLET    Take 2 tablets by mouth 2 (two) times daily.   TRAZODONE (DESYREL) 50 MG TABLET    Take 50 mg by mouth at bedtime.  Modified Medications  No medications on file  Discontinued Medications   POLYETHYLENE GLYCOL POWDER (GLYCOLAX/MIRALAX) POWDER    Take 17 g by mouth 2 (two) times daily.   PRAVASTATIN (PRAVACHOL) 10 MG TABLET    Take 10 tablets by mouth daily.     Allergies  Allergen Reactions  . Phenergan [Promethazine Hcl]     hallucinations     REVIEW OF SYSTEMS:  GENERAL: no change in appetite, no fatigue, no weight changes, no fever, chills or weakness EYES: Denies change in vision, dry eyes, eye pain, itching or discharge EARS: Denies change in hearing, ringing in ears, or earache NOSE: Denies nasal congestion or epistaxis MOUTH and THROAT: Denies oral discomfort, gingival pain or bleeding, pain from teeth or hoarseness   RESPIRATORY: no cough, SOB, DOE, wheezing, hemoptysis CARDIAC: no chest pain, edema or palpitations GI: no abdominal  pain, diarrhea, heart burn, nausea or vomiting, +constipation GU: Denies dysuria, frequency, hematuria, incontinence, or discharge PSYCHIATRIC: Denies feeling of depression or anxiety. No report of hallucinations, insomnia, paranoia, or agitation   PHYSICAL EXAMINATION  GENERAL APPEARANCE: Well nourished. In no acute distress. Normal body habitus SKIN:  Right knee surgical site has aquacel dressing, dry and no erythema HEAD: Normal in size and contour. No evidence of trauma EYES: Lids open and close normally. No blepharitis, entropion or ectropion. PERRL. Conjunctivae are clear and sclerae are white. Lenses are without opacity EARS: Pinnae are normal. Patient hears normal voice tunes of the examiner MOUTH and THROAT: Lips are without lesions. Oral mucosa is moist and without lesions. Tongue is normal in shape, size, and color and without lesions NECK: supple, trachea midline, no neck masses, no thyroid tenderness, no thyromegaly LYMPHATICS: no LAN in the neck, no supraclavicular LAN RESPIRATORY: breathing is even & unlabored, BS CTAB CARDIAC: RRR, no murmur,no extra heart sounds, no edema GI: abdomen soft, normal BS, no masses, no tenderness, no hepatomegaly, no splenomegaly EXTREMITIES:  Able to move X 4 extremities PSYCHIATRIC: Alert and oriented X 3. Affect and behavior are appropriate  LABS/RADIOLOGY: Labs reviewed: Basic Metabolic Panel:  Recent Labs  06/28/14 1301 03/08/15 1110 03/15/15 0610  NA 134* 135 133*  K 4.3 4.2 5.0  CL 99 99* 99*  CO2 26 26 27   GLUCOSE 146* 107* 173*  BUN 14 9 14   CREATININE 0.9 1.04* 1.04*  CALCIUM 9.4 9.5 8.5*   Liver Function Tests:  Recent Labs  06/15/14 2320 03/08/15 1110  AST 29 29  ALT 13 20  ALKPHOS 66 66  BILITOT 0.7 0.9  PROT 7.0 6.7  ALBUMIN 3.9 3.7    Recent Labs  06/16/14 0429  AMMONIA 42   CBC:  Recent Labs  06/15/14 2320 03/08/15 1110 03/15/15 0610 03/16/15 0618 03/17/15 0717  WBC 8.4 6.3 12.6* 15.5* 9.9   NEUTROABS 6.2 3.1  --   --   --   HGB 13.2 14.1 11.4* 9.7* 8.7*  HCT 39.6 42.1 34.6* 29.5* 26.3*  MCV 86.7 88.4 88.0 87.3 89.2  PLT 265 273 236 244 216   Lipid Panel:  Recent Labs  06/16/14 0429  HDL 55   Cardiac Enzymes:  Recent Labs  06/15/14 2320  TROPONINI <0.30   CBG:  Recent Labs  06/15/14 2354  GLUCAP 146*     Dg Chest 2 View  03/08/2015   CLINICAL DATA:  Preoperative respiratory examination prior to right knee surgery.  EXAM: CHEST  2 VIEW  COMPARISON:  10/23/2013  FINDINGS: Cardiomegaly identified.  There is no evidence of focal airspace  disease, pulmonary edema, suspicious pulmonary nodule/mass, pleural effusion, or pneumothorax. No acute bony abnormalities are identified.  IMPRESSION: Cardiomegaly without evidence of active cardiopulmonary disease.   Electronically Signed   By: Margarette Canada M.D.   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 Dorna Leitz, orthopedic surgeon  Hypertension - well controlled; continue losartan 50 mg 1 tab by mouth daily and Tenormin 100 mg 1 tab by mouth every morning  Depression - mood is stable; continue Celexa 20 mg 1 tab by mouth daily  Anemia, acute blood loss - hemoglobin 8.7; continue ferrous sulfate 325 mg 1 tab by mouth BID  Hypothyroidism - TSH (06/16/14) 0.321; continue Synthroid 125 g 1 tab by mouth daily  Insomnia - continue trazodone 50 mg 1 tab by mouth daily at bedtime  Constipation - start senna S 2 tabs by mouth twice a day and Linzess 145 mcg daily PRN     I have filled out patient's discharge paperwork and written  prescriptions.  Patient will receive home health PT and OT.  Total discharge time: Less than 30 minutes  Discharge time involved coordination of the discharge process with Education officer, museum, nursing staff and therapy department. Medical justification for home health services verified.   [facility], NP [employer] [phone]

## 2015-09-18 ENCOUNTER — Encounter (HOSPITAL_BASED_OUTPATIENT_CLINIC_OR_DEPARTMENT_OTHER): Payer: Self-pay | Admitting: *Deleted

## 2015-09-18 ENCOUNTER — Emergency Department (HOSPITAL_BASED_OUTPATIENT_CLINIC_OR_DEPARTMENT_OTHER)
Admission: EM | Admit: 2015-09-18 | Discharge: 2015-09-18 | Disposition: A | Discharge status: Home / Self Care | Payer: Medicare Other | Attending: Emergency Medicine | Admitting: Emergency Medicine

## 2015-09-18 ENCOUNTER — Emergency Department (HOSPITAL_BASED_OUTPATIENT_CLINIC_OR_DEPARTMENT_OTHER): Payer: Medicare Other

## 2015-09-18 DIAGNOSIS — R404 Transient alteration of awareness: Secondary | ICD-10-CM | POA: Diagnosis not present

## 2015-09-18 DIAGNOSIS — I10 Essential (primary) hypertension: Secondary | ICD-10-CM | POA: Diagnosis not present

## 2015-09-18 DIAGNOSIS — Z85828 Personal history of other malignant neoplasm of skin: Secondary | ICD-10-CM | POA: Diagnosis not present

## 2015-09-18 DIAGNOSIS — T402X5A Adverse effect of other opioids, initial encounter: Secondary | ICD-10-CM | POA: Diagnosis not present

## 2015-09-18 DIAGNOSIS — Z87448 Personal history of other diseases of urinary system: Secondary | ICD-10-CM | POA: Insufficient documentation

## 2015-09-18 DIAGNOSIS — Z7982 Long term (current) use of aspirin: Secondary | ICD-10-CM | POA: Insufficient documentation

## 2015-09-18 DIAGNOSIS — E039 Hypothyroidism, unspecified: Secondary | ICD-10-CM | POA: Diagnosis not present

## 2015-09-18 DIAGNOSIS — Z87828 Personal history of other (healed) physical injury and trauma: Secondary | ICD-10-CM | POA: Diagnosis not present

## 2015-09-18 DIAGNOSIS — Z79899 Other long term (current) drug therapy: Secondary | ICD-10-CM | POA: Insufficient documentation

## 2015-09-18 DIAGNOSIS — M1990 Unspecified osteoarthritis, unspecified site: Secondary | ICD-10-CM | POA: Diagnosis not present

## 2015-09-18 DIAGNOSIS — R4182 Altered mental status, unspecified: Secondary | ICD-10-CM | POA: Diagnosis present

## 2015-09-18 DIAGNOSIS — F329 Major depressive disorder, single episode, unspecified: Secondary | ICD-10-CM | POA: Diagnosis not present

## 2015-09-18 DIAGNOSIS — T50905A Adverse effect of unspecified drugs, medicaments and biological substances, initial encounter: Secondary | ICD-10-CM

## 2015-09-18 LAB — TROPONIN I: Troponin I: <0.03 ng/mL (ref <0.031)

## 2015-09-18 LAB — CBC
HCT: 37.6 % (ref 36.0–46.0)
Hemoglobin: 12.2 g/dL (ref 12.0–15.0)
MCH: 28.5 pg (ref 26.0–34.0)
MCHC: 32.4 g/dL (ref 30.0–36.0)
MCV: 87.9 fL (ref 78.0–100.0)
Platelets: 280 10*3/uL (ref 150–400)
RBC: 4.28 MIL/uL (ref 3.87–5.11)
RDW: 15.4 % (ref 11.5–15.5)
WBC: 7 10*3/uL (ref 4.0–10.5)

## 2015-09-18 LAB — COMPREHENSIVE METABOLIC PANEL
ALT: 21 U/L (ref 14–54)
AST: 26 U/L (ref 15–41)
Albumin: 4 g/dL (ref 3.5–5.0)
Alkaline Phosphatase: 66 U/L (ref 38–126)
Anion gap: 6 (ref 5–15)
BUN: 19 mg/dL (ref 6–20)
CO2: 29 mmol/L (ref 22–32)
Calcium: 9.1 mg/dL (ref 8.9–10.3)
Chloride: 101 mmol/L (ref 101–111)
Creatinine, Ser: 0.86 mg/dL (ref 0.44–1.00)
GFR calc Af Amer: >60 mL/min (ref >60)
GFR calc non Af Amer: >60 mL/min (ref >60)
Glucose, Bld: 128 mg/dL — ABNORMAL HIGH (ref 65–99)
Potassium: 4.3 mmol/L (ref 3.5–5.1)
Sodium: 136 mmol/L (ref 135–145)
Total Bilirubin: 1 mg/dL (ref 0.3–1.2)
Total Protein: 6.6 g/dL (ref 6.5–8.1)

## 2015-09-18 LAB — DIFFERENTIAL
Basophils Absolute: 0 10*3/uL (ref 0.0–0.1)
Basophils Relative: 0 %
Eosinophils Absolute: 0.3 10*3/uL (ref 0.0–0.7)
Eosinophils Relative: 5 %
Lymphocytes Relative: 28 %
Lymphs Abs: 1.9 10*3/uL (ref 0.7–4.0)
Monocytes Absolute: 0.7 10*3/uL (ref 0.1–1.0)
Monocytes Relative: 10 %
Neutro Abs: 4 10*3/uL (ref 1.7–7.7)
Neutrophils Relative %: 57 %

## 2015-09-18 LAB — CBG MONITORING, ED: Glucose-Capillary: 135 mg/dL — ABNORMAL HIGH (ref 65–99)

## 2015-09-18 LAB — PROTIME-INR
INR: 1 (ref 0.00–1.49)
Prothrombin Time: 13.4 seconds (ref 11.6–15.2)

## 2015-09-18 LAB — APTT: aPTT: 37 seconds (ref 24–37)

## 2015-09-18 NOTE — ED Provider Notes (Signed)
CSN: [id number]     Arrival date & time 09/18/15  1028 History   First MD Initiated Contact with Patient 09/18/15 1042     Chief Complaint  Patient presents with  . Altered Mental Status     (Consider location/radiation/quality/duration/timing/severity/associated sxs/prior Treatment) Patient is a 82 y.o. female presenting with altered mental status. The history is provided by the patient.  Altered Mental Status Presenting symptoms: confusion (felt "woozy" looking at screen following the words to hymns at church)   Severity:  Mild Most recent episode:  Today Episode history:  Single Duration:  1 hour Timing:  Constant Progression:  Unchanged Chronicity:  New Context: recent change in medication (started taking codeine cough syrup )   Context: not dementia   Associated symptoms: no fever and no weakness     Past Medical History  Diagnosis Date  . Hypertension   . Hypothyroidism   . Arthritis   . Concussion   . Headache   . Depression   . Stress incontinence   . Cancer ([place])     basal cell skin cancer   Past Surgical History  Procedure Laterality Date  . I&d extremity  03/11/2012    Procedure: MINOR IRRIGATION AND DEBRIDEMENT EXTREMITY;  Surgeon: Cammie Sickle., MD;  Location: [place];  Service: Orthopedics;  Laterality: Right;  Right little finger  . Tonsillectomy    . Appendectomy    . Abdominal hysterectomy    . Trigger finger release    . Knee arthroscopy      bilat   . Replacement total knee bilateral    . Carpal tunnel release      rt x2  . Tubal ligation    . Colonoscopy    . Finger amputation Right     little finger  . Eye surgery Bilateral     cataract surgery with lens implant  . Total knee revision Right 03/14/2015    Procedure: TIBIAL REVISION, OPEN REDUCTION INTERNAL FIXATION PROXIMAL TIBIA;  Surgeon: Dorna Leitz, MD;  Location: [place];  Service: Orthopedics;  Laterality: Right;   Family History  Problem Relation Age of Onset   . Bladder Cancer Father    Social History  Substance Use Topics  . Smoking status: Never Smoker   . Smokeless tobacco: Never Used  . Alcohol Use: 8.4 oz/week    14 Cans of beer per week     Comment: "couple" beers daily   OB History    No data available     Review of Systems  Constitutional: Negative for fever.  Neurological: Negative for weakness.  Psychiatric/Behavioral: Positive for confusion (felt "woozy" looking at screen following the words to hymns at church).  All other systems reviewed and are negative.     Allergies  Phenergan  Home Medications   Prior to Admission medications   Medication Sig Start Date End Date Taking? Authorizing Provider  Ascorbic Acid (VITAMIN C PO) Take 1 capsule by mouth daily.    Historical Provider, MD  aspirin EC 325 MG tablet Take 1 tablet (325 mg total) by mouth 2 (two) times daily after a meal. Take x 1 month post op to decrease risk of blood clots. 03/14/15   Gary Fleet, PA-C  atenolol (TENORMIN) 50 MG tablet Take 100 mg by mouth every morning.     Historical Provider, MD  atorvastatin (LIPITOR) 10 MG tablet Take 10 mg by mouth daily.    Historical Provider, MD  BIOTIN PO Take 1 tablet by  mouth daily.    Historical Provider, MD  CALCIUM-VITAMIN D PO Take 1 tablet by mouth daily.    Historical Provider, MD  citalopram (CELEXA) 20 MG tablet Take 20 mg by mouth daily.    Historical Provider, MD  ferrous sulfate 325 (65 FE) MG tablet Take 325 mg by mouth 2 (two) times daily with a meal.     Historical Provider, MD  levothyroxine (SYNTHROID, LEVOTHROID) 125 MCG tablet Take 125 mcg by mouth daily.    Historical Provider, MD  Linaclotide Rolan Lipa) 145 MCG CAPS capsule Take 145 mcg by mouth daily as needed.    Historical Provider, MD  losartan (COZAAR) 50 MG tablet Take 50 mg by mouth daily.    Historical Provider, MD  Omega-3 Fatty Acids (FISH OIL PO) Take 1 capsule by mouth daily.     Historical Provider, MD  oxyCODONE-acetaminophen  (PERCOCET/ROXICET) 5-325 MG per tablet Take 1 tablet by mouth every 4 (four) hours. And Percocet 5/325 mg 1 tab PO Q 4 hours PRN    Historical Provider, MD  sennosides-docusate sodium (SENOKOT-S) 8.6-50 MG tablet Take 2 tablets by mouth 2 (two) times daily.    Historical Provider, MD  traZODone (DESYREL) 50 MG tablet Take 50 mg by mouth at bedtime.    Historical Provider, MD   BP 124/59 mmHg  Pulse 52  Temp(Src) 98 F (36.7 C) (Oral)  Resp 16  Ht 5\' 4"  (1.626 m)  Wt 256 lb (116.121 kg)  BMI 43.92 kg/m2  SpO2 96% Physical Exam  Constitutional: She is oriented to person, place, and time. She appears well-developed and well-nourished. No distress.  HENT:  Head: Normocephalic.  Eyes: Conjunctivae are normal.  Neck: Neck supple. No tracheal deviation present.  Cardiovascular: Normal rate and regular rhythm.   Pulmonary/Chest: Effort normal. No respiratory distress.  Abdominal: Soft. She exhibits no distension.  Neurological: She is alert and oriented to person, place, and time. She has normal strength. No cranial nerve deficit or sensory deficit. Coordination and gait normal. GCS eye subscore is 4. GCS verbal subscore is 5. GCS motor subscore is 6.  Skin: Skin is warm and dry.  Psychiatric: She has a normal mood and affect.    ED Course  Procedures (including critical care time) Labs Review Labs Reviewed  COMPREHENSIVE METABOLIC PANEL - Abnormal; Notable for the following:    Glucose, Bld 128 (*)    All other components within normal limits  CBG MONITORING, ED - Abnormal; Notable for the following:    Glucose-Capillary 135 (*)    All other components within normal limits  PROTIME-INR  APTT  CBC  DIFFERENTIAL  TROPONIN I    Imaging Review Ct Head Wo Contrast  09/18/2015  CLINICAL DATA:  Pt with momentary episode of difficulty concentrating,communicating and processing written words this am in church, no issues with gait , denies HA,Dizziness,nauseaNo previous hx of stroke or  TIA, hx HTN controlled on meds, elevated cholesterol on meds, pt alert and oriented at time of scanStates she began a new cough medicine on Friday with codeine EXAM: CT HEAD WITHOUT CONTRAST TECHNIQUE: Contiguous axial images were obtained from the base of the skull through the vertex without intravenous contrast. COMPARISON:  06/15/2014 FINDINGS: The ventricles are normal and size, for this patient's age, and normal in configuration. There are no parenchymal masses or mass effect. There is no evidence of a cortical infarct. Patchy areas of white matter hypoattenuation noted consistent with moderate chronic microvascular ischemic change, stable from the prior study.  There are no extra-axial masses or abnormal fluid collections. There is no intracranial hemorrhage. Visualized sinuses are essentially clear as are the mastoid air cells. IMPRESSION: 1. No acute intracranial abnormalities. 2. Age related volume loss and moderate chronic microvascular ischemic change. Stable appearance from the prior study. Electronically Signed   By: Lajean Manes M.D.   On: 09/18/2015 11:03   I have personally reviewed and evaluated these images and lab results as part of my medical decision-making.   EKG Interpretation   Date/Time:  Sunday September 18 2015 10:58:48 EST Ventricular Rate:  57 PR Interval:  152 QRS Duration: 98 QT Interval:  503 QTC Calculation: 490 R Axis:   51 Text Interpretation:  Sinus rhythm Normal ECG Confirmed by Ulisses Vondrak MD,  Jeramie Scogin [id number]) on 09/18/2015 11:34:54 AM      MDM   Final diagnoses:  Transient alteration of awareness  Medication side effects, initial encounter    82 y.o. female presents with slight confusion episode while at church, she felt like she had difficulty making sense of the words on the tele-prompter for the handles. This occurred briefly over about 30 minutes. She had no significant speech symptoms, incoordination, facial droop or other concerning symptoms. She recently  started on codeine syrup for a cough. She took this prior to going to church. During the event she was able to ambulate without difficulty and front of the congregation to receive communion, and walked back to her seat and sit down. She described her symptoms to her family member following the service and they brought her here for evaluation. The stroke was activated but quickly canceled as patient is no longer symptomatic. Workup including head CT and labs show no evidence of infectious, metabolic, hematologic, cardiac, neurologic or other dangerous etiology currently. I have a very low suspicion for transient ischemic attack as patient had nonfocal symptoms and this is easily attributable to her use of codeine for cough suppression. I recommended that she does not take sedating agents prior to attending church. I recommended that she discontinue codeine syrup entirely and speak with her primary care physician about this medicine. No recurrence of symptoms throughout her emergency department course with continuous monitoring. Plan to follow up with PCP as needed and return precautions discussed for worsening or new concerning symptoms.     Leo Grosser, MD 09/18/15 [phone]

## 2015-09-18 NOTE — ED Notes (Signed)
Pt reports difficulty making sense of words while in church service today. Sx onset approx 0845. Last normal 0830

## 2015-09-18 NOTE — ED Notes (Signed)
Pt's daughter-in-law reports pt appeared "frazzled" and having difficulty finding words when she arrived to pick her up 1010 from church. She then brought pt to ED. Pt reports she has been on a new cough medicine with codeine since Friday and took a dose this morning

## 2015-09-18 NOTE — ED Notes (Signed)
Dr. Laneta Simmers at bedside to assess and speaking with Neuro. Code Stroke cancelled by Dr. Laneta Simmers

## 2015-09-18 NOTE — ED Notes (Signed)
Patient transported to CT 

## 2015-09-18 NOTE — Discharge Instructions (Signed)
Confusion Confusion is the inability to think with your usual speed or clarity. Confusion may come on quickly or slowly over time. How quickly the confusion comes on depends on the cause. Confusion can be due to any number of causes. CAUSES   Concussion, head injury, or head trauma.  Seizures.  Stroke.  Fever.  Brain tumor.  Age related decreased brain function (dementia).  Heightened emotional states like rage or terror.  Mental illness in which the person loses the ability to determine what is real and what is not (hallucinations).  Infections such as a urinary tract infection (UTI).  Toxic effects from alcohol, drugs, or prescription medicines.  Dehydration and an imbalance of salts in the body (electrolytes).  Lack of sleep.  Low blood sugar (diabetes).  Low levels of oxygen from conditions such as chronic lung disorders.  Drug interactions or other medicine side effects.  Nutritional deficiencies, especially niacin, thiamine, vitamin C, or vitamin B.  Sudden drop in body temperature (hypothermia).  Change in routine, such as when traveling or hospitalized. SIGNS AND SYMPTOMS  People often describe their thinking as cloudy or unclear when they are confused. Confusion can also include feeling disoriented. That means you are unaware of where or who you are. You may also not know what the date or time is. If confused, you may also have difficulty paying attention, remembering, and making decisions. Some people also act aggressively when they are confused.  DIAGNOSIS  The medical evaluation of confusion may include:  Blood and urine tests.  X-rays.  Brain and nervous system tests.  Analyzing your brain waves (electroencephalogram or EEG).  Magnetic resonance imaging (MRI) of your head.  Computed tomography (CT) scan of your head.  Mental status tests in which your health care provider may ask many questions. Some of these questions may seem silly or strange,  but they are a very important test to help diagnose and treat confusion. TREATMENT  An admission to the hospital may not be needed, but a person with confusion should not be left alone. Stay with a family member or friend until the confusion clears. Avoid alcohol, pain relievers, or sedative drugs until you have fully recovered. Do not drive until directed by your health care provider. HOME CARE INSTRUCTIONS  What family and friends can do:  To find out if someone is confused, ask the person to state his or her name, age, and the date. If the person is unsure or answers incorrectly, he or she is confused.  Always introduce yourself, no matter how well the person knows you.  Often remind the person of his or her location.  Place a calendar and clock near the confused person.  Help the person with his or her medicines. You may want to use a pill box, an alarm as a reminder, or give the person each dose as prescribed.  Talk about current events and plans for the day.  Try to keep the environment calm, quiet, and peaceful.  Make sure the person keeps follow-up visits with his or her health care provider. PREVENTION  Ways to prevent confusion:  Avoid alcohol.  Eat a balanced diet.  Get enough sleep.  Take medicine only as directed by your health care provider.  Do not become isolated. Spend time with other people and make plans for your days.  Keep careful watch on your blood sugar levels if you are diabetic. SEEK IMMEDIATE MEDICAL CARE IF:   You develop severe headaches, repeated vomiting, seizures, blackouts, or   slurred speech.  There is increasing confusion, weakness, numbness, restlessness, or personality changes.  You develop a loss of balance, have marked dizziness, feel uncoordinated, or fall.  You have delusions, hallucinations, or develop severe anxiety.  Your family members think you need to be rechecked.   This information is not intended to replace advice given  to you by your health care provider. Make sure you discuss any questions you have with your health care provider.   Document Released: 08/23/2004 Document Revised: 08/06/2014 Document Reviewed: 08/21/2013 Elsevier Interactive Patient Education 2016 [employer]  

## 2015-11-23 IMAGING — CT CT HEAD W/O CM
1 series · 16 of 30 positions shown, 20 images · non-contrast
Comparison: MRI brain 10/24/2013.  CT head 10/22/2013.

CLINICAL DATA: Headache, confusion, and difficulty following
commands for 4 hr.

EXAM:
CT HEAD WITHOUT CONTRAST
TECHNIQUE: Contiguous axial images were obtained from the base of the skull
through the vertex without intravenous contrast.

[Series 2: head 4.8 h37s · axial · 0.48mm/px · z∈[+1095,+1232]mm · 16 of 32 slices shown, 20 images]
[im 2/32  brain]
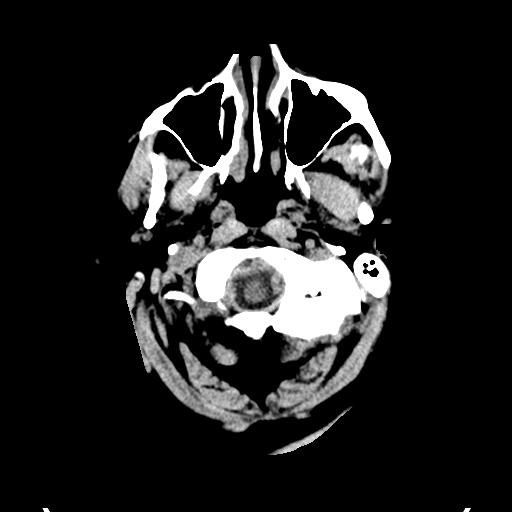
[im 2/32  bone]
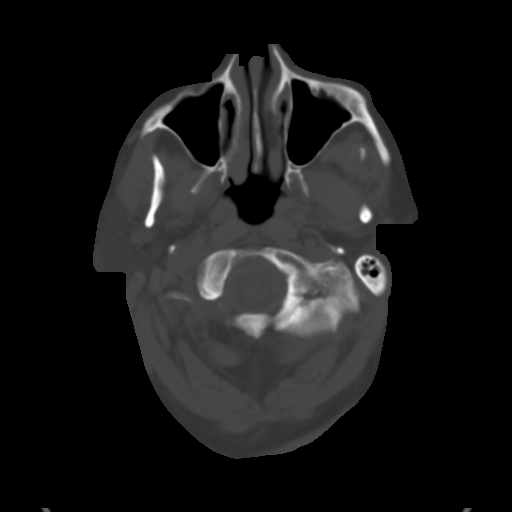
[im 4/32  brain]
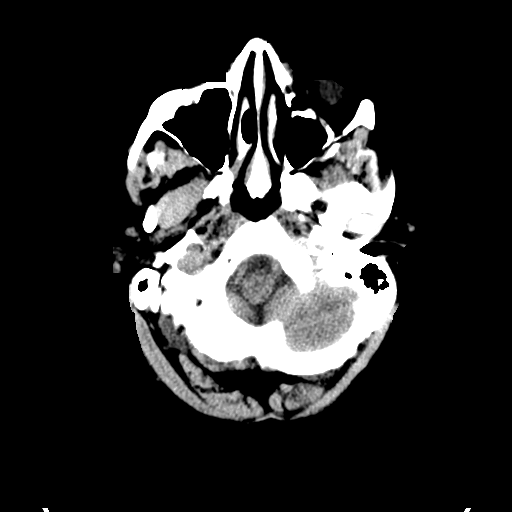
[im 6/32  brain]
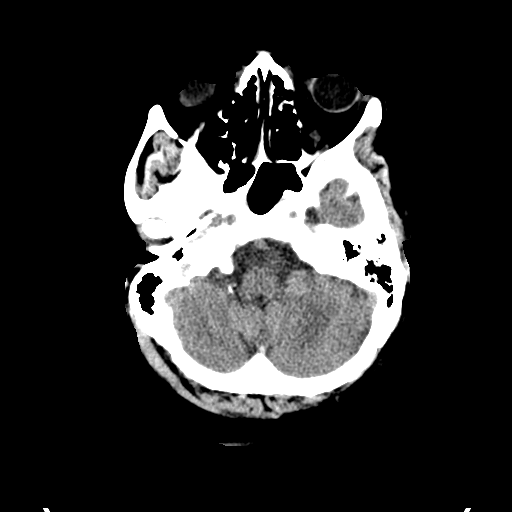
[im 8/32  brain]
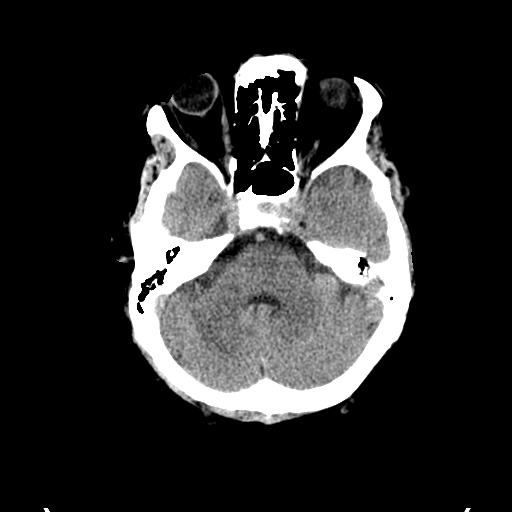
[im 9/32  brain]
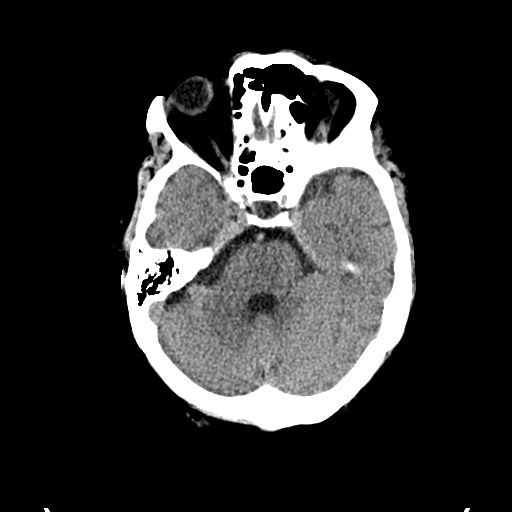
[im 9/32  bone]
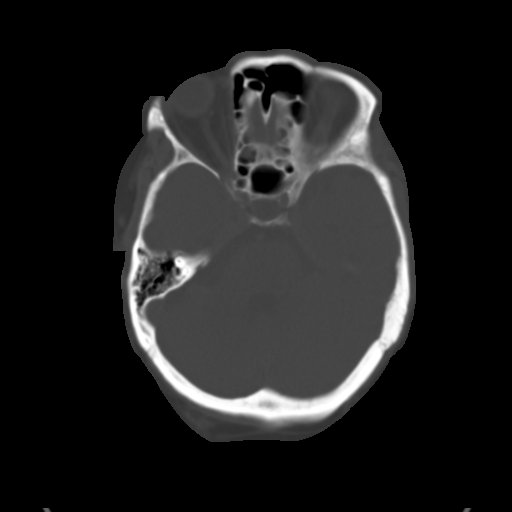
[im 11/32  brain]
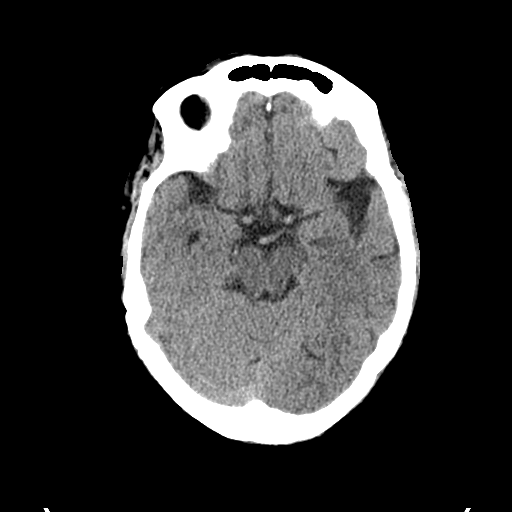
[im 13/32  brain]
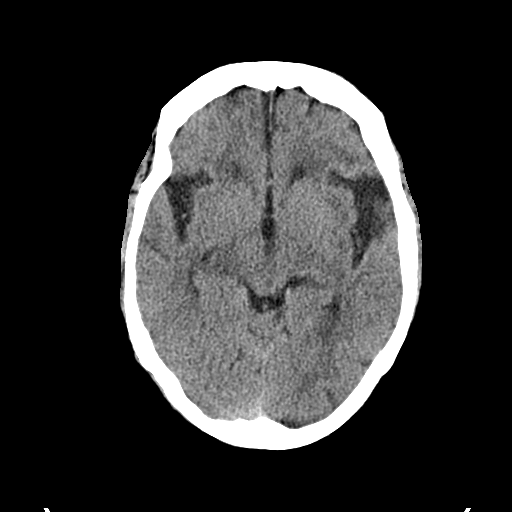
[im 15/32  brain]
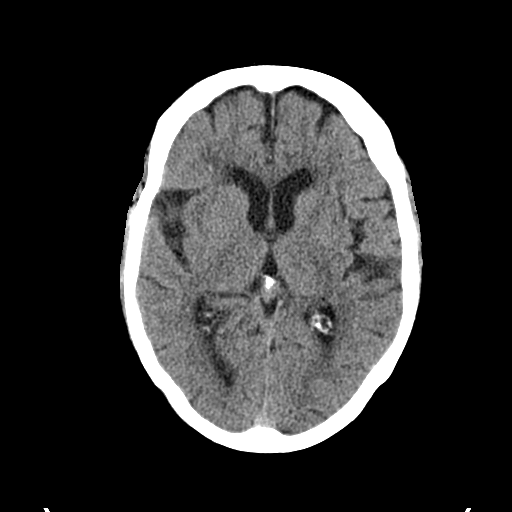
[im 17/32  brain]
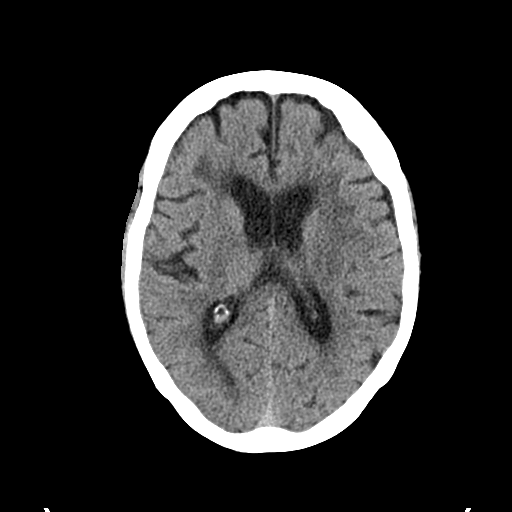
[im 17/32  bone]
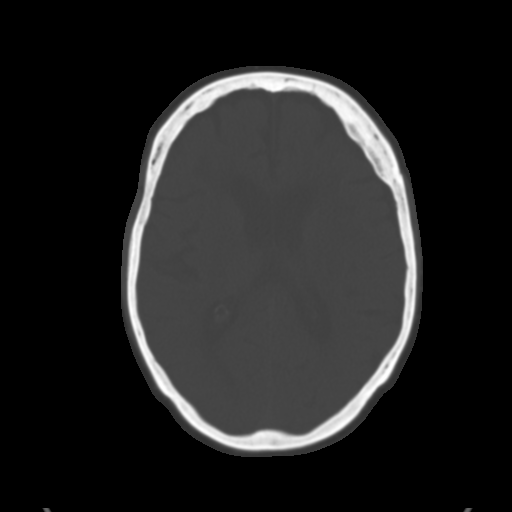
[im 19/32  brain]
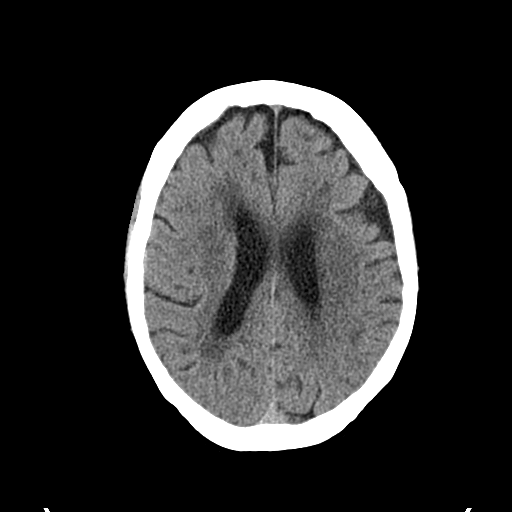
[im 21/32  brain]
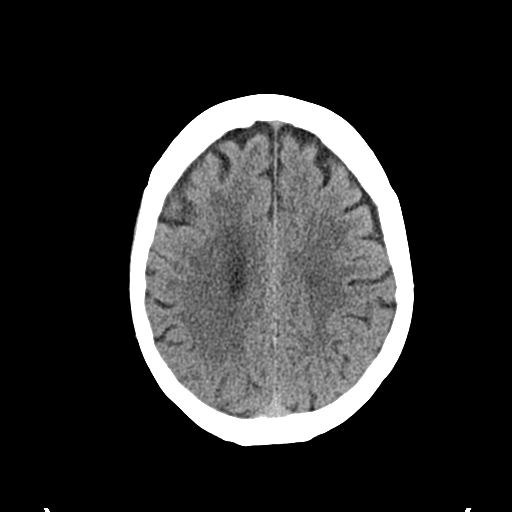
[im 23/32  brain]
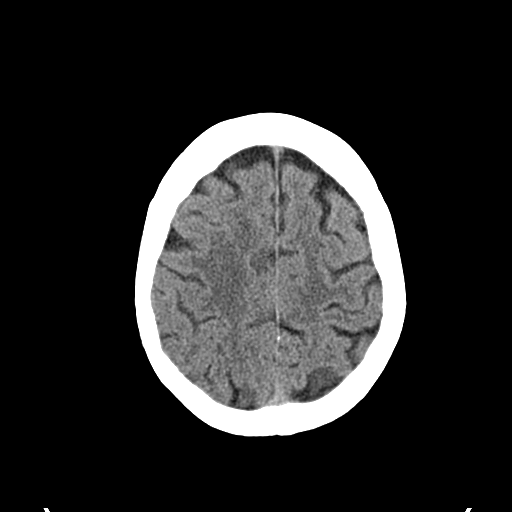
[im 24/32  brain]
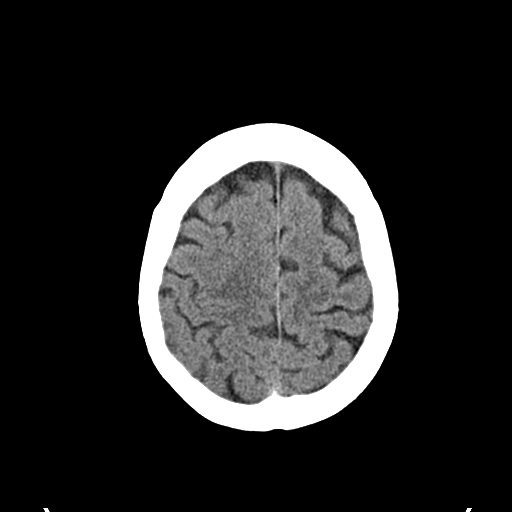
[im 24/32  bone]
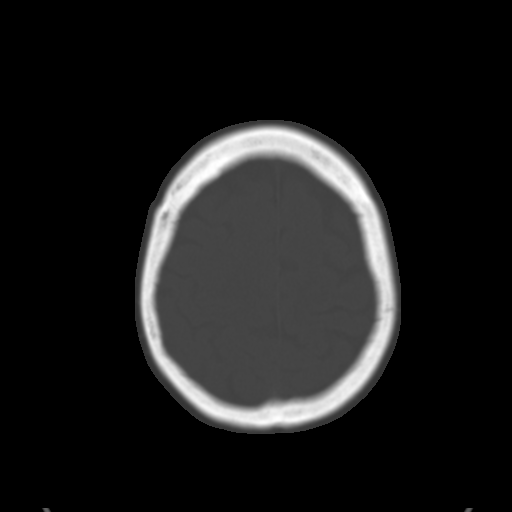
[im 26/32  brain]
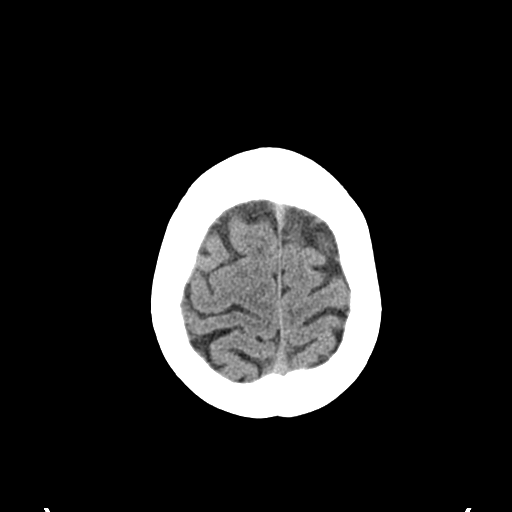
[im 28/32  brain]
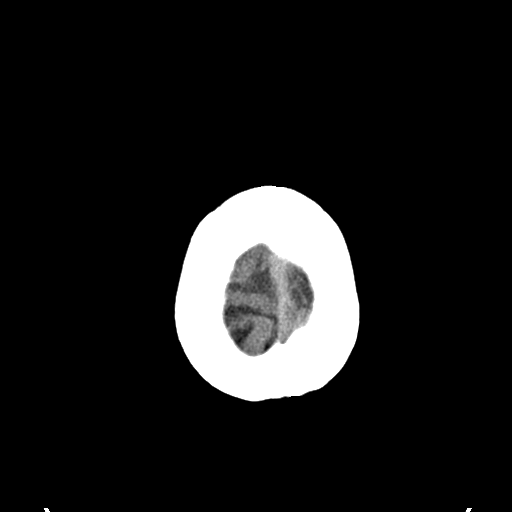
[im 30/32  brain]
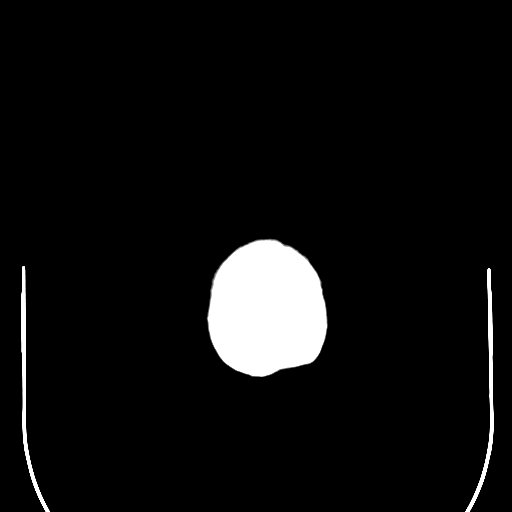

[16 of 30 positions shown; findings below may reference images not displayed]

FINDINGS: Diffuse cerebral atrophy. Patchy white matter changes consistent
with small vessel ischemia. No mass effect or midline shift. No
abnormal extra-axial fluid collections. Gray-white matter junctions
are distinct. Basal cisterns are not effaced. No evidence of acute
intracranial hemorrhage. No depressed skull fractures. Visualized
paranasal sinuses and mastoid air cells are not opacified.
IMPRESSION: No acute intracranial abnormalities. Chronic atrophy and small
vessel ischemic changes.

## 2016-05-07 ENCOUNTER — Encounter: Payer: Self-pay | Admitting: Cardiovascular Disease

## 2016-05-07 ENCOUNTER — Ambulatory Visit (INDEPENDENT_AMBULATORY_CARE_PROVIDER_SITE_OTHER): Payer: Medicare Other | Admitting: Cardiovascular Disease

## 2016-05-07 ENCOUNTER — Encounter (INDEPENDENT_AMBULATORY_CARE_PROVIDER_SITE_OTHER): Payer: Self-pay

## 2016-05-07 VITALS — BP 130/78 | HR 62 | Ht 64.0 in | Wt 264.1 lb

## 2016-05-07 DIAGNOSIS — I10 Essential (primary) hypertension: Secondary | ICD-10-CM

## 2016-05-07 DIAGNOSIS — I5032 Chronic diastolic (congestive) heart failure: Secondary | ICD-10-CM

## 2016-05-07 LAB — BASIC METABOLIC PANEL
BUN: 17 mg/dL (ref 7–25)
CO2: 30 mmol/L (ref 20–31)
Calcium: 9.2 mg/dL (ref 8.6–10.4)
Chloride: 100 mmol/L (ref 98–110)
Creat: 0.95 mg/dL — ABNORMAL HIGH (ref 0.60–0.88)
Glucose, Bld: 107 mg/dL — ABNORMAL HIGH (ref 65–99)
Potassium: 4.4 mmol/L (ref 3.5–5.3)
Sodium: 138 mmol/L (ref 135–146)

## 2016-05-07 MED ORDER — POTASSIUM CHLORIDE CRYS ER 20 MEQ PO TBCR: 20.0000 meq | EXTENDED_RELEASE_TABLET | Freq: Every day | ORAL | 3 refills | Status: DC

## 2016-05-07 MED ORDER — FUROSEMIDE 40 MG PO TABS: 40.0000 mg | ORAL_TABLET | Freq: Every day | ORAL | 3 refills | Status: DC

## 2016-05-07 NOTE — Patient Instructions (Signed)
Medication Instructions:  INCREASE Lasix to 40 mg once daily TAKE Kdur 20 meq once daily   Labwork: TODAY - basic metabolic panel  Your physician recommends that you return for lab work in: 3 weeks for basic metabolic panel   Testing/Procedures: Your physician has requested that you have an echocardiogram. Echocardiography is a painless test that uses sound waves to create images of your heart. It provides your doctor with information about the size and shape of your heart and how well your heart's chambers and valves are working. This procedure takes approximately one hour. There are no restrictions for this procedure.   Follow-Up: Your physician wants you to follow-up in: 3 months with Dr. Acie Fredrickson.  You will receive a reminder letter in the mail two months in advance. If you don't receive a letter, please call our office to schedule the follow-up appointment.   If you need a refill on your cardiac medications before your next appointment, please call your pharmacy.   Thank you for choosing [facility]! Christen Bame, RN [phone]

## 2016-05-07 NOTE — Progress Notes (Signed)
Cardiology Office Note   Date:  05/07/2016   ID:  Samantha Stone, DOB [birth year], MRN [id number]  PCP:  Abigail Miyamoto, MD  Cardiologist:   [name], MD   Chief Complaint  Patient presents with  . Hyperlipidemia   1. Hypertension 2. Possible TIAs - mental status changes , difficulty talking.  3. Hypothyroidism 4. Hyperlipidemia 5.   History of Present Illness:  Samantha Stone is an 82 yo who I have known for many years. 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   Samantha Stone has fallen several times over the past several months. She slipped on the wet floor on the ceramic tile. She presents for further management of her HTN. Her BP has been irregular at home. Her diet is fairly consistent. She does not eat much salt. She has frozen lasagna on occasion. Hot dog once a month or so.   She was admitted to [facility] in March, 2015 for mental confusion and HTN Echo showed:  Left ventricle: The cavity size was normal. Wall thickness was at the upper limits of normal. Systolic function was normal. The estimated ejection fraction was in the range of 60% to 65%. Wall motion was normal; there were no regional wall motion abnormalities. Doppler parameters are consistent with abnormal left ventricular relaxation (grade 1 diastolic dysfunction). - Mitral valve: Mild regurgitation. - Left atrium: The atrium was moderately to severely dilated. - Pulmonary arteries: Systolic pressure was mildly increased. PA peak pressure: 58mm Hg    Oct. 20, 2015:  Samantha Stone is doing well. She had her cataracts done.  No CP or dyspnea.    Feb. 19, 2016:   CAROLL PRIMMER is a 82 y.o. female who presents for her HTN and hyperlipidemia. Family issues,  - Marden Stone was placed in a nursing home.  Step daughter challenged.  Has had a rough 3-4 months.   Has not been exerciseing, diet has not been as strict recently.   Oct. 9, 2017:  Samantha Stone is seen today for follow up visit Doing well Has had a  chronic cough.   Was diagnosed with CHF - she had an echo in 2015 that showed normal LV systolic function and grade 1 diastolic dysfunction   Has signficant DOE  - gets DOE walking to her mailbox  She was started on Lasix 10 mg a day but has not noticed any increase in her urine output.  Past Medical History:  Diagnosis Date  . Arthritis   . Cancer ([place])    basal cell skin cancer  . Concussion   . Depression   . Headache   . Hypertension   . Hypothyroidism   . Stress incontinence     Past Surgical History:  Procedure Laterality Date  . ABDOMINAL HYSTERECTOMY    . APPENDECTOMY    . CARPAL TUNNEL RELEASE     rt x2  . COLONOSCOPY    . EYE SURGERY Bilateral    cataract surgery with lens implant  . FINGER AMPUTATION Right    little finger  . I&D EXTREMITY  03/11/2012   Procedure: MINOR IRRIGATION AND DEBRIDEMENT EXTREMITY;  Surgeon: Cammie Sickle., MD;  Location: [place];  Service: Orthopedics;  Laterality: Right;  Right little finger  . KNEE ARTHROSCOPY     bilat   . REPLACEMENT TOTAL KNEE BILATERAL    . TONSILLECTOMY    . TOTAL KNEE REVISION Right 03/14/2015   Procedure: TIBIAL REVISION, OPEN REDUCTION INTERNAL FIXATION PROXIMAL TIBIA;  Surgeon: Jenny Reichmann  Samantha Primas, MD;  Location: [place];  Service: Orthopedics;  Laterality: Right;  . TRIGGER FINGER RELEASE    . TUBAL LIGATION       Current Outpatient Prescriptions  Medication Sig Dispense Refill  . Ascorbic Acid (VITAMIN C PO) Take 1 capsule by mouth daily.    Marland Kitchen aspirin EC 325 MG tablet Take 1 tablet (325 mg total) by mouth 2 (two) times daily after a meal. Take x 1 month post op to decrease risk of blood clots. 60 tablet 0  . atenolol (TENORMIN) 50 MG tablet Take 100 mg by mouth every morning.     Marland Kitchen atorvastatin (LIPITOR) 10 MG tablet Take 10 mg by mouth daily.    Marland Kitchen BIOTIN PO Take 1 tablet by mouth daily.    Marland Kitchen CALCIUM-VITAMIN D PO Take 1 tablet by mouth daily.    . citalopram (CELEXA) 20 MG tablet Take 20  mg by mouth daily.    . ferrous sulfate 325 (65 FE) MG tablet Take 325 mg by mouth 2 (two) times daily with a meal.     . FLUZONE HIGH-DOSE 0.5 ML SUSY Inject as directed once.    . furosemide (LASIX) 20 MG tablet Take 10 mg by mouth daily.    Marland Kitchen levothyroxine (SYNTHROID, LEVOTHROID) 125 MCG tablet Take 125 mcg by mouth daily.    . Linaclotide (LINZESS) 145 MCG CAPS capsule Take 145 mcg by mouth daily as needed.    Marland Kitchen losartan (COZAAR) 50 MG tablet Take 50 mg by mouth daily.    . Omega-3 Fatty Acids (FISH OIL PO) Take 1 capsule by mouth daily.     Marland Kitchen oxyCODONE-acetaminophen (PERCOCET/ROXICET) 5-325 MG per tablet Take 1 tablet by mouth every 4 (four) hours. And Percocet 5/325 mg 1 tab PO Q 4 hours PRN    . pravastatin (PRAVACHOL) 10 MG tablet Take 10 mg by mouth daily.    Marland Kitchen PROAIR HFA 108 (90 Base) MCG/ACT inhaler Inhale 2 puffs into the lungs every 4 (four) hours as needed. AS NEEDED FOR WHEEZING OR SOB    . sennosides-docusate sodium (SENOKOT-S) 8.6-50 MG tablet Take 2 tablets by mouth 2 (two) times daily.    . traZODone (DESYREL) 50 MG tablet Take 50 mg by mouth at bedtime.     No current facility-administered medications for this visit.     Allergies:   Phenergan [promethazine hcl]    Social History:  The patient  reports that she has never smoked. She has never used smokeless tobacco. She reports that she drinks about 8.4 oz of alcohol per week . She reports that she does not use drugs.   Family History:  The patient's family history includes Bladder Cancer in her father.    ROS:  Please see the history of present illness.    Review of Systems: Constitutional:  denies fever, chills, diaphoresis, appetite change and fatigue.  HEENT: denies photophobia, eye pain, redness, hearing loss, ear pain, congestion, sore throat, rhinorrhea, sneezing, neck pain, neck stiffness and tinnitus.  Respiratory: denies SOB, DOE, cough, chest tightness, and wheezing.  Cardiovascular: denies chest pain,  palpitations and leg swelling.  Gastrointestinal: denies nausea, vomiting, abdominal pain, diarrhea, constipation, blood in stool.  Genitourinary: denies dysuria, urgency, frequency, hematuria, flank pain and difficulty urinating.  Musculoskeletal: denies  myalgias, back pain, joint swelling, arthralgias and gait problem.   Skin: denies pallor, rash and wound.  Neurological: denies dizziness, seizures, syncope, weakness, light-headedness, numbness and headaches.   Hematological: denies adenopathy, easy bruising, personal or  family bleeding history.  Psychiatric/ Behavioral: denies suicidal ideation, mood changes, confusion, nervousness, sleep disturbance and agitation.       All other systems are reviewed and negative.    PHYSICAL EXAM: VS:  BP (!) 160/82 (BP Location: Right Arm, Patient Position: Sitting, Cuff Size: Normal)   Pulse 62   Ht 5\' 4"  (1.626 m)   Wt 264 lb 1.9 oz (119.8 kg)   BMI 45.34 kg/m  , BMI Body mass index is 45.34 kg/m. GEN: Well nourished, well developed, in no acute distress  HEENT: normal  Neck: no JVD, carotid bruits, or masses Cardiac: RRR; no murmurs, rubs, or gallops,no edema  Respiratory:  clear to auscultation bilaterally, normal work of breathing GI: soft, nontender, nondistended, + BS MS: no deformity or atrophy  Skin: warm and dry, no rash Neuro:  Strength and sensation are intact Psych: normal   EKG:  EKG is not ordered today.    Recent Labs: 09/18/2015: ALT 21; BUN 19; Creatinine, Ser 0.86; Hemoglobin 12.2; Platelets 280; Potassium 4.3; Sodium 136    Lipid Panel    Component Value Date/Time   CHOL 154 06/16/2014 0429   TRIG 59 06/16/2014 0429   HDL 55 06/16/2014 0429   CHOLHDL 2.8 06/16/2014 0429   VLDL 12 06/16/2014 0429   LDLCALC 87 06/16/2014 0429      Wt Readings from Last 3 Encounters:  05/07/16 264 lb 1.9 oz (119.8 kg)  09/18/15 256 lb (116.1 kg)  03/24/15 266 lb (120.7 kg)      Other studies Reviewed: Additional  studies/ records that were reviewed today include: . Review of the above records demonstrates:    ASSESSMENT AND PLAN:  1. Hypertension - BP is doing well.  She needs to get back into her exercise and pay more attention to her diet.  2. Possible TIAs - mental status changes , difficulty talking.   3. Hypothyroidism  4. Hyperlipidemia - will check lipids in 6 months .   5.  DOE, cough / chronic diastolic CHF  Was thought to have CHF - apparently had an elevated BNP .  - has normal LV EF by echo in 2015.  She certainly could have diastolic CHF She was started on Lasix 10 mg a day which was not effective.  We'll increase her Lasix to 40 mg a day. We'll get an echocardiogram. We'll check a basic medical profile today and again in 3 weeks. I'll see her in 3 months for follow-up visit.    Current medicines are reviewed at length with the patient today.  The patient does not have concerns regarding medicines.  The following changes have been made:  no change      Signed, [name], MD  05/07/2016 3:15 PM    [facility] [place], [place], [place]  [address] Phone: [phone]; Fax: [phone]

## 2016-05-21 ENCOUNTER — Other Ambulatory Visit: Payer: Medicare Other | Admitting: *Deleted

## 2016-05-21 DIAGNOSIS — I10 Essential (primary) hypertension: Secondary | ICD-10-CM

## 2016-05-21 DIAGNOSIS — I5032 Chronic diastolic (congestive) heart failure: Secondary | ICD-10-CM

## 2016-05-22 LAB — BASIC METABOLIC PANEL
BUN: 16 mg/dL (ref 7–25)
CO2: 29 mmol/L (ref 20–31)
Calcium: 9.3 mg/dL (ref 8.6–10.4)
Chloride: 99 mmol/L (ref 98–110)
Creat: 0.92 mg/dL — ABNORMAL HIGH (ref 0.60–0.88)
Glucose, Bld: 94 mg/dL (ref 65–99)
Potassium: 4.6 mmol/L (ref 3.5–5.3)
Sodium: 137 mmol/L (ref 135–146)

## 2016-05-23 ENCOUNTER — Other Ambulatory Visit (HOSPITAL_COMMUNITY): Payer: Medicare Other

## 2016-05-28 ENCOUNTER — Other Ambulatory Visit: Payer: Medicare Other

## 2016-06-06 ENCOUNTER — Ambulatory Visit (HOSPITAL_COMMUNITY): Payer: Medicare Other | Attending: Cardiology

## 2016-06-06 ENCOUNTER — Other Ambulatory Visit: Payer: Self-pay

## 2016-06-06 DIAGNOSIS — I340 Nonrheumatic mitral (valve) insufficiency: Secondary | ICD-10-CM | POA: Diagnosis not present

## 2016-06-06 DIAGNOSIS — E785 Hyperlipidemia, unspecified: Secondary | ICD-10-CM | POA: Diagnosis not present

## 2016-06-06 DIAGNOSIS — I110 Hypertensive heart disease with heart failure: Secondary | ICD-10-CM | POA: Insufficient documentation

## 2016-06-06 DIAGNOSIS — I5032 Chronic diastolic (congestive) heart failure: Secondary | ICD-10-CM | POA: Insufficient documentation

## 2016-08-03 ENCOUNTER — Other Ambulatory Visit: Payer: Self-pay | Admitting: Physician Assistant

## 2016-08-03 DIAGNOSIS — R05 Cough: Secondary | ICD-10-CM

## 2016-08-03 DIAGNOSIS — R059 Cough, unspecified: Secondary | ICD-10-CM

## 2016-08-09 ENCOUNTER — Ambulatory Visit
Admission: RE | Admit: 2016-08-09 | Discharge: 2016-08-09 | Disposition: A | Discharge status: Home / Self Care | Payer: Medicare Other | Source: Ambulatory Visit | Attending: Physician Assistant | Admitting: Physician Assistant

## 2016-08-09 DIAGNOSIS — R05 Cough: Secondary | ICD-10-CM

## 2016-08-09 DIAGNOSIS — R059 Cough, unspecified: Secondary | ICD-10-CM

## 2016-08-09 MED ORDER — IOPAMIDOL (ISOVUE-300) INJECTION 61%
75.0000 mL | Freq: Once | INTRAVENOUS | Status: AC | PRN
Start: 2016-08-09 — End: 2016-08-09
  Administered 2016-08-09: 75 mL via INTRAVENOUS

## 2016-08-15 IMAGING — CR DG CHEST 2V
2 series · 2 of 2 positions shown · non-contrast
Comparison: 10/23/2013

CLINICAL DATA: Preoperative respiratory examination prior to right
knee surgery.

EXAM:
CHEST  2 VIEW

[w chest pa]
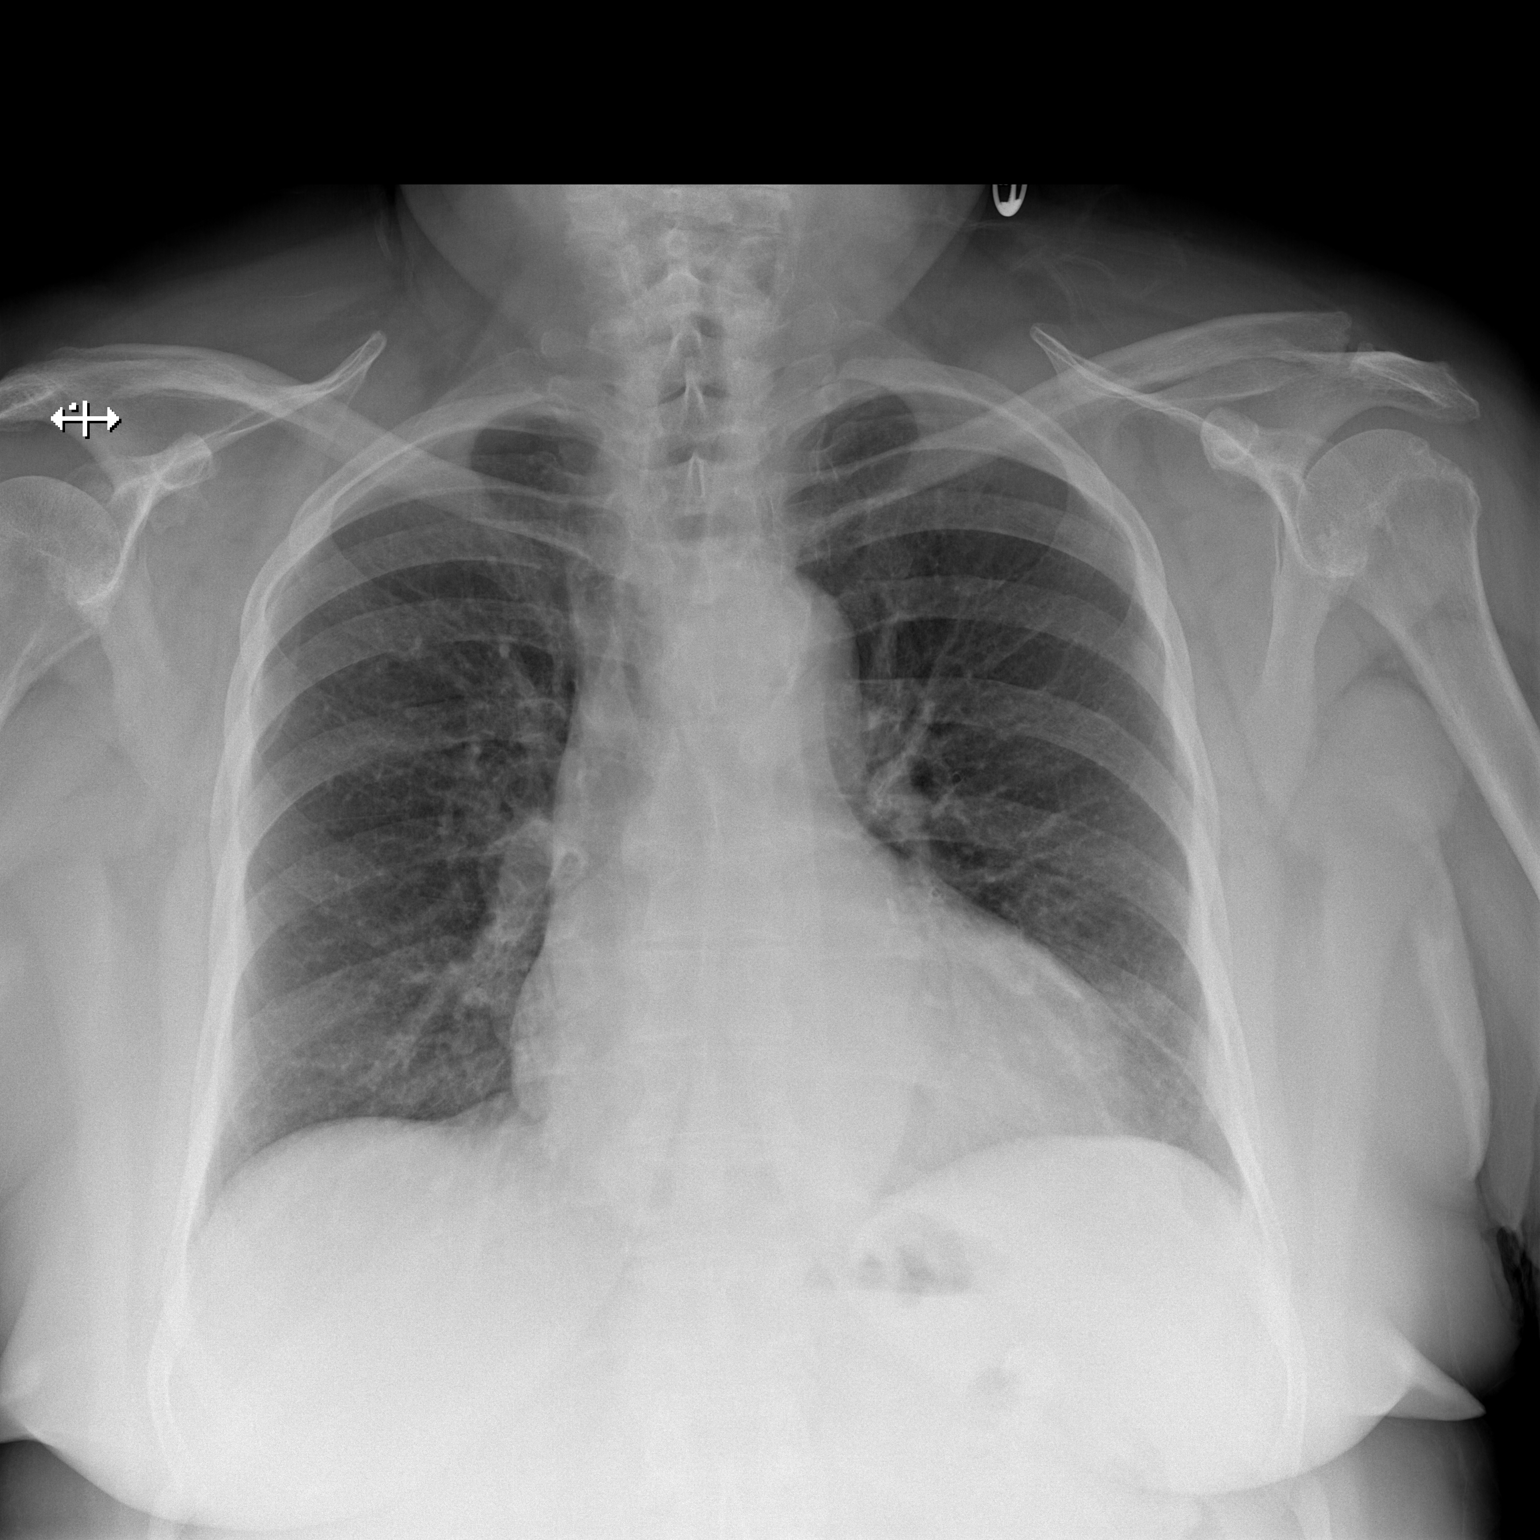

[w chest lat]
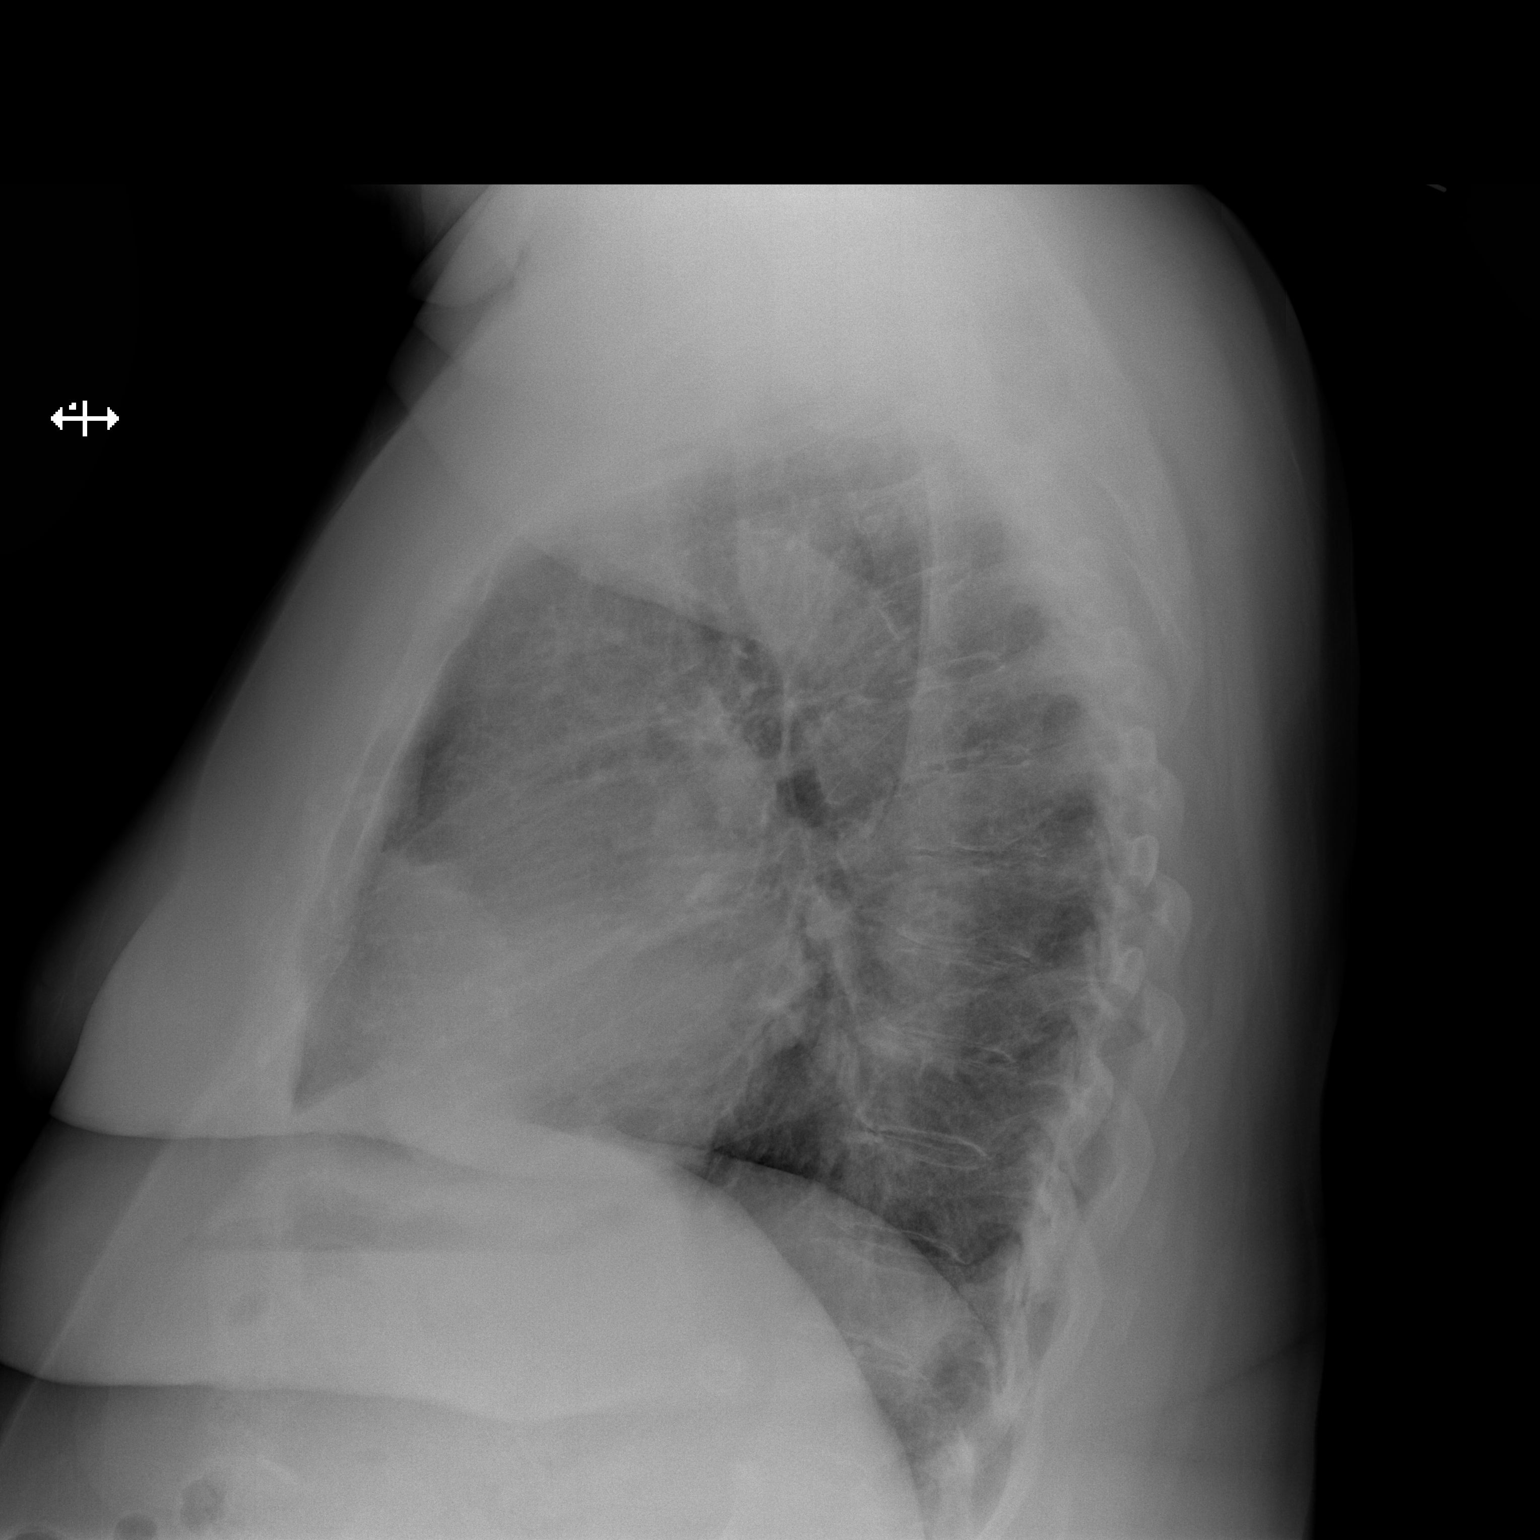

[2 of 2 positions shown; findings below may reference images not displayed]

FINDINGS: Cardiomegaly identified.

There is no evidence of focal airspace disease, pulmonary edema,
suspicious pulmonary nodule/mass, pleural effusion, or pneumothorax.
No acute bony abnormalities are identified.
IMPRESSION: Cardiomegaly without evidence of active cardiopulmonary disease.

## 2016-08-22 ENCOUNTER — Encounter: Payer: Self-pay | Admitting: Cardiovascular Disease

## 2016-08-22 ENCOUNTER — Encounter (INDEPENDENT_AMBULATORY_CARE_PROVIDER_SITE_OTHER): Payer: Self-pay

## 2016-08-22 ENCOUNTER — Ambulatory Visit (INDEPENDENT_AMBULATORY_CARE_PROVIDER_SITE_OTHER): Payer: Medicare Other | Admitting: Cardiovascular Disease

## 2016-08-22 VITALS — BP 120/76 | HR 54 | Ht 64.0 in | Wt 264.8 lb

## 2016-08-22 DIAGNOSIS — I5032 Chronic diastolic (congestive) heart failure: Secondary | ICD-10-CM

## 2016-08-22 DIAGNOSIS — I10 Essential (primary) hypertension: Secondary | ICD-10-CM

## 2016-08-22 NOTE — Progress Notes (Signed)
Cardiology Office Note   Date:  08/22/2016   ID:  [name], DOB [birth year], MRN [id number]  PCP:  Abigail Miyamoto, MD  Cardiologist:   [name], MD   Chief Complaint  Patient presents with  . Hypertension   1. Hypertension 2. Possible TIAs - mental status changes , difficulty talking.  3. Hypothyroidism 4. Hyperlipidemia 5.   History of Present Illness:  Samantha Stone is an 83 yo who I have known for many years. 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   Samantha Stone has fallen several times over the past several months. She slipped on the wet floor on the ceramic tile. She presents for further management of her HTN. Her BP has been irregular at home. Her diet is fairly consistent. She does not eat much salt. She has frozen lasagna on occasion. Hot dog once a month or so.   She was admitted to [facility] in March, 2015 for mental confusion and HTN Echo showed:  Left ventricle: The cavity size was normal. Wall thickness was at the upper limits of normal. Systolic function was normal. The estimated ejection fraction was in the range of 60% to 65%. Wall motion was normal; there were no regional wall motion abnormalities. Doppler parameters are consistent with abnormal left ventricular relaxation (grade 1 diastolic dysfunction). - Mitral valve: Mild regurgitation. - Left atrium: The atrium was moderately to severely dilated. - Pulmonary arteries: Systolic pressure was mildly increased. PA peak pressure: 19mm Hg    Oct. 20, 2015:  Samantha Stone is doing well. She had her cataracts done.  No CP or dyspnea.    Feb. 19, 2016:   Samantha Stone is a 83 y.o. female who presents for her HTN and hyperlipidemia. Family issues,  - Marden Noble was placed in a nursing home.  Step daughter challenged.  Has had a rough 3-4 months.   Has not been exerciseing, diet has not been as strict recently.   Oct. 9, 2017:  Samantha Stone is seen today for follow up visit Doing well Has had a  chronic cough.   Was diagnosed with CHF - she had an echo in 2015 that showed normal LV systolic function and grade 1 diastolic dysfunction   Has signficant DOE  - gets DOE walking to her mailbox  She was started on Lasix 10 mg a day but has not noticed any increase in her urine output.  Jan. 24, 2018:  Still has a cough.    Is now on Lasix 60 mg a day  Has been to ENT  Going to see Dr. Melvyn Novas soon .  BP has been elevated.   Past Medical History:  Diagnosis Date  . Arthritis   . Cancer ([place])    basal cell skin cancer  . Concussion   . Depression   . Headache   . Hypertension   . Hypothyroidism   . Stress incontinence     Past Surgical History:  Procedure Laterality Date  . ABDOMINAL HYSTERECTOMY    . APPENDECTOMY    . CARPAL TUNNEL RELEASE     rt x2  . COLONOSCOPY    . EYE SURGERY Bilateral    cataract surgery with lens implant  . FINGER AMPUTATION Right    little finger  . I&D EXTREMITY  03/11/2012   Procedure: MINOR IRRIGATION AND DEBRIDEMENT EXTREMITY;  Surgeon: Cammie Sickle., MD;  Location: [place];  Service: Orthopedics;  Laterality: Right;  Right little finger  . KNEE ARTHROSCOPY  bilat   . REPLACEMENT TOTAL KNEE BILATERAL    . TONSILLECTOMY    . TOTAL KNEE REVISION Right 03/14/2015   Procedure: TIBIAL REVISION, OPEN REDUCTION INTERNAL FIXATION PROXIMAL TIBIA;  Surgeon: Dorna Leitz, MD;  Location: [place];  Service: Orthopedics;  Laterality: Right;  . TRIGGER FINGER RELEASE    . TUBAL LIGATION       Current Outpatient Prescriptions  Medication Sig Dispense Refill  . Ascorbic Acid (VITAMIN C PO) Take 1 capsule by mouth daily.    Marland Kitchen aspirin EC 325 MG tablet Take 1 tablet (325 mg total) by mouth 2 (two) times daily after a meal. Take x 1 month post op to decrease risk of blood clots. 60 tablet 0  . atorvastatin (LIPITOR) 10 MG tablet Take 10 mg by mouth daily.    Marland Kitchen BIOTIN PO Take 1 tablet by mouth daily.    Marland Kitchen CALCIUM-VITAMIN D PO Take 1  tablet by mouth daily.    . citalopram (CELEXA) 20 MG tablet Take 20 mg by mouth daily.    . ferrous sulfate 325 (65 FE) MG tablet Take 325 mg by mouth 2 (two) times daily with a meal.     . FLUZONE HIGH-DOSE 0.5 ML SUSY Inject as directed once.    . furosemide (LASIX) 40 MG tablet Take 1 tablet (40 mg total) by mouth daily. 90 tablet 3  . levothyroxine (SYNTHROID, LEVOTHROID) 125 MCG tablet Take 125 mcg by mouth daily.    . Linaclotide (LINZESS) 145 MCG CAPS capsule Take 145 mcg by mouth daily as needed.    Marland Kitchen losartan (COZAAR) 50 MG tablet Take 50 mg by mouth daily.    . metoprolol succinate (TOPROL-XL) 100 MG 24 hr tablet Take 100 mg by mouth daily.    . Omega-3 Fatty Acids (FISH OIL PO) Take 1 capsule by mouth daily.     Marland Kitchen oxyCODONE-acetaminophen (PERCOCET/ROXICET) 5-325 MG per tablet Take 1 tablet by mouth every 4 (four) hours. And Percocet 5/325 mg 1 tab PO Q 4 hours PRN    . potassium chloride SA (K-DUR,KLOR-CON) 20 MEQ tablet Take 1 tablet (20 mEq total) by mouth daily. 90 tablet 3  . pravastatin (PRAVACHOL) 10 MG tablet Take 10 mg by mouth daily.    Marland Kitchen PROAIR HFA 108 (90 Base) MCG/ACT inhaler Inhale 2 puffs into the lungs every 4 (four) hours as needed. AS NEEDED FOR WHEEZING OR SOB    . sennosides-docusate sodium (SENOKOT-S) 8.6-50 MG tablet Take 2 tablets by mouth 2 (two) times daily.    . traZODone (DESYREL) 50 MG tablet Take 50 mg by mouth at bedtime.     No current facility-administered medications for this visit.     Allergies:   Phenergan [promethazine hcl]    Social History:  The patient  reports that she has never smoked. She has never used smokeless tobacco. She reports that she drinks about 8.4 oz of alcohol per week . She reports that she does not use drugs.   Family History:  The patient's family history includes Bladder Cancer in her father.    ROS:  Please see the history of present illness.    Review of Systems: Constitutional:  denies fever, chills,  diaphoresis, appetite change and fatigue.  HEENT: denies photophobia, eye pain, redness, hearing loss, ear pain, congestion, sore throat, rhinorrhea, sneezing, neck pain, neck stiffness and tinnitus.  Respiratory: denies SOB, DOE, cough, chest tightness, and wheezing.  Cardiovascular: denies chest pain, palpitations and leg swelling.  Gastrointestinal: denies  nausea, vomiting, abdominal pain, diarrhea, constipation, blood in stool.  Genitourinary: denies dysuria, urgency, frequency, hematuria, flank pain and difficulty urinating.  Musculoskeletal: denies  myalgias, back pain, joint swelling, arthralgias and gait problem.   Skin: denies pallor, rash and wound.  Neurological: denies dizziness, seizures, syncope, weakness, light-headedness, numbness and headaches.   Hematological: denies adenopathy, easy bruising, personal or family bleeding history.  Psychiatric/ Behavioral: denies suicidal ideation, mood changes, confusion, nervousness, sleep disturbance and agitation.       All other systems are reviewed and negative.    PHYSICAL EXAM: VS:  BP 120/76 (BP Location: Left Arm, Patient Position: Sitting, Cuff Size: Large)   Pulse (!) 54   Ht 5\' 4"  (1.626 m)   Wt 264 lb 12.8 oz (120.1 kg)   BMI 45.45 kg/m  , BMI Body mass index is 45.45 kg/m. GEN: Well nourished, well developed, in no acute distress  HEENT: normal  Neck: no JVD, carotid bruits, or masses Cardiac: RRR; no murmurs, rubs, or gallops,no edema  Respiratory:  clear to auscultation bilaterally, normal work of breathing GI: soft, nontender, nondistended, + BS MS: no deformity or atrophy  Skin: warm and dry, no rash Neuro:  Strength and sensation are intact Psych: normal   EKG:  EKG is ordered today.  Aug 22, 2016:   Sinus brady at 48.  No ST or T wave changes.     Recent Labs: 09/18/2015: ALT 21; Hemoglobin 12.2; Platelets 280 05/21/2016: BUN 16; Creat 0.92; Potassium 4.6; Sodium 137    Lipid Panel    Component  Value Date/Time   CHOL 154 06/16/2014 0429   TRIG 59 06/16/2014 0429   HDL 55 06/16/2014 0429   CHOLHDL 2.8 06/16/2014 0429   VLDL 12 06/16/2014 0429   LDLCALC 87 06/16/2014 0429      Wt Readings from Last 3 Encounters:  08/22/16 264 lb 12.8 oz (120.1 kg)  05/07/16 264 lb 1.9 oz (119.8 kg)  09/18/15 256 lb (116.1 kg)      Other studies Reviewed: Additional studies/ records that were reviewed today include: . Review of the above records demonstrates:    ASSESSMENT AND PLAN:  1. Hypertension - BP is doing well.  She needs to get back into her exercise and pay more attention to her diet.  2. Possible TIAs - mental status changes , difficulty talking.   3. Hypothyroidism  4. Hyperlipidemia - will check lipids in 6 months .   5.  DOE, cough / chronic diastolic CHF She is better on Lasix 60 mg a day. As a trial she will increase her Lasix to 60 mg twice a day for the next 2 or 3 days. She'll let us know if the cough improves with this.  She will be seeing Dr. Melvyn Novas in the near future. He will also want that information.  Her blood pressure is well-controlled. Continue with her current medications otherwise.   Current medicines are reviewed at length with the patient today.  The patient does not have concerns regarding medicines.  The following changes have been made:  no change     Signed, [name], MD  08/22/2016 11:54 AM    [facility], [place], [place]  [address] Phone: [phone]; Fax: [phone]

## 2016-08-22 NOTE — Patient Instructions (Addendum)
Medication Instructions:  You may try Lasix 60 mg twice daily for 3 days to see if if helps with your cough   Labwork: Your physician recommends that you return for lab work (basic metabolic panel) in: 6 months at next office visit   Testing/Procedures: None Ordered   Follow-Up: Your physician wants you to follow-up in: 6 months with Dr. Acie Fredrickson.  You will receive a reminder letter in the mail two months in advance. If you don't receive a letter, please call our office to schedule the follow-up appointment.   If you need a refill on your cardiac medications before your next appointment, please call your pharmacy.   Thank you for choosing [facility]! Christen Bame, RN [phone]    Heart Failure Heart failure means your heart has trouble pumping blood. This makes it hard for your body to work well. Heart failure is usually a long-term (chronic) condition. You must take good care of yourself and follow your doctor's treatment plan. HOME CARE  Take your heart medicine as told by your doctor.  Do not stop taking medicine unless your doctor tells you to.  Do not skip any dose of medicine.  Refill your medicines before they run out.  Take other medicines only as told by your doctor or pharmacist.  Stay active if told by your doctor. The elderly and people with severe heart failure should talk with a doctor about physical activity.  Eat heart-healthy foods. Choose foods that are without trans fat and are low in saturated fat, cholesterol, and salt (sodium). This includes fresh or frozen fruits and vegetables, fish, lean meats, fat-free or low-fat dairy foods, whole grains, and high-fiber foods. Lentils and dried peas and beans (legumes) are also good choices.  Limit salt if told by your doctor.  Cook in a healthy way. Roast, grill, broil, bake, poach, steam, or stir-fry foods.  Limit fluids as told by your doctor.  Weigh yourself every morning. Do this after you pee  (urinate) and before you eat breakfast. Write down your weight to give to your doctor.  Take your blood pressure and write it down if your doctor tells you to.  Ask your doctor how to check your pulse. Check your pulse as told.  Lose weight if told by your doctor.  Stop smoking or chewing tobacco. Do not use gum or patches that help you quit without your doctor's approval.  Schedule and go to doctor visits as told.  Nonpregnant women should have no more than 1 drink a day. Men should have no more than 2 drinks a day. Talk to your doctor about drinking alcohol.  Stop illegal drug use.  Stay current with shots (immunizations).  Manage your health conditions as told by your doctor.  Learn to manage your stress.  Rest when you are tired.  If it is really hot outside:  Avoid intense activities.  Use air conditioning or fans, or get in a cooler place.  Avoid caffeine and alcohol.  Wear loose-fitting, lightweight, and light-colored clothing.  If it is really cold outside:  Avoid intense activities.  Layer your clothing.  Wear mittens or gloves, a hat, and a scarf when going outside.  Avoid alcohol.  Learn about heart failure and get support as needed.  Get help to maintain or improve your quality of life and your ability to care for yourself as needed. GET HELP IF:   You gain weight quickly.  You are more short of breath than usual.  You cannot  do your normal activities.  You tire easily.  You cough more than normal, especially with activity.  You have any or more puffiness (swelling) in areas such as your hands, feet, ankles, or belly (abdomen).  You cannot sleep because it is hard to breathe.  You feel like your heart is beating fast (palpitations).  You get dizzy or light-headed when you stand up. GET HELP RIGHT AWAY IF:   You have trouble breathing.  There is a change in mental status, such as becoming less alert or not being able to focus.  You  have chest pain or discomfort.  You faint. MAKE SURE YOU:   Understand these instructions.  Will watch your condition.  Will get help right away if you are not doing well or get worse. This information is not intended to replace advice given to you by your health care provider. Make sure you discuss any questions you have with your health care provider. Document Released: 04/24/2008 Document Revised: 08/06/2014 Document Reviewed: 09/01/2012 Elsevier Interactive Patient Education  2017 [employer].

## 2016-08-27 ENCOUNTER — Encounter: Payer: Self-pay | Admitting: Internal Medicine

## 2016-08-27 ENCOUNTER — Ambulatory Visit (INDEPENDENT_AMBULATORY_CARE_PROVIDER_SITE_OTHER): Payer: Medicare Other | Admitting: Internal Medicine

## 2016-08-27 ENCOUNTER — Other Ambulatory Visit (INDEPENDENT_AMBULATORY_CARE_PROVIDER_SITE_OTHER): Payer: Medicare Other

## 2016-08-27 VITALS — BP 140/68 | HR 74 | Ht 64.0 in | Wt 266.0 lb

## 2016-08-27 DIAGNOSIS — R0609 Other forms of dyspnea: Secondary | ICD-10-CM

## 2016-08-27 DIAGNOSIS — I10 Essential (primary) hypertension: Secondary | ICD-10-CM | POA: Diagnosis not present

## 2016-08-27 DIAGNOSIS — R05 Cough: Secondary | ICD-10-CM

## 2016-08-27 DIAGNOSIS — R0600 Dyspnea, unspecified: Secondary | ICD-10-CM

## 2016-08-27 DIAGNOSIS — R059 Cough, unspecified: Secondary | ICD-10-CM

## 2016-08-27 DIAGNOSIS — J45991 Cough variant asthma: Secondary | ICD-10-CM | POA: Insufficient documentation

## 2016-08-27 LAB — CBC WITH DIFFERENTIAL/PLATELET
Basophils Absolute: 0 10*3/uL (ref 0.0–0.1)
Basophils Relative: 0.6 % (ref 0.0–3.0)
Eosinophils Absolute: 0.4 10*3/uL (ref 0.0–0.7)
Eosinophils Relative: 5.1 % — ABNORMAL HIGH (ref 0.0–5.0)
HCT: 39.9 % (ref 36.0–46.0)
Hemoglobin: 13.3 g/dL (ref 12.0–15.0)
Lymphocytes Relative: 18.7 % (ref 12.0–46.0)
Lymphs Abs: 1.6 10*3/uL (ref 0.7–4.0)
MCHC: 33.3 g/dL (ref 30.0–36.0)
MCV: 87.9 fl (ref 78.0–100.0)
Monocytes Absolute: 1.1 10*3/uL — ABNORMAL HIGH (ref 0.1–1.0)
Monocytes Relative: 12.6 % — ABNORMAL HIGH (ref 3.0–12.0)
Neutro Abs: 5.5 10*3/uL (ref 1.4–7.7)
Neutrophils Relative %: 63 % (ref 43.0–77.0)
Platelets: 285 10*3/uL (ref 150.0–400.0)
RBC: 4.54 Mil/uL (ref 3.87–5.11)
RDW: 14.1 % (ref 11.5–15.5)
WBC: 8.7 10*3/uL (ref 4.0–10.5)

## 2016-08-27 LAB — NITRIC OXIDE: Nitric Oxide: 49

## 2016-08-27 MED ORDER — PREDNISONE 10 MG PO TABS: ORAL_TABLET | ORAL | 0 refills | Status: DC

## 2016-08-27 MED ORDER — AMOXICILLIN-POT CLAVULANATE 875-125 MG PO TABS: 1.0000 | ORAL_TABLET | Freq: Two times a day (BID) | ORAL | 0 refills | Status: AC

## 2016-08-27 MED ORDER — PANTOPRAZOLE SODIUM 40 MG PO TBEC: 40.0000 mg | DELAYED_RELEASE_TABLET | Freq: Every day | ORAL | 2 refills | Status: DC

## 2016-08-27 MED ORDER — FAMOTIDINE 20 MG PO TABS: ORAL_TABLET | ORAL | 2 refills | Status: DC

## 2016-08-27 MED ORDER — BISOPROLOL FUMARATE 5 MG PO TABS: 5.0000 mg | ORAL_TABLET | Freq: Every day | ORAL | 11 refills | Status: DC

## 2016-08-27 NOTE — Progress Notes (Signed)
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 Pulmonary Consult    Referred by Dr. Brandt Loosen.  Pt c/o cough for the past year.  Cough is worse late in the evenings. She is coughing up green sputum.    no prior h/o cough/seasonal allergies/ asthma or sinus problem with new indolent  onset of cough winter 2017 persistent since then  esp before supper while sitting watching TV in recliner and slt green mucus poduction x 6 m and mild sob no better on inhalers and ent eval by [facility] no dx and p trazadone at hs sleeps fine and best time of day is am then gradually worse as day goes on - no better on augmentin - worse on mint > severe cough to point of choking  No obvious day to day or daytime variability or assoc   mucus plugs or hemoptysis or cp or chest tightness, subjective wheeze or overt sinus or hb symptoms. No unusual exp hx or h/o childhood pna/ asthma or knowledge of premature birth.  Sleeping ok without nocturnal  or early am exacerbation  of respiratory  c/o's or need for noct saba. Also denies any obvious fluctuation of symptoms with weather or environmental changes or other aggravating or alleviating factors except as outlined above   Current Medications, Allergies, Complete Past Medical History, Past Surgical History, Family History, and Social History were reviewed in [employer] record.  ROS  The following are not active complaints unless bolded sore throat, dysphagia, dental problems, itching, sneezing,  nasal congestion or excess/ purulent secretions, ear ache,   fever, chills, sweats, unintended wt loss, classically pleuritic or exertional cp,  orthopnea pnd or leg swelling,  presyncope, palpitations, abdominal pain, anorexia, nausea, vomiting, diarrhea  or change in bowel or bladder habits, change in stools or urine, dysuria,hematuria,  rash, arthralgias, visual complaints, headache, numbness, weakness or ataxia or problems with walking or coordination,  change in mood/affect or memory.            Review of Systems     Objective:   Physical Exam  amb obese wf nad  Wt Readings from Last 3 Encounters:  08/27/16 266 lb (120.7 kg)  08/22/16 264 lb 12.8 oz (120.1 kg)  05/07/16 264 lb 1.9 oz (119.8 kg)    Vital signs reviewed  .- Note on arrival 02 sats  95% on RA       HEENT: nl dentition, turbinates, and oropharynx. Nl external ear canals without cough reflex   NECK :  without JVD/Nodes/TM/ nl carotid upstrokes bilaterally   LUNGS: no acc muscle use,  Nl contour chest which is clear to A and P bilaterally without cough on insp or exp maneuvers   CV:  RRR  no s3 or murmur or increase in P2, nad no edema   ABD:  soft and nontender with nl inspiratory excursion in the supine position. No bruits or organomegaly appreciated, bowel sounds nl  MS:  Nl gait/ ext warm without deformities, calf tenderness, cyanosis or clubbing No obvious joint restrictions   SKIN: warm and dry without lesions    NEURO:  alert, approp, nl sensorium with  no  motor or cerebellar deficits apparent.     I personally reviewed images and agree with radiology impression as follows:  CTchest s contrast 08/09/16 No lung edema or consolidation.  No pleural effusion. No adenopathy. Small hiatal hernia. Thickening of the esophageal wall in the upper to mid esophagus. Question reflux esophagitis. Note that the right subclavian artery arises from the aortic arch distal to the left subclavian artery and crosses behind the esophagus to extend to the right side. The right subclavian artery does impress upon the esophagus from posterior aspect and may contribute to the esophageal  thickening in this general area. Areas of atherosclerotic calcification noted. There are foci of coronary artery calcification. Calcified splenic artery aneurysm is of questionable clinical significance in this age group. Cholelithiasis. Benign adrenal adenomas bilaterally. Extensive arthropathy right shoulder.   Labs ordered 08/27/2016    Allergy profile     Assessment:

## 2016-08-27 NOTE — Assessment & Plan Note (Signed)
Strongly prefer in this setting: Bystolic, the most beta -1  selective Beta blocker available in sample form, with bisoprolol the most selective generic choice  on the market.   Try bisoprolol 5 mg twice daily

## 2016-08-27 NOTE — Patient Instructions (Addendum)
GERD (REFLUX)  is an extremely common cause of respiratory symptoms just like yours , many times with no obvious heartburn at all.    It can be treated with medication, but also with lifestyle changes including elevation of the head of your bed (ideally with 6 inch  bed blocks),  Smoking cessation, avoidance of late meals, excessive alcohol, and avoid fatty foods, chocolate, peppermint, colas, red wine, and acidic juices such as orange juice.  NO MINT OR MENTHOL PRODUCTS SO NO COUGH DROPS   USE SUGARLESS CANDY INSTEAD (Jolley ranchers or Stover's or Life Savers) or even ice chips will also do - the key is to swallow to prevent all throat clearing. NO OIL BASED VITAMINS - use powdered substitutes - STOP FISH OIL  Pantoprazole (protonix) 40 mg   Take  30-60 min before first meal of the day and Pepcid (famotidine)  20 mg one @  bedtime until return to office - this is the best way to tell whether stomach acid is contributing to your problem.  Augmentin 875 mg take one pill twice daily  X 10 days - take at breakfast and supper with large glass of water.  It would help reduce the usual side effects (diarrhea and yeast infections) if you ate cultured yogurt at lunch.   Prednisone 10 mg take  4 each am x 2 days,   2 each am x 2 days,  1 each am x 2 days and stop   Stop lopressor and start bisoprolol 5 mg twice daily     Please remember to go to the lab   department downstairs in the basement  for your tests - we will call you with the results when they are available.  Please schedule a follow up office visit in 2 weeks, sooner if needed

## 2016-08-28 LAB — RESPIRATORY ALLERGY PROFILE REGION II ~~LOC~~
Allergen, A. alternata, m6: <0.1 kU/L
Allergen, C. Herbarum, M2: <0.1 kU/L
Allergen, Cedar tree, t12: <0.1 kU/L
Allergen, Comm Silver Birch, t9: <0.1 kU/L
Allergen, Cottonwood, t14: <0.1 kU/L
Allergen, D pternoyssinus,d7: 2.66 kU/L — ABNORMAL HIGH
Allergen, Mouse Urine Protein, e78: <0.1 kU/L
Allergen, Mulberry, t76: <0.1 kU/L
Allergen, Oak,t7: <0.1 kU/L
Allergen, P. notatum, m1: <0.1 kU/L
Aspergillus fumigatus, m3: <0.1 kU/L
Bermuda Grass: <0.1 kU/L
Box Elder IgE: <0.1 kU/L
Cat Dander: <0.1 kU/L
Cockroach: <0.1 kU/L
Common Ragweed: <0.1 kU/L
D. farinae: 1.99 kU/L — ABNORMAL HIGH
Dog Dander: <0.1 kU/L
Elm IgE: <0.1 kU/L
IgE (Immunoglobulin E), Serum: 11 kU/L (ref <115)
Johnson Grass: <0.1 kU/L
Pecan/Hickory Tree IgE: <0.1 kU/L
Rough Pigweed  IgE: <0.1 kU/L
Sheep Sorrel IgE: <0.1 kU/L
Timothy Grass: <0.1 kU/L

## 2016-08-28 NOTE — Assessment & Plan Note (Signed)
Body mass index is 45.66   Lab Results  Component Value Date   TSH 0.321 (L) 06/16/2014     Contributing to gerd tendency/ doe/reviewed the need and the process to achieve and maintain neg calorie balance > defer f/u primary care including intermittently monitoring thyroid status

## 2016-08-28 NOTE — Assessment & Plan Note (Signed)
Echo 06/06/2016 Left ventricle: The cavity size was normal. Wall thickness was   increased in a pattern of mild LVH. There was moderate focal   basal hypertrophy of the septum. Systolic function was normal.   The estimated ejection fraction was in the range of 60% to 65%.   Doppler parameters are consistent with abnormal left ventricular   relaxation (grade 1 diastolic dysfunction). - Mitral valve: Calcified annulus. Mildly thickened leaflets .   There was mild regurgitation. - Left atrium: The atrium was moderately dilated. - Right atrium: The atrium was mildly dilated. - Pulmonary arteries: PA peak pressure: 38 mm Hg (S).  DOE > Probably on basis of diastolic dysfunction / obesity s evidence of asthma at present though certainly could have pseudoasthma component related to gerd - see uacs (see separate a/p)

## 2016-08-28 NOTE — Assessment & Plan Note (Signed)
Allergy profile 08/27/2016 >  Eos 0.4 /  IgE   - FENO 08/27/2016  =   49   The most common causes of chronic cough in immunocompetent adults include the following: upper airway cough syndrome (UACS), previously referred to as postnasal drip syndrome (PNDS), which is caused by variety of rhinosinus conditions; (2) asthma; (3) GERD; (4) chronic bronchitis from cigarette smoking or other inhaled environmental irritants; (5) nonasthmatic eosinophilic bronchitis; and (6) bronchiectasis.   These conditions, singly or in combination, have accounted for up to 94% of the causes of chronic cough in prospective studies.   Other conditions have constituted no >6% of the causes in prospective studies These have included bronchogenic carcinoma, chronic interstitial pneumonia, sarcoidosis, left ventricular failure, ACEI-induced cough, and aspiration from a condition associated with pharyngeal dysfunction.    Chronic cough is often simultaneously caused by more than one condition. A single cause has been found from 38 to 82% of the time, multiple causes from 18 to 62%. Multiply caused cough has been the result of three diseases up to 42% of the time.       This is either cough variant asthma or more likely Upper airway cough syndrome (previously labeled PNDS) , is  so named because it's frequently impossible to sort out how much is  CR/sinusitis with freq throat clearing (which can be related to primary GERD)   vs  causing  secondary (" extra esophageal")  GERD from wide swings in gastric pressure that occur with throat clearing, often  promoting self use of mint and menthol lozenges that reduce the lower esophageal sphincter tone and exacerbate the problem further in a cyclical fashion.   These are the same pts (now being labeled as having "irritable larynx syndrome" by some cough centers) who not infrequently have a history of having failed to tolerate ace inhibitors,  dry powder inhalers or biphosphonates or report  having atypical/extraesophageal reflux symptoms that don't respond to standard doses of PPI  and are easily confused as having aecopd or asthma flares by even experienced allergists/ pulmonologists (myself included).   rec try again to rx a sinus dz with augmentin/ eliminate gerd to the extent possible and one short course of pred while off high dose lopressor then regroup in 2 weeks > may need methacholine challenge to sort out   Total time devoted to counseling  > 50 % of new pt 12 m office visit:  review case with pt/ discussion of options/alternatives/ personally creating written customized instructions  in presence of pt  then going over those specific  Instructions directly with the pt including how to use all of the meds but in particular covering each new medication in detail and the difference between the maintenance/automatic meds and the prns using an action plan format for the latter.  Please see AVS from this visit for a full list of these instructions which I personally wrote for this pt and  are unique to this visit.

## 2016-08-29 ENCOUNTER — Telehealth: Payer: Self-pay | Admitting: Internal Medicine

## 2016-08-29 NOTE — Progress Notes (Signed)
LMTCB

## 2016-08-29 NOTE — Telephone Encounter (Signed)
Notes Recorded by Tanda Rockers, MD on 08/29/2016 at 8:50 AM EST Call patient : Studies are unremarkable x mild allergy to dust> avoidance  Pt aware of results and voiced her understanding. Nothing further needed.

## 2016-09-11 ENCOUNTER — Encounter: Payer: Self-pay | Admitting: Internal Medicine

## 2016-09-11 ENCOUNTER — Ambulatory Visit (INDEPENDENT_AMBULATORY_CARE_PROVIDER_SITE_OTHER): Payer: Medicare Other | Admitting: Internal Medicine

## 2016-09-11 VITALS — BP 122/68 | HR 67 | Wt 266.0 lb

## 2016-09-11 DIAGNOSIS — I10 Essential (primary) hypertension: Secondary | ICD-10-CM | POA: Diagnosis not present

## 2016-09-11 DIAGNOSIS — R05 Cough: Secondary | ICD-10-CM | POA: Diagnosis not present

## 2016-09-11 DIAGNOSIS — R059 Cough, unspecified: Secondary | ICD-10-CM

## 2016-09-11 LAB — NITRIC OXIDE: Nitric Oxide: 31

## 2016-09-11 NOTE — Assessment & Plan Note (Signed)
Trial off lopressor due to exp wheeze/cough > resolved 09/11/2016 on bisoprolol   Although not clearly related, until the cough is eliminated completely or MCT neg I Strongly prefer in this setting: Bystolic, the most beta -1  selective Beta blocker available in sample form, with bisoprolol the most selective generic choice  on the market.   Doing fine on bisoprolol 5 mg daily > continue

## 2016-09-11 NOTE — Progress Notes (Signed)
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 Pulmonary Consult    Referred by Dr. Brandt Loosen.  Pt c/o cough for the past year.  Cough is worse late in the evenings. She is coughing up green sputum.    no prior h/o cough/seasonal allergies/ asthma or sinus problem with new indolent  onset of cough winter 2017 persistent since then  esp before supper while sitting watching TV in recliner and slt green mucus poduction x 6 m and mild sob no better on inhalers and ent eval by [facility] no dx and p trazadone at hs sleeps fine and best time of day is am then gradually worse as day goes on - no better on augmentin - worse on mint > severe cough to point of choking rec GERD  Diet  Pantoprazole (protonix) 40 mg   Take  30-60 min before first meal of the day and Pepcid (famotidine)  20 mg one @  bedtime until return to office - this is the best way to tell whether stomach acid is contributing to your problem. Augmentin 875 mg take one pill twice daily  X 10 days - take at breakfast and supper with large glass of water.  It would help reduce the usual side effects (diarrhea and yeast infections) if you ate cultured yogurt at lunch.  Prednisone 10 mg take  4 each am x 2 days,   2 each am x 2 days,  1 each am x 2 days and stop  Stop lopressor and start bisoprolol 5 mg twice daily        09/11/2016  f/u ov/Samantha Stone re: cough x winter of 2017 / hbp on new meds  Chief Complaint  Patient presents with  . Follow-up    Cough has improved some, but has not completely resolved.  She still has occ green to clear sputum production.   says about 75% better, min discolored mucus and daytime only,  never noct or early am and no assoc sob    No obvious day to day or daytime variability or assoc production of mucus plugs or hemoptysis or cp or chest tightness, subjective wheeze or overt sinus or hb symptoms. No unusual exp hx or h/o childhood pna/ asthma or knowledge of premature birth.  Sleeping ok without nocturnal  or early am exacerbation  of respiratory  c/o's or need for noct saba. Also denies any obvious fluctuation of symptoms with weather or environmental changes or other aggravating or alleviating factors except as outlined above   Current Medications, Allergies, Complete Past Medical History, Past Surgical History, Family History, and Social History were reviewed in [employer] record.  ROS  The following are not active complaints unless bolded sore throat, dysphagia, dental problems, itching, sneezing,  nasal congestion or excess/ purulent secretions, ear ache,   fever, chills, sweats, unintended wt loss, classically pleuritic or exertional cp,  orthopnea pnd or leg swelling, presyncope, palpitations, abdominal pain, anorexia, nausea, vomiting, diarrhea  or change in bowel or bladder habits, change in stools or urine, dysuria,hematuria,  rash, arthralgias, visual complaints, headache, numbness, weakness or ataxia or problems  with walking or coordination,  change in mood/affect or memory.               Objective:   Physical Exam  amb obese wf nad  09/11/2016       266   08/27/16 266 lb (120.7 kg)  08/22/16 264 lb 12.8 oz (120.1 kg)  05/07/16 264 lb 1.9 oz (119.8 kg)    Vital signs reviewed  .- Note on arrival 02 sats  98% on RA       HEENT: nl dentition, turbinates, and oropharynx. Nl external ear canals without cough reflex   NECK :  without JVD/Nodes/TM/ nl carotid upstrokes bilaterally   LUNGS: no acc muscle use,  Nl contour chest which is clear to A and P bilaterally with cough on insp  maneuvers   CV:  RRR  no s3 or murmur or increase in  P2, nad no edema   ABD:  soft and nontender with nl inspiratory excursion in the supine position. No bruits or organomegaly appreciated, bowel sounds nl  MS:  Nl gait/ ext warm without deformities, calf tenderness, cyanosis or clubbing No obvious joint restrictions   SKIN: warm and dry without lesions    NEURO:  alert, approp, nl sensorium with  no motor or cerebellar deficits apparent.      I personally reviewed images and agree with radiology impression as follows:  CTchest s contrast 08/09/16 No lung edema or consolidation.  No pleural effusion. No adenopathy. Small hiatal hernia. Thickening of the esophageal wall in the upper to mid esophagus. Question reflux esophagitis. Note that the right subclavian artery arises from the aortic arch distal to the left subclavian artery and crosses behind the esophagus to extend to the right side. The right subclavian artery does impress upon the esophagus from posterior aspect and may contribute to the esophageal thickening in this general area. Areas of atherosclerotic calcification noted. There are foci of coronary artery calcification. Calcified splenic artery aneurysm is of questionable clinical significance in this age group. Cholelithiasis. Benign adrenal adenomas bilaterally. Extensive arthropathy right shoulder.        Assessment:

## 2016-09-11 NOTE — Patient Instructions (Addendum)
Stay on pantoprazole before bfast and pepcid at bedtime unitl return   Stay on the diet   Ok to try Zyrtec either at bedtime or in am but may make you sleepy but if can't tolerate it get the clariton   If cough worsens call [facility] at [phone] to schedule sinus CT before next visit  Please schedule a follow up office visit in 6 weeks, call sooner if needed

## 2016-09-11 NOTE — Assessment & Plan Note (Signed)
Body mass index is 45.66  .  trending - no change  Lab Results  Component Value Date   TSH 0.321 (L) 06/16/2014     Contributing to gerd risk/ doe/reviewed the need and the process to achieve and maintain neg calorie balance > defer f/u primary care including intermittently monitoring thyroid status

## 2016-09-11 NOTE — Assessment & Plan Note (Addendum)
Allergy profile 08/27/2016 >  Eos 0.4 /  IgE 11 Rast Pos dust only   - FENO 08/27/2016  =   49  Pre rx  rx with pred x 6 days and max gerd rx > 75% improvement and no worse off pred - FENO 09/11/2016  =   31 on no ICS - 09/11/2016 rec add zyrtec hs   feno remains indeterminate for asthma but given lack of noct or early am cough still strongly favor here:  Upper airway cough syndrome (previously labeled PNDS) , is  so named because it's frequently impossible to sort out how much is  CR/sinusitis with freq throat clearing (which can be related to primary GERD)   vs  causing  secondary (" extra esophageal")  GERD from wide swings in gastric pressure that occur with throat clearing, often  promoting self use of mint and menthol lozenges that reduce the lower esophageal sphincter tone and exacerbate the problem further in a cyclical fashion.   These are the same pts (now being labeled as having "irritable larynx syndrome" by some cough centers) who not infrequently have a history of having failed to tolerate ace inhibitors,  dry powder inhalers or biphosphonates or report having atypical/extraesophageal reflux symptoms that don't respond to standard doses of PPI  and are easily confused as having aecopd or asthma flares by even experienced allergists/ pulmonologists (myself included).   rec continue max gerd rx/ hold off further steroids and consider sinus CT then methacholine challenge as next steps if flare on gerd rx plus prn zyrtec for pnds  I had an extended discussion with the patient reviewing all relevant studies completed to date and  lasting 15 to 20 minutes of a 25 minute visit    Each maintenance medication was reviewed in detail including most importantly the difference between maintenance and prns and under what circumstances the prns are to be triggered using an action plan format that is not reflected in the computer generated alphabetically organized AVS.    Please see AVS for specific  instructions unique to this visit that I personally wrote and verbalized to the the pt in detail and then reviewed with pt  by my nurse highlighting any  changes in therapy recommended at today's visit to their plan of care.

## 2016-09-21 ENCOUNTER — Other Ambulatory Visit: Payer: Self-pay | Admitting: Internal Medicine

## 2016-09-21 DIAGNOSIS — R05 Cough: Secondary | ICD-10-CM

## 2016-09-21 DIAGNOSIS — R059 Cough, unspecified: Secondary | ICD-10-CM

## 2016-10-23 ENCOUNTER — Ambulatory Visit (INDEPENDENT_AMBULATORY_CARE_PROVIDER_SITE_OTHER): Payer: Medicare Other | Admitting: Internal Medicine

## 2016-10-23 ENCOUNTER — Encounter: Payer: Self-pay | Admitting: Internal Medicine

## 2016-10-23 VITALS — BP 116/72 | HR 60 | Ht 64.0 in | Wt 257.0 lb

## 2016-10-23 DIAGNOSIS — I10 Essential (primary) hypertension: Secondary | ICD-10-CM | POA: Diagnosis not present

## 2016-10-23 DIAGNOSIS — J45991 Cough variant asthma: Secondary | ICD-10-CM | POA: Diagnosis not present

## 2016-10-23 MED ORDER — BUDESONIDE-FORMOTEROL FUMARATE 80-4.5 MCG/ACT IN AERO: 2.0000 | INHALATION_SPRAY | Freq: Two times a day (BID) | RESPIRATORY_TRACT | 0 refills | Status: DC

## 2016-10-23 NOTE — Assessment & Plan Note (Signed)
Allergy profile 08/27/2016 >  Eos 0.4 /  IgE 11 Rast Pos dust only   - FENO 08/27/2016  =   49  Pre rx  rx with pred x 6 days and max gerd rx > 75% improvement and no worse off pred - FENO 09/11/2016  =   31 on no ICS - 09/11/2016 rec add zyrtec hs  - 10/23/2016  After extensive coaching HFA effectiveness =    50% try symb 80 2bid  - Sinus CT 10/23/2016 >>>    Lack of cough resolution on a verified empirical regimen could mean an alternative diagnosis eg cough variant asthma, persistence of the disease state (eg sinusitis or bronchiectasis) , or inadequacy of currently available therapy (eg no medical rx available for non-acid gerd which is prone to based on hx of worse cough p supper and ct evidence of gerd)    rec proceed with empirical trial for cough variant asthma with symb 80 2bid while again emphasizing max gerd rx and proceeding with sinus CT next and if not better then consider trial of gabapentin next vs MCT    I had an extended discussion with the patient reviewing all relevant studies completed to date and  lasting 15 to 20 minutes of a 25 minute visit    Each maintenance medication was reviewed in detail including most importantly the difference between maintenance and prns and under what circumstances the prns are to be triggered using an action plan format that is not reflected in the computer generated alphabetically organized AVS.    Please see AVS for specific instructions unique to this visit that I personally wrote and verbalized to the the pt in detail and then reviewed with pt  by my nurse highlighting any  changes in therapy recommended at today's visit to their plan of care.

## 2016-10-23 NOTE — Progress Notes (Signed)
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 Pulmonary Consult    Referred by Dr. Brandt Loosen.  Pt c/o cough for the past year.  Cough is worse late in the evenings. She is coughing up green sputum.    no prior h/o cough/seasonal allergies/ asthma or sinus problem with new indolent  onset of cough winter 2017 persistent since then  esp before supper while sitting watching TV in recliner and slt green mucus poduction x 6 m and mild sob no better on inhalers and ent eval by [facility] no dx and p trazadone at hs sleeps fine and best time of day is am then gradually worse as day goes on - no better on augmentin - worse on mint > severe cough to point of choking rec GERD  Diet  Pantoprazole (protonix) 40 mg   Take  30-60 min before first meal of the day and Pepcid (famotidine)  20 mg one @  bedtime until return to office - this is the best way to tell whether stomach acid is contributing to your problem. Augmentin 875 mg take one pill twice daily  X 10 days - take at breakfast and supper with large glass of water.  It would help reduce the usual side effects (diarrhea and yeast infections) if you ate cultured yogurt at lunch.  Prednisone 10 mg take  4 each am x 2 days,   2 each am x 2 days,  1 each am x 2 days and stop  Stop lopressor and start bisoprolol 5 mg twice daily        09/11/2016  f/u ov/Samantha Stone re: cough x winter of 2017 / hbp on new meds  Chief Complaint  Patient presents with  . Follow-up    Cough has improved some, but has not completely resolved.  She still has occ green to clear sputum production.   says about 75% better, min discolored mucus and daytime only,  never noct or early am and no assoc sob  rec Stay on pantoprazole before bfast and pepcid at bedtime unitl return  Stay on the diet  Ok to try Zyrtec either at bedtime or in am but may make you sleepy but if can't tolerate it get the clariton  If cough worsens call [facility] at [phone] to schedule sinus CT before next visit    10/23/2016  f/u ov/Samantha Stone re:  Cough ? Etiology x winter 2017 on ppi qam/ h2 hs  Chief Complaint  Patient presents with  . Follow-up    f/u for chronic cough. Cough has improved slightly since last visit.   cough is worse p supper, congested sounding but not much mucus, worse on deep inspiration   No obvious day to day or daytime variability or assoc sob or excess/ purulent sputum or mucus plugs or hemoptysis or cp or chest tightness, subjective wheeze or overt sinus or hb symptoms. No unusual exp hx or h/o childhood pna/ asthma or knowledge of premature birth.  Sleeping ok without nocturnal  or early am exacerbation  of respiratory  c/o's or need for noct saba. Also denies any obvious fluctuation of  symptoms with weather or environmental changes or other aggravating or alleviating factors except as outlined above   Current Medications, Allergies, Complete Past Medical History, Past Surgical History, Family History, and Social History were reviewed in [employer] record.  ROS  The following are not active complaints unless bolded sore throat, dysphagia, dental problems, itching, sneezing,  nasal congestion or excess/ purulent secretions, ear ache,   fever, chills, sweats, unintended wt loss, classically pleuritic or exertional cp,  orthopnea pnd or leg swelling, presyncope, palpitations, abdominal pain, anorexia, nausea, vomiting, diarrhea  or change in bowel or bladder habits, change in stools or urine, dysuria,hematuria,  rash, arthralgias, visual complaints, headache, numbness, weakness or ataxia or problems with walking or coordination,  change in  mood/affect or memory.                   Objective:   Physical Exam  amb obese wf nad  10/23/2016        257  09/11/2016       266   08/27/16 266 lb (120.7 kg)  08/22/16 264 lb 12.8 oz (120.1 kg)  05/07/16 264 lb 1.9 oz (119.8 kg)    Vital signs reviewed  .- Note on arrival 02 sats  96 % on RA       HEENT: nl dentition, turbinates, and oropharynx. Nl external ear canals without cough reflex   NECK :  without JVD/Nodes/TM/ nl carotid upstrokes bilaterally   LUNGS: no acc muscle use,  Nl contour chest which is clear to A and P     CV:  RRR  no s3 or murmur or increase in P2, nad no edema   ABD:  soft and nontender with nl inspiratory excursion in the supine position. No bruits or organomegaly appreciated, bowel sounds nl  MS:  Nl gait/ ext warm without deformities, calf tenderness, cyanosis or clubbing No obvious joint restrictions   SKIN: warm and dry without lesions    NEURO:  alert, approp, nl sensorium with  no motor or cerebellar deficits apparent.      I personally reviewed images and agree with radiology impression as follows:  CTchest s contrast 08/09/16 No lung edema or consolidation.  No pleural effusion. No adenopathy. Small hiatal hernia. Thickening of the esophageal wall in the upper to mid esophagus. Question reflux esophagitis.         Assessment:

## 2016-10-23 NOTE — Assessment & Plan Note (Signed)
Trial off lopressor due to exp wheeze/cough > resolved 09/11/2016 on bisoprolol   Adequate control on present rx, reviewed in detail with pt > no change in rx needed

## 2016-10-23 NOTE — Assessment & Plan Note (Signed)
Body mass index is 44.11 kg/m.  trending down, encouraged  Lab Results  Component Value Date   TSH 0.321 (L) 06/16/2014     Contributing to gerd risk/ doe/reviewed the need and the process to achieve and maintain neg calorie balance > defer f/u primary care including intermittently monitoring thyroid status

## 2016-10-23 NOTE — Patient Instructions (Addendum)
Change protonix Take 30- 60 min before your first and last meals of the day x 2 weeks and continue pepcid 20 mg at bedtime   Work on inhaler technique:  relax and gently blow all the way out then take a nice smooth deep breath back in, triggering the inhaler at same time you start breathing in.  Hold for up to 5 seconds if you can. Blow out thru nose. Rinse and gargle with water when done   Symbicort 80 Take 2 puffs first thing in am and then another 2 puffs about 12 hours later.   GERD (REFLUX)  is an extremely common cause of respiratory symptoms just like yours , many times with no obvious heartburn at all.    It can be treated with medication, but also with lifestyle changes including elevation of the head of your bed (ideally with 6 inch  bed blocks),  Smoking cessation, avoidance of late meals, excessive alcohol, and avoid fatty foods, chocolate, peppermint, colas, red wine, and acidic juices such as orange juice.  NO MINT OR MENTHOL PRODUCTS SO NO COUGH DROPS   USE SUGARLESS CANDY INSTEAD (Jolley ranchers or Stover's or Life Savers) or even ice chips will also do - the key is to swallow to prevent all throat clearing. NO OIL BASED VITAMINS - use powdered substitutes.    Please see patient coordinator before you leave today  to schedule sinus CT    Please schedule a follow up office visit in 2  weeks, sooner if needed

## 2016-10-25 ENCOUNTER — Other Ambulatory Visit: Payer: Self-pay | Admitting: Internal Medicine

## 2016-10-25 ENCOUNTER — Ambulatory Visit (INDEPENDENT_AMBULATORY_CARE_PROVIDER_SITE_OTHER)
Admission: RE | Admit: 2016-10-25 | Discharge: 2016-10-25 | Disposition: A | Discharge status: Home / Self Care | Payer: Medicare Other | Source: Ambulatory Visit | Attending: Internal Medicine | Admitting: Internal Medicine

## 2016-10-25 DIAGNOSIS — R059 Cough, unspecified: Secondary | ICD-10-CM

## 2016-10-25 DIAGNOSIS — J45991 Cough variant asthma: Secondary | ICD-10-CM

## 2016-10-25 DIAGNOSIS — R05 Cough: Secondary | ICD-10-CM

## 2016-10-25 MED ORDER — PANTOPRAZOLE SODIUM 40 MG PO TBEC: 40.0000 mg | DELAYED_RELEASE_TABLET | Freq: Every day | ORAL | 3 refills | Status: DC

## 2016-10-25 MED ORDER — FAMOTIDINE 20 MG PO TABS: 20.0000 mg | ORAL_TABLET | Freq: Every day | ORAL | 3 refills | Status: DC

## 2016-11-13 ENCOUNTER — Encounter: Payer: Self-pay | Admitting: Internal Medicine

## 2016-11-13 ENCOUNTER — Ambulatory Visit (INDEPENDENT_AMBULATORY_CARE_PROVIDER_SITE_OTHER): Payer: Medicare Other | Admitting: Internal Medicine

## 2016-11-13 VITALS — BP 114/68 | HR 65 | Ht 64.0 in | Wt 266.0 lb

## 2016-11-13 DIAGNOSIS — J45991 Cough variant asthma: Secondary | ICD-10-CM | POA: Diagnosis not present

## 2016-11-13 DIAGNOSIS — I10 Essential (primary) hypertension: Secondary | ICD-10-CM | POA: Diagnosis not present

## 2016-11-13 MED ORDER — PREDNISONE 10 MG PO TABS: ORAL_TABLET | ORAL | 0 refills | Status: DC

## 2016-11-13 MED ORDER — BUDESONIDE-FORMOTEROL FUMARATE 80-4.5 MCG/ACT IN AERO: 2.0000 | INHALATION_SPRAY | Freq: Two times a day (BID) | RESPIRATORY_TRACT | 0 refills | Status: DC

## 2016-11-13 NOTE — Assessment & Plan Note (Signed)
Body mass index is 45.66 kg/m.  -  trending up  Lab Results  Component Value Date   TSH 0.321 (L) 06/16/2014     Contributing to gerd risk/ doe/reviewed the need and the process to achieve and maintain neg calorie balance > defer f/u primary care including intermittently monitoring thyroid status

## 2016-11-13 NOTE — Assessment & Plan Note (Signed)
Allergy profile 08/27/2016 >  Eos 0.4 /  IgE 11 Rast Pos dust only   - FENO 08/27/2016  =   49  Pre rx  rx with pred x 6 days and max gerd rx > 75% improvement and no worse off pred - FENO 09/11/2016  =   31 on no ICS - 09/11/2016 rec add zyrtec hs  - 10/23/2016   try symb 80 2bid  - Sinus CT 10/26/2016 > ok  - 11/13/2016  After extensive coaching HFA effectiveness =    50% (late trigger) continue symb 80 2bid and add h1 prn as likely has element of UACS  Each maintenance medication was reviewed in detail including most importantly the difference between maintenance and as needed and under what circumstances the prns are to be used.  Please see AVS for specific  Instructions which are unique to this visit and I personally typed out  which were reviewed in detail in writing with the patient and a copy provided.

## 2016-11-13 NOTE — Patient Instructions (Signed)
For drainage / throat tickle try take CHLORPHENIRAMINE  4 mg - take one every 4 hours as needed - available over the counter- may cause drowsiness so start with just a bedtime dose or two and see how you tolerate it before trying in daytime    Continue symbicort 80 Take 2 puffs first thing in am and then another 2 puffs about 12 hours later.   Work on inhaler technique:  relax and gently blow all the way out then take a nice smooth deep breath back in, triggering the inhaler at same time you start breathing in.  Hold for up to 5 seconds if you can. Blow out thru nose. Rinse and gargle with water when done   Prednisone 10 mg take  4 each am x 2 days,   2 each am x 2 days,  1 each am x 2 days and stop  Please schedule a follow up office visit in 2 weeks, sooner if needed

## 2016-11-13 NOTE — Progress Notes (Signed)
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 Pulmonary Consult    Referred by Dr. Brandt Loosen.  Pt c/o cough for the past year.  Cough is worse late in the evenings. She is coughing up green sputum.    no prior h/o cough/seasonal allergies/ asthma or sinus problem with new indolent  onset of cough winter 2017 persistent since then  esp before supper while sitting watching TV in recliner and slt green mucus poduction x 6 m and mild sob no better on inhalers and ent eval by [facility] no dx and p trazadone at hs sleeps fine and best time of day is am then gradually worse as day goes on - no better on augmentin - worse on mint > severe cough to point of choking rec GERD  Diet  Pantoprazole (protonix) 40 mg   Take  30-60 min before first meal of the day and Pepcid (famotidine)  20 mg one @  bedtime until return to office - this is the best way to tell whether stomach acid is contributing to your problem. Augmentin 875 mg take one pill twice daily  X 10 days - take at breakfast and supper with large glass of water.  It would help reduce the usual side effects (diarrhea and yeast infections) if you ate cultured yogurt at lunch.  Prednisone 10 mg take  4 each am x 2 days,   2 each am x 2 days,  1 each am x 2 days and stop  Stop lopressor and start bisoprolol 5 mg twice daily        09/11/2016  f/u ov/Doy Taaffe re: cough x winter of 2017 / hbp on new meds  Chief Complaint  Patient presents with  . Follow-up    Cough has improved some, but has not completely resolved.  She still has occ green to clear sputum production.   says about 75% better, min discolored mucus and daytime only,  never noct or early am and no assoc sob  rec Stay on pantoprazole before bfast and pepcid at bedtime unitl return  Stay on the diet  Ok to try Zyrtec either at bedtime or in am but may make you sleepy but if can't tolerate it get the clariton  If cough worsens call [facility] at [phone] to schedule sinus CT before next visit    10/23/2016  f/u ov/Harsh Trulock re:  Cough ? Etiology x winter 2017 on ppi qam/ h2 hs  Chief Complaint  Patient presents with  . Follow-up    f/u for chronic cough. Cough has improved slightly since last visit.   cough is worse p supper, congested sounding but not much mucus, worse on deep inspiration  rec Change protonix Take 30- 60 min before your first and last meals of the day x 2 weeks and continue pepcid 20 mg at bedtime  Work on inhaler technique:  Symbicort 80 Take 2 puffs first thing in am and then another 2 puffs about 12 hours later.  GERD (REFLUX Please see patient coordinator before you leave today  to schedule sinus CT    11/13/2016  f/u ov/Alesa Echevarria re:  Cough ? Asthma  Onset winter 2017 Chief Complaint  Patient presents with  . Follow-up    Pt states cough has changed since the last visit and is "irritable"- she has found that cough drops with honey have helped.   not using hfa effectively, best rx so far was prednisone and trip to [place] Cough is worse at hs  Not limited by breathing from desired activities    No obvious day to day or daytime variability or assoc excess/ purulent sputum or mucus plugs or hemoptysis or cp or chest tightness, subjective wheeze or overt sinus or hb symptoms. No unusual exp hx or h/o childhood pna/ asthma or knowledge of premature birth.  Sleeping ok without nocturnal  or early am exacerbation  of respiratory  c/o's or need for noct saba. Also denies any obvious fluctuation of symptoms with weather or environmental changes or other aggravating or alleviating factors except as outlined above   Current Medications,  Allergies, Complete Past Medical History, Past Surgical History, Family History, and Social History were reviewed in [employer] record.  ROS  The following are not active complaints unless bolded sore throat, dysphagia, dental problems, itching, sneezing,  nasal congestion or excess/ purulent secretions, ear ache,   fever, chills, sweats, unintended wt loss, classically pleuritic or exertional cp,  orthopnea pnd or leg swelling, presyncope, palpitations, abdominal pain, anorexia, nausea, vomiting, diarrhea  or change in bowel or bladder habits, change in stools or urine, dysuria,hematuria,  rash, arthralgias, visual complaints, headache, numbness, weakness or ataxia or problems with walking or coordination,  change in mood/affect or memory.                  Objective:   Physical Exam  amb obese wf nad   11/13/2016      266  10/23/2016       257  09/11/2016       266   08/27/16 266 lb (120.7 kg)  08/22/16 264 lb 12.8 oz (120.1 kg)  05/07/16 264 lb 1.9 oz (119.8 kg)    Vital signs reviewed  .- Note on arrival 02 sats  95 % on RA and bp 114/68        HEENT: nl dentition, turbinates, and oropharynx which is pristine . Nl external ear canals without cough reflex   NECK :  without JVD/Nodes/TM/ nl carotid upstrokes bilaterally   LUNGS: no acc muscle use,  Nl contour chest which is clear to A and P     CV:  RRR  no s3 or murmur or increase in P2, nad no edema   ABD:  soft and nontender with nl inspiratory excursion in the supine position. No bruits or organomegaly appreciated, bowel sounds nl  MS:  Nl gait/ ext warm without deformities, calf tenderness, cyanosis or clubbing No obvious joint restrictions   SKIN: warm and dry without lesions    NEURO:  alert, approp, nl sensorium with  no motor or cerebellar deficits apparent.      I personally reviewed images and agree with radiology impression as follows:  CT sinus 10/25/16 Minimal mucosal thickening  posterosuperior aspect of the right maxillary sinus probable small mucous retention cyst measures 4.6 mm. No paranasal sinuses opacification or air-fluid levels. Nasal septum is midline. Unremarkable nasal turbinates.      Assessment:

## 2016-11-13 NOTE — Assessment & Plan Note (Signed)
Adequate control on present rx, reviewed in detail with pt > no change in rx needed though looks like choice of more specific beta blocker has not made a lot of difference in her symptoms  - don't want to muddy the water here until we sort it all out

## 2016-11-27 ENCOUNTER — Ambulatory Visit (INDEPENDENT_AMBULATORY_CARE_PROVIDER_SITE_OTHER)
Admission: RE | Admit: 2016-11-27 | Discharge: 2016-11-27 | Disposition: A | Discharge status: Home / Self Care | Payer: Medicare Other | Source: Ambulatory Visit | Attending: Internal Medicine | Admitting: Internal Medicine

## 2016-11-27 ENCOUNTER — Other Ambulatory Visit (INDEPENDENT_AMBULATORY_CARE_PROVIDER_SITE_OTHER): Payer: Medicare Other

## 2016-11-27 ENCOUNTER — Ambulatory Visit (INDEPENDENT_AMBULATORY_CARE_PROVIDER_SITE_OTHER): Payer: Medicare Other | Admitting: Internal Medicine

## 2016-11-27 ENCOUNTER — Encounter: Payer: Self-pay | Admitting: Internal Medicine

## 2016-11-27 VITALS — BP 128/84 | HR 70 | Ht 64.0 in | Wt 268.6 lb

## 2016-11-27 DIAGNOSIS — R0600 Dyspnea, unspecified: Secondary | ICD-10-CM

## 2016-11-27 DIAGNOSIS — R0609 Other forms of dyspnea: Secondary | ICD-10-CM

## 2016-11-27 DIAGNOSIS — J45991 Cough variant asthma: Secondary | ICD-10-CM | POA: Diagnosis not present

## 2016-11-27 LAB — BRAIN NATRIURETIC PEPTIDE: Pro B Natriuretic peptide (BNP): 329 pg/mL — ABNORMAL HIGH (ref 0.0–100.0)

## 2016-11-27 LAB — TSH: TSH: 1.21 u[IU]/mL (ref 0.35–4.50)

## 2016-11-27 NOTE — Patient Instructions (Signed)
No change medications for now  Please remember to go to the lab and x-ray department downstairs in the basement  for your tests - we will call you with the results when they are available.     Please schedule a follow up office visit in 4 weeks, sooner if needed

## 2016-11-27 NOTE — Progress Notes (Signed)
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 Pulmonary Consult    Referred by Dr. Brandt Loosen.  Pt c/o cough for the past year.  Cough is worse late in the evenings. She is coughing up green sputum.    no prior h/o cough/seasonal allergies/ asthma or sinus problem with new indolent  onset of cough winter 2017 persistent since then  esp before supper while sitting watching TV in recliner and slt green mucus poduction x 6 m and mild sob no better on inhalers and ent eval by [facility] no dx and p trazadone at hs sleeps fine and best time of day is am then gradually worse as day goes on - no better on augmentin - worse on mint > severe cough to point of choking rec GERD  Diet  Pantoprazole (protonix) 40 mg   Take  30-60 min before first meal of the day and Pepcid (famotidine)  20 mg one @  bedtime until return to office - this is the best way to tell whether stomach acid is contributing to your problem. Augmentin 875 mg take one pill twice daily  X 10 days - take at breakfast and supper with large glass of water.  It would help reduce the usual side effects (diarrhea and yeast infections) if you ate cultured yogurt at lunch.  Prednisone 10 mg take  4 each am x 2 days,   2 each am x 2 days,  1 each am x 2 days and stop  Stop lopressor and start bisoprolol 5 mg twice daily        09/11/2016  f/u ov/Roseland Braun re: cough x winter of 2017 / hbp on new meds  Chief Complaint  Patient presents with  . Follow-up    Cough has improved some, but has not completely resolved.  She still has occ green to clear sputum production.   says about 75% better, min discolored mucus and daytime only,  never noct or early am and no assoc sob  rec Stay on pantoprazole before bfast and pepcid at bedtime unitl return  Stay on the diet  Ok to try Zyrtec either at bedtime or in am but may make you sleepy but if can't tolerate it get the clariton  If cough worsens call [facility] at [phone] to schedule sinus CT before next visit    10/23/2016  f/u ov/Roxanna Mcever re:  Cough ? Etiology x winter 2017 on ppi qam/ h2 hs  Chief Complaint  Patient presents with  . Follow-up    f/u for chronic cough. Cough has improved slightly since last visit.   cough is worse p supper, congested sounding but not much mucus, worse on deep inspiration  rec Change protonix Take 30- 60 min before your first and last meals of the day x 2 weeks and continue pepcid 20 mg at bedtime  Work on inhaler technique:  Symbicort 80 Take 2 puffs first thing in am and then another 2 puffs about 12 hours later.  GERD (REFLUX Please see patient coordinator before you leave today  to schedule sinus CT     11/13/2016  f/u ov/Dorota Heinrichs  re:  Cough ? Asthma  Onset winter 2017 Chief Complaint  Patient presents with  . Follow-up    Pt states cough has changed since the last visit and is "irritable"- she has found that cough drops with honey have helped.   not using hfa effectively, best rx so far was prednisone and trip to [place] Cough is worse at hs  Not limited by breathing from desired activities   rec For drainage / throat tickle try take CHLORPHENIRAMINE  4 mg - take one every 4 hours as needed - available over the counter- may cause drowsiness so start with just a bedtime dose or two and see how you tolerate it before trying in daytime   Continue symbicort 80 Take 2 puffs first thing in am and then another 2 puffs about 12 hours later.  Work on inhaler technique:  Prednisone 10 mg take  4 each am x 2 days,   2 each am x 2 days,  1 each am x 2 days and stop   11/27/2016  f/u ov/Wilhemina Grall re: cough since winter 2017 resolved so stopped  symb and gerd rx x one week  And no worse  Chief Complaint  Patient presents with  . Follow-up    Pt states cough is gone, pt still has some sob with exertion    All she takes now is h1hs  But doe not better = MMRC2 = can't walk a nl pace on a flat grade s sob but does fine slow and flat eg shopping   No obvious day to day or daytime variability or assoc excess/ purulent sputum or mucus plugs or hemoptysis or cp or chest tightness, subjective wheeze or overt sinus or hb symptoms. No unusual exp hx or h/o childhood pna/ asthma or knowledge of premature birth.  Sleeping ok without nocturnal  or early am exacerbation  of respiratory  c/o's or need for noct saba. Also denies any obvious fluctuation of symptoms with weather or environmental changes or other aggravating or alleviating factors except as outlined above   Current Medications, Allergies, Complete Past Medical History, Past Surgical History, Family History, and Social History were reviewed in [employer] record.  ROS  The following are not active complaints unless bolded sore throat, dysphagia, dental problems, itching, sneezing,  nasal congestion or excess/ purulent secretions, ear ache,   fever, chills, sweats, unintended wt loss, classically pleuritic or exertional cp,  orthopnea pnd or leg swelling, presyncope, palpitations, abdominal pain, anorexia, nausea, vomiting, diarrhea  or change in bowel or bladder habits, change in stools or urine, dysuria,hematuria,  rash, arthralgias, visual complaints, headache, numbness, weakness or ataxia or problems with walking or coordination,  change in mood/affect or memory.                Objective:   Physical Exam  amb obese wf nad  Very minimal  throat clearing     11/27/2016        269  11/13/2016      266  10/23/2016       257  09/11/2016       266   08/27/16 266 lb (120.7 kg)  08/22/16 264 lb 12.8 oz (120.1 kg)  05/07/16 264 lb 1.9 oz (119.8 kg)    Vital  signs reviewed  . - Note on arrival 02 sats  95% on  RA      HEENT: nl dentition, turbinates, and oropharynx which is pristine . Nl external ear canals  without cough reflex   NECK :  without JVD/Nodes/TM/ nl carotid upstrokes bilaterally   LUNGS: no acc muscle use,  Nl contour chest which is clear to A and P - no cough on insp or exp     CV:  RRR  no s3 or murmur or increase in P2, nad no edema   ABD:  soft and nontender with nl inspiratory excursion in the supine position. No bruits or organomegaly appreciated, bowel sounds nl  MS:  Nl gait/ ext warm without deformities, calf tenderness, cyanosis or clubbing No obvious joint restrictions   SKIN: warm and dry without lesions    NEURO:  alert, approp, nl sensorium with  no motor or cerebellar deficits apparent.       CXR PA and Lateral:   11/27/2016 :    I personally reviewed images and agree with radiology impression as follows:   Cardiac shadow is mildly enlarged. The lungs are well aerated bilaterally. No focal infiltrate or sizable effusion is seen. No acute bony abnormality is noted.   Labs ordered/ reviewed:      Chemistry      Component Value Date/Time   NA 137 05/21/2016 1230   K 4.6 05/21/2016 1230   CL 99 05/21/2016 1230   CO2 29 05/21/2016 1230   BUN 16 05/21/2016 1230   CREATININE 0.92 (H) 05/21/2016 1230      Component Value Date/Time   CALCIUM 9.3 05/21/2016 1230   ALKPHOS 66 09/18/2015 1041   AST 26 09/18/2015 1041   ALT 21 09/18/2015 1041   BILITOT 1.0 09/18/2015 1041        Lab Results  Component Value Date   WBC 8.7 08/27/2016   HGB 13.3 08/27/2016   HCT 39.9 08/27/2016   MCV 87.9 08/27/2016   PLT 285.0 08/27/2016     Lab Results  Component Value Date   DDIMER 0.97 (H) 11/27/2016      Lab Results  Component Value Date   TSH 1.21 11/27/2016     Lab Results  Component Value Date   PROBNP 329.0 (H) 11/27/2016                Assessment:

## 2016-11-28 LAB — D-DIMER, QUANTITATIVE: D-Dimer, Quant: 0.97 mcg/mL FEU — ABNORMAL HIGH (ref <0.50)

## 2016-11-28 NOTE — Progress Notes (Signed)
Spoke with pt and notified of results per Dr. Wert. Pt verbalized understanding and denied any questions. 

## 2016-11-29 ENCOUNTER — Encounter: Payer: Self-pay | Admitting: Internal Medicine

## 2016-11-29 NOTE — Assessment & Plan Note (Signed)
Allergy profile 08/27/2016 >  Eos 0.4 /  IgE 11 Rast Pos dust only   - FENO 08/27/2016  =   49  Pre rx  rx with pred x 6 days and max gerd rx > 75% improvement and no worse off pred - FENO 09/11/2016  =   31 on no ICS - 09/11/2016 rec add zyrtec hs  - 10/23/2016   try symb 80 2bid  - Sinus CT 10/26/2016 > ok - 11/13/2016  After extensive coaching HFA effectiveness =    50% (late trigger) continue symb 80 2bid and add h1 prn >  Resolved so just taking h1h2hs as of 11/27/2016   Better to her satisfaction but I rec low threshold to add back first the ppi ac and then the symb 80 2bid if the cough recurs

## 2016-11-29 NOTE — Assessment & Plan Note (Addendum)
Echo 06/06/2016 Left ventricle: The cavity size was normal. Wall thickness was   increased in a pattern of mild LVH. There was moderate focal   basal hypertrophy of the septum. Systolic function was normal.   The estimated ejection fraction was in the range of 60% to 65%.   Doppler parameters are consistent with abnormal left ventricular   relaxation (grade 1 diastolic dysfunction). - Mitral valve: Calcified annulus. Mildly thickened leaflets .   There was mild regurgitation. - Left atrium:   moderately dilated. - Right atrium: The atrium was mildly dilated. - Pulmonary arteries: PA peak pressure: 38 mm Hg (S). - Spirometry 11/27/2016 wnl with nl f/v curve in effort indep portion  11/27/2016   Walked RA  2 laps @ 185 ft each stopped due to  Sob/ sats ok/ nl pace    The differential diagnosis   is extensive with no quick and easy answers but easy to remember because it consists of 13 A's,  Two Bs and one C: 1. Adherence, always a challenge  And mostly related to wt issues here, not meds   2. Acid reflux disease, with the greater proportion of pulmonary patients with no overt heartburn symptoms, and no easy way to treat non-acid reflux > add back ppi q am if worse   3. Ace inhibitor use > n/a 4. Active sinus dz, best addressed by a sinus ct > done 5. Active smoking,  > n/a 6. Allergic diseases  > already ruled out  7. Aspiration,  > n/a 8. Allergic Bronchopulmonary Aspergillosis, associated with IgE's in the thousands> already ruled out 9. Alpha one Antitrypsin deficiency, > n/a 10. Adverse effect of inhalers, especially DPI's and especially with poor inhaler technique> n/a 11 Anxiety, always a diagnosis of exclusion 12. A bunch of PE's ie moderately large clot burden, a few small ones peripherally can cause pleuritic cp syndromes but not unexplained dyspnea -  - while  A high range nl value for age (which in her case is >> 1.0)   may miss small peripheral pe, the clot burden with sob is  moderately high and the d dimer has a very high neg pred value in this setting   13 Anemia or Thyroid disorders, > ruled out  Two B's 1. Bronchiectasis:  unlikely 2  Beta blocker effects:   Should not be a problem on low dose zebeta  One C 1. Congestive heart failure>  By elimination the most likely problem other than obesity ? Needs higher afterload reduction given diastolic dysfunction and MR  With LAE noted    For now just rec wt loss and f/u with Primary care   I had an extended discussion with the patient reviewing all relevant studies completed to date and  lasting 25 minutes of a 40  minute office  visit addressing  non-specific but potentially very serious refractory respiratory symptoms of uncertain and potentially multiple  etiologies.   Each maintenance medication was reviewed in detail including most importantly the difference between maintenance and prns and under what circumstances the prns are to be triggered using an action plan format that is not reflected in the computer generated alphabetically organized AVS.    Please see AVS for specific instructions unique to this office visit that I personally wrote and verbalized to the the pt in detail and then reviewed with pt  by my nurse highlighting any changes in therapy/plan of care  recommended at today's visit.

## 2016-12-25 ENCOUNTER — Ambulatory Visit (INDEPENDENT_AMBULATORY_CARE_PROVIDER_SITE_OTHER): Payer: Medicare Other | Admitting: Internal Medicine

## 2016-12-25 ENCOUNTER — Encounter: Payer: Self-pay | Admitting: Internal Medicine

## 2016-12-25 VITALS — BP 120/70 | HR 70 | Ht 64.0 in | Wt 262.0 lb

## 2016-12-25 DIAGNOSIS — I10 Essential (primary) hypertension: Secondary | ICD-10-CM

## 2016-12-25 DIAGNOSIS — J45991 Cough variant asthma: Secondary | ICD-10-CM | POA: Diagnosis not present

## 2016-12-25 DIAGNOSIS — R0609 Other forms of dyspnea: Secondary | ICD-10-CM | POA: Diagnosis not present

## 2016-12-25 DIAGNOSIS — R0600 Dyspnea, unspecified: Secondary | ICD-10-CM

## 2016-12-25 LAB — NITRIC OXIDE: Nitric Oxide: 40

## 2016-12-25 MED ORDER — BUDESONIDE-FORMOTEROL FUMARATE 80-4.5 MCG/ACT IN AERO: 2.0000 | INHALATION_SPRAY | Freq: Two times a day (BID) | RESPIRATORY_TRACT | 0 refills | Status: DC

## 2016-12-25 MED ORDER — BUDESONIDE-FORMOTEROL FUMARATE 80-4.5 MCG/ACT IN AERO
2.0000 | INHALATION_SPRAY | Freq: Two times a day (BID) | RESPIRATORY_TRACT | 12 refills | Status: DC
Start: 2016-12-25 — End: 2018-06-10

## 2016-12-25 NOTE — Patient Instructions (Addendum)
For drainage / throat tickle try take CHLORPHENIRAMINE  4 mg - take one every 4 hours as needed - available over the counter- may cause drowsiness so start with just a bedtime dose or two and see how you tolerate it before trying in daytime     Symbicort 80 Take 2 puffs first thing in am and then another 2 puffs about 12 hours later.      If not better add Try prilosec otc 20mg   Take 30-60 min before first meal of the day and Pepcid ac (famotidine) 20 mg one @  bedtime until cough is completely gone for at least a week without the need for cough suppression     Please schedule a follow up office visit in 4 weeks, sooner if needed

## 2016-12-25 NOTE — Progress Notes (Signed)
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 Pulmonary Consult    Referred by Dr. Brandt Loosen.  Pt c/o cough for the past year.  Cough is worse late in the evenings. She is coughing up green sputum.    no prior h/o cough/seasonal allergies/ asthma or sinus problem with new indolent  onset of cough winter 2017 persistent since then  esp before supper while sitting watching TV in recliner and slt green mucus poduction x 6 m and mild sob no better on inhalers and ent eval by [place] no dx and p trazadone at hs sleeps fine and best time of day is am then gradually worse as day goes on - no better on augmentin - worse on mint > severe cough to point of choking rec GERD  Diet  Pantoprazole (protonix) 40 mg   Take  30-60 min before first meal of the day and Pepcid (famotidine)  20 mg one @  bedtime until return to office - this is the best way to tell whether stomach acid is contributing to your problem. Augmentin 875 mg take one pill twice daily  X 10 days - take at breakfast and supper with large glass of water.  It would help reduce the usual side effects (diarrhea and yeast infections) if you ate cultured yogurt at lunch.  Prednisone 10 mg take  4 each am x 2 days,   2 each am x 2 days,  1 each am x 2 days and stop  Stop lopressor and start bisoprolol 5 mg twice daily        09/11/2016  f/u ov/Samantha Stone re: cough x winter of 2017 / hbp on new meds  Chief Complaint  Patient presents with  . Follow-up    Cough has improved some, but has not completely resolved.  She still has occ green to clear sputum production.   says about 75% better, min discolored mucus and daytime only,  never noct or early am and no assoc sob  rec Stay on pantoprazole before bfast and pepcid at bedtime unitl return  Stay on the diet  Ok to try Zyrtec either at bedtime or in am but may make you sleepy but if can't tolerate it get the clariton  If cough worsens call [facility] at [phone] to schedule sinus CT before next visit    10/23/2016  f/u ov/Samantha Stone re:  Cough ? Etiology x winter 2017 on ppi qam/ h2 hs  Chief Complaint  Patient presents with  . Follow-up    f/u for chronic cough. Cough has improved slightly since last visit.   cough is worse p supper, congested sounding but not much mucus, worse on deep inspiration  rec Change protonix Take 30- 60 min before your first and last meals of the day x 2 weeks and continue pepcid 20 mg at bedtime  Work on inhaler technique:  Symbicort 80 Take 2 puffs first thing in am and then another 2 puffs about 12 hours later.  GERD (REFLUX Please see patient coordinator before you leave today  to schedule sinus CT     11/13/2016  f/u ov/Samantha Stone  re:  Cough ? Asthma  Onset winter 2017 Chief Complaint  Patient presents with  . Follow-up    Pt states cough has changed since the last visit and is "irritable"- she has found that cough drops with honey have helped.   not using hfa effectively, best rx so far was prednisone and trip to [place] Cough is worse at hs  Not limited by breathing from desired activities   rec For drainage / throat tickle try take CHLORPHENIRAMINE  4 mg - take one every 4 hours as needed - available over the counter- may cause drowsiness so start with just a bedtime dose or two and see how you tolerate it before trying in daytime   Continue symbicort 80 Take 2 puffs first thing in am and then another 2 puffs about 12 hours later.  Work on inhaler technique:  Prednisone 10 mg take  4 each am x 2 days,   2 each am x 2 days,  1 each am x 2 days and stop   11/27/2016  f/u ov/Samantha Stone re: cough since winter 2017 resolved so stopped  symb and gerd rx x one week  And no worse  Chief Complaint  Patient presents with  . Follow-up    Pt states cough is gone, pt still has some sob with exertion    All she takes now is h1hs  But doe not better = MMRC2 = can't walk a nl pace on a flat grade s sob but does fine slow and flat eg shopping rec No change medications for now Please remember to go to the lab and x-ray department downstairs in the basement  for your tests - we will call you with the results when they are available.     12/25/2016  f/u ov/Samantha Stone re: recurrent cough /MO/ doe with elevated BNP  Chief Complaint  Patient presents with  . Follow-up    Breathing has improved slightly. She has started coughing again- mucinex has helped. The cough is prod with yellow to green sputum.    breathing is only slightly better Cough onset  Was one week p last ov p exp to pollen but not assoc rhinitis and worse after supper and at hs  Has chlorpheniramine one hs only  No obvious day to day or daytime variability or assoc excess/ purulent sputum or mucus plugs or hemoptysis or cp or chest tightness, subjective wheeze or overt sinus or hb symptoms. No unusual exp hx or h/o childhood pna/ asthma or knowledge of premature birth.  Sleeping ok without nocturnal  or early am exacerbation  of respiratory  c/o's or need for noct saba. Also denies any obvious fluctuation of symptoms with weather or environmental changes or other aggravating or alleviating factors except as outlined above   Current Medications, Allergies, Complete Past Medical History, Past Surgical History, Family History, and Social History were reviewed in [employer] record.  ROS  The following are not active complaints unless bolded sore throat, dysphagia, dental problems, itching, sneezing,  nasal congestion or excess/ purulent secretions, ear ache,   fever, chills, sweats, unintended wt loss, classically pleuritic or exertional cp,  orthopnea pnd or  leg swelling, presyncope, palpitations, abdominal pain, anorexia, nausea, vomiting, diarrhea  or change in bowel or bladder habits, change in stools or urine, dysuria,hematuria,  rash, arthralgias, visual complaints, headache, numbness, weakness or ataxia or problems with walking or coordination,  change in mood/affect or memory.  Objective:   Physical Exam  amb obese wf nad     12/25/2016      262  11/27/2016        269  11/13/2016      266  10/23/2016       257  09/11/2016       266   08/27/16 266 lb (120.7 kg)  08/22/16 264 lb 12.8 oz (120.1 kg)  05/07/16 264 lb 1.9 oz (119.8 kg)    Vital signs reviewed  . - Note on arrival 02 sats  97% on  RA      HEENT: nl dentition, turbinates, and oropharynx which is pristine . Nl external ear canals without cough reflex   NECK :  without JVD/Nodes/TM/ nl carotid upstrokes bilaterally   LUNGS: no acc muscle use,  Nl contour chest which is clear to A and P -     CV:  RRR  no s3 or murmur or increase in P2, nad no edema   ABD:  soft and nontender with nl inspiratory excursion in the supine position. No bruits or organomegaly appreciated, bowel sounds nl  MS:  Nl gait/ ext warm without deformities, calf tenderness, cyanosis or clubbing No obvious joint restrictions   SKIN: warm and dry without lesions    NEURO:  alert, approp, nl sensorium with  no motor or cerebellar deficits apparent.       CXR PA and Lateral:   11/27/2016 :    I personally reviewed images and agree with radiology impression as follows:   Cardiac shadow is mildly enlarged. The lungs are well aerated bilaterally. No focal infiltrate or sizable effusion is seen. No acute bony abnormality is noted.   Labs  eviewed:      Chemistry      Component Value Date/Time   NA 137 05/21/2016 1230   K 4.6 05/21/2016 1230   CL 99 05/21/2016 1230   CO2 29 05/21/2016 1230   BUN 16 05/21/2016 1230   CREATININE 0.92 (H) 05/21/2016 1230      Component Value  Date/Time   CALCIUM 9.3 05/21/2016 1230   ALKPHOS 66 09/18/2015 1041   AST 26 09/18/2015 1041   ALT 21 09/18/2015 1041   BILITOT 1.0 09/18/2015 1041        Lab Results  Component Value Date   WBC 8.7 08/27/2016   HGB 13.3 08/27/2016   HCT 39.9 08/27/2016   MCV 87.9 08/27/2016   PLT 285.0 08/27/2016       Lab Results  Component Value Date   PROBNP 329.0 (H) 11/27/2016                Assessment:    Outpatient Encounter Prescriptions as of 12/25/2016  Medication Sig  . Ascorbic Acid (VITAMIN C PO) Take 1 capsule by mouth daily.  Marland Kitchen aspirin EC 81 MG tablet Take 81 mg by mouth daily.  . beta carotene w/minerals (OCUVITE) tablet Take 1 tablet by mouth 2 (two) times daily.  Marland Kitchen BIOTIN PO Take 1 tablet by mouth daily.  . bisoprolol (ZEBETA) 5 MG tablet Take 1 tablet (5 mg total) by mouth daily.  Marland Kitchen CALCIUM-VITAMIN D PO Take 1 tablet by mouth daily.  . Cholecalciferol (VITAMIN D-3) 1000 units CAPS Take 1 capsule by mouth daily.  . citalopram (CELEXA) 20 MG tablet Take 20 mg by mouth daily.  . ferrous sulfate 325 (65 FE) MG tablet Take 325 mg by mouth 2 (two) times daily with a meal.   .  folic acid (FOLVITE) 031 MCG tablet Take 800 mcg by mouth daily.  . furosemide (LASIX) 40 MG tablet Take 60 mg by mouth daily.  Marland Kitchen levothyroxine (SYNTHROID, LEVOTHROID) 125 MCG tablet Take 125 mcg by mouth daily.  Marland Kitchen losartan (COZAAR) 50 MG tablet Take 50 mg by mouth daily.  . potassium chloride SA (K-DUR,KLOR-CON) 20 MEQ tablet Take 1 tablet (20 mEq total) by mouth daily.  . pravastatin (PRAVACHOL) 10 MG tablet Take 10 mg by mouth daily.  . traZODone (DESYREL) 50 MG tablet Take 50 mg by mouth at bedtime.  . budesonide-formoterol (SYMBICORT) 80-4.5 MCG/ACT inhaler Inhale 2 puffs into the lungs 2 (two) times daily.  . [DISCONTINUED] budesonide-formoterol (SYMBICORT) 80-4.5 MCG/ACT inhaler Inhale 2 puffs into the lungs 2 (two) times daily.   No facility-administered encounter medications on file as  of 12/25/2016.

## 2016-12-26 NOTE — Assessment & Plan Note (Signed)
Echo 06/06/2016 Left ventricle: The cavity size was normal. Wall thickness was   increased in a pattern of mild LVH. There was moderate focal   basal hypertrophy of the septum. Systolic function was normal.   The estimated ejection fraction was in the range of 60% to 65%.   Doppler parameters are consistent with abnormal left ventricular   relaxation (grade 1 diastolic dysfunction). - Mitral valve: Calcified annulus. Mildly thickened leaflets .   There was mild regurgitation. - Left atrium:   moderately dilated. - Right atrium: The atrium was mildly dilated. - Pulmonary arteries: PA peak pressure: 38 mm Hg (S). - Spirometry 11/27/2016 wnl with nl f/v curve in effort indep portion  11/27/2016   Walked RA  2 laps @ 185 ft each stopped due to  Sob/ sats ok/ nl pace   Probably a combination of obesity an  diastolic dysfunction > cards f/u planned

## 2016-12-26 NOTE — Assessment & Plan Note (Signed)
Trial off lopressor due to exp wheeze/cough > resolved 09/11/2016 on bisoprolol   Adequate control on present rx, reviewed in detail with pt > no change in rx needed    Although even in retrospect it may not be clear the lopressor  contributed to the pt's symptoms,  Pt improved off them and adding them back at this point or in the future would risk confusion in interpretation of non-specific respiratory symptoms to which this patient is prone  ie  Better not to muddy the waters here.

## 2016-12-26 NOTE — Assessment & Plan Note (Signed)
Allergy profile 08/27/2016 >  Eos 0.4 /  IgE 11 Rast Pos dust only   - FENO 08/27/2016  =   49  Pre rx  rx with pred x 6 days and max gerd rx > 75% improvement and no worse off pred - FENO 09/11/2016  =   31 on no ICS - 09/11/2016 rec add zyrtec hs  - 10/23/2016   try symb 80 2bid  - Sinus CT 10/26/2016 > ok - 11/13/2016  After extensive coaching HFA effectiveness =    50% (late trigger) continue symb 80 2bid and add h1 prn >  Resolved so just taking h1h2hs as of 11/27/2016   - FENO 12/25/2016  =  40 with flare off ICS    > rechallenge with one week of symb 80 2bid s gerd rx or predisone this time  - 12/25/2016  After extensive coaching HFA effectiveness =    75% from a basline of < 50%  She clearly previously improved p rx for gerd/ pred and on symb 80 but stopped them all at the same time and now p pollen exposure (with neg RAST noted) she is worse again so rec this time just challenge with ICS and if not responding then add back gerd rx  To sort out the main mechanism acknowledging up front that many pts have more than one mechanism  One way to sort out in future would be to do MCT on max rx for gerd and off ics   F/u q 6 weeks until able to sort this out  I had an extended discussion with the patient reviewing all relevant studies completed to date and  lasting 15 to 20 minutes of a 25 minute visit    Formulary restrictions will be an ongoing challenge for the forseable future and I would be happy to pick an alternative if the pt will first  provide me a list of them but pt  will need to return here for training for any new device that is required eg dpi vs hfa vs respimat.    In meantime we can always provide samples so the patient never runs out of any needed respiratory medications.   Each maintenance medication was reviewed in detail including most importantly the difference between maintenance and prns and under what circumstances the prns are to be triggered using an action plan format that is  not reflected in the computer generated alphabetically organized AVS.    Please see AVS for specific instructions unique to this visit that I personally wrote and verbalized to the the pt in detail and then reviewed with pt  by my nurse highlighting any  changes in therapy recommended at today's visit to their plan of care.

## 2016-12-26 NOTE — Assessment & Plan Note (Signed)
Body mass index is 44.97 kg/m.  -  trending down, encouraged  Lab Results  Component Value Date   TSH 1.21 11/27/2016     Contributing to gerd risk/ doe/reviewed the need and the process to achieve and maintain neg calorie balance > defer f/u primary care including intermittently monitoring thyroid status

## 2017-01-22 ENCOUNTER — Encounter: Payer: Self-pay | Admitting: Internal Medicine

## 2017-01-22 ENCOUNTER — Other Ambulatory Visit (INDEPENDENT_AMBULATORY_CARE_PROVIDER_SITE_OTHER): Payer: Medicare Other

## 2017-01-22 ENCOUNTER — Ambulatory Visit (INDEPENDENT_AMBULATORY_CARE_PROVIDER_SITE_OTHER): Payer: Medicare Other | Admitting: Internal Medicine

## 2017-01-22 VITALS — BP 148/60 | HR 80 | Ht 64.0 in | Wt 263.0 lb

## 2017-01-22 DIAGNOSIS — I10 Essential (primary) hypertension: Secondary | ICD-10-CM

## 2017-01-22 DIAGNOSIS — R0609 Other forms of dyspnea: Secondary | ICD-10-CM

## 2017-01-22 DIAGNOSIS — J45991 Cough variant asthma: Secondary | ICD-10-CM

## 2017-01-22 DIAGNOSIS — R0600 Dyspnea, unspecified: Secondary | ICD-10-CM

## 2017-01-22 LAB — BASIC METABOLIC PANEL
BUN: 16 mg/dL (ref 6–23)
CO2: 31 mEq/L (ref 19–32)
Calcium: 9.5 mg/dL (ref 8.4–10.5)
Chloride: 101 mEq/L (ref 96–112)
Creatinine, Ser: 0.96 mg/dL (ref 0.40–1.20)
GFR: 58.86 mL/min — ABNORMAL LOW (ref >60.00)
Glucose, Bld: 131 mg/dL — ABNORMAL HIGH (ref 70–99)
Potassium: 4.2 mEq/L (ref 3.5–5.1)
Sodium: 138 mEq/L (ref 135–145)

## 2017-01-22 NOTE — Progress Notes (Signed)
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 Pulmonary Consult    Referred by Dr. Brandt Loosen.  Pt c/o cough for the past year.  Cough is worse late in the evenings. She is coughing up green sputum.    no prior h/o cough/seasonal allergies/ asthma or sinus problem with new indolent  onset of cough winter 2017 persistent since then  esp before supper while sitting watching TV in recliner and slt green mucus poduction x 6 m and mild sob no better on inhalers and ent eval by [place] no dx and p trazadone at hs sleeps fine and best time of day is am then gradually worse as day goes on - no better on augmentin - worse on mint > severe cough to point of choking rec GERD  Diet  Pantoprazole (protonix) 40 mg   Take  30-60 min before first meal of the day and Pepcid (famotidine)  20 mg one @  bedtime until return to office - this is the best way to tell whether stomach acid is contributing to your problem. Augmentin 875 mg take one pill twice daily  X 10 days - take at breakfast and supper with large glass of water.  It would help reduce the usual side effects (diarrhea and yeast infections) if you ate cultured yogurt at lunch.  Prednisone 10 mg take  4 each am x 2 days,   2 each am x 2 days,  1 each am x 2 days and stop  Stop lopressor and start bisoprolol 5 mg twice daily        09/11/2016  f/u ov/Ac Colan re: cough x winter of 2017 / hbp on new meds  Chief Complaint  Patient presents with  . Follow-up    Cough has improved some, but has not completely resolved.  She still has occ green to clear sputum production.   says about 75% better, min discolored mucus and daytime only,  never noct or early am and no assoc sob  rec Stay on pantoprazole before bfast and pepcid at bedtime unitl return  Stay on the diet  Ok to try Zyrtec either at bedtime or in am but may make you sleepy but if can't tolerate it get the clariton  If cough worsens call [facility] at [phone] to schedule sinus CT before next visit    10/23/2016  f/u ov/Samantha Stone re:  Cough ? Etiology x winter 2017 on ppi qam/ h2 hs  Chief Complaint  Patient presents with  . Follow-up    f/u for chronic cough. Cough has improved slightly since last visit.   cough is worse p supper, congested sounding but not much mucus, worse on deep inspiration  rec Change protonix Take 30- 60 min before your first and last meals of the day x 2 weeks and continue pepcid 20 mg at bedtime  Work on inhaler technique:  Symbicort 80 Take 2 puffs first thing in am and then another 2 puffs about 12 hours later.  GERD (REFLUX Please see patient coordinator before you leave today  to schedule sinus CT     11/13/2016  f/u ov/Samantha Stone  re:  Cough ? Asthma  Onset winter 2017 Chief Complaint  Patient presents with  . Follow-up    Pt states cough has changed since the last visit and is "irritable"- she has found that cough drops with honey have helped.   not using hfa effectively, best rx so far was prednisone and trip to [place] Cough is worse at hs  Not limited by breathing from desired activities   rec For drainage / throat tickle try take CHLORPHENIRAMINE  4 mg - take one every 4 hours as needed - available over the counter- may cause drowsiness so start with just a bedtime dose or two and see how you tolerate it before trying in daytime   Continue symbicort 80 Take 2 puffs first thing in am and then another 2 puffs about 12 hours later.  Work on inhaler technique:  Prednisone 10 mg take  4 each am x 2 days,   2 each am x 2 days,  1 each am x 2 days and stop   11/27/2016  f/u ov/Samantha Stone re: cough since winter 2017 resolved so stopped  symb and gerd rx x one week  And no worse  Chief Complaint  Patient presents with  . Follow-up    Pt states cough is gone, pt still has some sob with exertion    All she takes now is h1hs  But doe not better = MMRC2 = can't walk a nl pace on a flat grade s sob but does fine slow and flat eg shopping rec No change medications for now Please remember to go to the lab and x-ray department downstairs in the basement  for your tests - we will call you with the results when they are available.     12/25/2016  f/u ov/[name] re: recurrent cough /MO/ doe with elevated BNP  Chief Complaint  Patient presents with  . Follow-up    Breathing has improved slightly. She has started coughing again- mucinex has helped. The cough is prod with yellow to green sputum.    breathing is only slightly better Cough onset  Was one week p last ov p exp to pollen but not assoc rhinitis and worse after supper and at hs  Has chlorpheniramine one hs only rec For drainage / throat tickle try take CHLORPHENIRAMINE  4 mg - take one every 4 hours as needed - available over the counter- may cause drowsiness so start with just a bedtime dose or two and see how you tolerate it before trying in daytime   Symbicort 80 Take 2 puffs first thing in am and then another 2 puffs about 12 hours later.  If not better add Try prilosec otc 20mg   Take 30-60 min before first meal of the day and Pepcid ac (famotidine) 20 mg one @  bedtime until cough is completely gone for at least a week without the need for cough suppression    01/22/2017  f/u ov/Samantha Stone re:  Cough variant asthma/ mild doe  Chief Complaint  Patient presents with  . Follow-up    1 month f/u cough variant asthma, cough has been on off, she feels SOB when she goes up the steps, still on Symbicort and thinks it is helping   this time better s adding back prednisone and not on any gerd rx  Doe= MMRC1 = can walk nl pace, flat grade, can't hurry or go uphills or steps s sob     No obvious day to day or daytime  variability or assoc excess/ purulent sputum or mucus plugs or hemoptysis or cp or chest tightness, subjective wheeze or overt sinus or hb symptoms. No unusual exp hx or h/o childhood pna/ asthma or knowledge of premature birth.  Sleeping ok without nocturnal  or early am exacerbation  of respiratory  c/o's or need for noct saba. Also denies any obvious fluctuation of symptoms with weather or environmental changes or other aggravating or alleviating factors except as outlined above   Current Medications, Allergies, Complete Past Medical History, Past Surgical History, Family History, and Social History were reviewed in [employer] record.  ROS  The following are not active complaints unless bolded sore throat, dysphagia, dental problems, itching, sneezing,  nasal congestion or excess/ purulent secretions, ear ache,   fever, chills, sweats, unintended wt loss, classically pleuritic or exertional cp,  orthopnea pnd or leg swelling, presyncope, palpitations, abdominal pain, anorexia, nausea, vomiting, diarrhea  or change in bowel or bladder habits, change in stools or urine, dysuria,hematuria,  rash, arthralgias, visual complaints, headache, numbness, weakness or ataxia or problems with walking or coordination,  change in mood/affect or memory.                   Objective:   Physical Exam  amb obese wf nad    01/22/2017      263  12/25/2016      262  11/27/2016        269  11/13/2016      266  10/23/2016       257  09/11/2016       266   08/27/16 266 lb (120.7 kg)  08/22/16 264 lb 12.8 oz (120.1 kg)  05/07/16 264 lb 1.9 oz (119.8 kg)    Vital signs reviewed  . - Note on arrival 02 sats  96% on  RA      HEENT: nl dentition, turbinates, and oropharynx which is pristine . Nl external ear canals without cough reflex   NECK :  without JVD/Nodes/TM/ nl carotid upstrokes bilaterally   LUNGS: no acc muscle use,  Nl contour chest which  is clear to A and P -     CV:  RRR  no s3 or murmur or increase in P2, nad no edema   ABD:  soft and nontender with nl inspiratory excursion in the supine position. No bruits or organomegaly appreciated, bowel sounds nl  MS:  Nl gait/ ext warm without deformities, calf tenderness, cyanosis or clubbing No obvious joint restrictions   SKIN: warm and dry without lesions    NEURO:  alert, approp, nl sensorium with  no motor or cerebellar deficits apparent.           Chemistry      Component Value Date/Time   NA 138 01/22/2017 1620   K 4.2 01/22/2017 1620   CL 101 01/22/2017 1620   CO2 31 01/22/2017 1620   BUN 16 01/22/2017 1620   CREATININE 0.96 01/22/2017 1620   CREATININE 0.92 (H) 05/21/2016 1230      Component Value Date/Time   CALCIUM 9.5 01/22/2017 1620   ALKPHOS 66 09/18/2015 1041   AST 26 09/18/2015 1041   ALT 21 09/18/2015 1041   BILITOT 1.0 09/18/2015 1041                Assessment:

## 2017-01-22 NOTE — Patient Instructions (Addendum)
Continue the symbicort 80 Take 2 puffs first thing in am and then another 2 puffs about 12 hours later.    Work on inhaler technique:  relax and gently blow all the way out then take a nice smooth deep breath back in, triggering the inhaler at same time you start breathing in.  Hold for up to 5 seconds if you can. Blow out thru nose. Rinse and gargle with water when done  If cough worsens try Try prilosec otc 20mg   Take 30-60 min before first meal of the day and Pepcid ac (famotidine) 20 mg one @  bedtime until cough is completely gone for at least a week without the need for cough suppression   Please remember to go to the lab   department downstairs in the basement  for your tests - we will call you with the results when they are available.    Please schedule a follow up visit in 3 months but call sooner if needed

## 2017-01-23 NOTE — Assessment & Plan Note (Signed)
Allergy profile 08/27/2016 >  Eos 0.4 /  IgE 11 Rast Pos dust only   - FENO 08/27/2016  =   51  Pre rx  rx with pred x 6 days and max gerd rx > 75% improvement and no worse off pred - FENO 09/11/2016  =   31 on no ICS - 09/11/2016 rec add zyrtec hs  - 10/23/2016   try symb 80 2bid  - Sinus CT 10/26/2016 > ok - 11/13/2016  After extensive coaching HFA effectiveness =    50% (late trigger) continue symb 80 2bid and add h1 prn >  Resolved so just taking h1h2hs as of 11/27/2016  - FENO 12/25/2016  =  40 with flare off ICS    > rechallenge with one week of symb 80 2bid s gerd rx or predisone this time  - 01/22/2017  After extensive coaching HFA effectiveness =    50% > continue sym 80 2bid   Overall improved on laba/ics s pred or gerd rx though if cough flares first thing to do is add back gerd rx based on the following Of the three most common causes of  Sub-acute or recurrent or chronic cough, only one (GERD)  can actually contribute to/ trigger  the other two (asthma and post nasal drip syndrome)  and perpetuate the cylce of cough.  While not intuitively obvious, many patients with chronic low grade reflux do not cough until there is a primary insult that disturbs the protective epithelial barrier and exposes sensitive nerve endings.   This is typically viral but can be direct physical injury such as with an endotracheal tube.   The point is that once this occurs, it is difficult to eliminate the cycle  using anything but a maximally effective acid suppression regimen at least in the short run, accompanied by an appropriate diet to address non acid GERD   I had an extended discussion with the patient reviewing all relevant studies completed to date and  lasting 15 to 20 minutes of a 25 minute visit    Each maintenance medication was reviewed in detail including most importantly the difference between maintenance and prns and under what circumstances the prns are to be triggered using an action plan format that  is not reflected in the computer generated alphabetically organized AVS.    Please see AVS for specific instructions unique to this visit that I personally wrote and verbalized to the the pt in detail and then reviewed with pt  by my nurse highlighting any  changes in therapy recommended at today's visit to their plan of care.

## 2017-01-23 NOTE — Assessment & Plan Note (Signed)
Trial off lopressor due to exp wheeze/cough > improved  09/11/2016 on bisoprolol   bmet ok/ no change rx needed for now > f/u cards next

## 2017-01-23 NOTE — Progress Notes (Signed)
Spoke with pt and notified of results per Dr. Wert. Pt verbalized understanding and denied any questions. 

## 2017-01-23 NOTE — Assessment & Plan Note (Signed)
Body mass index is 45.14 kg/m.  -  trending up still  Lab Results  Component Value Date   TSH 1.21 11/27/2016     Contributing to gerd risk/ doe/reviewed the need and the process to achieve and maintain neg calorie balance > defer f/u primary care including intermittently monitoring thyroid status

## 2017-01-23 NOTE — Assessment & Plan Note (Signed)
Echo 06/06/2016 Left ventricle: The cavity size was normal. Wall thickness was   increased in a pattern of mild LVH. There was moderate focal   basal hypertrophy of the septum. Systolic function was normal.   The estimated ejection fraction was in the range of 60% to 65%.   Doppler parameters are consistent with abnormal left ventricular   relaxation (grade 1 diastolic dysfunction). - Mitral valve: Calcified annulus. Mildly thickened leaflets .   There was mild regurgitation. - Left atrium:   moderately dilated. - Right atrium: The atrium was mildly dilated. - Pulmonary arteries: PA peak pressure: 38 mm Hg (S). - Spirometry 11/27/2016 wnl with nl f/v curve in effort indep portion  11/27/2016   Walked RA  2 laps @ 185 ft each stopped due to  Sob/ sats ok/ nl pace   bmet ok ok present diuretic rx, suspect her chronic doe with hc03 on high side related to borderline ohs / deconditioning/ diastolic dysfunction > cards f/u planned July 24 and no change rx needed in meantime.

## 2017-02-18 ENCOUNTER — Encounter: Payer: Self-pay | Admitting: Physician Assistant

## 2017-02-18 ENCOUNTER — Ambulatory Visit (INDEPENDENT_AMBULATORY_CARE_PROVIDER_SITE_OTHER): Payer: Medicare Other | Admitting: Physician Assistant

## 2017-02-18 ENCOUNTER — Encounter (INDEPENDENT_AMBULATORY_CARE_PROVIDER_SITE_OTHER): Payer: Self-pay

## 2017-02-18 VITALS — BP 138/64 | HR 60 | Ht 64.0 in | Wt 264.8 lb

## 2017-02-18 DIAGNOSIS — I10 Essential (primary) hypertension: Secondary | ICD-10-CM | POA: Diagnosis not present

## 2017-02-18 DIAGNOSIS — J45991 Cough variant asthma: Secondary | ICD-10-CM | POA: Diagnosis not present

## 2017-02-18 DIAGNOSIS — R0683 Snoring: Secondary | ICD-10-CM | POA: Diagnosis not present

## 2017-02-18 DIAGNOSIS — I5032 Chronic diastolic (congestive) heart failure: Secondary | ICD-10-CM

## 2017-02-18 DIAGNOSIS — I272 Pulmonary hypertension, unspecified: Secondary | ICD-10-CM

## 2017-02-18 MED ORDER — FUROSEMIDE 20 MG PO TABS: 20.0000 mg | ORAL_TABLET | Freq: Every day | ORAL | 3 refills | Status: DC

## 2017-02-18 NOTE — Patient Instructions (Addendum)
Medication Instructions:  1. CHANGE LASIX TO 20 MG ONCE A DAY EVERY DAY  Labwork: BMET TO BE DONE ON 02/22/17  Testing/Procedures: Your physician has recommended that you have a SPLIT NIGHT sleep study. This test records several body functions during sleep, including: brain activity, eye movement, oxygen and carbon dioxide blood levels, heart rate and rhythm, breathing rate and rhythm, the flow of air through your mouth and nose, snoring, body muscle movements, and chest and belly movement. NINA JONES, CMA FROM OUR OFFICE WILL CALL YOU TO SET UP STUDY    Follow-Up: 05/21/17 @ 11 AM WITH DR. Acie Fredrickson   Any Other Special Instructions Will Be Listed Below (If Applicable).     If you need a refill on your cardiac medications before your next appointment, please call your pharmacy.

## 2017-02-18 NOTE — Progress Notes (Signed)
Cardiology Office Note:    Date:  02/18/2017   ID:  Samantha Stone, DOB 1930-11-15, MRN [id number]  PCP:  Briscoe Deutscher, MD  Cardiologist:  Dr. Liam Rogers    Referring MD: Briscoe Deutscher, MD   Chief Complaint  Patient presents with  . Follow-up    CHF, HTN, Cough    History of Present Illness:    Samantha Stone is a 83 y.o. female with a hx of Diastolic HF, HTN, possible TIA, HL, hypothyroidism. Last seen by Dr. Acie Fredrickson 1/18. She is followed by Dr. Melvyn Novas for cough variant asthma.   Samantha Stone returns for Cardiology follow up. She continues to have a cough. It is improved on angiotensin receptor blocker instead of ACE inhibitor, cardioselective beta-blocker, LABA and ICS.  However, she still has a cough.  BNP in 5/18 was in the 300 range.  She does not take the Lasix often b/c of frequent urination.  She does admit to a snoring hx.  She notes dyspnea on exertion that is chronic and unchanged.  She denies chest pain, syncope, paroxysmal nocturnal dyspnea.  She denies LE edema.    Prior CV studies:   The following studies were reviewed today:  Echo 06/06/16 Mild LVH, moderate focal basal septal hypertrophy (narrow LVOT but no significant LVOT gradient or SAM to suggest HOCM), EF 03-50, grade 1 diastolic dysfunction, MAC, mild MR, moderate LAE, mild RAE, PASP 38  Carotid US 3/15 Bilateral ICA 1-39  Past Medical History:  Diagnosis Date  . Arthritis   . Cancer ([place])    basal cell skin cancer  . Concussion   . Depression   . Headache   . Hypertension   . Hypothyroidism   . Stress incontinence     Past Surgical History:  Procedure Laterality Date  . ABDOMINAL HYSTERECTOMY    . APPENDECTOMY    . CARPAL TUNNEL RELEASE     rt x2  . COLONOSCOPY    . EYE SURGERY Bilateral    cataract surgery with lens implant  . FINGER AMPUTATION Right    little finger  . I&D EXTREMITY  03/11/2012   Procedure: MINOR IRRIGATION AND DEBRIDEMENT EXTREMITY;  Surgeon: Cammie Sickle.,  MD;  Location: [facility];  Service: Orthopedics;  Laterality: Right;  Right little finger  . KNEE ARTHROSCOPY     bilat   . REPLACEMENT TOTAL KNEE BILATERAL    . TONSILLECTOMY    . TOTAL KNEE REVISION Right 03/14/2015   Procedure: TIBIAL REVISION, OPEN REDUCTION INTERNAL FIXATION PROXIMAL TIBIA;  Surgeon: Dorna Leitz, MD;  Location: [facility];  Service: Orthopedics;  Laterality: Right;  . TRIGGER FINGER RELEASE    . TUBAL LIGATION      Current Medications: Current Meds  Medication Sig  . Ascorbic Acid (VITAMIN C PO) Take 1 capsule by mouth daily.  Marland Kitchen aspirin EC 81 MG tablet Take 81 mg by mouth daily.  . beta carotene w/minerals (OCUVITE) tablet Take 1 tablet by mouth 2 (two) times daily.  Marland Kitchen BIOTIN PO Take 1 tablet by mouth daily.  . bisoprolol (ZEBETA) 5 MG tablet Take 1 tablet (5 mg total) by mouth daily.  . budesonide-formoterol (SYMBICORT) 80-4.5 MCG/ACT inhaler Inhale 2 puffs into the lungs 2 (two) times daily.  Marland Kitchen CALCIUM-VITAMIN D PO Take 1 tablet by mouth daily.  . Cholecalciferol (VITAMIN D-3) 1000 units CAPS Take 1 capsule by mouth daily.  . citalopram (CELEXA) 20 MG tablet Take 20 mg by mouth daily.  Marland Kitchen  ferrous sulfate 325 (65 FE) MG tablet Take 325 mg by mouth 2 (two) times daily with a meal.   . folic acid (FOLVITE) 924 MCG tablet Take 800 mcg by mouth daily.  Marland Kitchen levothyroxine (SYNTHROID, LEVOTHROID) 125 MCG tablet Take 125 mcg by mouth daily.  Marland Kitchen losartan (COZAAR) 50 MG tablet Take 50 mg by mouth daily.  . mirabegron ER (MYRBETRIQ) 50 MG TB24 tablet Take 50 mg by mouth daily.  . potassium chloride SA (K-DUR,KLOR-CON) 20 MEQ tablet Take 1 tablet (20 mEq total) by mouth daily.  . pravastatin (PRAVACHOL) 10 MG tablet Take 10 mg by mouth daily.  . traZODone (DESYREL) 50 MG tablet Take 50 mg by mouth at bedtime.  . [DISCONTINUED] furosemide (LASIX) 40 MG tablet Take 60 mg by mouth daily.     Allergies:   Lisinopril and Phenergan [promethazine hcl]   Social History    Social History  . Marital status: Widowed    Spouse name: N/A  . Number of children: N/A  . Years of education: N/A   Social History Main Topics  . Smoking status: Never Smoker  . Smokeless tobacco: Never Used  . Alcohol use 8.4 oz/week    14 Cans of beer per week     Comment: "couple" beers daily  . Drug use: No  . Sexual activity: Not Asked   Other Topics Concern  . None   Social History Narrative  . None     Family Hx: The patient's family history includes Bladder Cancer in her father.  ROS:   Please see the history of present illness.    Review of Systems  Respiratory: Positive for cough and shortness of breath.    All other systems reviewed and are negative.   EKGs/Labs/Other Test Reviewed:    EKG:  EKG is  ordered today.  The ekg ordered today demonstrates NSR, HR 60, normal axis, QTc 474 ms, no ST-T wave changes  Recent Labs: 08/27/2016: Hemoglobin 13.3; Platelets 285.0 11/27/2016: Pro B Natriuretic peptide (BNP) 329.0; TSH 1.21 01/22/2017: BUN 16; Creatinine, Ser 0.96; Potassium 4.2; Sodium 138   Recent Lipid Panel Lab Results  Component Value Date/Time   CHOL 154 06/16/2014 04:29 AM   TRIG 59 06/16/2014 04:29 AM   HDL 55 06/16/2014 04:29 AM   CHOLHDL 2.8 06/16/2014 04:29 AM   LDLCALC 87 06/16/2014 04:29 AM    Physical Exam:    VS:  BP 138/64   Pulse 60   Ht _0  (1.626 m)   Wt 264 lb 12.8 oz (120.1 kg)   SpO2 96%   BMI 45.45 kg/m     Wt Readings from Last 3 Encounters:  02/18/17 264 lb 12.8 oz (120.1 kg)  01/22/17 263 lb (119.3 kg)  12/25/16 262 lb (118.8 kg)     Physical Exam  Constitutional: She is oriented to person, place, and time. She appears well-developed and well-nourished. No distress.  HENT:  Head: Normocephalic and atraumatic.  Eyes: No scleral icterus.  Neck: No JVD present.  Cardiovascular: Normal rate, regular rhythm and normal heart sounds.   No murmur heard. Pulmonary/Chest: She has no wheezes. She has no rales.   Abdominal: She exhibits no mass.  Musculoskeletal: She exhibits no edema.  Neurological: She is alert and oriented to person, place, and time.  Skin: Skin is warm and dry.  Psychiatric: She has a normal mood and affect.    ASSESSMENT:    1. Chronic diastolic CHF (congestive heart failure) ([place])   2. Snoring  3. Pulmonary hypertension, unspecified ([place])   4. Essential hypertension   5. Cough variant asthma    PLAN:    In order of problems listed above:  1. Chronic diastolic CHF (congestive heart failure) ([facility]) -  Volume appears stable on exam. However, she did have an elevated BNP earlier this year. She's had significant issues with continued dyspnea with exertion. She also has a chronic cough. I suspect her dyspnea and cough are multifactorial and related to cough variant asthma, obesity, diastolic HF.  She has not been taking her Lasix consistently due to frequent urination.  I have suggested that she take Lasix 20 mg daily to see if she can tolerate this dose. Check a BMET in one week.  2. Snoring -  She does admit to a history of snoring. She had moderate left atrial enlargement and mildly elevated upon pressures on echocardiogram 11/17.   -  Plan: Split night study  3. Pulmonary hypertension, unspecified ([place]) -  Plan: Split night study  4.Essential hypertension -  The patient's blood pressure is controlled on her current regimen.  Continue current therapy.    5. Cough variant asthma FU with Pulmonology as planned.    Dispo:  Return in about 3 months (around 05/21/2017) for Routine Follow Up, w/ Dr. Acie Fredrickson.   Medication Adjustments/Labs and Tests Ordered: Current medicines are reviewed at length with the patient today.  Concerns regarding medicines are outlined above.  Tests Ordered: Orders Placed This Encounter  Procedures  . Basic Metabolic Panel (BMET)  . EKG 12-Lead  . Split night study   Medication Changes: Meds ordered this encounter  Medications  .  furosemide (LASIX) 20 MG tablet    Sig: Take 1 tablet (20 mg total) by mouth daily.    Dispense:  90 tablet    Refill:  3    Signed, Richardson Dopp, PA-C  02/18/2017 4:57 PM    [facility] [address], [place], [place]  [address] Phone: [phone]; Fax: [phone]

## 2017-02-22 ENCOUNTER — Other Ambulatory Visit: Payer: Medicare Other | Admitting: *Deleted

## 2017-02-22 DIAGNOSIS — I5032 Chronic diastolic (congestive) heart failure: Secondary | ICD-10-CM

## 2017-02-22 LAB — BASIC METABOLIC PANEL
BUN/Creatinine Ratio: 14 (ref 12–28)
BUN: 12 mg/dL (ref 8–27)
CO2: 27 mmol/L (ref 20–29)
Calcium: 9.4 mg/dL (ref 8.7–10.3)
Chloride: 98 mmol/L (ref 96–106)
Creatinine, Ser: 0.87 mg/dL (ref 0.57–1.00)
GFR calc Af Amer: 71 mL/min/{1.73_m2} (ref >59)
GFR calc non Af Amer: 61 mL/min/{1.73_m2} (ref >59)
Glucose: 88 mg/dL (ref 65–99)
Potassium: 4.5 mmol/L (ref 3.5–5.2)
Sodium: 139 mmol/L (ref 134–144)

## 2017-03-28 ENCOUNTER — Telehealth: Payer: Self-pay | Admitting: *Deleted

## 2017-03-28 NOTE — Telephone Encounter (Signed)
[place], [place]    03/28/2017 2:07 PM

## 2017-03-28 NOTE — Telephone Encounter (Signed)
Reached out to patient to schedule her split night sleep study and patient states she sleeps well and does not think she needs a sleep study. Patient has declined having the sleep study at this time.

## 2017-03-28 NOTE — Telephone Encounter (Signed)
-----   Message from Michae Kava, [place] sent at 02/18/2017  2:53 PM EDT ----- Regarding: La[place],  Pt saw Nicki Reaper today and is needing to be scheduled for a Split Night Sleep Study, dx snoring and pulmonary HTN. Pt will be in [place] and will not be back for a couple of weeks. Pt asked to call her in 3 weeks to schedule study.   Thank you Arbie Cookey

## 2017-03-28 NOTE — Telephone Encounter (Signed)
I will route message from Gershon Cull, [place] to Richardson Dopp, [place] as Juluis Rainier  in regards to pt declined Sleep Study at this time.

## 2017-04-19 ENCOUNTER — Other Ambulatory Visit: Payer: Self-pay | Admitting: Cardiovascular Disease

## 2017-04-24 ENCOUNTER — Encounter: Payer: Self-pay | Admitting: Internal Medicine

## 2017-04-24 ENCOUNTER — Ambulatory Visit (INDEPENDENT_AMBULATORY_CARE_PROVIDER_SITE_OTHER): Payer: Medicare Other | Admitting: Internal Medicine

## 2017-04-24 VITALS — BP 128/74 | HR 64 | Ht 64.0 in | Wt 263.0 lb

## 2017-04-24 DIAGNOSIS — J45991 Cough variant asthma: Secondary | ICD-10-CM | POA: Diagnosis not present

## 2017-04-24 DIAGNOSIS — R0609 Other forms of dyspnea: Secondary | ICD-10-CM | POA: Diagnosis not present

## 2017-04-24 DIAGNOSIS — R0600 Dyspnea, unspecified: Secondary | ICD-10-CM

## 2017-04-24 NOTE — Progress Notes (Signed)
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 Pulmonary Consult    Referred by Dr. Brandt Loosen.  Pt c/o cough for the past year.  Cough is worse late in the evenings. She is coughing up green sputum.    no prior h/o cough/seasonal allergies/ asthma or sinus problem with new indolent  onset of cough winter 2017 persistent since then  esp before supper while sitting watching TV in recliner and slt green mucus poduction x 6 m and mild sob no better on inhalers and ent eval by [place] no dx and p trazadone at hs sleeps fine and best time of day is am then gradually worse as day goes on - no better on augmentin - worse on mint > severe cough to point of choking rec GERD  Diet  Pantoprazole (protonix) 40 mg   Take  30-60 min before first meal of the day and Pepcid (famotidine)  20 mg one @  bedtime until return to office - this is the best way to tell whether stomach acid is contributing to your problem. Augmentin 875 mg take one pill twice daily  X 10 days - take at breakfast and supper with large glass of water.  It would help reduce the usual side effects (diarrhea and yeast infections) if you ate cultured yogurt at lunch.  Prednisone 10 mg take  4 each am x 2 days,   2 each am x 2 days,  1 each am x 2 days and stop  Stop lopressor and start bisoprolol 5 mg twice daily        09/11/2016  f/u ov/Kennon Encinas re: cough x winter of 2017 / hbp on new meds  Chief Complaint  Patient presents with  . Follow-up    Cough has improved some, but has not completely resolved.  She still has occ green to clear sputum production.   says about 75% better, min discolored mucus and daytime only,  never noct or early am and no assoc sob  rec Stay on pantoprazole before bfast and pepcid at bedtime unitl return  Stay on the diet  Ok to try Zyrtec either at bedtime or in am but may make you sleepy but if can't tolerate it get the clariton  If cough worsens call [facility] at [phone] to schedule sinus CT before next visit    10/23/2016  f/u ov/Renzo Vincelette re:  Cough ? Etiology x winter 2017 on ppi qam/ h2 hs  Chief Complaint  Patient presents with  . Follow-up    f/u for chronic cough. Cough has improved slightly since last visit.   cough is worse p supper, congested sounding but not much mucus, worse on deep inspiration  rec Change protonix Take 30- 60 min before your first and last meals of the day x 2 weeks and continue pepcid 20 mg at bedtime  Work on inhaler technique:  Symbicort 80 Take 2 puffs first thing in am and then another 2 puffs about 12 hours later.  GERD (REFLUX Please see patient coordinator before you leave today  to schedule sinus CT     11/13/2016  f/u ov/Keyasha Miah re:  Cough ? Asthma  Onset winter 2017 Chief Complaint  Patient presents with  . Follow-up    Pt states cough has changed since the last visit and is "irritable"- she has found that cough drops with honey have helped.   not using hfa effectively, best rx so far was prednisone and trip to [place] Cough is worse at hs  Not limited by breathing from desired activities   rec For drainage / throat tickle try take CHLORPHENIRAMINE  4 mg - take one every 4 hours as needed - available over the counter- may cause drowsiness so start with just a bedtime dose or two and see how you tolerate it before trying in daytime   Continue symbicort 80 Take 2 puffs first thing in am and then another 2 puffs about 12 hours later.  Work on inhaler technique:  Prednisone 10 mg take  4 each am x 2 days,   2 each am x 2 days,  1 each am x 2 days and stop   11/27/2016  f/u ov/Aqua Denslow re: cough since winter 2017 resolved so stopped  symb and gerd rx x one week  And no worse  Chief Complaint  Patient presents with  . Follow-up    Pt states cough is gone, pt still has some sob with exertion    All she takes now is h1hs  But doe not better = MMRC2 = can't walk a nl pace on a flat grade s sob but does fine slow and flat eg shopping rec No change medications for now      12/25/2016  f/u ov/Ziad Maye re: recurrent cough /MO/ doe with elevated BNP  Chief Complaint  Patient presents with  . Follow-up    Breathing has improved slightly. She has started coughing again- mucinex has helped. The cough is prod with yellow to green sputum.    breathing is only slightly better Cough onset  Was one week p last ov p exp to pollen but not assoc rhinitis and worse after supper and at hs  Has chlorpheniramine one hs only rec For drainage / throat tickle try take CHLORPHENIRAMINE  4 mg - take one every 4 hours as needed - available over the counter- may cause drowsiness so start with just a bedtime dose or two and see how you tolerate it before trying in daytime   Symbicort 80 Take 2 puffs first thing in am and then another 2 puffs about 12 hours later.  If not better add Try prilosec otc 20mg   Take 30-60 min before first meal of the day and Pepcid ac (famotidine) 20 mg one @  bedtime until cough is completely gone for at least a week without the need for cough suppression    01/22/2017  f/u ov/Agueda Houpt re:  Cough variant asthma/ mild doe  Chief Complaint  Patient presents with  . Follow-up    1 month f/u cough variant asthma, cough has been on off, she feels SOB when she goes up the steps, still on Symbicort and thinks it is helping   this time better s adding back prednisone and not on any gerd rx  Doe= MMRC1 = can walk nl pace, flat grade, can't hurry or go uphills or steps s sob   rec Continue the symbicort 80 Take 2 puffs first thing in am and then another 2 puffs about 12 hours later.  Work on inhaler technique:   If cough worsens try  Try prilosec otc 20mg   Take 30-60  min before first meal of the day and Pepcid ac (famotidine) 20 mg one @  bedtime until cough is completely gone for at least a week without the need for cough suppression    04/24/2017  f/u ov/Rilley Poulter re: cough variant asthma vs uacs  Chief Complaint  Patient presents with  . Follow-up    Cough is much improved for the past wk. She is not coughing up any mucus.  She is sleeping better and is only using Symbicort as needed.    last used symb one week prior to OV  No worse off it  Doe still  X MMRC1 = can walk nl pace, flat grade, can't hurry or go uphills or steps s sob   sleeping well on chlortrimeton x 2 hs   No obvious day to day or daytime variability or assoc excess/ purulent sputum or mucus plugs or hemoptysis or cp or chest tightness, subjective wheeze or overt sinus or hb symptoms. No unusual exp hx or h/o childhood pna/ asthma or knowledge of premature birth.  Sleeping ok flat without nocturnal  or early am exacerbation  of respiratory  c/o's or need for noct saba. Also denies any obvious fluctuation of symptoms with weather or environmental changes or other aggravating or alleviating factors except as outlined above   Current Allergies, Complete Past Medical History, Past Surgical History, Family History, and Social History were reviewed in [employer] record.  ROS  The following are not active complaints unless bolded sore throat, dysphagia, dental problems, itching, sneezing,  nasal congestion or disharge of excess mucus or purulent secretions, ear ache,   fever, chills, sweats, unintended wt loss or wt gain, classically pleuritic or exertional cp,  orthopnea pnd or leg swelling better , presyncope, palpitations, abdominal pain, anorexia, nausea, vomiting, diarrhea  or change in bowel habits or bladder habits, change in stools or change in urine, dysuria, hematuria,  rash, arthralgias, visual complaints, headache, numbness, weakness  or ataxia or problems with walking or coordination,  change in mood/affect or memory.        Current Meds  Medication Sig  . Ascorbic Acid (VITAMIN C PO) Take 1 capsule by mouth daily.  Marland Kitchen aspirin EC 81 MG tablet Take 81 mg by mouth daily.  . beta carotene w/minerals (OCUVITE) tablet Take 1 tablet by mouth 2 (two) times daily.  . bisoprolol (ZEBETA) 5 MG tablet Take 1 tablet (5 mg total) by mouth daily.  . budesonide-formoterol (SYMBICORT) 80-4.5 MCG/ACT inhaler Inhale 2 puffs into the lungs 2 (two) times daily. (Patient taking differently: Inhale 2 puffs into the lungs 2 (two) times daily as needed. )  . CALCIUM-VITAMIN D PO Take 1 tablet by mouth daily.  . chlorpheniramine (CHLOR-TRIMETON) 4 MG tablet Take 8 mg by mouth at bedtime as needed for allergies.  . Cholecalciferol (VITAMIN D-3) 1000 units CAPS Take 1 capsule by mouth daily.  . citalopram (CELEXA) 20 MG tablet Take 20 mg by mouth daily.  . ferrous sulfate 325 (65 FE) MG tablet Take 325 mg by mouth 2 (two) times daily with a meal.   . folic acid (FOLVITE) 657 MCG tablet Take 800 mcg by mouth daily.  . furosemide (LASIX) 20 MG tablet Take 1 tablet (20 mg total) by mouth daily.  Marland Kitchen KLOR-CON M20 20 MEQ tablet TAKE 1 TABLET (20 MEQ TOTAL) BY MOUTH DAILY.  Marland Kitchen levothyroxine (SYNTHROID, LEVOTHROID) 125 MCG tablet Take 125 mcg by mouth daily.  Marland Kitchen losartan (COZAAR) 50 MG tablet Take  50 mg by mouth daily.  . mirabegron ER (MYRBETRIQ) 50 MG TB24 tablet Take 50 mg by mouth daily.  . pravastatin (PRAVACHOL) 10 MG tablet Take 10 mg by mouth daily.  . traZODone (DESYREL) 50 MG tablet Take 50 mg by mouth at bedtime.                     Objective:   Physical Exam  amb obese wf nad  - all smiles  04/24/2017      263  01/22/2017      263  12/25/2016      262  11/27/2016        269  11/13/2016      266  10/23/2016       257  09/11/2016       266   08/27/16 266 lb (120.7 kg)  08/22/16 264 lb 12.8 oz (120.1 kg)  05/07/16 264 lb 1.9 oz (119.8  kg)    Vital signs reviewed  . - Note on arrival 02 sats  96% on  RA      HEENT: nl dentition, turbinates, and oropharynx which is pristine . Nl external ear canals without cough reflex   NECK :  without JVD/Nodes/TM/ nl carotid upstrokes bilaterally   LUNGS: no acc muscle use,  Nl contour chest which is clear to A and P -     CV:  RRR  no s3 or murmur or increase in P2, nad - trace sym  bilateral lower ext pitting  edema   ABD:  soft and nontender with nl inspiratory excursion in the supine position. No bruits or organomegaly appreciated, bowel sounds nl  MS:  Nl gait/ ext warm without deformities, calf tenderness, cyanosis or clubbing No obvious joint restrictions   SKIN: warm and dry without lesions    NEURO:  alert, approp, nl sensorium with  no motor or cerebellar deficits apparent.             Assessment:

## 2017-04-24 NOTE — Patient Instructions (Addendum)
If breathing worse > restart symbicort 80 Take 2 puffs first thing in am and then another 2 puffs about 12 hours later.   No other change in medications  Please schedule a follow up visit in 3 months but call sooner if needed  Add:  Consider trial of singulair next if flares on home remedy and doesn't respond to resuming symb 80

## 2017-04-27 NOTE — Assessment & Plan Note (Signed)
Body mass index is 45.14 kg/m.  -  trending no change  Lab Results  Component Value Date   TSH 1.21 11/27/2016     Contributing to gerd risk/ doe/reviewed the need and the process to achieve and maintain neg calorie balance > defer f/u primary care including intermittently monitoring thyroid status

## 2017-04-27 NOTE — Assessment & Plan Note (Signed)
Allergy profile 08/27/2016 >  Eos 0.4 /  IgE 11 Rast Pos dust only   - FENO 08/27/2016  =   49  Pre rx  rx with pred x 6 days and max gerd rx > 75% improvement and no worse off pred - FENO 09/11/2016  =   31 on no ICS - 09/11/2016 rec add zyrtec hs  - 10/23/2016   try symb 80 2bid  - Sinus CT 10/26/2016 > ok - 11/13/2016  After extensive coaching HFA effectiveness =    50% (late trigger) continue symb 80 2bid and add h1 prn >  Resolved so just taking h1h2hs as of 11/27/2016  - FENO 12/25/2016  =  40 with flare off ICS    > rechallenge with one week of symb 80 2bid s gerd rx or predisone this time  - 01/22/2017  After extensive coaching HFA effectiveness =    50% > continue sym 80 2bid  - better 04/24/2017 p off symb x 2 weeks so ok to leave off for now   Favor uacs over cough variant asthma at this point so ok to try off symbicort noting that Based on the study from [place]  378; 20 p 1865 (2018) in pts with mild asthma it is reasonable to use low dose symbicort eg 80 2bid "prn" flare in this setting but I emphasized this was only shown with symbicort and takes advantage of the rapid onset of action but is not the same as "rescue therapy" but can be stopped once the acute symptoms have resolved     Each maintenance medication was reviewed in detail including most importantly the difference between maintenance and as needed and under what circumstances the prns are to be used.  Please see AVS for specific  Instructions which are unique to this visit and I personally typed out  which were reviewed in detail in writing with the patient and a copy provided.

## 2017-04-27 NOTE — Assessment & Plan Note (Signed)
Echo 06/06/2016 Left ventricle: The cavity size was normal. Wall thickness was   increased in a pattern of mild LVH. There was moderate focal   basal hypertrophy of the septum. Systolic function was normal.   The estimated ejection fraction was in the range of 60% to 65%.   Doppler parameters are consistent with abnormal left ventricular   relaxation (grade 1 diastolic dysfunction). - Mitral valve: Calcified annulus. Mildly thickened leaflets .   There was mild regurgitation. - Left atrium:   moderately dilated. - Right atrium: The atrium was mildly dilated. - Pulmonary arteries: PA peak pressure: 38 mm Hg (S). - Spirometry 11/27/2016 wnl with nl f/v curve in effort indep portion  11/27/2016   Walked RA  2 laps @ 185 ft each stopped due to  Sob/ sats ok/ nl pace   - 04/24/2017  Walked RA x 3 laps @ 185 ft each stopped due to  End of study, nl pace, no   desat   Min sob  No further w/u needed for now / suspect all obesity / deconditioning/ diastolic dysfunction

## 2017-05-21 ENCOUNTER — Encounter: Payer: Self-pay | Admitting: Cardiovascular Disease

## 2017-05-21 ENCOUNTER — Ambulatory Visit (INDEPENDENT_AMBULATORY_CARE_PROVIDER_SITE_OTHER): Payer: Medicare Other | Admitting: Cardiovascular Disease

## 2017-05-21 VITALS — BP 128/70 | HR 71 | Ht 64.0 in | Wt 263.4 lb

## 2017-05-21 DIAGNOSIS — I5032 Chronic diastolic (congestive) heart failure: Secondary | ICD-10-CM | POA: Diagnosis not present

## 2017-05-21 DIAGNOSIS — I10 Essential (primary) hypertension: Secondary | ICD-10-CM

## 2017-05-21 DIAGNOSIS — E782 Mixed hyperlipidemia: Secondary | ICD-10-CM

## 2017-05-21 LAB — HEPATIC FUNCTION PANEL
ALT: 15 IU/L (ref 0–32)
AST: 15 IU/L (ref 0–40)
Albumin: 3.7 g/dL (ref 3.5–4.7)
Alkaline Phosphatase: 89 IU/L (ref 39–117)
Bilirubin Total: 0.4 mg/dL (ref 0.0–1.2)
Bilirubin, Direct: 0.13 mg/dL (ref 0.00–0.40)
Total Protein: 6.1 g/dL (ref 6.0–8.5)

## 2017-05-21 LAB — BASIC METABOLIC PANEL
BUN/Creatinine Ratio: 13 (ref 12–28)
BUN: 14 mg/dL (ref 8–27)
CO2: 27 mmol/L (ref 20–29)
Calcium: 9.4 mg/dL (ref 8.7–10.3)
Chloride: 97 mmol/L (ref 96–106)
Creatinine, Ser: 1.06 mg/dL — ABNORMAL HIGH (ref 0.57–1.00)
GFR calc Af Amer: 56 mL/min/{1.73_m2} — ABNORMAL LOW (ref >59)
GFR calc non Af Amer: 48 mL/min/{1.73_m2} — ABNORMAL LOW (ref >59)
Glucose: 105 mg/dL — ABNORMAL HIGH (ref 65–99)
Potassium: 5.1 mmol/L (ref 3.5–5.2)
Sodium: 137 mmol/L (ref 134–144)

## 2017-05-21 LAB — LIPID PANEL
Chol/HDL Ratio: 4 ratio (ref 0.0–4.4)
Cholesterol, Total: 191 mg/dL (ref 100–199)
HDL: 48 mg/dL (ref >39)
LDL Calculated: 105 mg/dL — ABNORMAL HIGH (ref 0–99)
Triglycerides: 192 mg/dL — ABNORMAL HIGH (ref 0–149)
VLDL Cholesterol Cal: 38 mg/dL (ref 5–40)

## 2017-05-21 NOTE — Progress Notes (Signed)
Cardiology Office Note   Date:  05/21/2017   ID:  Samantha Stone, DOB [birth year], MRN [id number]  PCP:  [name], MD  Cardiologist:   [name], MD   Chief Complaint  Patient presents with  . Congestive Heart Failure   1. Hypertension 2. Possible TIAs - mental status changes , difficulty talking.  3. Hypothyroidism 4. Hyperlipidemia 5.   History of Present Illness:  Samantha Stone is an 83 yo who I have known for many years. 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   Samantha Stone has fallen several times over the past several months. She slipped on the wet floor on the ceramic tile. She presents for further management of her HTN. Her BP has been irregular at home. Her diet is fairly consistent. She does not eat much salt. She has frozen lasagna on occasion. Hot dog once a month or so.   She was admitted to [facility] in March, 2015 for mental confusion and HTN Echo showed:  Left ventricle: The cavity size was normal. Wall thickness was at the upper limits of normal. Systolic function was normal. The estimated ejection fraction was in the range of 60% to 65%. Wall motion was normal; there were no regional wall motion abnormalities. Doppler parameters are consistent with abnormal left ventricular relaxation (grade 1 diastolic dysfunction). - Mitral valve: Mild regurgitation. - Left atrium: The atrium was moderately to severely dilated. - Pulmonary arteries: Systolic pressure was mildly increased. PA peak pressure: 76mm Hg    Oct. 20, 2015:  Samantha Stone is doing well. She had her cataracts done.  No CP or dyspnea.    Feb. 19, 2016:   Samantha Stone is a 83 y.o. female who presents for her HTN and hyperlipidemia. Family issues,  - Marden Noble was placed in a nursing home.  Step daughter challenged.  Has had a rough 3-4 months.   Has not been exerciseing, diet has not been as strict recently.   Oct. 9, 2017:  Samantha Stone is seen today for follow up visit Doing  well Has had a chronic cough.   Was diagnosed with CHF - she had an echo in 2015 that showed normal LV systolic function and grade 1 diastolic dysfunction   Has signficant DOE  - gets DOE walking to her mailbox  She was started on Lasix 10 mg a day but has not noticed any increase in her urine output.  Jan. 24, 2018:  Still has a cough.    Is now on Lasix 60 mg a day  Has been to ENT  Going to see Dr. Melvyn Novas soon .  BP has been elevated.   Oct. 23, 2018: Samantha Stone is seen today for follow up of her chronic diastolic congestive heart failure Still has a chronic cough.  Slightly better recently  Has low stamina  Doing better on Lasix 20 mg a day instead of 40 mg   Past Medical History:  Diagnosis Date  . Arthritis   . Cancer ([place])    basal cell skin cancer  . Concussion   . Depression   . Headache   . Hypertension   . Hypothyroidism   . Stress incontinence     Past Surgical History:  Procedure Laterality Date  . ABDOMINAL HYSTERECTOMY    . APPENDECTOMY    . CARPAL TUNNEL RELEASE     rt x2  . COLONOSCOPY    . EYE SURGERY Bilateral    cataract surgery with lens implant  . FINGER AMPUTATION Right  little finger  . I&D EXTREMITY  03/11/2012   Procedure: MINOR IRRIGATION AND DEBRIDEMENT EXTREMITY;  Surgeon: Cammie Sickle., MD;  Location: [place];  Service: Orthopedics;  Laterality: Right;  Right little finger  . KNEE ARTHROSCOPY     bilat   . REPLACEMENT TOTAL KNEE BILATERAL    . TONSILLECTOMY    . TOTAL KNEE REVISION Right 03/14/2015   Procedure: TIBIAL REVISION, OPEN REDUCTION INTERNAL FIXATION PROXIMAL TIBIA;  Surgeon: Dorna Leitz, MD;  Location: [place];  Service: Orthopedics;  Laterality: Right;  . TRIGGER FINGER RELEASE    . TUBAL LIGATION       Current Outpatient Prescriptions  Medication Sig Dispense Refill  . Ascorbic Acid (VITAMIN C PO) Take 1 capsule by mouth daily.    Marland Kitchen aspirin EC 81 MG tablet Take 81 mg by mouth daily.    . beta  carotene w/minerals (OCUVITE) tablet Take 1 tablet by mouth 2 (two) times daily.    . bisoprolol (ZEBETA) 5 MG tablet Take 1 tablet (5 mg total) by mouth daily. 60 tablet 11  . budesonide-formoterol (SYMBICORT) 80-4.5 MCG/ACT inhaler Inhale 2 puffs into the lungs 2 (two) times daily. (Patient taking differently: Inhale 2 puffs into the lungs 2 (two) times daily as needed. ) 1 Inhaler 12  . CALCIUM-VITAMIN D PO Take 1 tablet by mouth daily.    . chlorpheniramine (CHLOR-TRIMETON) 4 MG tablet Take 8 mg by mouth at bedtime as needed for allergies.    . Cholecalciferol (VITAMIN D-3) 1000 units CAPS Take 1 capsule by mouth daily.    . citalopram (CELEXA) 20 MG tablet Take 20 mg by mouth daily.    . ferrous sulfate 325 (65 FE) MG tablet Take 325 mg by mouth 2 (two) times daily with a meal.     . folic acid (FOLVITE) 709 MCG tablet Take 800 mcg by mouth daily.    Marland Kitchen KLOR-CON M20 20 MEQ tablet TAKE 1 TABLET (20 MEQ TOTAL) BY MOUTH DAILY. 90 tablet 2  . levothyroxine (SYNTHROID, LEVOTHROID) 125 MCG tablet Take 125 mcg by mouth daily.    Marland Kitchen losartan (COZAAR) 50 MG tablet Take 50 mg by mouth daily.    . mirabegron ER (MYRBETRIQ) 50 MG TB24 tablet Take 50 mg by mouth daily.    . pravastatin (PRAVACHOL) 10 MG tablet Take 10 mg by mouth daily.    . traZODone (DESYREL) 50 MG tablet Take 50 mg by mouth at bedtime.    . furosemide (LASIX) 20 MG tablet Take 1 tablet (20 mg total) by mouth daily. 90 tablet 3   No current facility-administered medications for this visit.     Allergies:   Lisinopril and Phenergan [promethazine hcl]    Social History:  The patient  reports that she has never smoked. She has never used smokeless tobacco. She reports that she drinks about 8.4 oz of alcohol per week . She reports that she does not use drugs.   Family History:  The patient's family history includes Bladder Cancer in her father.    ROS:  Please see the history of present illness.    Review of  Systems: Constitutional:  denies fever, chills, diaphoresis, appetite change and fatigue.  HEENT: denies photophobia, eye pain, redness, hearing loss, ear pain, congestion, sore throat, rhinorrhea, sneezing, neck pain, neck stiffness and tinnitus.  Respiratory: denies SOB, DOE, cough, chest tightness, and wheezing.  Cardiovascular: denies chest pain, palpitations and leg swelling.  Gastrointestinal: denies nausea, vomiting, abdominal pain,  diarrhea, constipation, blood in stool.  Genitourinary: denies dysuria, urgency, frequency, hematuria, flank pain and difficulty urinating.  Musculoskeletal: denies  myalgias, back pain, joint swelling, arthralgias and gait problem.   Skin: denies pallor, rash and wound.  Neurological: denies dizziness, seizures, syncope, weakness, light-headedness, numbness and headaches.   Hematological: denies adenopathy, easy bruising, personal or family bleeding history.  Psychiatric/ Behavioral: denies suicidal ideation, mood changes, confusion, nervousness, sleep disturbance and agitation.       All other systems are reviewed and negative.    Physical Exam: Blood pressure 128/70, pulse 71, height 5\' 4"  (1.626 m), weight 263 lb 6.4 oz (119.5 kg), SpO2 97 %.  GEN:  Well nourished, well developed in no acute distress HEENT: Normal NECK: No JVD; No carotid bruits LYMPHATICS: No lymphadenopathy CARDIAC: RR, no murmurs, rubs, gallops RESPIRATORY:  Clear to auscultation without rales, wheezing or rhonchi  ABDOMEN: Soft, non-tender, non-distended MUSCULOSKELETAL:  No edema; No deformity  SKIN: Warm and dry NEUROLOGIC:  Alert and oriented x 3    EKG:     Recent Labs: 08/27/2016: Hemoglobin 13.3; Platelets 285.0 11/27/2016: Pro B Natriuretic peptide (BNP) 329.0; TSH 1.21 02/22/2017: BUN 12; Creatinine, Ser 0.87; Potassium 4.5; Sodium 139    Lipid Panel    Component Value Date/Time   CHOL 154 06/16/2014 0429   TRIG 59 06/16/2014 0429   HDL 55 06/16/2014  0429   CHOLHDL 2.8 06/16/2014 0429   VLDL 12 06/16/2014 0429   LDLCALC 87 06/16/2014 0429      Wt Readings from Last 3 Encounters:  05/21/17 263 lb 6.4 oz (119.5 kg)  04/24/17 263 lb (119.3 kg)  02/18/17 264 lb 12.8 oz (120.1 kg)      Other studies Reviewed: Additional studies/ records that were reviewed today include: . Review of the above records demonstrates:    ASSESSMENT AND PLAN:  1. Hypertension -  BP is well controlled   2. Possible TIAs - no recurrent TIAs   3. Hypothyroidism - managed by primary care  4. Hyperlipidemia -    Has not had labs checked in quite a while. We'll check fasting lipids  And complete medical profile today.  5.  DOE, cough / chronic diastolic CHF Her lungs are clear. Continue current medications. Blood pressure is well-controlled. Think a lot of her fatigue isdue to generalized deconditioning. I've recommended that she start a good exercise program.  Current medicines are reviewed at length with the patient today.  The patient does not have concerns regarding medicines.  The following changes have been made:  no change     Signed, [name], MD  05/21/2017 11:13 AM    [facility] [address], [place], [place]  [address] Phone: [phone]; Fax: [phone]

## 2017-05-21 NOTE — Patient Instructions (Signed)
Medication Instructions:  Your physician recommends that you continue on your current medications as directed. Please refer to the Current Medication list given to you today.   Labwork: TODAY - cholesterol, liver panel, basic metabolic panel   Testing/Procedures: None Ordered   Follow-Up: Your physician wants you to follow-up in: 6 months with Dr. Nahser. You will receive a reminder letter in the mail two months in advance. If you don't receive a letter, please call our office to schedule the follow-up appointment.   If you need a refill on your cardiac medications before your next appointment, please call your pharmacy.   Thank you for choosing [facility]! Makynna Manocchio, RN [phone]    

## 2017-05-22 ENCOUNTER — Telehealth: Payer: Self-pay

## 2017-05-22 NOTE — Telephone Encounter (Signed)
spoke with patient about recent klab results. patient verbalized understanding. patiet requested a copy mailed to her. mailed copy. patient thanked me for my call.

## 2017-05-22 NOTE — Telephone Encounter (Signed)
-----   Message from Thayer Headings, MD sent at 05/21/2017  5:11 PM EDT ----- Katherene Ponto are elevated. Needs to watch diet and exercise more

## 2017-07-02 ENCOUNTER — Other Ambulatory Visit: Payer: Self-pay | Admitting: Internal Medicine

## 2017-07-02 MED ORDER — METOPROLOL TARTRATE 25 MG PO TABS: 25.0000 mg | ORAL_TABLET | Freq: Two times a day (BID) | ORAL | 11 refills | Status: DC

## 2017-07-15 ENCOUNTER — Ambulatory Visit: Payer: Medicare Other | Admitting: Internal Medicine

## 2018-01-13 ENCOUNTER — Other Ambulatory Visit: Payer: Self-pay | Admitting: Cardiovascular Disease

## 2018-02-26 ENCOUNTER — Other Ambulatory Visit: Payer: Self-pay | Admitting: Physician Assistant

## 2018-04-14 ENCOUNTER — Other Ambulatory Visit: Payer: Self-pay | Admitting: Cardiovascular Disease

## 2018-05-09 ENCOUNTER — Other Ambulatory Visit: Payer: Self-pay | Admitting: Cardiovascular Disease

## 2018-05-20 ENCOUNTER — Encounter: Payer: Self-pay | Admitting: Cardiovascular Disease

## 2018-06-05 ENCOUNTER — Other Ambulatory Visit: Payer: Self-pay | Admitting: Cardiovascular Disease

## 2018-06-10 ENCOUNTER — Ambulatory Visit: Payer: Medicare Other | Admitting: Cardiovascular Disease

## 2018-06-10 ENCOUNTER — Encounter: Payer: Self-pay | Admitting: Cardiovascular Disease

## 2018-06-10 VITALS — BP 144/62 | HR 71 | Ht 64.0 in | Wt 263.8 lb

## 2018-06-10 DIAGNOSIS — I5032 Chronic diastolic (congestive) heart failure: Secondary | ICD-10-CM | POA: Diagnosis not present

## 2018-06-10 NOTE — Patient Instructions (Signed)

## 2018-06-10 NOTE — Progress Notes (Signed)
Cardiology Office Note   Date:  06/10/2018   ID:  ETNA FORQUER, DOB 1931-05-11, MRN [id number]  PCP:  [name], MD  Cardiologist:   [name], MD   Chief Complaint  Patient presents with  . Congestive Heart Failure   1. Hypertension 2. Possible TIAs - mental status changes , difficulty talking.  3. Hypothyroidism 4. Hyperlipidemia 5.      Samantha Stone is an 84 yo who I have known for many years. 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   Samantha Stone has fallen several times over the past several months. She slipped on the wet floor on the ceramic tile. She presents for further management of her HTN. Her BP has been irregular at home. Her diet is fairly consistent. She does not eat much salt. She has frozen lasagna on occasion. Hot dog once a month or so.   She was admitted to [facility] in March, 2015 for mental confusion and HTN Echo showed:  Left ventricle: The cavity size was normal. Wall thickness was at the upper limits of normal. Systolic function was normal. The estimated ejection fraction was in the range of 60% to 65%. Wall motion was normal; there were no regional wall motion abnormalities. Doppler parameters are consistent with abnormal left ventricular relaxation (grade 1 diastolic dysfunction). - Mitral valve: Mild regurgitation. - Left atrium: The atrium was moderately to severely dilated. - Pulmonary arteries: Systolic pressure was mildly increased. PA peak pressure: 40mm Hg    Oct. 20, 2015:  Samantha Stone is doing well. She had her cataracts done.  No CP or dyspnea.    Feb. 19, 2016:   Samantha Stone is a 84 y.o. female who presents for her HTN and hyperlipidemia. Family issues,  - Marden Noble was placed in a nursing home.  Step daughter challenged.  Has had a rough 3-4 months.   Has not been exerciseing, diet has not been as strict recently.   Oct. 9, 2017:  Samantha Stone is seen today for follow up visit Doing well Has had a chronic cough.    Was diagnosed with CHF - she had an echo in 2015 that showed normal LV systolic function and grade 1 diastolic dysfunction   Has signficant DOE  - gets DOE walking to her mailbox  She was started on Lasix 10 mg a day but has not noticed any increase in her urine output.  Jan. 24, 2018:  Still has a cough.    Is now on Lasix 60 mg a day  Has been to ENT  Going to see Dr. Melvyn Novas soon .  BP has been elevated.   Oct. 23, 2018: Samantha Stone is seen today for follow up of her chronic diastolic congestive heart failure Still has a chronic cough.  Slightly better recently  Has low stamina  Doing better on Lasix 20 mg a day instead of 40 mg   June 10, 2018: Samantha Stone is seen today for follow-up of her chronic diastolic congestive heart failure. She now lives at [facility] Walks , wears her Fitbit.     Wt today is 264 lbs.  Gets fatigued  Had a cough for years but now it has resolved.  BP has been normal recently . Is a bit high today   Past Medical History:  Diagnosis Date  . Arthritis   . Cancer ([place])    basal cell skin cancer  . Concussion   . Depression   . Headache   . Hypertension   . Hypothyroidism   .  Stress incontinence     Past Surgical History:  Procedure Laterality Date  . ABDOMINAL HYSTERECTOMY    . APPENDECTOMY    . CARPAL TUNNEL RELEASE     rt x2  . COLONOSCOPY    . EYE SURGERY Bilateral    cataract surgery with lens implant  . FINGER AMPUTATION Right    little finger  . I&D EXTREMITY  03/11/2012   Procedure: MINOR IRRIGATION AND DEBRIDEMENT EXTREMITY;  Surgeon: Cammie Sickle., MD;  Location: [place];  Service: Orthopedics;  Laterality: Right;  Right little finger  . KNEE ARTHROSCOPY     bilat   . REPLACEMENT TOTAL KNEE BILATERAL    . TONSILLECTOMY    . TOTAL KNEE REVISION Right 03/14/2015   Procedure: TIBIAL REVISION, OPEN REDUCTION INTERNAL FIXATION PROXIMAL TIBIA;  Surgeon: Dorna Leitz, MD;  Location: [place];  Service: Orthopedics;   Laterality: Right;  . TRIGGER FINGER RELEASE    . TUBAL LIGATION       Current Outpatient Medications  Medication Sig Dispense Refill  . Ascorbic Acid (VITAMIN C PO) Take 1 capsule by mouth daily.    Marland Kitchen aspirin EC 81 MG tablet Take 81 mg by mouth daily.    . beta carotene w/minerals (OCUVITE) tablet Take 1 tablet by mouth 2 (two) times daily.    Marland Kitchen CALCIUM-VITAMIN D PO Take 1 tablet by mouth daily.    . Cholecalciferol (VITAMIN D-3) 1000 units CAPS Take 1 capsule by mouth daily.    . citalopram (CELEXA) 20 MG tablet Take 20 mg by mouth daily.    . ferrous sulfate 325 (65 FE) MG tablet Take 325 mg by mouth 2 (two) times daily with a meal.     . folic acid (FOLVITE) 462 MCG tablet Take 800 mcg by mouth daily.    Marland Kitchen levothyroxine (SYNTHROID, LEVOTHROID) 125 MCG tablet Take 125 mcg by mouth daily.    Marland Kitchen losartan (COZAAR) 50 MG tablet Take 50 mg by mouth daily.    . metoprolol tartrate (LOPRESSOR) 25 MG tablet Take 1 tablet (25 mg total) by mouth 2 (two) times daily. 60 tablet 11  . mirabegron ER (MYRBETRIQ) 50 MG TB24 tablet Take 50 mg by mouth daily.    . potassium chloride SA (K-DUR,KLOR-CON) 20 MEQ tablet Take 20 mEq by mouth daily.    . pravastatin (PRAVACHOL) 10 MG tablet Take 10 mg by mouth daily.    . traZODone (DESYREL) 50 MG tablet Take 50 mg by mouth at bedtime.     No current facility-administered medications for this visit.     Allergies:   Lisinopril and Phenergan [promethazine hcl]    Social History:  The patient  reports that she has never smoked. She has never used smokeless tobacco. She reports that she drinks about 14.0 standard drinks of alcohol per week. She reports that she does not use drugs.   Family History:  The patient's family history includes Bladder Cancer in her father.    ROS:  Please see the history of present illness.       Physical Exam: Blood pressure (!) 144/62, pulse 71, height 5\' 4"  (1.626 m), weight 263 lb 12.8 oz (119.7 kg), SpO2 96 %.  GEN:   Elderly female ,   HEENT: Normal NECK: No JVD; No carotid bruits LYMPHATICS: No lymphadenopathy CARDIAC: RRR   RESPIRATORY:  Clear to auscultation without rales, wheezing or rhonchi  ABDOMEN: Soft, non-tender, non-distended MUSCULOSKELETAL:  No edema; No deformity  SKIN: Warm  and dry NEUROLOGIC:  Alert and oriented x 3   EKG:    June 10, 2018: Normal sinus rhythm at 71.  Normal EKG.   Recent Labs: No results found for requested labs within last 8760 hours.    Lipid Panel    Component Value Date/Time   CHOL 191 05/21/2017 1128   TRIG 192 (H) 05/21/2017 1128   HDL 48 05/21/2017 1128   CHOLHDL 4.0 05/21/2017 1128   CHOLHDL 2.8 06/16/2014 0429   VLDL 12 06/16/2014 0429   LDLCALC 105 (H) 05/21/2017 1128      Wt Readings from Last 3 Encounters:  06/10/18 263 lb 12.8 oz (119.7 kg)  05/21/17 263 lb 6.4 oz (119.5 kg)  04/24/17 263 lb (119.3 kg)      Other studies Reviewed: Additional studies/ records that were reviewed today include: . Review of the above records demonstrates:    ASSESSMENT AND PLAN:  1. Hypertension -    she has Stopped her Lasix.  We will also have her stop her potassium.  Blood pressure is fairly well controlled.   2. Possible TIAs -   3. Hypothyroidism -    4. Hyperlipidemia -     Managed by her primary md   5.  DOE, cough / chronic diastolic CHF-seems to be well controlled.  She stopped her Lasix because of having to go the bathroom so much.  We also advised her to stop her potassium. [place] back if she develops shortness of breath.  Return in 1 year     Current medicines are reviewed at length with the patient today.  The patient does not have concerns regarding medicines.  The following changes have been made:  no change     Signed, [name], MD  06/10/2018 4:29 PM    [facility], [place], [place]  [address] Phone: [phone]; Fax: [phone]

## 2018-07-05 ENCOUNTER — Other Ambulatory Visit: Payer: Self-pay | Admitting: Cardiovascular Disease

## 2018-07-07 ENCOUNTER — Other Ambulatory Visit: Payer: Self-pay | Admitting: Orthopedic Surgery

## 2018-07-07 DIAGNOSIS — M25511 Pain in right shoulder: Principal | ICD-10-CM

## 2018-07-07 DIAGNOSIS — G8929 Other chronic pain: Secondary | ICD-10-CM

## 2018-07-09 NOTE — Telephone Encounter (Signed)
Should the patient still be taking this? It was put on her chart at her last visit but on that same note it is documented as below 5.  DOE, cough / chronic diastolic CHF-seems to be well controlled.  She stopped her Lasix because of having to go the bathroom so much.  We also advised her to stop her potassium. [place] back if she develops shortness of breath.  Please advise. Thanks, MI

## 2018-07-15 ENCOUNTER — Ambulatory Visit
Admission: RE | Admit: 2018-07-15 | Discharge: 2018-07-15 | Disposition: A | Discharge status: Home / Self Care | Payer: Medicare Other | Source: Ambulatory Visit | Attending: Orthopedic Surgery | Admitting: Orthopedic Surgery

## 2018-07-15 DIAGNOSIS — M25511 Pain in right shoulder: Principal | ICD-10-CM

## 2018-07-15 DIAGNOSIS — G8929 Other chronic pain: Secondary | ICD-10-CM

## 2018-07-16 ENCOUNTER — Other Ambulatory Visit: Payer: Self-pay | Admitting: Cardiovascular Disease

## 2018-07-16 MED ORDER — POTASSIUM CHLORIDE CRYS ER 20 MEQ PO TBCR: 20.0000 meq | EXTENDED_RELEASE_TABLET | Freq: Every day | ORAL | 3 refills | Status: DC

## 2018-08-06 ENCOUNTER — Telehealth: Payer: Self-pay | Admitting: Cardiovascular Disease

## 2018-08-06 ENCOUNTER — Other Ambulatory Visit: Payer: Self-pay | Admitting: Orthopedic Surgery

## 2018-08-06 NOTE — Telephone Encounter (Signed)
New Message      [facility] Pre-operative Risk Assessment    Request for surgical clearance:  1. What type of surgery is being performed? Right reverse total shoulder replacement    2. When is this surgery scheduled? TBD  3. What type of clearance is required (medical clearance vs. Pharmacy clearance to hold med vs. Both)? both  4. Are there any medications that need to be held prior to surgery and how long? Providers discretion if any medication needs to be held    5. Practice name and name of physician performing surgery? [facility] Dr. Tamera Punt    6. What is your office phone number [phone]   7.   What is your office fax number [phone]  8.   Anesthesia type (None, local, MAC, general) ? choice   Samantha Stone 08/06/2018, 2:42 PM  _________________________________________________________________   (provider comments below)

## 2018-08-08 NOTE — Telephone Encounter (Signed)
   Primary Cardiologist: [name], MD  Chart reviewed as part of pre-operative protocol coverage. Patient was contacted 08/08/2018 in reference to pre-operative risk assessment for pending surgery as outlined below.  Samantha Stone was last seen 05/2018 by Dr. Acie Fredrickson - h/o chronic diastolic CHF, possible TIAs, HTN, hypothyroidism, depression. Doing well at last OV.   We were asked to decide if any meds need to be held for shoulder replacement. She is on ASA 81mg  daily I suspect for hx of TIA. Will reach out to Dr. Acie Fredrickson for input if OK to hold ASA 5-7 days then patient will need call. Dr. Acie Fredrickson - Please route response to P CV DIV PREOP (the pre-op pool). Thank you.   Charlie Pitter, PA-C 08/08/2018, 2:34 PM

## 2018-08-11 NOTE — Telephone Encounter (Signed)
   Primary Cardiologist: [name], MD  [facility] for patient to call back to assess symptoms.  Charlie Pitter, PA-C 08/11/2018, 2:57 PM

## 2018-08-11 NOTE — Telephone Encounter (Signed)
OK to hold ASA for 5-7 days prior to surgery  She is at low risk

## 2018-08-26 NOTE — Pre-Procedure Instructions (Signed)
Samantha Stone  08/26/2018      [id number] [address], [place] [address] Phone: [phone] Fax: [phone]    Your procedure is scheduled on Feb. 6  Report to [facility] Admitting at 5:30 A.M.  Call this number if you have problems the morning of surgery:  [phone]   Remember:  Do not eat or drink after midnight.      Take these medicines the morning of surgery with A SIP OF WATER :             Citalopram (celexa)             Levothyroxine (synthroid)             Metoprolol (lopressor)             mirabegron                         7 days prior to surgery STOP taking any Aspirin (unless otherwise instructed by your surgeon), Aleve, Naproxen, Ibuprofen, Motrin, Advil, Goody's, BC's, all herbal medications, fish oil, and all vitamins, celebrex.    Do not wear jewelry, make-up or nail polish.  Do not wear lotions, powders, or perfumes, or deodorant.  Do not shave 48 hours prior to surgery.  Men may shave face and neck.  Do not bring valuables to the hospital.  [facility] is not responsible for any belongings or valuables.  Contacts, dentures or bridgework may not be worn into surgery.  Leave your suitcase in the car.  After surgery it may be brought to your room.  For patients admitted to the hospital, discharge time will be determined by your treatment team.  Patients discharged the day of surgery will not be allowed to drive home.    Special instructions:  [place]- Preparing For Surgery  Before surgery, you can play an important role. Because skin is not sterile, your skin needs to be as free of germs as possible. You can reduce the number of germs on your skin by washing with CHG (chlorahexidine gluconate) Soap before surgery.  CHG is an antiseptic cleaner which kills germs and bonds with the skin to continue killing germs even after washing.    Oral Hygiene is also important to  reduce your risk of infection.  Remember - BRUSH YOUR TEETH THE MORNING OF SURGERY WITH YOUR REGULAR TOOTHPASTE  Please do not use if you have an allergy to CHG or antibacterial soaps. If your skin becomes reddened/irritated stop using the CHG.  Do not shave (including legs and underarms) for at least 48 hours prior to first CHG shower. It is OK to shave your face.  Please follow these instructions carefully.   1. Shower the NIGHT BEFORE SURGERY and the MORNING OF SURGERY with CHG.   2. If you chose to wash your hair, wash your hair first as usual with your normal shampoo.  3. After you shampoo, rinse your hair and body thoroughly to remove the shampoo.  4. Use CHG as you would any other liquid soap. You can apply CHG directly to the skin and wash gently with a scrungie or a clean washcloth.   5. Apply the CHG Soap to your body ONLY FROM THE NECK DOWN.  Do not use on open wounds or open sores. Avoid contact with your eyes, ears, mouth and genitals (private parts). Wash Face and genitals (private parts)  with your normal soap.  6. Wash thoroughly, paying special attention to the area where your surgery will be performed.  7. Thoroughly rinse your body with warm water from the neck down.  8. DO NOT shower/wash with your normal soap after using and rinsing off the CHG Soap.  9. Pat yourself dry with a CLEAN TOWEL.  10. Wear CLEAN PAJAMAS to bed the night before surgery, wear comfortable clothes the morning of surgery  11. Place CLEAN SHEETS on your bed the night of your first shower and DO NOT SLEEP WITH PETS.    Day of Surgery:  Do not apply any deodorants/lotions.  Please wear clean clothes to the hospital/surgery center.   Remember to brush your teeth WITH YOUR REGULAR TOOTHPASTE.    Please read over the following fact sheets that you were given. Coughing and Deep Breathing, MRSA Information and Surgical Site Infection Prevention

## 2018-08-27 ENCOUNTER — Other Ambulatory Visit: Payer: Self-pay

## 2018-08-27 ENCOUNTER — Encounter (HOSPITAL_COMMUNITY): Payer: Self-pay

## 2018-08-27 ENCOUNTER — Encounter (HOSPITAL_COMMUNITY)
Admission: RE | Admit: 2018-08-27 | Discharge: 2018-08-27 | Disposition: A | Discharge status: Home / Self Care | Payer: Medicare Other | Source: Ambulatory Visit | Attending: Orthopedic Surgery | Admitting: Orthopedic Surgery

## 2018-08-27 ENCOUNTER — Ambulatory Visit (HOSPITAL_COMMUNITY)
Admission: RE | Admit: 2018-08-27 | Discharge: 2018-08-27 | Disposition: A | Discharge status: Home / Self Care | Payer: Medicare Other | Source: Ambulatory Visit | Attending: Orthopedic Surgery | Admitting: Orthopedic Surgery

## 2018-08-27 DIAGNOSIS — Z01811 Encounter for preprocedural respiratory examination: Secondary | ICD-10-CM | POA: Diagnosis not present

## 2018-08-27 HISTORY — DX: Dyspnea, unspecified: R06.00

## 2018-08-27 LAB — CBC WITH DIFFERENTIAL/PLATELET
Abs Immature Granulocytes: 0.02 10*3/uL (ref 0.00–0.07)
Basophils Absolute: 0 10*3/uL (ref 0.0–0.1)
Basophils Relative: 1 %
Eosinophils Absolute: 0.3 10*3/uL (ref 0.0–0.5)
Eosinophils Relative: 6 %
HCT: 41.4 % (ref 36.0–46.0)
Hemoglobin: 13.3 g/dL (ref 12.0–15.0)
Immature Granulocytes: 0 %
Lymphocytes Relative: 23 %
Lymphs Abs: 1.2 10*3/uL (ref 0.7–4.0)
MCH: 29 pg (ref 26.0–34.0)
MCHC: 32.1 g/dL (ref 30.0–36.0)
MCV: 90.2 fL (ref 80.0–100.0)
Monocytes Absolute: 0.8 10*3/uL (ref 0.1–1.0)
Monocytes Relative: 15 %
Neutro Abs: 2.9 10*3/uL (ref 1.7–7.7)
Neutrophils Relative %: 55 %
Platelets: 282 10*3/uL (ref 150–400)
RBC: 4.59 MIL/uL (ref 3.87–5.11)
RDW: 13.9 % (ref 11.5–15.5)
WBC: 5.2 10*3/uL (ref 4.0–10.5)
nRBC: 0 % (ref 0.0–0.2)

## 2018-08-27 LAB — TYPE AND SCREEN
ABO/RH(D): O POS
Antibody Screen: NEGATIVE

## 2018-08-27 LAB — COMPREHENSIVE METABOLIC PANEL
ALT: 18 U/L (ref 0–44)
AST: 25 U/L (ref 15–41)
Albumin: 3.6 g/dL (ref 3.5–5.0)
Alkaline Phosphatase: 65 U/L (ref 38–126)
Anion gap: 5 (ref 5–15)
BUN: 16 mg/dL (ref 8–23)
CO2: 30 mmol/L (ref 22–32)
Calcium: 9.3 mg/dL (ref 8.9–10.3)
Chloride: 103 mmol/L (ref 98–111)
Creatinine, Ser: 0.85 mg/dL (ref 0.44–1.00)
GFR calc Af Amer: >60 mL/min (ref >60)
GFR calc non Af Amer: >60 mL/min (ref >60)
Glucose, Bld: 104 mg/dL — ABNORMAL HIGH (ref 70–99)
Potassium: 4.4 mmol/L (ref 3.5–5.1)
Sodium: 138 mmol/L (ref 135–145)
Total Bilirubin: 0.9 mg/dL (ref 0.3–1.2)
Total Protein: 6.3 g/dL — ABNORMAL LOW (ref 6.5–8.1)

## 2018-08-27 LAB — URINALYSIS, ROUTINE W REFLEX MICROSCOPIC
Bilirubin Urine: NEGATIVE
Glucose, UA: NEGATIVE mg/dL
Hgb urine dipstick: NEGATIVE
Ketones, ur: NEGATIVE mg/dL
Leukocytes, UA: NEGATIVE
Nitrite: NEGATIVE
Protein, ur: NEGATIVE mg/dL
Specific Gravity, Urine: 1.018 (ref 1.005–1.030)
pH: 5 (ref 5.0–8.0)

## 2018-08-27 LAB — PROTIME-INR
INR: 1.04
Prothrombin Time: 13.5 seconds (ref 11.4–15.2)

## 2018-08-27 LAB — SURGICAL PCR SCREEN
MRSA, PCR: NEGATIVE
Staphylococcus aureus: NEGATIVE

## 2018-08-27 LAB — APTT: aPTT: 35 seconds (ref 24–36)

## 2018-08-27 NOTE — Progress Notes (Signed)
PCP - [name] Cardiologist - Dr. Acie Fredrickson  Chest x-ray - 08-27-18 EKG - 06-10-18  Aspirin Instructions: Per Dr. Elmarie Shiley note, ok to hold ASA for 5-7 days prior to surgery.  Anesthesia review: yes, heart history  Patient stated she has ongoing, sporadic cough.  Pt stated she has seen PCP and Cardiologist to follow up with cough.  Cough is not a new symptom.  Patient denies shortness of breath, fever, and chest pain at PAT appointment   Patient verbalized understanding of instructions that were given to them at the PAT appointment. Patient was also instructed that they will need to review over the PAT instructions again at home before surgery.

## 2018-08-28 NOTE — Anesthesia Preprocedure Evaluation (Addendum)
Anesthesia Evaluation  Patient identified by MRN, date of birth, ID band Patient awake    Reviewed: Allergy & Precautions, NPO status , Patient's Chart, lab work & pertinent test results  History of Anesthesia Complications Negative for: history of anesthetic complications  Airway Mallampati: III  TM Distance: >3 FB Neck ROM: Full    Dental no notable dental hx.    Pulmonary  Chronic cough   Pulmonary exam normal breath sounds clear to auscultation       Cardiovascular hypertension, Pt. on medications and Pt. on home beta blockers +CHF  Normal cardiovascular exam Rhythm:Regular Rate:Normal     Neuro/Psych PSYCHIATRIC DISORDERS Depression negative neurological ROS     GI/Hepatic negative GI ROS, Neg liver ROS,   Endo/Other  Hypothyroidism Morbid obesity  Renal/GU negative Renal ROS  negative genitourinary   Musculoskeletal  (+) Arthritis ,   Abdominal   Peds  Hematology negative hematology ROS (+)   Anesthesia Other Findings From PAT Note: "85 yo female never smoker. Pertinent hx includes HTN, HFpEF, DOE, Hypothyroid, Possible TIA, Cough variant asthma.  Pt follows with Dr. Acie Fredrickson for HTN and HFpEF. Per last OV note 06/10/2018 her HFpEF was well controlled. She was cleared for surgery as low risk per telephone encounter 08/11/2018."  Reproductive/Obstetrics                           Anesthesia Physical Anesthesia Plan  ASA: III  Anesthesia Plan: General   Post-op Pain Management: GA combined w/ Regional for post-op pain   Induction:   PONV Risk Score and Plan: 3 and Ondansetron, Dexamethasone and Treatment may vary due to age or medical condition  Airway Management Planned: Oral ETT  Additional Equipment: None  Intra-op Plan:   Post-operative Plan: Extubation in OR  Informed Consent: I have reviewed the patients History and Physical, chart, labs and discussed the procedure  including the risks, benefits and alternatives for the proposed anesthesia with the patient or authorized representative who has indicated his/her understanding and acceptance.     Dental advisory given  Plan Discussed with:   Anesthesia Plan Comments: ( )      Anesthesia Quick Evaluation

## 2018-08-28 NOTE — Progress Notes (Signed)
Anesthesia Chart Review:  Case:  [id number] Date/Time:  09/04/18 0715   Procedure:  REVERSE SHOULDER ARTHROPLASTY (Right )   Anesthesia type:  Choice   Pre-op diagnosis:  RIGHT SHOULDER ROTATOR CUFF TEAR ARTHROPATHY   Location:  [facility] OR ROOM 07 / [facility] [place] [facility]   Surgeon:  Tania Ade, MD      DISCUSSION: 85 yo female never smoker. Pertinent hx includes HTN, HFpEF, DOE, Hypothyroid, Possible TIA, Cough variant asthma.  Pt follows with Dr. Acie Fredrickson for HTN and HFpEF. Per last OV note 06/10/2018 her HFpEF was well controlled. She was cleared for surgery as low risk per telephone encounter 08/11/2018.   Anticipate she can proceed as planned barring acute status change.   VS: BP (!) 162/78   Pulse 63   Temp 36.5 C   Resp 20   Ht 5\' 4"  (1.626 m)   Wt 120.3 kg   SpO2 96%   BMI 45.52 kg/m   PROVIDERS: [name], MD is PCP  [name], MD is Cardiologist  Christinia Gully, MD is Pulmonologist  LABS: Labs reviewed: Acceptable for surgery. (all labs ordered are listed, but only abnormal results are displayed)  Labs Reviewed  COMPREHENSIVE METABOLIC PANEL - Abnormal; Notable for the following components:      Result Value   Glucose, Bld 104 (*)    Total Protein 6.3 (*)    All other components within normal limits  SURGICAL PCR SCREEN  APTT  CBC WITH DIFFERENTIAL/PLATELET  PROTIME-INR  URINALYSIS, ROUTINE W REFLEX MICROSCOPIC  TYPE AND SCREEN     IMAGES: CHEST - 2 VIEW 08/27/2018  COMPARISON:  11/27/2016  FINDINGS: Cardiomegaly. No confluent opacities, effusions or edema. No acute bony abnormality. Degenerative changes in the shoulders and thoracic spine.  IMPRESSION: Cardiomegaly.  No active disease.  EKG: 06/10/2018: NSR. Rate 71.  CV: TTE 06/06/2016: Study Conclusions  - Left ventricle: The cavity size was normal. Wall thickness was   increased in a pattern of mild LVH. There was moderate focal   basal hypertrophy of the septum. Systolic function was  normal.   The estimated ejection fraction was in the range of 60% to 65%.   Doppler parameters are consistent with abnormal left ventricular   relaxation (grade 1 diastolic dysfunction). - Mitral valve: Calcified annulus. Mildly thickened leaflets .   There was mild regurgitation. - Left atrium: The atrium was moderately dilated. - Right atrium: The atrium was mildly dilated. - Pulmonary arteries: PA peak pressure: 38 mm Hg (S). - Impressions: There is moderate hypertrophy of the basilar septum   with a narrow LVOT but no significant outflow tract gradient or   SAM to suggest HOCM.  Impressions:  - There is moderate hypertrophy of the basilar septum with a narrow   LVOT but no significant outflow tract gradient or SAM to suggest   HOCM.    Past Medical History:  Diagnosis Date  . Arthritis   . Cancer ([place])    basal cell skin cancer  . Concussion   . Depression   . Dyspnea    upon exertion  . Headache   . Hypertension   . Hypothyroidism   . Stress incontinence     Past Surgical History:  Procedure Laterality Date  . ABDOMINAL HYSTERECTOMY    . APPENDECTOMY    . CARPAL TUNNEL RELEASE     rt x2  . COLONOSCOPY    . EYE SURGERY Bilateral    cataract surgery with lens implant  . FINGER AMPUTATION Right  little finger  . I&D EXTREMITY  03/11/2012   Procedure: MINOR IRRIGATION AND DEBRIDEMENT EXTREMITY;  Surgeon: Cammie Sickle., MD;  Location: [facility];  Service: Orthopedics;  Laterality: Right;  Right little finger  . KNEE ARTHROSCOPY     bilat   . REPLACEMENT TOTAL KNEE BILATERAL    . TONSILLECTOMY    . TOTAL KNEE REVISION Right 03/14/2015   Procedure: TIBIAL REVISION, OPEN REDUCTION INTERNAL FIXATION PROXIMAL TIBIA;  Surgeon: Dorna Leitz, MD;  Location: [facility];  Service: Orthopedics;  Laterality: Right;  . TRIGGER FINGER RELEASE    . TUBAL LIGATION      MEDICATIONS: . aspirin EC 81 MG tablet  . Calcium Carb-Cholecalciferol (CALCIUM  600/VITAMIN D3 PO)  . celecoxib (CELEBREX) 200 MG capsule  . Cholecalciferol (VITAMIN D-3) 1000 units CAPS  . citalopram (CELEXA) 20 MG tablet  . ferrous sulfate 325 (65 FE) MG tablet  . folic acid (FOLVITE) 276 MCG tablet  . levothyroxine (SYNTHROID, LEVOTHROID) 137 MCG tablet  . losartan (COZAAR) 100 MG tablet  . metoprolol tartrate (LOPRESSOR) 25 MG tablet  . mirabegron ER (MYRBETRIQ) 25 MG TB24 tablet  . Multiple Vitamins-Minerals (PRESERVISION AREDS 2 PO)  . naproxen sodium (ALEVE) 220 MG tablet  . potassium chloride SA (K-DUR,KLOR-CON) 20 MEQ tablet  . pravastatin (PRAVACHOL) 10 MG tablet  . traZODone (DESYREL) 50 MG tablet  . vitamin C (ASCORBIC ACID) 500 MG tablet   No current facility-administered medications for this encounter.    Wynonia Musty [facility] Short Stay Center/Anesthesiology Phone [phone] 08/28/2018 3:00 PM

## 2018-09-01 NOTE — Progress Notes (Addendum)
Pt. Called concerned that no one had called in her prescription for mupirocin . Informed pt.that we could treat DOS if necessary.  Upon checking lab work   Pt.'s PCR swab was neg.  Left message for pt. On voice mail.

## 2018-09-03 MED ORDER — DEXTROSE 5 % IV SOLN
3.0000 g | INTRAVENOUS | Status: AC
  Administered 2018-09-04: 3 g via INTRAVENOUS
  Filled 2018-09-03: qty 3

## 2018-09-04 ENCOUNTER — Inpatient Hospital Stay (HOSPITAL_COMMUNITY): Payer: Medicare Other | Admitting: Physician Assistant

## 2018-09-04 ENCOUNTER — Inpatient Hospital Stay (HOSPITAL_COMMUNITY): Payer: Medicare Other

## 2018-09-04 ENCOUNTER — Encounter (HOSPITAL_COMMUNITY)
Admission: AD | Disposition: A | Discharge status: Skilled Nursing Facility | Payer: Self-pay | Source: Skilled Nursing Facility | Attending: Orthopedic Surgery

## 2018-09-04 ENCOUNTER — Other Ambulatory Visit: Payer: Self-pay

## 2018-09-04 ENCOUNTER — Inpatient Hospital Stay (HOSPITAL_COMMUNITY)
Admission: AD | Admit: 2018-09-04 | Discharge: 2018-09-05 | DRG: 483 | Disposition: A | Discharge status: Skilled Nursing Facility | Payer: Medicare Other | Source: Skilled Nursing Facility | Attending: Orthopedic Surgery | Admitting: Orthopedic Surgery

## 2018-09-04 ENCOUNTER — Inpatient Hospital Stay (HOSPITAL_COMMUNITY): Payer: Medicare Other | Admitting: Anesthesiology

## 2018-09-04 DIAGNOSIS — M19011 Primary osteoarthritis, right shoulder: Principal | ICD-10-CM | POA: Diagnosis present

## 2018-09-04 DIAGNOSIS — E039 Hypothyroidism, unspecified: Secondary | ICD-10-CM | POA: Diagnosis present

## 2018-09-04 DIAGNOSIS — Z96611 Presence of right artificial shoulder joint: Secondary | ICD-10-CM

## 2018-09-04 DIAGNOSIS — Z85828 Personal history of other malignant neoplasm of skin: Secondary | ICD-10-CM | POA: Diagnosis not present

## 2018-09-04 DIAGNOSIS — Z89021 Acquired absence of right finger(s): Secondary | ICD-10-CM | POA: Diagnosis not present

## 2018-09-04 DIAGNOSIS — M75121 Complete rotator cuff tear or rupture of right shoulder, not specified as traumatic: Secondary | ICD-10-CM | POA: Diagnosis present

## 2018-09-04 DIAGNOSIS — I10 Essential (primary) hypertension: Secondary | ICD-10-CM | POA: Diagnosis present

## 2018-09-04 DIAGNOSIS — Z79890 Hormone replacement therapy (postmenopausal): Secondary | ICD-10-CM | POA: Diagnosis not present

## 2018-09-04 DIAGNOSIS — Z7982 Long term (current) use of aspirin: Secondary | ICD-10-CM

## 2018-09-04 DIAGNOSIS — Z90710 Acquired absence of both cervix and uterus: Secondary | ICD-10-CM

## 2018-09-04 DIAGNOSIS — Z79899 Other long term (current) drug therapy: Secondary | ICD-10-CM

## 2018-09-04 HISTORY — PX: REVERSE SHOULDER ARTHROPLASTY: SHX5054

## 2018-09-04 SURGERY — ARTHROPLASTY, SHOULDER, TOTAL, REVERSE
Anesthesia: General | Site: Shoulder | Laterality: Right

## 2018-09-04 MED ORDER — BISACODYL 5 MG PO TBEC: 5.0000 mg | DELAYED_RELEASE_TABLET | Freq: Every day | ORAL | Status: DC | PRN

## 2018-09-04 MED ORDER — HYDROCODONE-ACETAMINOPHEN 7.5-325 MG PO TABS: 1.0000 | ORAL_TABLET | ORAL | Status: DC | PRN

## 2018-09-04 MED ORDER — FENTANYL CITRATE (PF) 250 MCG/5ML IJ SOLN
INTRAMUSCULAR | Status: AC
  Filled 2018-09-04: qty 5

## 2018-09-04 MED ORDER — ACETAMINOPHEN 325 MG PO TABS: 325.0000 mg | ORAL_TABLET | Freq: Four times a day (QID) | ORAL | Status: DC | PRN

## 2018-09-04 MED ORDER — METOCLOPRAMIDE HCL 5 MG PO TABS: 5.0000 mg | ORAL_TABLET | Freq: Three times a day (TID) | ORAL | Status: DC | PRN

## 2018-09-04 MED ORDER — LACTATED RINGERS IV SOLN
INTRAVENOUS | Status: DC | PRN
  Administered 2018-09-04 (×2): via INTRAVENOUS

## 2018-09-04 MED ORDER — ONDANSETRON HCL 4 MG/2ML IJ SOLN: 4.0000 mg | Freq: Once | INTRAMUSCULAR | Status: DC | PRN

## 2018-09-04 MED ORDER — BUPIVACAINE HCL (PF) 0.5 % IJ SOLN
INTRAMUSCULAR | Status: DC | PRN
  Administered 2018-09-04: 15 mL via PERINEURAL

## 2018-09-04 MED ORDER — CHLORHEXIDINE GLUCONATE 4 % EX LIQD: 60.0000 mL | Freq: Once | CUTANEOUS | Status: DC

## 2018-09-04 MED ORDER — CELECOXIB 200 MG PO CAPS
200.0000 mg | ORAL_CAPSULE | Freq: Every day | ORAL | Status: DC
  Administered 2018-09-05: 200 mg via ORAL
  Filled 2018-09-04: qty 1

## 2018-09-04 MED ORDER — PROPOFOL 10 MG/ML IV BOLUS
INTRAVENOUS | Status: AC
  Filled 2018-09-04: qty 20

## 2018-09-04 MED ORDER — GLYCOPYRROLATE PF 0.2 MG/ML IJ SOSY
PREFILLED_SYRINGE | INTRAMUSCULAR | Status: AC
  Filled 2018-09-04: qty 1

## 2018-09-04 MED ORDER — TRAZODONE HCL 50 MG PO TABS
50.0000 mg | ORAL_TABLET | Freq: Every day | ORAL | Status: DC
  Administered 2018-09-04: 50 mg via ORAL
  Filled 2018-09-04: qty 1

## 2018-09-04 MED ORDER — ASPIRIN EC 325 MG PO TBEC
325.0000 mg | DELAYED_RELEASE_TABLET | Freq: Two times a day (BID) | ORAL | Status: DC
  Administered 2018-09-04 – 2018-09-05 (×3): 325 mg via ORAL
  Filled 2018-09-04 (×3): qty 1

## 2018-09-04 MED ORDER — MORPHINE SULFATE (PF) 2 MG/ML IV SOLN: 0.5000 mg | INTRAVENOUS | Status: DC | PRN

## 2018-09-04 MED ORDER — EPHEDRINE SULFATE 50 MG/ML IJ SOLN
INTRAMUSCULAR | Status: DC | PRN
  Administered 2018-09-04: 10 mg via INTRAVENOUS
  Administered 2018-09-04: 5 mg via INTRAVENOUS

## 2018-09-04 MED ORDER — TRANEXAMIC ACID-NACL 1000-0.7 MG/100ML-% IV SOLN
INTRAVENOUS | Status: AC
  Filled 2018-09-04: qty 100

## 2018-09-04 MED ORDER — GLYCOPYRROLATE PF 0.2 MG/ML IJ SOSY
PREFILLED_SYRINGE | INTRAMUSCULAR | Status: DC | PRN
  Administered 2018-09-04: .2 mg via INTRAVENOUS

## 2018-09-04 MED ORDER — FERROUS SULFATE 325 (65 FE) MG PO TABS
325.0000 mg | ORAL_TABLET | Freq: Every day | ORAL | Status: DC
  Administered 2018-09-05: 325 mg via ORAL
  Filled 2018-09-04: qty 1

## 2018-09-04 MED ORDER — DIPHENHYDRAMINE HCL 12.5 MG/5ML PO ELIX: 12.5000 mg | ORAL_SOLUTION | ORAL | Status: DC | PRN

## 2018-09-04 MED ORDER — METOCLOPRAMIDE HCL 5 MG/ML IJ SOLN: 5.0000 mg | Freq: Three times a day (TID) | INTRAMUSCULAR | Status: DC | PRN

## 2018-09-04 MED ORDER — HYDROCODONE-ACETAMINOPHEN 5-325 MG PO TABS: 1.0000 | ORAL_TABLET | ORAL | Status: DC | PRN

## 2018-09-04 MED ORDER — ONDANSETRON HCL 4 MG/2ML IJ SOLN
INTRAMUSCULAR | Status: DC | PRN
  Administered 2018-09-04: 4 mg via INTRAVENOUS

## 2018-09-04 MED ORDER — ONDANSETRON HCL 4 MG/2ML IJ SOLN: 4.0000 mg | Freq: Four times a day (QID) | INTRAMUSCULAR | Status: DC | PRN

## 2018-09-04 MED ORDER — PROPOFOL 10 MG/ML IV BOLUS
INTRAVENOUS | Status: DC | PRN
  Administered 2018-09-04: 150 mg via INTRAVENOUS

## 2018-09-04 MED ORDER — FENTANYL CITRATE (PF) 100 MCG/2ML IJ SOLN: 25.0000 ug | INTRAMUSCULAR | Status: DC | PRN

## 2018-09-04 MED ORDER — OXYCODONE HCL 5 MG/5ML PO SOLN: 5.0000 mg | Freq: Once | ORAL | Status: DC | PRN

## 2018-09-04 MED ORDER — TRANEXAMIC ACID-NACL 1000-0.7 MG/100ML-% IV SOLN
1000.0000 mg | INTRAVENOUS | Status: AC
  Administered 2018-09-04: 1000 mg via INTRAVENOUS

## 2018-09-04 MED ORDER — LEVOTHYROXINE SODIUM 25 MCG PO TABS
137.0000 ug | ORAL_TABLET | Freq: Every day | ORAL | Status: DC
  Administered 2018-09-05: 137 ug via ORAL
  Filled 2018-09-04: qty 1

## 2018-09-04 MED ORDER — POLYETHYLENE GLYCOL 3350 17 G PO PACK: 17.0000 g | PACK | Freq: Every day | ORAL | Status: DC | PRN

## 2018-09-04 MED ORDER — LOSARTAN POTASSIUM 50 MG PO TABS
100.0000 mg | ORAL_TABLET | Freq: Every day | ORAL | Status: DC
  Administered 2018-09-04 – 2018-09-05 (×2): 100 mg via ORAL
  Filled 2018-09-04 (×2): qty 2

## 2018-09-04 MED ORDER — OXYCODONE-ACETAMINOPHEN 5-325 MG PO TABS: 1.0000 | ORAL_TABLET | ORAL | 0 refills | Status: DC | PRN

## 2018-09-04 MED ORDER — METHOCARBAMOL 500 MG PO TABS: 500.0000 mg | ORAL_TABLET | Freq: Two times a day (BID) | ORAL | 0 refills | Status: DC

## 2018-09-04 MED ORDER — MENTHOL 3 MG MT LOZG: 1.0000 | LOZENGE | OROMUCOSAL | Status: DC | PRN

## 2018-09-04 MED ORDER — DIPHENHYDRAMINE HCL 50 MG/ML IJ SOLN
INTRAMUSCULAR | Status: DC | PRN
  Administered 2018-09-04: 12.5 mg via INTRAVENOUS

## 2018-09-04 MED ORDER — DOCUSATE SODIUM 100 MG PO CAPS
100.0000 mg | ORAL_CAPSULE | Freq: Two times a day (BID) | ORAL | Status: DC
  Administered 2018-09-04 – 2018-09-05 (×3): 100 mg via ORAL
  Filled 2018-09-04 (×3): qty 1

## 2018-09-04 MED ORDER — PHENOL 1.4 % MT LIQD: 1.0000 | OROMUCOSAL | Status: DC | PRN

## 2018-09-04 MED ORDER — BUPIVACAINE LIPOSOME 1.3 % IJ SUSP
INTRAMUSCULAR | Status: DC | PRN
  Administered 2018-09-04: 10 mL

## 2018-09-04 MED ORDER — ONDANSETRON HCL 4 MG PO TABS: 4.0000 mg | ORAL_TABLET | Freq: Four times a day (QID) | ORAL | Status: DC | PRN

## 2018-09-04 MED ORDER — FLEET ENEMA 7-19 GM/118ML RE ENEM: 1.0000 | ENEMA | Freq: Once | RECTAL | Status: DC | PRN

## 2018-09-04 MED ORDER — SODIUM CHLORIDE 0.9 % IR SOLN
Status: DC | PRN
  Administered 2018-09-04: 3000 mL

## 2018-09-04 MED ORDER — MIRABEGRON ER 25 MG PO TB24
25.0000 mg | ORAL_TABLET | Freq: Every day | ORAL | Status: DC
  Administered 2018-09-05: 25 mg via ORAL
  Filled 2018-09-04: qty 1

## 2018-09-04 MED ORDER — FENTANYL CITRATE (PF) 250 MCG/5ML IJ SOLN
INTRAMUSCULAR | Status: DC | PRN
  Administered 2018-09-04: 100 ug via INTRAVENOUS
  Administered 2018-09-04: 50 ug via INTRAVENOUS

## 2018-09-04 MED ORDER — ROCURONIUM BROMIDE 10 MG/ML (PF) SYRINGE
PREFILLED_SYRINGE | INTRAVENOUS | Status: DC | PRN
  Administered 2018-09-04: 20 mg via INTRAVENOUS
  Administered 2018-09-04: 50 mg via INTRAVENOUS
  Administered 2018-09-04: 30 mg via INTRAVENOUS

## 2018-09-04 MED ORDER — SODIUM CHLORIDE 0.9 % IV SOLN
INTRAVENOUS | Status: DC | PRN
  Administered 2018-09-04: 25 ug/min via INTRAVENOUS

## 2018-09-04 MED ORDER — VITAMIN C 500 MG PO TABS
500.0000 mg | ORAL_TABLET | Freq: Every day | ORAL | Status: DC
  Administered 2018-09-04 – 2018-09-05 (×2): 500 mg via ORAL
  Filled 2018-09-04 (×2): qty 1

## 2018-09-04 MED ORDER — CITALOPRAM HYDROBROMIDE 20 MG PO TABS
20.0000 mg | ORAL_TABLET | Freq: Every day | ORAL | Status: DC
  Administered 2018-09-05: 20 mg via ORAL
  Filled 2018-09-04: qty 1

## 2018-09-04 MED ORDER — ALUM & MAG HYDROXIDE-SIMETH 200-200-20 MG/5ML PO SUSP: 30.0000 mL | ORAL | Status: DC | PRN

## 2018-09-04 MED ORDER — HYDROCODONE-ACETAMINOPHEN 5-325 MG PO TABS: 1.0000 | ORAL_TABLET | Freq: Four times a day (QID) | ORAL | 0 refills | Status: DC | PRN

## 2018-09-04 MED ORDER — OXYCODONE HCL 5 MG PO TABS: 5.0000 mg | ORAL_TABLET | Freq: Once | ORAL | Status: DC | PRN

## 2018-09-04 MED ORDER — 0.9 % SODIUM CHLORIDE (POUR BTL) OPTIME
TOPICAL | Status: DC | PRN
  Administered 2018-09-04: 1000 mL

## 2018-09-04 MED ORDER — POTASSIUM CHLORIDE IN NACL 20-0.45 MEQ/L-% IV SOLN
INTRAVENOUS | Status: DC
  Administered 2018-09-04 – 2018-09-05 (×2): via INTRAVENOUS
  Filled 2018-09-04 (×2): qty 1000

## 2018-09-04 MED ORDER — METOPROLOL TARTRATE 25 MG PO TABS
25.0000 mg | ORAL_TABLET | Freq: Every day | ORAL | Status: DC
  Administered 2018-09-05: 25 mg via ORAL
  Filled 2018-09-04: qty 1

## 2018-09-04 MED ORDER — SUGAMMADEX SODIUM 200 MG/2ML IV SOLN
INTRAVENOUS | Status: DC | PRN
  Administered 2018-09-04: 300 mg via INTRAVENOUS

## 2018-09-04 MED ORDER — DEXAMETHASONE SODIUM PHOSPHATE 10 MG/ML IJ SOLN
INTRAMUSCULAR | Status: DC | PRN
  Administered 2018-09-04: 10 mg via INTRAVENOUS

## 2018-09-04 SURGICAL SUPPLY — 75 items
AID PSTN UNV HD RSTRNT DISP (MISCELLANEOUS) ×1
BASEPLATE P2 COATD GLND 6.5X30 (Shoulder) ×1 IMPLANT
BIT DRILL 2.5 DIA 127 CALI (BIT) ×3 IMPLANT
BIT DRILL 4 DIA CALIBRATED (BIT) ×2 IMPLANT
BIT DRILL 5/64X5 DISP (BIT) IMPLANT
BLADE SAW SAG 73X25 THK (BLADE) ×2
BLADE SAW SGTL 73X25 THK (BLADE) ×1 IMPLANT
BLADE SURG 15 STRL LF DISP TIS (BLADE) IMPLANT
BLADE SURG 15 STRL SS (BLADE)
BOWL SMART MIX CTS (DISPOSABLE) IMPLANT
BSPLAT GLND 30 STRL LF SHLDR (Shoulder) ×1 IMPLANT
CHLORAPREP W/TINT 26ML (MISCELLANEOUS) ×3 IMPLANT
CLOSURE STERI-STRIP 1/2X4 (GAUZE/BANDAGES/DRESSINGS) ×1
CLOSURE WOUND 1/2 X4 (GAUZE/BANDAGES/DRESSINGS) ×1
CLSR STERI-STRIP ANTIMIC 1/2X4 (GAUZE/BANDAGES/DRESSINGS) ×2 IMPLANT
COVER SURGICAL LIGHT HANDLE (MISCELLANEOUS) ×3 IMPLANT
COVER WAND RF STERILE (DRAPES) ×3 IMPLANT
DRAPE INCISE IOBAN 66X45 STRL (DRAPES) ×5 IMPLANT
DRAPE ORTHO SPLIT 77X108 STRL (DRAPES) ×6
DRAPE SURG ORHT 6 SPLT 77X108 (DRAPES) ×2 IMPLANT
DRSG AQUACEL AG ADV 3.5X 6 (GAUZE/BANDAGES/DRESSINGS) ×3 IMPLANT
ELECT BLADE 4.0 EZ CLEAN MEGAD (MISCELLANEOUS) ×3
ELECT REM PT RETURN 9FT ADLT (ELECTROSURGICAL) ×3
ELECTRODE BLDE 4.0 EZ CLN MEGD (MISCELLANEOUS) ×1 IMPLANT
ELECTRODE REM PT RTRN 9FT ADLT (ELECTROSURGICAL) ×1 IMPLANT
GLOVE BIO SURGEON STRL SZ7 (GLOVE) ×3 IMPLANT
GLOVE BIO SURGEON STRL SZ7.5 (GLOVE) ×3 IMPLANT
GLOVE BIOGEL PI IND STRL 7.0 (GLOVE) ×1 IMPLANT
GLOVE BIOGEL PI IND STRL 8 (GLOVE) ×1 IMPLANT
GLOVE BIOGEL PI INDICATOR 7.0 (GLOVE) ×2
GLOVE BIOGEL PI INDICATOR 8 (GLOVE) ×2
GOWN STRL REUS W/ TWL LRG LVL3 (GOWN DISPOSABLE) ×3 IMPLANT
GOWN STRL REUS W/ TWL XL LVL3 (GOWN DISPOSABLE) ×1 IMPLANT
GOWN STRL REUS W/TWL LRG LVL3 (GOWN DISPOSABLE) ×9
GOWN STRL REUS W/TWL XL LVL3 (GOWN DISPOSABLE) ×3
HANDPIECE INTERPULSE COAX TIP (DISPOSABLE) ×3
HOOD PEEL AWAY FLYTE STAYCOOL (MISCELLANEOUS) ×8 IMPLANT
INSERT SMALL SOCKET 32MM NEU (Insert) ×2 IMPLANT
KIT BASIN OR (CUSTOM PROCEDURE TRAY) ×3 IMPLANT
KIT TURNOVER KIT B (KITS) ×3 IMPLANT
MANIFOLD NEPTUNE II (INSTRUMENTS) ×3 IMPLANT
NEEDLE MAYO TROCAR (NEEDLE) IMPLANT
NS IRRIG 1000ML POUR BTL (IV SOLUTION) ×3 IMPLANT
P2 COATDE GLNOID BSEPLT 6.5X30 (Shoulder) ×3 IMPLANT
PACK SHOULDER (CUSTOM PROCEDURE TRAY) ×3 IMPLANT
PAD ARMBOARD 7.5X6 YLW CONV (MISCELLANEOUS) ×3 IMPLANT
RESTRAINT HEAD UNIVERSAL NS (MISCELLANEOUS) ×3 IMPLANT
SCREW BONE LOCKING RSP 5.0X14 (Screw) ×3 IMPLANT
SCREW BONE LOCKING RSP 5.0X30 (Screw) ×3 IMPLANT
SCREW BONE RSP LOCK 5X14 (Screw) IMPLANT
SCREW BONE RSP LOCK 5X26 (Screw) IMPLANT
SCREW BONE RSP LOCK 5X30 (Screw) ×1 IMPLANT
SCREW BONE RSP LOCKING 5.0X26 (Screw) ×3 IMPLANT
SCREW RETAIN W/HEAD 4MM OFFSET (Shoulder) ×3 IMPLANT
SET HNDPC FAN SPRY TIP SCT (DISPOSABLE) ×1 IMPLANT
SLING ARM FOAM STRAP LRG (SOFTGOODS) ×3 IMPLANT
SLING ARM FOAM STRAP MED (SOFTGOODS) IMPLANT
SPONGE LAP 18X18 X RAY DECT (DISPOSABLE) ×3 IMPLANT
SPONGE LAP 4X18 RFD (DISPOSABLE) ×2 IMPLANT
STEM HUMERAL REV SHL 6X108 SM (Stem) ×2 IMPLANT
STRIP CLOSURE SKIN 1/2X4 (GAUZE/BANDAGES/DRESSINGS) ×2 IMPLANT
SUCTION FRAZIER HANDLE 10FR (MISCELLANEOUS) ×2
SUCTION TUBE FRAZIER 10FR DISP (MISCELLANEOUS) ×1 IMPLANT
SUPPORT WRAP ARM LG (MISCELLANEOUS) ×3 IMPLANT
SUT ETHIBOND NAB CT1 #1 30IN (SUTURE) ×3 IMPLANT
SUT FIBERWIRE #2 38 T-5 BLUE (SUTURE)
SUT MNCRL AB 4-0 PS2 18 (SUTURE) ×3 IMPLANT
SUT SILK 2 0 TIES 17X18 (SUTURE)
SUT SILK 2-0 18XBRD TIE BLK (SUTURE) IMPLANT
SUT VIC AB 2-0 CT1 27 (SUTURE) ×6
SUT VIC AB 2-0 CT1 TAPERPNT 27 (SUTURE) ×2 IMPLANT
SUTURE FIBERWR #2 38 T-5 BLUE (SUTURE) IMPLANT
TOWEL OR 17X26 10 PK STRL BLUE (TOWEL DISPOSABLE) ×3 IMPLANT
WATER STERILE IRR 1000ML POUR (IV SOLUTION) ×1 IMPLANT
YANKAUER SUCT BULB TIP NO VENT (SUCTIONS) IMPLANT

## 2018-09-04 NOTE — Discharge Instructions (Signed)

## 2018-09-04 NOTE — Anesthesia Procedure Notes (Signed)
Procedure Name: Intubation Date/Time: 09/04/2018 7:49 AM Performed by: Mariea Clonts, CRNA Pre-anesthesia Checklist: Patient identified, Emergency Drugs available, Suction available and Patient being monitored Patient Re-evaluated:Patient Re-evaluated prior to induction Oxygen Delivery Method: Circle System Utilized Preoxygenation: Pre-oxygenation with 100% oxygen Induction Type: IV induction Ventilation: Mask ventilation without difficulty Laryngoscope Size: Glidescope and 3 Grade View: Grade I Tube type: Oral Tube size: 7.5 mm Number of attempts: 1 Airway Equipment and Method: Stylet and Oral airway Placement Confirmation: ETT inserted through vocal cords under direct vision,  positive ETCO2 and breath sounds checked- equal and bilateral Tube secured with: Tape Dental Injury: Teeth and Oropharynx as per pre-operative assessment

## 2018-09-04 NOTE — Plan of Care (Signed)

## 2018-09-04 NOTE — Anesthesia Postprocedure Evaluation (Signed)
Anesthesia Post Note  Patient: Samantha Stone  Procedure(s) Performed: REVERSE SHOULDER ARTHROPLASTY (Right Shoulder)     Patient location during evaluation: PACU Anesthesia Type: General Level of consciousness: awake and alert Pain management: pain level controlled Vital Signs Assessment: post-procedure vital signs reviewed and stable Respiratory status: spontaneous breathing, nonlabored ventilation and respiratory function stable Cardiovascular status: blood pressure returned to baseline and stable Postop Assessment: no apparent nausea or vomiting Anesthetic complications: no    Last Vitals:  Vitals:   09/04/18 1047 09/04/18 1102  BP: (!) 136/58 (!) 141/58  Pulse: 65 65  Resp: 17 17  Temp:  36.4 C  SpO2: 98% 96%    Last Pain:  Vitals:   09/04/18 1102  PainSc: 0-No pain                 Lidia Collum

## 2018-09-04 NOTE — Op Note (Signed)
Procedure(s): REVERSE SHOULDER ARTHROPLASTY Procedure Note  VIVAN VANDERVEER female 85 y.o. 09/04/2018  Procedure(s) and Anesthesia Type:    RIGHT REVERSE SHOULDER ARTHROPLASTY - General   Indications:  85 y.o. female  With endstage right shoulder arthritis with irrepairable rotator cuff tear. Pain and dysfunction interfered with quality of life and nonoperative treatment with activity modification, NSAIDS and injections failed.     Surgeon: Isabella Stalling   Assistants: Joanell Rising, PA-C Randall Hiss was scrubbed present throughout the procedure and was essential for assistance with retraction exposure and closure.)  Anesthesia: General endotracheal anesthesia with preoperative interscalene block given by the attending anesthesiologist    Procedure Detail  REVERSE SHOULDER ARTHROPLASTY   Estimated Blood Loss:  200 mL         Drains: none  Blood Given: none          Specimens: none        Complications:  * No complications entered in OR log *         Disposition: PACU - hemodynamically stable.         Condition: stable      OPERATIVE FINDINGS:  A DJO Altivate pressfit reverse total shoulder arthroplasty was placed with a  size 6 stem, a 32-4 glenosphere, and a standard poly insert. The base plate  fixation was excellent.  PROCEDURE: The patient was identified in the preoperative holding area  where I personally marked the operative site after verifying site, side,  and procedure with the patient. An interscalene block given by  the attending anesthesiologist in the holding area and the patient was taken back to the operating room where all extremities were  carefully padded in position after general anesthesia was induced. She  was placed in a beach-chair position and the operative upper extremity was  prepped and draped in a standard sterile fashion. An approximately 10-  cm incision was made from the tip of the coracoid process to the center  point of the humerus  at the level of the axilla. Dissection was carried  down through subcutaneous tissues to the level of the cephalic vein  which was taken laterally with the deltoid. The pectoralis major was  retracted medially. The subdeltoid space was developed and the lateral  edge of the conjoined tendon was identified. The undersurface of  conjoined tendon was palpated and the musculocutaneous nerve was not in  the field. Retractor was placed underneath the conjoined and second  retractor was placed lateral into the deltoid. The circumflex humeral  artery and vessels were identified and clamped and coagulated. The  biceps tendon was absent.  The subscapularis was taken down as a peel with the capsule.  The  joint was then gently externally rotated while the capsule was released  from the humeral neck around to just beyond the 6 o'clock position. At  this point, the joint was dislocated and the humeral head was presented  into the wound. The excessive osteophyte formation was removed with a  large rongeur.  The cutting guide was used to make the appropriate  head cut and the head was saved for potentially bone grafting.  The glenoid was exposed with the arm in an  abducted extended position. The anterior and posterior labrum were  completely excised and the capsule was released circumferentially to  allow for exposure of the glenoid for preparation. The 2.5 mm drill was  placed using the guide in 5-10 inferior angulation and the tap was then advanced in the same  hole. Small and large reamers were then used. The tap was then removed and the Metaglene was then screwed in with excellent purchase.  The peripheral guide was then used to drilled measured and filled peripheral locking screws.  The posterior screw was not filled.  The size 32-4 glenosphere was then impacted on the [facility] taper and the central screw was placed. The humerus was then again exposed and the diaphyseal reamers were used followed by the  metaphyseal reamers. The final broach was left in place in the proximal trial was placed. The joint was reduced and with this implant it was felt that soft tissue tensioning was appropriate with excellent stability and excellent range of motion. Therefore, final humeral stem was placed press-fit.  And then the trial polyethylene inserts were tested again and the above implant was felt to be the most appropriate for final insertion. The joint was reduced taken through full range of motion and felt to be stable. Soft tissue tension was appropriate.  The joint was then copiously irrigated with pulse  lavage and the wound was then closed. The subscapularis was repaired loosely with FiberWire through bone tunnel.  Skin was closed with 2-0 Vicryl in a deep dermal layer and 4-0  Monocryl for skin closure. Steri-Strips were applied. Sterile  dressings were then applied as well as a sling. The patient was allowed  to awaken from general anesthesia, transferred to stretcher, and taken  to recovery room in stable condition.   POSTOPERATIVE PLAN: The patient will be kept in the hospital postoperatively  for pain control and therapy.

## 2018-09-04 NOTE — H&P (Signed)
Samantha Stone is an 85 y.o. female.   Chief Complaint: R shoulder pain and dysfunction HPI: Endstage R shoulder arthritis with significant pain and dysfunction, failed conservative measures.  She has associated full thickness rotator cuff tear.  Pain interferes with sleep and quality of life.   Past Medical History:  Diagnosis Date  . Arthritis   . Cancer ([place])    basal cell skin cancer  . Concussion   . Depression   . Dyspnea    upon exertion  . Headache   . Hypertension   . Hypothyroidism   . Stress incontinence     Past Surgical History:  Procedure Laterality Date  . ABDOMINAL HYSTERECTOMY    . APPENDECTOMY    . CARPAL TUNNEL RELEASE     rt x2  . COLONOSCOPY    . EYE SURGERY Bilateral    cataract surgery with lens implant  . FINGER AMPUTATION Right    little finger  . I&D EXTREMITY  03/11/2012   Procedure: MINOR IRRIGATION AND DEBRIDEMENT EXTREMITY;  Surgeon: Cammie Sickle., MD;  Location: [facility];  Service: Orthopedics;  Laterality: Right;  Right little finger  . KNEE ARTHROSCOPY     bilat   . REPLACEMENT TOTAL KNEE BILATERAL    . TONSILLECTOMY    . TOTAL KNEE REVISION Right 03/14/2015   Procedure: TIBIAL REVISION, OPEN REDUCTION INTERNAL FIXATION PROXIMAL TIBIA;  Surgeon: Dorna Leitz, MD;  Location: [facility];  Service: Orthopedics;  Laterality: Right;  . TRIGGER FINGER RELEASE    . TUBAL LIGATION      Family History  Problem Relation Age of Onset  . Bladder Cancer Father    Social History:  reports that she has never smoked. She has never used smokeless tobacco. She reports current alcohol use of about 14.0 standard drinks of alcohol per week. She reports that she does not use drugs.  Allergies:  Allergies  Allergen Reactions  . Phenergan [Promethazine Hcl] Other (See Comments)    hallucinations  . Lisinopril Cough    Medications Prior to Admission  Medication Sig Dispense Refill  . aspirin EC 81 MG tablet Take 81 mg by mouth  daily.    . Calcium Carb-Cholecalciferol (CALCIUM 600/VITAMIN D3 PO) Take 1 tablet by mouth daily.    . celecoxib (CELEBREX) 200 MG capsule Take 200 mg by mouth daily.    . Cholecalciferol (VITAMIN D-3) 1000 units CAPS Take 1,000 Units by mouth daily.     . citalopram (CELEXA) 20 MG tablet Take 20 mg by mouth daily.    . ferrous sulfate 325 (65 FE) MG tablet Take 325 mg by mouth daily with breakfast.     . folic acid (FOLVITE) 765 MCG tablet Take 800 mcg by mouth daily.     Marland Kitchen levothyroxine (SYNTHROID, LEVOTHROID) 137 MCG tablet Take 137 mcg by mouth daily before breakfast.     . losartan (COZAAR) 100 MG tablet Take 100 mg by mouth daily.     . metoprolol tartrate (LOPRESSOR) 25 MG tablet Take 1 tablet (25 mg total) by mouth 2 (two) times daily. (Patient taking differently: Take 25 mg by mouth daily. ) 60 tablet 11  . mirabegron ER (MYRBETRIQ) 25 MG TB24 tablet Take 25 mg by mouth daily.     . Multiple Vitamins-Minerals (PRESERVISION AREDS 2 PO) Take 1 capsule by mouth 2 (two) times daily.    . naproxen sodium (ALEVE) 220 MG tablet Take 440 mg by mouth at bedtime.    Marland Kitchen  pravastatin (PRAVACHOL) 10 MG tablet Take 10 mg by mouth daily.    . traZODone (DESYREL) 50 MG tablet Take 50 mg by mouth at bedtime.    . vitamin C (ASCORBIC ACID) 500 MG tablet Take 500 mg by mouth daily.    . potassium chloride SA (K-DUR,KLOR-CON) 20 MEQ tablet Take 1 tablet (20 mEq total) by mouth daily. (Patient not taking: Reported on 08/18/2018) 90 tablet 3    No results found for this or any previous visit (from the past 48 hour(s)). No results found.  Review of Systems  All other systems reviewed and are negative.   Blood pressure (!) 177/72, pulse 63, temperature 98.2 F (36.8 C), SpO2 96 %. Physical Exam  Constitutional: She is oriented to person, place, and time. She appears well-developed and well-nourished.  HENT:  Head: Atraumatic.  Eyes: EOM are normal.  Cardiovascular: Intact distal pulses.   Respiratory: Effort normal.  Musculoskeletal:     Comments: R shoulder pain with limited ROM. NVID.  Neurological: She is alert and oriented to person, place, and time.  Skin: Skin is warm and dry.  Psychiatric: She has a normal mood and affect.     Assessment/Plan Endstage R shoulder arthritis with significant pain and dysfunction, failed conservative measures.  She has associated full thickness rotator cuff tear.  Pain interferes with sleep and quality of life.  Plan R reverse TSA Risks / benefits of surgery discussed Consent on chart  NPO for OR Preop antibiotics   Samantha Stalling, MD 09/04/2018, 7:16 AM

## 2018-09-04 NOTE — Anesthesia Procedure Notes (Signed)
Anesthesia Regional Block: Interscalene brachial plexus block   Pre-Anesthetic Checklist: ,, timeout performed, Correct Patient, Correct Site, Correct Laterality, Correct Procedure, Correct Position, site marked, Risks and benefits discussed,  Surgical consent,  Pre-op evaluation,  At surgeon's request and post-op pain management  Laterality: Right  Prep: chloraprep       Needles:  Injection technique: Single-shot  Needle Type: Echogenic Stimulator Needle     Needle Length: 9cm  Needle Gauge: 21     Additional Needles:   Procedures:,,,, ultrasound used (permanent image in chart),,,,  Narrative:  Start time: 09/04/2018 7:05 AM End time: 09/04/2018 7:09 AM Injection made incrementally with aspirations every 5 mL.  Performed by: Personally  Anesthesiologist: Lidia Collum, MD  Additional Notes: Monitors applied. Injection made in 5cc increments. No resistance to injection. Good needle visualization. Patient tolerated procedure well.

## 2018-09-04 NOTE — Transfer of Care (Signed)
Immediate Anesthesia Transfer of Care Note  Patient: Samantha Stone  Procedure(s) Performed: REVERSE SHOULDER ARTHROPLASTY (Right Shoulder)  Patient Location: PACU  Anesthesia Type:GA combined with regional for post-op pain  Level of Consciousness: awake, alert  and oriented  Airway & Oxygen Therapy: Patient Spontanous Breathing and Patient connected to face mask oxygen  Post-op Assessment: Report given to RN and Post -op Vital signs reviewed and stable  Post vital signs: Reviewed and stable  Last Vitals:  Vitals Value Taken Time  BP 162/64 09/04/2018 10:03 AM  Temp    Pulse 72 09/04/2018 10:05 AM  Resp 15 09/04/2018 10:05 AM  SpO2 94 % 09/04/2018 10:05 AM  Vitals shown include unvalidated device data.  Last Pain: There were no vitals filed for this visit.       Complications: No apparent anesthesia complications

## 2018-09-05 ENCOUNTER — Encounter (HOSPITAL_COMMUNITY): Payer: Self-pay

## 2018-09-05 NOTE — Progress Notes (Signed)
CSW spoke with patient daughter in room and patient who report they would like patient to return to [place] apartment with her small dog and have an occasional aid as needed for check ins after daughter leaves in 1 week.   RNCM provided resources. CSW notified [facility] of their decision.   [place], [place]

## 2018-09-05 NOTE — Progress Notes (Signed)
PATIENT ID: Samantha Stone  MRN: [id number]  DOB/AGE:  83/11/1930 / 83 y.o.  1 Day Post-Op Procedure(s) (LRB): REVERSE SHOULDER ARTHROPLASTY (Right)    PROGRESS NOTE Subjective:   Patient is alert, oriented, no Nausea, no Vomiting, yes passing gas, no Bowel Movement. Taking PO well. Denies SOB, Chest or Calf Pain. Using Incentive Spirometer, PAS in place. Ambulate WBAT, Patient reports pain as mild,    Objective: Vital signs in last 24 hours: Temp:  [97.6 F (36.4 C)-98.4 F (36.9 C)] 98.4 F (36.9 C) (02/06 2028) Pulse Rate:  [62-77] 75 (02/06 2028) Resp:  [14-20] 16 (02/06 2028) BP: (132-162)/(57-80) 136/57 (02/06 2028) SpO2:  [94 %-98 %] 94 % (02/06 2028)    Intake/Output from previous day: I/O last 3 completed shifts: In: 2593.3 [P.O.:440; I.V.:1953.3; Other:200] Out: 100 [Blood:100]   Intake/Output this shift: No intake/output data recorded.   LABORATORY DATA: No results for input(s): WBC, HGB, HCT, PLT, NA, K, CL, CO2, BUN, CREATININE, GLUCOSE, GLUCAP, INR, CALCIUM in the last 72 hours.  Invalid input(s): PT, 2  Examination: Neurologically intact Neurovascular intact Sensation intact distally Intact pulses distally Incision: dressing C/D/I and no drainage No cellulitis present Compartment soft}  Assessment:   1 Day Post-Op Procedure(s) (LRB): REVERSE SHOULDER ARTHROPLASTY (Right) ADDITIONAL DIAGNOSIS:  Hypertension  Plan:  Up with PT/OT Maintain sling Plan to D/C later today  Follow up in office with Dr. Tamera Punt in 10-14 days     Joanell Rising 09/05/2018, 8:03 AM

## 2018-09-05 NOTE — Discharge Summary (Addendum)
Patient ID: Samantha Stone MRN: [id number] DOB/AGE: [birth year] 85 y.o.  Admit date: 09/04/2018 Discharge date: 09/05/2018  Admission Diagnoses:  Active Problems:   Degenerative arthritis of right shoulder region   Discharge Diagnoses:  Same  Past Medical History:  Diagnosis Date  . Arthritis   . Cancer ([place])    basal cell skin cancer  . Concussion   . Depression   . Dyspnea    upon exertion  . Headache   . Hypertension   . Hypothyroidism   . Stress incontinence     Surgeries: Procedure(s): REVERSE SHOULDER ARTHROPLASTY on 09/04/2018   Consultants:   Discharged Condition: Improved  Hospital Course: Samantha Stone is an 85 y.o. female who was admitted 09/04/2018 for operative treatment of<principal problem not specified>. Patient has severe unremitting pain that affects sleep, daily activities, and work/hobbies. After pre-op clearance the patient was taken to the operating room on 09/04/2018 and underwent  Procedure(s): [place].    Patient was given perioperative antibiotics:  Anti-infectives (From admission, onward)   Start     Dose/Rate Route Frequency Ordered Stop   09/04/18 0600  ceFAZolin (ANCEF) 3 g in dextrose 5 % 50 mL IVPB     3 g 100 mL/hr over 30 Minutes Intravenous On call to O.R. 09/03/18 1313 09/04/18 0746       Patient was given sequential compression devices, early ambulation, and chemoprophylaxis to prevent DVT.  Patient benefited maximally from hospital stay and there were no complications.    Recent vital signs:  Patient Vitals for the past 24 hrs:  BP Temp Temp src Pulse Resp SpO2  09/05/18 1020 (!) 136/106 98.2 F (36.8 C) Oral 84 16 92 %  09/05/18 0841 (!) 156/49 - - 86 16 93 %  09/04/18 2028 (!) 136/57 98.4 F (36.9 C) Oral 75 16 94 %  09/04/18 1345 (!) 150/62 97.9 F (36.6 C) - 64 16 95 %  09/04/18 1330 (!) 148/67 98.1 F (36.7 C) - 71 18 97 %  09/04/18 1302 140/65 - - 64 20 94 %  09/04/18 1232 (!) 141/80 - - 67  19 96 %  09/04/18 1202 (!) 143/63 - - 77 17 94 %     Recent laboratory studies: No results for input(s): WBC, HGB, HCT, PLT, NA, K, CL, CO2, BUN, CREATININE, GLUCOSE, INR, CALCIUM in the last 72 hours.  Invalid input(s): PT, 2   Discharge Medications:   Allergies as of 09/05/2018      Reactions   Phenergan [promethazine Hcl] Other (See Comments)   hallucinations   Lisinopril Cough      Medication List    STOP taking these medications   naproxen sodium 220 MG tablet Commonly known as:  ALEVE     TAKE these medications   aspirin EC 81 MG tablet Take 81 mg by mouth daily.   CALCIUM 600/VITAMIN D3 PO Take 1 tablet by mouth daily.   celecoxib 200 MG capsule Commonly known as:  CELEBREX Take 200 mg by mouth daily.   citalopram 20 MG tablet Commonly known as:  CELEXA Take 20 mg by mouth daily.   ferrous sulfate 325 (65 FE) MG tablet Take 325 mg by mouth daily with breakfast.   folic acid 371 MCG tablet Commonly known as:  FOLVITE Take 800 mcg by mouth daily.   HYDROcodone-acetaminophen 5-325 MG tablet Commonly known as:  NORCO Take 1 tablet by mouth every 6 (six) hours as needed.   levothyroxine 137 MCG tablet Commonly  known as:  SYNTHROID, LEVOTHROID Take 137 mcg by mouth daily before breakfast.   losartan 100 MG tablet Commonly known as:  COZAAR Take 100 mg by mouth daily.   methocarbamol 500 MG tablet Commonly known as:  ROBAXIN Take 1 tablet (500 mg total) by mouth 2 (two) times daily with a meal.   metoprolol tartrate 25 MG tablet Commonly known as:  LOPRESSOR Take 1 tablet (25 mg total) by mouth 2 (two) times daily. What changed:  when to take this   MYRBETRIQ 25 MG Tb24 tablet Generic drug:  mirabegron ER Take 25 mg by mouth daily.   potassium chloride SA 20 MEQ tablet Commonly known as:  K-DUR,KLOR-CON Take 1 tablet (20 mEq total) by mouth daily.   pravastatin 10 MG tablet Commonly known as:  PRAVACHOL Take 10 mg by mouth daily.    PRESERVISION AREDS 2 PO Take 1 capsule by mouth 2 (two) times daily.   traZODone 50 MG tablet Commonly known as:  DESYREL Take 50 mg by mouth at bedtime.   vitamin C 500 MG tablet Commonly known as:  ASCORBIC ACID Take 500 mg by mouth daily.   Vitamin D-3 25 MCG (1000 UT) Caps Take 1,000 Units by mouth daily.       Diagnostic Studies: Dg Chest 2 View  Result Date: 08/27/2018 CLINICAL DATA:  Preop EXAM: CHEST - 2 VIEW COMPARISON:  11/27/2016 FINDINGS: Cardiomegaly. No confluent opacities, effusions or edema. No acute bony abnormality. Degenerative changes in the shoulders and thoracic spine. IMPRESSION: Cardiomegaly.  No active disease. Electronically Signed   By: Rolm Baptise M.D.   On: 08/27/2018 16:07   Dg Shoulder Right Port  Result Date: 09/04/2018 CLINICAL DATA:  S/P reverse total shoulder arthroplasty, right EXAM: PORTABLE RIGHT SHOULDER COMPARISON:  None. FINDINGS: RIGHT shoulder total arthroplasty hardware appears intact and appropriately position. Osseous alignment is anatomic. No osseous fracture line or displaced fracture fragment appreciated. Surrounding soft tissues are unremarkable. IMPRESSION: Status post RIGHT shoulder arthroplasty. Hardware appears intact and appropriately positioned. No evidence of surgical complicating feature. Electronically Signed   By: Franki Cabot M.D.   On: 09/04/2018 10:52    Disposition: Discharge disposition: 03-Skilled Nursing Facility       Discharge Instructions    Call MD / Call 911   Complete by:  As directed    If you experience chest pain or shortness of breath, CALL 911 and be transported to the hospital emergency room.  If you develope a fever above 101 F, pus (white drainage) or increased drainage or redness at the wound, or calf pain, call your surgeon's office.   Constipation Prevention   Complete by:  As directed    Drink plenty of fluids.  Prune juice may be helpful.  You may use a stool softener, such as Colace (over  the counter) 100 mg twice a day.  Use MiraLax (over the counter) for constipation as needed.   Diet - low sodium heart healthy   Complete by:  As directed    Driving restrictions   Complete by:  As directed    No driving for 2 weeks   Increase activity slowly as tolerated   Complete by:  As directed    Patient may shower   Complete by:  As directed    You may shower without a dressing once there is no drainage.  Do not wash over the wound.  If drainage remains, cover wound with plastic wrap and then shower.  Follow-up Information    Tania Ade, MD In 2 weeks.   Specialty:  Orthopedic Surgery Contact information: [address] [place] [address] [phone]            Signed: Evi Mccomb 09/05/2018, 11:33 AM

## 2018-09-05 NOTE — Progress Notes (Signed)
Patient discharging to [facility]. Report called in to Autumn RN. Patient left via personal car.

## 2018-09-05 NOTE — Progress Notes (Signed)
Occupational Therapy Evaluation Patient Details Name: Samantha Stone MRN: [id number] DOB: 1931-01-15 Today's Date: 09/05/2018  Clinical Impression: PTA pt independent in ADL and mobilty. Pt educated in sling management (able to return demo with assist from daughter) positioning, schedule, sequence for dressing and smart clothing options, safety for shower, exercises for hand, wrist, and elbow. Reviewed non-weight bearing with RUE. Pt is Left hand dominant. Pt able to perform transfers without use of RUE. Pt has appropriate set-up at ILF. At this time, feel Pt is appropriate for dc as education has been completed and therapy can be continued at follow up appointment with MD. OT to sign off acutely at this time. Thank you for the opportunity to serve this patient.    09/05/18 0800  OT Visit Information  Last OT Received On 09/05/18  Assistance Needed +1  History of Present Illness Pt is an 85 y/o female s/p R TSA. Pt has a past medical history including Arthritis, Cancer, Concussion, Depression, Dyspnea, Headache, Hypertension, Hypothyroidism, Stress incontinence, Bilateral TKA.   Precautions  Precautions Shoulder  Type of Shoulder Precautions conservative protocol  Shoulder Interventions Shoulder sling/immobilizer;At all times;Off for dressing/bathing/exercises  Precaution Booklet Issued Yes (comment)  Precaution Comments handout reviewed in full  Required Braces or Orthoses Sling  Restrictions  Weight Bearing Restrictions Yes  RUE Weight Bearing NWB  Home Living  Family/patient expects to be discharged to: Private residence  [place] (82 year old dog)  Available Help at Discharge Family;Available 24 hours/day (initially until thursday)  Type of [place] One level  Bathroom Shower/Tub Walk-in shower  Bathroom Toilet Handicapped height  Bathroom Accessibility Yes  How Accessible Accessible via walker;Accessible via wheelchair   [place] seat - built in;Grab bars - toilet;Grab bars - tub/shower  Prior Function  Level of Independence Independent  Comments plays pickleball, water aerobics, likes to be active  Communication  Communication No difficulties  Pain Assessment  Pain Assessment 0-10  Pain Score 1  Pain Location R shoulder  Pain Descriptors / Indicators Dull (block still mainly in place)  Pain Intervention(s) Limited activity within patient's tolerance;Monitored during session;Repositioned  Cognition  Arousal/Alertness Awake/alert  Behavior During Therapy WFL for tasks assessed/performed  Overall Cognitive Status Within Functional Limits for tasks assessed  Upper Extremity Assessment  Upper Extremity Assessment RUE deficits/detail  RUE Deficits / Details post-op deficits as anticipated, block still largely in place  RUE Unable to fully assess due to immobilization  RUE Sensation decreased light touch  RUE Coordination decreased fine motor;decreased gross motor  Lower Extremity Assessment  Lower Extremity Assessment Overall WFL for tasks assessed  ADL  General ADL Comments see shoulder section below for applicable ADL  Vision- History  Baseline Vision/History Wears glasses  Wears Glasses At all times  Patient Visual Report No change from baseline  Bed Mobility  Overal bed mobility Modified Independent  Transfers  Overall transfer level Modified independent  Balance  Overall balance assessment No apparent balance deficits (not formally assessed)  General Comments  General comments (skin integrity, edema, etc.) daughter present throughout session  Exercises  Exercises Shoulder  Shoulder Instructions  Donning/doffing shirt without moving shoulder Maximal assistance;Patient able to independently direct caregiver  Method for sponge bathing under operated UE Moderate assistance;Patient able to independently direct caregiver  Donning/doffing sling/immobilizer Maximal  assistance;Patient able to independently direct caregiver  Correct positioning of sling/immobilizer Moderate assistance;Patient able to independently direct caregiver  ROM for elbow, wrist and digits of operated  UE Supervision/safety  Sling wearing schedule (on at all times/off for ADL's) Independent  Proper positioning of operated UE when showering Supervision/safety  Positioning of UE while sleeping Supervision/safety  Shoulder Exercises  Elbow Flexion AROM;Right;Seated;10 reps (educated, not performed due to block)  Elbow Extension AROM;Right;10 reps;Seated (educated not performed)  Wrist Flexion AROM;Right  Wrist Extension AROM;Right  Digit Composite Flexion AROM;Right  Composite Extension AROM;Right  Neck Flexion AROM  Neck Extension AROM  Neck Lateral Flexion - Right AROM  Neck Lateral Flexion - Left AROM  OT - End of Session  Equipment Utilized During Treatment Other (comment) (sling)  Activity Tolerance Patient tolerated treatment well  Patient left in chair;with call bell/phone within reach  Nurse Communication Mobility status;Weight bearing status;Precautions  OT Assessment  OT Recommendation/Assessment Progress rehab of shoulder as ordered by MD at follow-up appointment  OT Visit Diagnosis Pain  Pain - Right/Left Right  Pain - part of body Shoulder  OT Problem List Decreased range of motion;Decreased knowledge of use of DME or AE;Decreased knowledge of precautions;Impaired sensation;Obesity;Impaired UE functional use;Pain;Increased edema  AM-PAC OT "6 Clicks" Daily Activity Outcome Measure (Version 2)  Help from another person eating meals? 3  Help from another person taking care of personal grooming? 3  Help from another person toileting, which includes using toliet, bedpan, or urinal? 3  Help from another person bathing (including washing, rinsing, drying)? 2  Help from another person to put on and taking off regular upper body clothing? 2  Help from another person  to put on and taking off regular lower body clothing? 2  6 Click Score 15  OT Recommendation  Follow Up Recommendations Follow surgeon's recommendation for DC plan and follow-up therapies;Supervision/Assistance - 24 hour (initially)  OT Equipment None recommended by OT  Acute Rehab OT Goals  Patient Stated Goal get back to exercise and travel to [place]  OT Goal Formulation With patient/family  Time For Goal Achievement 09/19/18  Potential to Achieve Goals Good  OT Time Calculation  OT Start Time (ACUTE ONLY) 1017  OT Stop Time (ACUTE ONLY) 1109  OT Time Calculation (min) 52 min  OT General Charges  $OT Visit 1 Visit  OT Evaluation  $OT Eval Moderate Complexity 1 Mod  OT Treatments  $Self Care/Home Management  8-22 mins  $Therapeutic Exercise 8-22 mins  Written Expression  Dominant Hand Left   Hulda Humphrey OTR/L Acute Rehabilitation Services Pager: [phone] Office: [phone]

## 2018-09-05 NOTE — Clinical Social Work Placement (Signed)
   CLINICAL SOCIAL WORK PLACEMENT  NOTE  Date:  09/05/2018  Patient Details  Name: Samantha Stone MRN: [id number] Date of Birth: 06/18/1931  Clinical Social Work is seeking post-discharge placement for this patient at the [place] level of care (*CSW will initial, date and re-position this form in  chart as items are completed):      Patient/family provided with [facility] Work Department's list of facilities offering this level of care within the geographic area requested by the patient (or if unable, by the patient's family).  Yes   Patient/family informed of their freedom to choose among providers that offer the needed level of care, that participate in Medicare, Medicaid or managed care program needed by the patient, have an available bed and are willing to accept the patient.      Patient/family informed of [facility]'s ownership interest in [facility] and [facility], as well as of the fact that they are under no obligation to receive care at these facilities.  PASRR submitted to EDS on       PASRR number received on 09/05/18     Existing PASRR number confirmed on       FL2 transmitted to all facilities in geographic area requested by pt/family on 09/05/18     FL2 transmitted to all facilities within larger geographic area on       Patient informed that his/her managed care company has contracts with or will negotiate with certain facilities, including the following:        Yes   Patient/family informed of bed offers received.  Patient chooses bed at Other - please specify in the comment section below:([facility])     Physician recommends and patient chooses bed at      Patient to be transferred to Other - please specify in the comment section below:([facility]) on 09/05/18.  Patient to be transferred to facility by Family     Patient family notified on 09/05/18 of transfer.  Name of family member notified:  [name]      PHYSICIAN       Additional Comment:    _______________________________________________ Alberteen Sam, LCSW 09/05/2018, 12:56 PM

## 2018-09-05 NOTE — Clinical Social Work Note (Signed)
Clinical Social Work Assessment  Patient Details  Name: Samantha Stone MRN: [id number] Date of Birth: 1930/11/22  Date of referral:  09/05/18               Reason for consult:  Discharge Planning                Permission sought to share information with:  Case Manager, Facility Sport and exercise psychologist, Family Supports Permission granted to share information::  Yes, Verbal Permission Granted  Name::     Samantha Stone  Agency::  SNFs  Relationship::  daughter  Contact Information:  [phone]  Housing/Transportation Living arrangements for the past 2 months:  Single Family Home(ILF at [employer]) Source of Information:  Patient Patient Interpreter Needed:  None Criminal Activity/Legal Involvement Pertinent to Current Situation/Hospitalization:  No - Comment as needed Significant Relationships:  Adult Children Lives with:  Self Do you feel safe going back to the place where you live?  No Need for family participation in patient care:  Yes (Comment)  Care giving concerns:  CSW received call from [place] stating patient wishes to go to SNF with [facility]. CSW spoke with daughter and patient who report patient wants to go home to her ILF apartment with her dog. After consulting, daughter spoke with [facility] and has changed mind, would like patient to go to SNF although not medically recommended or needed by physician or PT/OT at this time.   Social Worker assessment / plan:  Patient now plans to go to [facility] SNF as daughter was concerned regarding any future possible falls after patient starts taking pain meds.    Employment status:  Retired Nurse, adult PT Recommendations:  (back to [employer]) Information / Referral to community resources:  [place]  Patient/Family's Response to care:  Patient and daughter at bedside  Report they would like patient to go to SNF at discharge.  Patient/Family's Understanding of and Emotional Response  to Diagnosis, Current Treatment, and Prognosis:  Patient/family is realistic regarding therapy needs and expressed being hopeful for SNF placement. Patient expressed understanding of CSW role and discharge process as well as medical condition. No questions/concerns about plan or treatment.    Emotional Assessment Appearance:  Appears stated age Attitude/Demeanor/Rapport:  Gracious Affect (typically observed):  Accepting Orientation:  Oriented to Self, Oriented to Place, Oriented to  Time, Oriented to Situation Alcohol / Substance use:  Not Applicable Psych involvement (Current and /or in the community):  No (Comment)  Discharge Needs  Concerns to be addressed:  Lack of Support, Medication Concerns, Other (Comment Required(daughter concerns regarding falls after patient starts to take pain meds, will like patient to go to [facility] SNF upon discharge although not recommended by PT or physician. CSW following family wishes. ) Readmission within the last 30 days:  No Current discharge risk:  Lives alone Barriers to Discharge:  No Barriers Identified   Alberteen Sam, LCSW 09/05/2018, 12:41 PM

## 2018-09-05 NOTE — Plan of Care (Signed)

## 2018-09-05 NOTE — Progress Notes (Signed)
CSW received call from [place] as patient was living in [place] prior to shoulder surgery. [facility] reports patient needs to go back to their SNF and private pay for skilled nursing rehab. CSW explained this is not the current physician recommendation at this time, as patient is able to return to previous level of care.   RNCM also consulted with [facility] to educate medically patient does not meet needs for SNF, [facility] would like physician called and PT consulted as they believe patient needs SNF.    CSW will continue to follow up and communicate with treatment team regarding dc. Noted dc summary and orders are in.   [place], [place]

## 2018-09-05 NOTE — NC FL2 (Addendum)
[place] LEVEL OF CARE SCREENING TOOL     IDENTIFICATION  Patient Name: Samantha Stone Birthdate: 07/11/1931 Sex: female Admission Date (Current Location): 09/04/2018  [facility] and [place] Number:  Herbalist and Address:  [facility], [place] [address] [place], [place] [address]      Provider Number: [id number]  Attending Physician Name and Address:  Tania Ade, MD  Relative Name and Phone Number:  [name] (daughter) [phone]    Current Level of Care: Hospital Recommended Level of Care: Home Prior Approval Number:    Date Approved/Denied: 03/15/15 PASRR Number: [id number]  Discharge Plan: SNF(per family wishes)    Current Diagnoses: Patient Active Problem List   Diagnosis Date Noted  . Degenerative arthritis of right shoulder region 09/04/2018  . Morbid (severe) obesity due to excess calories ([place]) 08/28/2016  . Dyspnea on exertion 08/27/2016  . Cough variant asthma 08/27/2016  . Chronic diastolic congestive heart failure ([place]) 05/07/2016  . Postoperative anemia due to acute blood loss 03/17/2015  . Aseptic loosening of prosthetic knee (HCC) 03/14/2015  . 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 Confusion 06/16/2014  . Acute encephalopathy 06/16/2014  . HLD (hyperlipidemia) 06/16/2014  . Hypothyroidism 06/16/2014  . Frontal headache   . Aphasia 10/23/2013  . Essential hypertension 10/23/2013    Orientation RESPIRATION BLADDER Height & Weight     Self, Time, Situation, Place  Normal Continent Weight:   Height:     BEHAVIORAL SYMPTOMS/MOOD NEUROLOGICAL BOWEL NUTRITION STATUS      Continent Diet(see discharge summary)  AMBULATORY STATUS COMMUNICATION OF NEEDS Skin   Limited Assist Verbally Surgical wounds(right shoulder closed surgical incisions)                       Personal Care Assistance Level of Assistance   Bathing, Feeding, Dressing, Total care Bathing Assistance: Limited assistance Feeding assistance: Independent Dressing Assistance: Limited assistance Total Care Assistance: Limited assistance   Functional Limitations Info  Sight, Hearing, Speech Sight Info: Adequate Hearing Info: Adequate Speech Info: Adequate    SPECIAL CARE FACTORS FREQUENCY  (PT/OT not recommending SNF at this time)                    Contractures Contractures Info: Not present    Additional Factors Info  Code Status, Allergies Code Status Info: full Allergies Info: Phenergan (promethazine Hcl), Lisinopril           Current Medications (09/05/2018):  This is the current hospital active medication list Current Facility-Administered Medications  Medication Dose Route Frequency Provider Last Rate Last Dose  . 0.45 % NaCl with KCl 20 mEq / L infusion   Intravenous Continuous Karen, Huhta, PA-C 75 mL/hr at 09/05/18 5176    . acetaminophen (TYLENOL) tablet 325-650 mg  325-650 mg Oral Q6H PRN Leighton Parody, PA-C      . alum & mag hydroxide-simeth (MAALOX/MYLANTA) 200-200-20 MG/5ML suspension 30 mL  30 mL Oral Q4H PRN Leighton Parody, PA-C      . aspirin EC tablet 325 mg  325 mg Oral BID Leota, Maka, PA-C   325 mg at 09/05/18 1607  . bisacodyl (DULCOLAX) EC tablet 5 mg  5 mg Oral Daily PRN Leighton Parody, PA-C      . celecoxib (CELEBREX) capsule 200 mg  200 mg Oral Daily Joanell Rising K, PA-C   200  mg at 09/05/18 0953  . citalopram (CELEXA) tablet 20 mg  20 mg Oral Daily Savina, Olshefski, PA-C   20 mg at 09/05/18 0867  . diphenhydrAMINE (BENADRYL) 12.5 MG/5ML elixir 12.5-25 mg  12.5-25 mg Oral Q4H PRN Leighton Parody, PA-C      . docusate sodium (COLACE) capsule 100 mg  100 mg Oral BID Braylea, Brancato, PA-C   100 mg at 09/05/18 6195  . ferrous sulfate tablet 325 mg  325 mg Oral Q breakfast Ulonda, Klosowski, PA-C   325 mg at 09/05/18 0932  . HYDROcodone-acetaminophen (NORCO) 7.5-325 MG per  tablet 1-2 tablet  1-2 tablet Oral Q4H PRN Leighton Parody, PA-C      . HYDROcodone-acetaminophen (NORCO/VICODIN) 5-325 MG per tablet 1-2 tablet  1-2 tablet Oral Q4H PRN Leighton Parody, PA-C      . levothyroxine (SYNTHROID, LEVOTHROID) tablet 137 mcg  137 mcg Oral Q0600 Austen, Wygant, PA-C   137 mcg at 09/05/18 6712  . losartan (COZAAR) tablet 100 mg  100 mg Oral Daily Jaisha, [place]ac[place], PA-C   100 mg at 09/05/18 4580  . menthol-cetylpyridinium (CEPACOL) lozenge 3 mg  1 lozenge Oral PRN Leighton Parody, PA-C       Or  . phenol (CHLORASEPTIC) mouth spray 1 spray  1 spray Mouth/Throat PRN Leighton Parody, PA-C      . metoCLOPramide (REGLAN) tablet 5-10 mg  5-10 mg Oral Q8H PRN Leighton Parody, PA-C       Or  . metoCLOPramide (REGLAN) injection 5-10 mg  5-10 mg Intravenous Q8H PRN Leighton Parody, PA-C      . metoprolol tartrate (LOPRESSOR) tablet 25 mg  25 mg Oral Daily Trevor, Duty, PA-C   25 mg at 09/05/18 9983  . mirabegron ER (MYRBETRIQ) tablet 25 mg  25 mg Oral Daily Lafern, Brinkley, PA-C   25 mg at 09/05/18 3825  . morphine 2 MG/ML injection 0.5-1 mg  0.5-1 mg Intravenous Q2H PRN Leighton Parody, PA-C      . ondansetron [facility]) tablet 4 mg  4 mg Oral Q6H PRN Leighton Parody, PA-C       Or  . ondansetron [facility]) injection 4 mg  4 mg Intravenous Q6H PRN Leighton Parody, PA-C      . polyethylene glycol (MIRALAX / GLYCOLAX) packet 17 g  17 g Oral Daily PRN Leighton Parody, PA-C      . sodium phosphate (FLEET) 7-19 GM/118ML enema 1 enema  1 enema Rectal Once PRN Leighton Parody, PA-C      . traZODone (DESYREL) tablet 50 mg  50 mg Oral QHS Shakeema, Lippman, PA-C   50 mg at 09/04/18 2113  . vitamin C (ASCORBIC ACID) tablet 500 mg  500 mg Oral Daily Kimmy, Totten, PA-C   500 mg at 09/05/18 0539     Discharge Medications: Please see discharge summary for a list of discharge medications.  Relevant Imaging Results:  Relevant Lab Results:   Additional Information SSN:  [id number]  Alberteen Sam, LCSW

## 2018-09-05 NOTE — Progress Notes (Signed)
Patient will DC to:[facility] Anticipated DC date: 09/05/2018 Family notified: Samantha Stone at bedside Transport by: family  Per MD patient ready for DC to [facility] . RN, patient, patient's family, and facility notified of DC. Discharge Summary sent to facility. RN given number for report [phone] Room 157. DC packet on chart.Family notified they are able to transport patient to [facility]. CSW signing off.  [place], [place]

## 2018-09-09 ENCOUNTER — Encounter: Payer: Self-pay | Admitting: Internal Medicine

## 2018-09-09 ENCOUNTER — Non-Acute Institutional Stay (SKILLED_NURSING_FACILITY): Payer: Medicare Other | Admitting: Internal Medicine

## 2018-09-09 DIAGNOSIS — D62 Acute posthemorrhagic anemia: Secondary | ICD-10-CM

## 2018-09-09 DIAGNOSIS — J45991 Cough variant asthma: Secondary | ICD-10-CM

## 2018-09-09 DIAGNOSIS — I5032 Chronic diastolic (congestive) heart failure: Secondary | ICD-10-CM

## 2018-09-09 DIAGNOSIS — I10 Essential (primary) hypertension: Secondary | ICD-10-CM

## 2018-09-09 DIAGNOSIS — R0600 Dyspnea, unspecified: Secondary | ICD-10-CM

## 2018-09-09 DIAGNOSIS — Z96611 Presence of right artificial shoulder joint: Secondary | ICD-10-CM | POA: Diagnosis not present

## 2018-09-09 DIAGNOSIS — R0609 Other forms of dyspnea: Secondary | ICD-10-CM

## 2018-09-09 NOTE — Progress Notes (Signed)
Patient ID: Samantha Stone, female   DOB: 04/11/1931, 85 y.o.   MRN: [id number]  Provider:  Rexene Edison. Mariea Clonts, D.O., C.M.D. Location:  [place] Room Number: [place] of Service:  SNF (31)  PCP: [name], MD Patient Care Team: [name], MD as PCP - General (Family Medicine) Nahser, Wonda Cheng, MD as PCP - Cardiology (Cardiology)  Extended Emergency Contact Information Primary Emergency Contact: [facility] Address: [address]          [place], [place] [address] Johnnette Litter of [place] Phone: [phone] Mobile Phone: [phone] Relation: Daughter Secondary Emergency Contact: Hipolito Bayley Address: [place] [place]          [place], [place] [address] Johnnette Litter of [place] Phone: [phone] Mobile Phone: [phone] Relation: Son  Code Status: DNR Goals of Care: Advanced Directive information Advanced Directives 09/09/2018  Does Patient Have a Medical Advance Directive? Yes  Type of Paramedic of [place];Living will  Does patient want to make changes to medical advance directive? No - Patient declined  Copy of [place] in Chart? Yes - validated most recent copy scanned in chart (See row information)      Chief Complaint  Patient presents with  . New Admit To SNF    Rehab admission    HPI: Patient is a 85 y.o. female seen today for admission to [facility] rehab s/p right reverse total shoulder arthroplasty by Dr. Tamera Punt on 2/6.  She has a h/o morbid obesity, chronic diastolic chf, htn, hyperlipidemia, cough variant asthma, hypothyroidism, and "transient global amnesia".  She reports she's had dyspnea on exertion ongoing and workup has not revealed a true cause.  Her BP has been running up lately even prior to admission and remains markedly elevated here with the automatic cuff.  She is agreeable to further investigation of this.  She also has been on celebrex  leading up to her shoulder surgery and agrees to try weaning off of this due to its potential impact.  We also discussed possible sleep apnea for which she has not been tested, but admits to snoring.    As far as her shoulder pain post-op, she reports hardly having any.  She is doing great with this and using minimal tylenol and robaxin.  Very rarely taking norco.  Has sling for the shoulder.    Bowels are moving with her bowel regimen.  Her dyspnea on exertion and some wheezing are reportedly baseline for her.  Sats are ok, but she was getting oxygen for comfort at times overnight.  Past Medical History:  Diagnosis Date  . Arthritis   . Cancer ([place])    basal cell skin cancer  . Concussion   . Depression   . Dyspnea    upon exertion  . Headache   . Hypertension   . Hypothyroidism   . Stress incontinence    Past Surgical History:  Procedure Laterality Date  . ABDOMINAL HYSTERECTOMY    . APPENDECTOMY    . CARPAL TUNNEL RELEASE     rt x2  . COLONOSCOPY    . EYE SURGERY Bilateral    cataract surgery with lens implant  . FINGER AMPUTATION Right    little finger  . I&D EXTREMITY  03/11/2012   Procedure: MINOR IRRIGATION AND DEBRIDEMENT EXTREMITY;  Surgeon: Cammie Sickle., MD;  Location: [facility];  Service: Orthopedics;  Laterality: Right;  Right little finger  . KNEE ARTHROSCOPY  bilat   . REPLACEMENT TOTAL KNEE BILATERAL    . REVERSE SHOULDER ARTHROPLASTY Right 09/04/2018   Procedure: REVERSE SHOULDER ARTHROPLASTY;  Surgeon: Tania Ade, MD;  Location: [facility];  Service: Orthopedics;  Laterality: Right;  . TONSILLECTOMY    . TOTAL KNEE REVISION Right 03/14/2015   Procedure: TIBIAL REVISION, OPEN REDUCTION INTERNAL FIXATION PROXIMAL TIBIA;  Surgeon: Dorna Leitz, MD;  Location: [facility];  Service: Orthopedics;  Laterality: Right;  . TRIGGER FINGER RELEASE    . TUBAL LIGATION      reports that she has never smoked. She has never used smokeless tobacco. She  reports current alcohol use of about 14.0 standard drinks of alcohol per week. She reports that she does not use drugs. Social History   Socioeconomic History  . Marital status: Widowed    Spouse name: Not on file  . Number of children: Not on file  . Years of education: Not on file  . Highest education level: Not on file  Occupational History  . Not on file  Social Needs  . Financial resource strain: Not on file  . Food insecurity:    Worry: Not on file    Inability: Not on file  . Transportation needs:    Medical: Not on file    Non-medical: Not on file  Tobacco Use  . Smoking status: Never Smoker  . Smokeless tobacco: Never Used  Substance and Sexual Activity  . Alcohol use: Yes    Alcohol/week: 14.0 standard drinks    Types: 14 Cans of beer per week    Comment: "couple" beers daily  . Drug use: No  . Sexual activity: Not on file  Lifestyle  . Physical activity:    Days per week: Not on file    Minutes per session: Not on file  . Stress: Not on file  Relationships  . Social connections:    Talks on phone: Not on file    Gets together: Not on file    Attends religious service: Not on file    Active member of club or organization: Not on file    Attends meetings of clubs or organizations: Not on file    Relationship status: Not on file  . Intimate partner violence:    Fear of current or ex partner: Not on file    Emotionally abused: Not on file    Physically abused: Not on file    Forced sexual activity: Not on file  Other Topics Concern  . Not on file  Social History Narrative  . Not on file    Functional Status Survey:    Family History  Problem Relation Age of Onset  . Bladder Cancer Father     Health Maintenance  Topic Date Due  . TETANUS/TDAP  02/20/1952  . DEXA SCAN  02/19/1998  . PNA vac Low Risk Adult (1 of 2 - PCV13) 02/19/1998  . INFLUENZA VACCINE  02/27/2018    Allergies  Allergen Reactions  . Phenergan [Promethazine Hcl] Other (See  Comments)    hallucinations  . Lisinopril Cough    Outpatient Encounter Medications as of 09/09/2018  Medication Sig  . aspirin EC 81 MG tablet Take 81 mg by mouth daily.  . Calcium Carb-Cholecalciferol (CALCIUM 600/VITAMIN D3 PO) Take 1 tablet by mouth daily.  . celecoxib (CELEBREX) 200 MG capsule Take 200 mg by mouth daily.  . Cholecalciferol (VITAMIN D-3) 1000 units CAPS Take 1,000 Units by mouth daily.   . citalopram (CELEXA) 20 MG tablet  Take 20 mg by mouth daily.  Marland Kitchen docusate sodium (COLACE) 100 MG capsule Take 100 mg by mouth 2 (two) times daily.  . ferrous sulfate 325 (65 FE) MG tablet Take 325 mg by mouth daily with breakfast.   . folic acid (FOLVITE) 829 MCG tablet Take 800 mcg by mouth daily.   Marland Kitchen HYDROcodone-acetaminophen (NORCO) 5-325 MG tablet Take 1 tablet by mouth every 6 (six) hours as needed.  Marland Kitchen levothyroxine (SYNTHROID, LEVOTHROID) 137 MCG tablet Take 137 mcg by mouth daily before breakfast.   . losartan (COZAAR) 100 MG tablet Take 100 mg by mouth daily.   . methocarbamol (ROBAXIN) 500 MG tablet Take 1 tablet (500 mg total) by mouth 2 (two) times daily with a meal.  . metoprolol tartrate (LOPRESSOR) 25 MG tablet Take 1 tablet (25 mg total) by mouth 2 (two) times daily.  . mirabegron ER (MYRBETRIQ) 25 MG TB24 tablet Take 25 mg by mouth daily.   . Multiple Vitamins-Minerals (PRESERVISION AREDS 2 PO) Take 1 capsule by mouth 2 (two) times daily.  . potassium chloride SA (K-DUR,KLOR-CON) 20 MEQ tablet Take 1 tablet (20 mEq total) by mouth daily.  . pravastatin (PRAVACHOL) 10 MG tablet Take 10 mg by mouth daily.  . traZODone (DESYREL) 50 MG tablet Take 50 mg by mouth at bedtime.  . vitamin C (ASCORBIC ACID) 500 MG tablet Take 500 mg by mouth daily.   No facility-administered encounter medications on file as of 09/09/2018.     Review of Systems  Constitutional: Negative for activity change, appetite change, fatigue, fever and unexpected weight change.  HENT: Negative for  congestion and trouble swallowing.   Eyes: Negative for visual disturbance.  Respiratory: Positive for shortness of breath and wheezing. Negative for cough, choking and chest tightness.   Gastrointestinal: Negative for abdominal pain, constipation, diarrhea, nausea and vomiting.  Genitourinary: Negative for dysuria.       Some leakage--uses pad  Musculoskeletal: Positive for arthralgias. Negative for gait problem and joint swelling.  Neurological: Negative for dizziness and weakness.  Hematological: Does not bruise/bleed easily.       Bruising of right upper arm from surgery  Psychiatric/Behavioral: Positive for sleep disturbance. Negative for agitation and confusion. The patient is not nervous/anxious.        Trazodone works well--taking since her husband's death a few years ago    Vitals:   09/09/18 1042  BP: (!) 171/78  Pulse: 93  Resp: 20  Temp: 98 F (36.7 C)  TempSrc: Oral  SpO2: 97%  Weight: 267 lb (121.1 kg)  Height: 5\' 4"  (1.626 m)   Body mass index is 45.83 kg/m. Physical Exam Vitals signs and nursing note reviewed.  Constitutional:      General: She is not in acute distress.    Appearance: Normal appearance. She is obese. She is not ill-appearing or toxic-appearing.  HENT:     Head: Normocephalic and atraumatic.     Right Ear: External ear normal.     Left Ear: External ear normal.     Nose: Nose normal. No congestion.     Mouth/Throat:     Mouth: Mucous membranes are moist.     Pharynx: Oropharynx is clear.  Eyes:     Extraocular Movements: Extraocular movements intact.     Conjunctiva/sclera: Conjunctivae normal.     Pupils: Pupils are equal, round, and reactive to light.  Cardiovascular:     Rate and Rhythm: Normal rate and regular rhythm.     Pulses: Normal pulses.  Heart sounds: Normal heart sounds.  Pulmonary:     Effort: Pulmonary effort is normal.     Breath sounds: Normal breath sounds. No wheezing, rhonchi or rales.  Abdominal:     General:  Bowel sounds are normal. There is no distension.     Palpations: Abdomen is soft. There is no mass.     Tenderness: There is no abdominal tenderness. There is no guarding or rebound.     Hernia: No hernia is present.  Musculoskeletal:        General: Swelling present.     Comments: Mild swelling of right shoulder and upper arm, purple ecchymoses dependently in right upper arm, wearing sling, dressing intact without drainage beyond borders; no pitting edema, some chronic venous insufficiency  Skin:    General: Skin is warm and dry.     Capillary Refill: Capillary refill takes less than 2 seconds.  Neurological:     General: No focal deficit present.     Mental Status: She is alert and oriented to person, place, and time. Mental status is at baseline.  Psychiatric:        Mood and Affect: Mood normal.        Behavior: Behavior normal.        Thought Content: Thought content normal.        Judgment: Judgment normal.     Labs reviewed: Basic Metabolic Panel: Recent Labs    08/27/18 1121  NA 138  K 4.4  CL 103  CO2 30  GLUCOSE 104*  BUN 16  CREATININE 0.85  CALCIUM 9.3   Liver Function Tests: Recent Labs    08/27/18 1121  AST 25  ALT 18  ALKPHOS 65  BILITOT 0.9  PROT 6.3*  ALBUMIN 3.6   No results for input(s): LIPASE, AMYLASE in the last 8760 hours. No results for input(s): AMMONIA in the last 8760 hours. CBC: Recent Labs    08/27/18 1121  WBC 5.2  NEUTROABS 2.9  HGB 13.3  HCT 41.4  MCV 90.2  PLT 282   Cardiac Enzymes: No results for input(s): CKTOTAL, CKMB, CKMBINDEX, TROPONINI in the last 8760 hours. BNP: Invalid input(s): POCBNP Lab Results  Component Value Date   HGBA1C 5.5 06/16/2014   Lab Results  Component Value Date   TSH 1.21 11/27/2016   Lab Results  Component Value Date   [id number] 06/16/2014   No results found for: FOLATE No results found for: IRON, TIBC, FERRITIN  Imaging and Procedures obtained prior to SNF admission: Dg  Chest 2 View  Result Date: 08/27/2018 CLINICAL DATA:  Preop EXAM: CHEST - 2 VIEW COMPARISON:  11/27/2016 FINDINGS: Cardiomegaly. No confluent opacities, effusions or edema. No acute bony abnormality. Degenerative changes in the shoulders and thoracic spine. IMPRESSION: Cardiomegaly.  No active disease. Electronically Signed   By: Rolm Baptise M.D.   On: 08/27/2018 16:07    Assessment/Plan 1. Chronic diastolic congestive heart failure (HCC) -cont current regimen, weigh daily and monitor, notify us for weight gains, decreased sats, worsening lung sounds or edema  2. Morbid (severe) obesity due to excess calories ([place]) -ongoing -using hs O2 for comfort -likely needs sleep study  3. S/P reverse total shoulder arthroplasty, right -recovering well so far, keep ortho f/u and continue PT, OT  4. Cough variant asthma -cont same regimen, no wheezing for me and not coughing either  5. Dyspnea on exertion -chronic, has been worked up--appears it's multifactorial with her asthma and her diastolic chf and deconditioning  6. Essential hypertension -not well controlled -taper off celebrex--change to bid for a week, then stop -cont use of tylenol and robaxin for pain -with bp checks, repeat manually if over 239 systolic, provide results to providers for review in about a week so meds can be adjusted accordingly--will be interesting if better on days w/o nsaid  7. Postoperative anemia due to acute blood loss -f/u cbc, is on iron daily  Family/ staff Communication: discussed with snf nurse  Labs/tests ordered:  No orders of the defined types were placed in this encounter.  Addalee Kavanagh L. Chaelyn Bunyan, D.O. [facility] [address], [place] [address] Cell Phone (Mon-Fri 8am-5pm):  [phone] On Call:  [phone] & follow prompts after 5pm & weekends Office Phone:  [phone] Office Fax:  [phone]

## 2018-10-07 ENCOUNTER — Encounter: Payer: Self-pay | Admitting: Internal Medicine

## 2018-10-15 ENCOUNTER — Encounter: Payer: Self-pay | Admitting: Internal Medicine

## 2018-10-15 ENCOUNTER — Other Ambulatory Visit: Payer: Self-pay

## 2018-10-15 ENCOUNTER — Non-Acute Institutional Stay: Payer: Medicare Other | Admitting: Internal Medicine

## 2018-10-15 VITALS — BP 120/70 | HR 69 | Temp 98.6°F | Ht 64.0 in | Wt 257.0 lb

## 2018-10-15 DIAGNOSIS — J45991 Cough variant asthma: Secondary | ICD-10-CM | POA: Diagnosis not present

## 2018-10-15 DIAGNOSIS — H9113 Presbycusis, bilateral: Secondary | ICD-10-CM | POA: Insufficient documentation

## 2018-10-15 DIAGNOSIS — Z96611 Presence of right artificial shoulder joint: Secondary | ICD-10-CM | POA: Insufficient documentation

## 2018-10-15 DIAGNOSIS — E039 Hypothyroidism, unspecified: Secondary | ICD-10-CM

## 2018-10-15 DIAGNOSIS — H353131 Nonexudative age-related macular degeneration, bilateral, early dry stage: Secondary | ICD-10-CM | POA: Insufficient documentation

## 2018-10-15 DIAGNOSIS — I5032 Chronic diastolic (congestive) heart failure: Secondary | ICD-10-CM | POA: Diagnosis not present

## 2018-10-15 DIAGNOSIS — N3945 Continuous leakage: Secondary | ICD-10-CM | POA: Insufficient documentation

## 2018-10-15 DIAGNOSIS — I10 Essential (primary) hypertension: Secondary | ICD-10-CM

## 2018-10-15 MED ORDER — TRAZODONE HCL 50 MG PO TABS: 50.0000 mg | ORAL_TABLET | Freq: Every day | ORAL | 1 refills | Status: DC

## 2018-10-15 NOTE — Progress Notes (Signed)
Provider:  Rexene Edison. Mariea Clonts, D.O., C.M.D. Location:  Occupational psychologist of Service:  Clinic (12)  Previous PCP: [name], MD Patient Care Team: [name], MD as PCP - General (Family Medicine) Nahser, Wonda Cheng, MD as PCP - Cardiology (Cardiology) Tania Ade, MD as Consulting Physician (Orthopedic Surgery)  Extended Emergency Contact Information Primary Emergency Contact: [facility] Address: [address]          [place], [place] [address] Johnnette Litter of [place] Phone: [phone] Mobile Phone: [phone] Relation: Daughter Secondary Emergency Contact: Hipolito Bayley Address: [place], [place] [address] Johnnette Litter of [place] Phone: [phone] Mobile Phone: [phone] Relation: Son  Code Status: DNR Goals of Care: Advanced Directive information Advanced Directives 09/09/2018  Does Patient Have a Medical Advance Directive? Yes  Type of Paramedic of [place];Living will  Does patient want to make changes to medical advance directive? No - Patient declined  Copy of Corder in Chart? Yes - validated most recent copy scanned in chart (See row information)   Chief Complaint  Patient presents with  . Establish Care    New Patient to [facility]    HPI: Patient is a 85 y.o. female seen today to establish with [facility]. Pt was seen during rehab here after her total reverse shoulder arthroplasty by Dr. Tamera Punt and decided it was convenient to be seen here in [facility] rather than going out of here PCP, Dr. [name].    Shoulder is coming along well.  She's continued with therapy.  OT is coming to her.  She has a stiff neck.  Is ahead of schedule with her mobility per Dr. Tamera Punt.  The hardest part is getting dressed.  She's had to skip her bra.    BP had been high in rehab but great today.  We had stopped her celebrex in rehab in hopes that would help.   She has not missed the celebrex either.  Has occasional muscle spasms but no pain in the joint itself.    She may have sleep apnea, but has not been tested.  She's been sleeping well.  She had been on two trazodone before but now on one.  She's hearing people talking getting their temps checked for the virus at 6am (on first floor of [place]).    She has some hoarseness.  She's not sure why she has this.  She has had chronic cough that comes and goes for many years.  Chart indicates cough variant asthma.      Has a little dog she walks--averages 2 miles per day.  The other day she walked all the way around the swimming pool and almost got lost.    She takes a chronic iron supplement.  Not anemic even postop.  She had a bone density done across from [facility]--? [facility].    Has family h/o macular degneration.  Two sisters  And brother in their 48s have it and are legally blind.  She has very mild macular and takes preservision areds2.   No longer getting cscopes.     Sees derm routinely--has basal cells removed from left arm.  Had one on the edge of her left ear also.  Couldn't see it.  Hairdresser kept track.    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   Now just one cup per day.  On myrbetriq but continues to have urinary leakage and wears a pad.  Not specific to coughing or sneezing and not just after urge.  Wears a longer pad at hs.  In good spirits.  On chronic celexa since around the time her husband was ill and later passed away.  Past Medical History:  Diagnosis Date  . Arthritis   . Cancer ([place])    basal cell skin cancer  . Concussion   . Depression   . Dyspnea    upon exertion  . Headache   . Hypertension   . Hypothyroidism   . Stress incontinence    Past Surgical History:  Procedure Laterality Date  . ABDOMINAL HYSTERECTOMY    . APPENDECTOMY    . CARPAL TUNNEL RELEASE     rt x2  . COLONOSCOPY     . EYE SURGERY Bilateral    cataract surgery with lens implant  . FINGER AMPUTATION Right    little finger  . I&D EXTREMITY  03/11/2012   Procedure: MINOR IRRIGATION AND DEBRIDEMENT EXTREMITY;  Surgeon: Cammie Sickle., MD;  Location: [place];  Service: Orthopedics;  Laterality: Right;  Right little finger  . KNEE ARTHROSCOPY     bilat   . REPLACEMENT TOTAL KNEE BILATERAL    . REVERSE SHOULDER ARTHROPLASTY Right 09/04/2018   Procedure: REVERSE SHOULDER ARTHROPLASTY;  Surgeon: Tania Ade, MD;  Location: [place];  Service: Orthopedics;  Laterality: Right;  . TONSILLECTOMY    . TOTAL KNEE REVISION Right 03/14/2015   Procedure: TIBIAL REVISION, OPEN REDUCTION INTERNAL FIXATION PROXIMAL TIBIA;  Surgeon: Dorna Leitz, MD;  Location: [place];  Service: Orthopedics;  Laterality: Right;  . TRIGGER FINGER RELEASE    . TUBAL LIGATION      Social History   Socioeconomic History  . Marital status: Widowed    Spouse name: Not on file  . Number of children: Not on file  . Years of education: Not on file  . Highest education level: Not on file  Occupational History  . Not on file  Social Needs  . Financial resource strain: Not on file  . Food insecurity:    Worry: Not on file    Inability: Not on file  . Transportation needs:    Medical: Not on file    Non-medical: Not on file  Tobacco Use  . Smoking status: Never Smoker  . Smokeless tobacco: Never Used  Substance and Sexual Activity  . Alcohol use: Yes    Alcohol/week: 14.0 standard drinks    Types: 14 Cans of beer per week    Comment: "couple" beers daily  . Drug use: No  . Sexual activity: Not on file  Lifestyle  . Physical activity:    Days per week: Not on file    Minutes per session: Not on file  . Stress: Not on file  Relationships  . Social connections:    Talks on phone: Not on file    Gets together: Not on file    Attends religious service: Not on file    Active member of club or organization: Not  on file    Attends meetings of clubs or organizations: Not on file    Relationship status: Not on file  Other Topics Concern  . Not on file  Social History Narrative   Tobacco use, amount per day now: NEVER   Past tobacco use, amount per day:   How  many years did you use tobacco:   Alcohol use (drinks per week): BEER COUPLE A DAY   Diet: REGULAR   Do you drink/eat things with caffeine: COFFEE ONCE A DAY   Marital status:   WIDOW                               What year were you married? 1ST TIME 1956 DIED 2ND 1987 DIED 2013-11-17   Do you live in a house, apartment, assisted living, condo, trailer, etc.? APARTMENT   Is it one or more stories? ONE   How many persons live in your home? ONE   Do you have pets in your home?( please list) [place]   Current or past profession: RN   Do you exercise?        NOT OFTEN ENOUGH                          Type and how often? BEFORE SHOULDER SURG, I WALKED AND SWAM   Do you have a living will? YES   Do you have a DNR form?   YES                                If not, do you want to discuss one?   Do you have signed POA/HPOA forms?      YES                   If so, please bring to you appointment    reports that she has never smoked. She has never used smokeless tobacco. She reports current alcohol use of about 14.0 standard drinks of alcohol per week. She reports that she does not use drugs.  Functional Status Survey:    Family History  Problem Relation Age of Onset  . Bladder Cancer Father     Health Maintenance  Topic Date Due  . DEXA SCAN  02/19/1998  . PNA vac Low Risk Adult (2 of 2 - PPSV23) 02/28/2016  . TETANUS/TDAP  02/27/2021  . INFLUENZA VACCINE  Completed    Allergies  Allergen Reactions  . Phenergan [Promethazine Hcl] Other (See Comments)    hallucinations  . Lisinopril Cough    Outpatient Encounter Medications as of 10/15/2018  Medication Sig  . aspirin EC 81 MG tablet Take 81 mg by mouth daily.  . Calcium  Carb-Cholecalciferol (CALCIUM 600/VITAMIN D3 PO) Take 1 tablet by mouth daily.  . Cholecalciferol (VITAMIN D-3) 1000 units CAPS Take 1,000 Units by mouth daily.   . citalopram (CELEXA) 20 MG tablet Take 20 mg by mouth daily.  Marland Kitchen docusate sodium (COLACE) 100 MG capsule Take 100 mg by mouth 2 (two) times daily.  . ferrous sulfate 325 (65 FE) MG tablet Take 325 mg by mouth daily with breakfast.   . folic acid (FOLVITE) 161 MCG tablet Take 800 mcg by mouth daily.   Marland Kitchen HYDROcodone-acetaminophen (NORCO) 5-325 MG tablet Take 1 tablet by mouth every 6 (six) hours as needed.  Marland Kitchen levothyroxine (SYNTHROID, LEVOTHROID) 137 MCG tablet Take 137 mcg by mouth daily before breakfast.   . losartan (COZAAR) 100 MG tablet Take 100 mg by mouth daily.   . methocarbamol (ROBAXIN) 500 MG tablet Take 1 tablet (500 mg total) by mouth 2 (two) times daily with a meal.  . metoprolol tartrate (  LOPRESSOR) 25 MG tablet Take 1 tablet (25 mg total) by mouth 2 (two) times daily.  . mirabegron ER (MYRBETRIQ) 25 MG TB24 tablet Take 25 mg by mouth daily.   . Multiple Vitamins-Minerals (PRESERVISION AREDS 2 PO) Take 1 capsule by mouth 2 (two) times daily.  . potassium chloride SA (K-DUR,KLOR-CON) 20 MEQ tablet Take 1 tablet (20 mEq total) by mouth daily.  . pravastatin (PRAVACHOL) 10 MG tablet Take 10 mg by mouth daily.  . traZODone (DESYREL) 50 MG tablet Take 50 mg by mouth at bedtime.  . vitamin C (ASCORBIC ACID) 500 MG tablet Take 500 mg by mouth daily.  . [DISCONTINUED] celecoxib (CELEBREX) 200 MG capsule Take 200 mg by mouth daily.   No facility-administered encounter medications on file as of 10/15/2018.     Review of Systems  Constitutional: Negative for chills, fever and malaise/fatigue.  HENT: Positive for hearing loss. Negative for congestion.        But does not wear hearing aids as prescribed  Eyes: Negative for blurred vision.       Very mild macular degeneration on preservision; has reading glasses  Respiratory:  Positive for cough and shortness of breath. Negative for sputum production and wheezing.   Cardiovascular: Positive for palpitations. Negative for chest pain, orthopnea, leg swelling and PND.       Chronic puffiness laterally at ankles  Gastrointestinal: Negative for abdominal pain, blood in stool, constipation, diarrhea and melena.  Genitourinary: Negative for dysuria, frequency and urgency.       Some degree of continuous leakage  Musculoskeletal: Positive for back pain and myalgias. Negative for falls and joint pain.       Right shoulder muscle spasms occasionally, but resolve; neck stiff this morning after how she slept--looking forward to OT session to help this  Skin: Negative for itching and rash.  Neurological: Negative for loss of consciousness and headaches.  Endo/Heme/Allergies: Does not bruise/bleed easily.       Thyroid disorder  Psychiatric/Behavioral: Negative for depression and memory loss. The patient is not nervous/anxious and does not have insomnia.        Sleeping well now with just one trazodone and spirits good    Vitals:   10/15/18 1017  BP: 120/70  Pulse: 69  Temp: 98.6 F (37 C)  TempSrc: Oral  SpO2: 95%  Weight: 257 lb (116.6 kg)  Height: 5\' 4"  (1.626 m)   Body mass index is 44.11 kg/m. Physical Exam Vitals signs reviewed.  Constitutional:      General: She is not in acute distress.    Appearance: Normal appearance. She is obese. She is not ill-appearing, toxic-appearing or diaphoretic.  HENT:     Head: Normocephalic and atraumatic.     Right Ear: Tympanic membrane, ear canal and external ear normal. There is no impacted cerumen.     Left Ear: Tympanic membrane, ear canal and external ear normal. There is no impacted cerumen.     Ears:     Comments: Small amt of cerumen right ear    Nose: Nose normal.     Mouth/Throat:     Pharynx: Oropharynx is clear. No oropharyngeal exudate.  Eyes:     Conjunctiva/sclera: Conjunctivae normal.     Pupils:  Pupils are equal, round, and reactive to light.  Neck:     Musculoskeletal: Normal range of motion and neck supple. No muscular tenderness.  Cardiovascular:     Rate and Rhythm: Normal rate and regular rhythm.  Pulses: Normal pulses.     Heart sounds: Normal heart sounds.  Pulmonary:     Effort: Pulmonary effort is normal.     Breath sounds: Normal breath sounds. No wheezing, rhonchi or rales.  Abdominal:     General: Bowel sounds are normal. There is no distension.     Palpations: Abdomen is soft.     Tenderness: There is no abdominal tenderness.  Musculoskeletal:        General: No tenderness or deformity.     Comments: Slight lateral swelling of both ankles; right shoulder ROM remains limited, but improving  Lymphadenopathy:     Cervical: No cervical adenopathy.  Skin:    General: Skin is warm and dry.     Comments: Right shoulder incision well healed  Neurological:     General: No focal deficit present.     Mental Status: She is alert and oriented to person, place, and time. Mental status is at baseline.     Cranial Nerves: No cranial nerve deficit.     Motor: No weakness.     Coordination: Coordination normal.     Gait: Gait normal.     Comments: Absent patellar DTRs with tkas  Psychiatric:        Mood and Affect: Mood normal.        Behavior: Behavior normal.        Thought Content: Thought content normal.        Judgment: Judgment normal.     Labs reviewed: Basic Metabolic Panel: Recent Labs    08/27/18 1121  NA 138  K 4.4  CL 103  CO2 30  GLUCOSE 104*  BUN 16  CREATININE 0.85  CALCIUM 9.3   Liver Function Tests: Recent Labs    08/27/18 1121  AST 25  ALT 18  ALKPHOS 65  BILITOT 0.9  PROT 6.3*  ALBUMIN 3.6   No results for input(s): LIPASE, AMYLASE in the last 8760 hours. No results for input(s): AMMONIA in the last 8760 hours. CBC: Recent Labs    08/27/18 1121  WBC 5.2  NEUTROABS 2.9  HGB 13.3  HCT 41.4  MCV 90.2  PLT 282   Cardiac  Enzymes: No results for input(s): CKTOTAL, CKMB, CKMBINDEX, TROPONINI in the last 8760 hours. BNP: Invalid input(s): POCBNP Lab Results  Component Value Date   HGBA1C 5.5 06/16/2014   Lab Results  Component Value Date   TSH 1.21 11/27/2016   Lab Results  Component Value Date   [id number] 06/16/2014   No results found for: FOLATE No results found for: IRON, TIBC, FERRITIN  Imaging and Procedures noted on new patient packet:  mammo and bone density at [facility] it sounds like--need copies--requested; no longer getting cscopes  Assessment/Plan 1. Chronic diastolic congestive heart failure ([place]) -now well controlled, cont losartan and lopressor, not on diuretics -she opted not to pursue sleep study though we did discuss in rehab and again today  2. Morbid (severe) obesity due to excess calories ([place]) -ongoing, encouraged increased exercise and avoiding high fat high carb meals  3. S/P reverse total shoulder arthroplasty, right -by Dr. Tamera Punt, has recovered really well, just a bit of ROM to work on at this point  4. Cough variant asthma -longstanding, not on any inhalers or meds for this -would prescribe albuterol if cough becomes an issue with allergy season approaching  5. Essential hypertension -bp now controlled with current regimen -cont same meds and monitor  6. Presbycusis of both ears -ongoing, does not  like to wear hearing aids as prescribed  7. Early dry stage nonexudative age-related macular degeneration of both eyes -uses glasses and takes preservision areds2, followed by Dr. Talbert Forest  8. Continuous leakage of urine -using myrbetriq for now, monitor for improvement--if not noted, would d/c, use protective undergarments  Labs/tests ordered:  Cbc, cmp, flp, tsh before Care team updated.  F/u 4 mos for med mgt, fasting labs before  Await records to be sure she's up to date on vaccines.  Tenasia Aull L. Prabhnoor Ellenberger, D.O. [facility] [address], [place] [address] Cell Phone (Mon-Fri 8am-5pm):  [phone] On Call:  [phone] & follow prompts after 5pm & weekends Office Phone:  [phone] Office Fax:  [phone]

## 2018-11-23 ENCOUNTER — Other Ambulatory Visit: Payer: Self-pay | Admitting: Internal Medicine

## 2019-01-20 ENCOUNTER — Telehealth: Payer: Self-pay

## 2019-01-20 NOTE — Telephone Encounter (Signed)
10/10 chest pain due to fall. Fall occurred today about 2:00 pm at [employer]. Patient fell and hit chest on rail. [facility] nurse is calling requesting mobile chest xray order. Patient does not wish to go to the hospital for she will have to be quarantine for 14 days when she returns.   Mliss Sax is aware Dr.Reed is out of office and Dr.Alexander is covering calls from [place].   Please advise

## 2019-01-21 ENCOUNTER — Encounter: Payer: Self-pay | Admitting: Internal Medicine

## 2019-01-21 NOTE — Telephone Encounter (Signed)
Dr.Alexander called the office requesting to speak with me. I was on another call at the time. Edwin Dada, A[place] spoke with Dr.Alexander and gave her the number to contact the clinic nurse at [facility] to further discuss patients concern.  Dr.Alexander then called back stating she was unable to get through. Dee verified the number again and Dr.Alexander was going to call [facility].

## 2019-01-21 NOTE — Telephone Encounter (Signed)
Spoke with Vergie Living independent clinic nurse for today and she did have a verbal conversation with Dr. Sheppard Coil and she did order chest and knee xray.

## 2019-02-10 LAB — BASIC METABOLIC PANEL
BUN: 18 (ref 4–21)
Creatinine: 0.8 (ref 0.5–1.1)
Glucose: 90
Potassium: 4.6 (ref 3.4–5.3)
Sodium: 140 (ref 137–147)

## 2019-02-10 LAB — HEPATIC FUNCTION PANEL
ALT: 14 (ref 7–35)
AST: 23 (ref 13–35)
Alkaline Phosphatase: 78 (ref 25–125)
Bilirubin, Total: 0.5

## 2019-02-10 LAB — CBC AND DIFFERENTIAL
HCT: 40 (ref 36–46)
Hemoglobin: 13.5 (ref 12.0–16.0)
Platelets: 255 (ref 150–399)
WBC: 5.2

## 2019-02-10 LAB — LIPID PANEL
Cholesterol: 189 (ref 0–200)
HDL: 53 (ref 35–70)
LDL Cholesterol: 115
Triglycerides: 103 (ref 40–160)

## 2019-02-10 LAB — TSH: TSH: 0.35 — AB (ref 0.41–5.90)

## 2019-02-13 ENCOUNTER — Encounter: Payer: Self-pay | Admitting: Internal Medicine

## 2019-02-18 ENCOUNTER — Non-Acute Institutional Stay: Payer: Medicare Other | Admitting: Internal Medicine

## 2019-02-18 ENCOUNTER — Encounter: Payer: Self-pay | Admitting: Internal Medicine

## 2019-02-18 ENCOUNTER — Other Ambulatory Visit: Payer: Self-pay

## 2019-02-18 VITALS — BP 130/70 | HR 63 | Temp 98.4°F | Ht 64.0 in | Wt 260.0 lb

## 2019-02-18 DIAGNOSIS — J45991 Cough variant asthma: Secondary | ICD-10-CM

## 2019-02-18 DIAGNOSIS — Z96611 Presence of right artificial shoulder joint: Secondary | ICD-10-CM

## 2019-02-18 DIAGNOSIS — S2232XS Fracture of one rib, left side, sequela: Secondary | ICD-10-CM

## 2019-02-18 DIAGNOSIS — F325 Major depressive disorder, single episode, in full remission: Secondary | ICD-10-CM

## 2019-02-18 DIAGNOSIS — E7800 Pure hypercholesterolemia, unspecified: Secondary | ICD-10-CM

## 2019-02-18 DIAGNOSIS — Z23 Encounter for immunization: Secondary | ICD-10-CM

## 2019-02-18 MED ORDER — SHINGRIX 50 MCG/0.5ML IM SUSR: 0.5000 mL | Freq: Once | INTRAMUSCULAR | 1 refills | Status: AC

## 2019-02-18 MED ORDER — CITALOPRAM HYDROBROMIDE 10 MG PO TABS: 10.0000 mg | ORAL_TABLET | Freq: Every day | ORAL | 0 refills | Status: DC

## 2019-02-18 MED ORDER — PRAVASTATIN SODIUM 40 MG PO TABS: 40.0000 mg | ORAL_TABLET | Freq: Every day | ORAL | 3 refills | Status: DC

## 2019-02-18 NOTE — Progress Notes (Signed)
Location:  Occupational psychologist of Service:  Clinic (12)  Provider: Bobbi Kozakiewicz L. Mariea Clonts, D.O., C.M.D.  Code Status: DNR Goals of Care:  Advanced Directives 09/09/2018  Does Patient Have a Medical Advance Directive? Yes  Type of Paramedic of [place];Living will  Does patient want to make changes to medical advance directive? No - Patient declined  Copy of [place] in Chart? Yes - validated most recent copy scanned in chart (See row information)   Chief Complaint  Patient presents with  . Medical Management of Chronic Issues    58mth follow-up    HPI: Patient is a 85 y.o. female seen today for medical management of chronic diseases.    They are going to do a picnic for her birthday.    She's been eating more fruit including a banana each am.  Her sugar is better and triglycerides.  LDL remains high.    She does not think she needs to take the celexa anymore.  She is very content.  Her daughter sent her adult coloring books and puzzles.  She would rather color than iron.    She has taken up water aerobics.  She fell 5-6 wks coming out of [facility].  She was going up the railing to the upper parking lot.  The railing hit her in the chest and almost knocked the breath out of her.  Two nice young men helped her up.  She scraped hte skin off the back of her left hand.  She scraped her right pinky.  Then realized she was bleeding--was all over her white t shirt.  At check-in here, they made her get checked.  She did go to ortho.  She got her total knees xrayed and shoulder.  They thought she had a cracked rib.  She can cough and sneeze now.    She has her chronic cough.  She thinks she suppressed it with the cracked rib.  She's seen pulmonary, ENT all about it.    Water aerobics did aggravate her right total shoulder.  She is going to go this afternoon, but will avoid what bothered that.    Sleeping ok with trazodone.   She can only take one.  Cholesterol remains high.  She's on low dose pravachol.    Past Medical History:  Diagnosis Date  . Arthritis   . Cancer ([place])    basal cell skin cancer  . Concussion   . Depression   . Dyspnea    upon exertion  . Headache   . Hypertension   . Hypothyroidism   . Stress incontinence     Past Surgical History:  Procedure Laterality Date  . ABDOMINAL HYSTERECTOMY    . APPENDECTOMY    . CARPAL TUNNEL RELEASE     rt x2  . COLONOSCOPY    . EYE SURGERY Bilateral    cataract surgery with lens implant  . FINGER AMPUTATION Right    little finger  . I&D EXTREMITY  03/11/2012   Procedure: MINOR IRRIGATION AND DEBRIDEMENT EXTREMITY;  Surgeon: Cammie Sickle., MD;  Location: [place];  Service: Orthopedics;  Laterality: Right;  Right little finger  . KNEE ARTHROSCOPY     bilat   . REPLACEMENT TOTAL KNEE BILATERAL    . REVERSE SHOULDER ARTHROPLASTY Right 09/04/2018   Procedure: REVERSE SHOULDER ARTHROPLASTY;  Surgeon: Tania Ade, MD;  Location: [place];  Service: Orthopedics;  Laterality: Right;  .  TONSILLECTOMY    . TOTAL KNEE REVISION Right 03/14/2015   Procedure: TIBIAL REVISION, OPEN REDUCTION INTERNAL FIXATION PROXIMAL TIBIA;  Surgeon: Dorna Leitz, MD;  Location: [facility];  Service: Orthopedics;  Laterality: Right;  . TRIGGER FINGER RELEASE    . TUBAL LIGATION      Allergies  Allergen Reactions  . Phenergan [Promethazine Hcl] Other (See Comments)    hallucinations  . Lisinopril Cough    Outpatient Encounter Medications as of 02/18/2019  Medication Sig  . aspirin EC 81 MG tablet Take 81 mg by mouth daily.  . Calcium Carb-Cholecalciferol (CALCIUM 600/VITAMIN D3 PO) Take 1 tablet by mouth daily.  . Cholecalciferol (VITAMIN D-3) 1000 units CAPS Take 1,000 Units by mouth daily.   . citalopram (CELEXA) 20 MG tablet Take 20 mg by mouth daily.  Marland Kitchen docusate sodium (COLACE) 100 MG capsule Take 100 mg by mouth 2 (two) times daily.  .  ferrous sulfate 325 (65 FE) MG tablet Take 325 mg by mouth daily with breakfast.   . folic acid (FOLVITE) 850 MCG tablet Take 800 mcg by mouth daily.   Marland Kitchen levothyroxine (SYNTHROID, LEVOTHROID) 137 MCG tablet Take 137 mcg by mouth daily before breakfast.   . losartan (COZAAR) 100 MG tablet Take 100 mg by mouth daily.   . metoprolol tartrate (LOPRESSOR) 25 MG tablet TAKE 1 TABLET BY MOUTH TWICE A DAY WITH FOOD  . mirabegron ER (MYRBETRIQ) 25 MG TB24 tablet Take 25 mg by mouth daily.   . Multiple Vitamins-Minerals (PRESERVISION AREDS 2 PO) Take 1 capsule by mouth 2 (two) times daily.  . potassium chloride SA (K-DUR,KLOR-CON) 20 MEQ tablet Take 1 tablet (20 mEq total) by mouth daily.  . pravastatin (PRAVACHOL) 10 MG tablet Take 10 mg by mouth daily.  . traZODone (DESYREL) 50 MG tablet Take 1 tablet (50 mg total) by mouth at bedtime.  . vitamin C (ASCORBIC ACID) 500 MG tablet Take 500 mg by mouth daily.   No facility-administered encounter medications on file as of 02/18/2019.     Review of Systems:  Review of Systems  Constitutional: Negative for chills, fever and malaise/fatigue.  HENT: Negative for congestion.   Eyes: Negative for blurred vision.  Respiratory: Negative for cough and shortness of breath.   Cardiovascular: Negative for chest pain and leg swelling.  Gastrointestinal: Negative for abdominal pain, blood in stool, constipation, diarrhea and melena.  Genitourinary: Negative for dysuria.  Musculoskeletal: Positive for falls.       Broke her rib  Skin: Negative for itching and rash.  Neurological: Negative for dizziness and loss of consciousness.  Endo/Heme/Allergies: Does not bruise/bleed easily.  Psychiatric/Behavioral: Negative for depression and memory loss. The patient is not nervous/anxious and does not have insomnia.     Health Maintenance  Topic Date Due  . DEXA SCAN  02/19/1998  . PNA vac Low Risk Adult (2 of 2 - PPSV23) 02/28/2016  . INFLUENZA VACCINE  02/28/2019  .  TETANUS/TDAP  02/27/2021    Physical Exam: Vitals:   02/18/19 0947  BP: 130/70  Pulse: 63  Temp: 98.4 F (36.9 C)  TempSrc: Oral  SpO2: 97%  Weight: 260 lb (117.9 kg)  Height: 5\' 4"  (1.626 m)   Body mass index is 44.63 kg/m. Physical Exam Vitals signs reviewed.  Constitutional:      General: She is not in acute distress.    Appearance: Normal appearance. She is obese. She is not ill-appearing or toxic-appearing.  HENT:     Head: Normocephalic and  atraumatic.     Nose: No congestion.  Cardiovascular:     Rate and Rhythm: Normal rate and regular rhythm.     Pulses: Normal pulses.     Heart sounds: No murmur.  Pulmonary:     Effort: Pulmonary effort is normal.     Breath sounds: Normal breath sounds.  Musculoskeletal: Normal range of motion.     Right lower leg: No edema.     Left lower leg: No edema.  Skin:    General: Skin is warm and dry.     Capillary Refill: Capillary refill takes less than 2 seconds.  Neurological:     General: No focal deficit present.     Mental Status: She is alert and oriented to person, place, and time. Mental status is at baseline.     Gait: Gait normal.     Labs reviewed: Basic Metabolic Panel: Recent Labs    08/27/18 1121 02/10/19 0300  NA 138 140  K 4.4 4.6  CL 103  --   CO2 30  --   GLUCOSE 104*  --   BUN 16 18  CREATININE 0.85 0.8  CALCIUM 9.3  --   TSH  --  0.35*   Liver Function Tests: Recent Labs    08/27/18 1121 02/10/19 0300  AST 25 23  ALT 18 14  ALKPHOS 65 78  BILITOT 0.9  --   PROT 6.3*  --   ALBUMIN 3.6  --    No results for input(s): LIPASE, AMYLASE in the last 8760 hours. No results for input(s): AMMONIA in the last 8760 hours. CBC: Recent Labs    08/27/18 1121 02/10/19 0300  WBC 5.2 5.2  NEUTROABS 2.9  --   HGB 13.3 13.5  HCT 41.4 40  MCV 90.2  --   PLT 282 255   Lipid Panel: Recent Labs    02/10/19 0300  CHOL 189  HDL 53  LDLCALC 115  TRIG 103   Lab Results  Component Value  Date   HGBA1C 5.5 06/16/2014    Assessment/Plan 1. Pure hypercholesterolemia - she is working on some weight loss and trying to exercise more - increase the pravachol to 40mg   - pravastatin (PRAVACHOL) 40 MG tablet; Take 1 tablet (40 mg total) by mouth daily.  Dispense: 90 tablet; Refill: 3  2. Morbid (severe) obesity due to excess calories ([place]) -she has begun some increased exercise with water aerobics and walking--encouraged gradual increase to help with weight loss and cholesterol control  3. S/P reverse total shoulder arthroplasty, right -had some increased pain with water aerobics but took a break and plans to avoid the exercises that made it hurt to begin with  4. Cough variant asthma -ongoing coughing and she does not accept this as the diagnosis  5. Closed fracture of one rib of left side, sequela -with fall -symptoms of pain and splinting breath resolved  6. Depression, major, single episode, complete remission ([place]) - reduced celexa to 10mg  daily for 2 weeks, then stop  -citalopram (CELEXA) 10 MG tablet; Take 1 tablet (10 mg total) by mouth daily.  Dispense: 14 tablet; Refill: 0  7. Need for shingles vaccine - she is going to go to [facility] to get her shingles vaccines - Zoster Vaccine Adjuvanted [facility]) injection; Inject 0.5 mLs into the muscle once for 1 dose.  Dispense: 0.5 mL; Refill: 1  Labs/tests ordered:  Cbc with diff, cmp, flp, hba1c, tsh  Next appt:  6 mos for CPE, fasting labs  before  Aya Geisel L. Stefany Starace, D.O. [facility] [address], [place] [address] Cell Phone (Mon-Fri 8am-5pm):  [phone] On Call:  [phone] & follow prompts after 5pm & weekends Office Phone:  [phone] Office Fax:  [phone]

## 2019-03-28 ENCOUNTER — Other Ambulatory Visit: Payer: Self-pay | Admitting: Internal Medicine

## 2019-04-22 ENCOUNTER — Other Ambulatory Visit: Payer: Self-pay | Admitting: Emergency Medicine

## 2019-04-22 DIAGNOSIS — Z20822 Contact with and (suspected) exposure to covid-19: Secondary | ICD-10-CM

## 2019-04-24 LAB — NOVEL CORONAVIRUS, NAA: SARS-CoV-2, NAA: NOT DETECTED

## 2019-05-24 ENCOUNTER — Other Ambulatory Visit: Payer: Self-pay | Admitting: Internal Medicine

## 2019-05-25 ENCOUNTER — Other Ambulatory Visit: Payer: Self-pay | Admitting: Internal Medicine

## 2019-06-04 ENCOUNTER — Ambulatory Visit (INDEPENDENT_AMBULATORY_CARE_PROVIDER_SITE_OTHER): Payer: Medicare Other | Admitting: Nurse Practitioner

## 2019-06-04 ENCOUNTER — Encounter: Payer: Self-pay | Admitting: Nurse Practitioner

## 2019-06-04 ENCOUNTER — Other Ambulatory Visit: Payer: Self-pay

## 2019-06-04 VITALS — Ht 64.0 in | Wt 260.0 lb

## 2019-06-04 DIAGNOSIS — E2839 Other primary ovarian failure: Secondary | ICD-10-CM | POA: Diagnosis not present

## 2019-06-04 DIAGNOSIS — Z0000 Encounter for general adult medical examination without abnormal findings: Secondary | ICD-10-CM | POA: Diagnosis not present

## 2019-06-04 DIAGNOSIS — E7800 Pure hypercholesterolemia, unspecified: Secondary | ICD-10-CM | POA: Diagnosis not present

## 2019-06-04 DIAGNOSIS — E039 Hypothyroidism, unspecified: Secondary | ICD-10-CM

## 2019-06-04 MED ORDER — LEVOTHYROXINE SODIUM 137 MCG PO TABS: 137.0000 ug | ORAL_TABLET | Freq: Every day | ORAL | 1 refills | Status: DC

## 2019-06-04 MED ORDER — PRAVASTATIN SODIUM 40 MG PO TABS: 40.0000 mg | ORAL_TABLET | Freq: Every day | ORAL | 3 refills | Status: DC

## 2019-06-04 NOTE — Progress Notes (Signed)
Subjective:   Samantha Stone is a 85 y.o. female who presents for Medicare Annual (Subsequent) preventive examination.  Review of Systems:   Cardiac Risk Factors include: obesity (BMI >30kg/m2);advanced age (>86men, >55 women);hypertension     Objective:     Vitals: Ht 5\' 4"  (1.626 m)   Wt 260 lb (117.9 kg)   BMI 44.63 kg/m   Body mass index is 44.63 kg/m.  Advanced Directives 06/04/2019 06/04/2019 09/09/2018 09/04/2018 08/27/2018 09/18/2015 03/08/2015  Does Patient Have a Medical Advance Directive? Yes Yes Yes Yes Yes Yes Yes  Type of Paramedic of [place];Living will [place];Living will [place];Living will [place];Living will [place];Living will - [place];Living will  Does patient want to make changes to medical advance directive? No - Patient declined No - Patient declined No - Patient declined No - Patient declined No - Patient declined - No - Patient declined  Copy of [place] in Chart? Yes - validated most recent copy scanned in chart (See row information) Yes - validated most recent copy scanned in chart (See row information) Yes - validated most recent copy scanned in chart (See row information) - No - copy requested - No - copy requested    Tobacco Social History   Tobacco Use  Smoking Status Never Smoker  Smokeless Tobacco Never Used     Counseling given: Not Answered   Clinical Intake:  Pre-visit preparation completed: Yes  Pain : No/denies pain     BMI - recorded: 44.63 Nutritional Status: BMI > 30  Obese Nutritional Risks: None  How often do you need to have someone help you when you read instructions, pamphlets, or other written materials from your doctor or pharmacy?: 1 - Never 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  Arthritis   . Cancer ([place])    basal cell skin cancer  . Concussion   . Depression   . Dyspnea    upon exertion  . Headache   . Hypertension   . Hypothyroidism   . Stress incontinence    Past Surgical History:  Procedure Laterality Date  . ABDOMINAL HYSTERECTOMY    . APPENDECTOMY    . CARPAL TUNNEL RELEASE     rt x2  . COLONOSCOPY    . EYE SURGERY Bilateral    cataract surgery with lens implant  . FINGER AMPUTATION Right    little finger  . I&D EXTREMITY  03/11/2012   Procedure: MINOR IRRIGATION AND DEBRIDEMENT EXTREMITY;  Surgeon: Cammie Sickle., MD;  Location: [facility];  Service: Orthopedics;  Laterality: Right;  Right little finger  . KNEE ARTHROSCOPY     bilat   . REPLACEMENT TOTAL KNEE BILATERAL    . REVERSE SHOULDER ARTHROPLASTY Right 09/04/2018   Procedure: REVERSE SHOULDER ARTHROPLASTY;  Surgeon: Tania Ade, MD;  Location: [facility];  Service: Orthopedics;  Laterality: Right;  . TONSILLECTOMY    . TOTAL KNEE REVISION Right 03/14/2015   Procedure: TIBIAL REVISION, OPEN REDUCTION INTERNAL FIXATION PROXIMAL TIBIA;  Surgeon: Dorna Leitz, MD;  Location: [facility];  Service: Orthopedics;  Laterality: Right;  . TRIGGER FINGER RELEASE    . TUBAL LIGATION     Family History  Problem Relation Age of Onset  . Bladder Cancer Father    Social History  Socioeconomic History  . Marital status: Widowed    Spouse name: Not on file  . Number of children: Not on file  . Years of education: Not on file  . Highest education level: Not on file  Occupational History  . Not on file  Social Needs  . Financial resource strain: Not on file  . Food insecurity    Worry: Not on file    Inability: Not on file  . Transportation needs    Medical: Not on file    Non-medical: Not on file  Tobacco Use  . Smoking status: Never Smoker  . Smokeless tobacco: Never Used  Substance and Sexual Activity  . Alcohol use: Yes    Alcohol/week: 14.0 standard drinks    Types: 14  Cans of beer per week    Comment: "couple" beers daily  . Drug use: No  . Sexual activity: Not on file  Lifestyle  . Physical activity    Days per week: Not on file    Minutes per session: Not on file  . Stress: Not on file  Relationships  . Social Herbalist on phone: Not on file    Gets together: Not on file    Attends religious service: Not on file    Active member of club or organization: Not on file    Attends meetings of clubs or organizations: Not on file    Relationship status: Not on file  Other Topics Concern  . Not on file  Social History Narrative   Tobacco use, amount per day now: NEVER   Past tobacco use, amount per day:   How many years did you use tobacco:   Alcohol use (drinks per week): BEER COUPLE A DAY   Diet: REGULAR   Do you drink/eat things with caffeine: COFFEE ONCE A DAY   Marital status:   WIDOW                               What year were you married? 1ST TIME 1956 DIED 2ND 1987 DIED 11/01/13   Do you live in a house, apartment, assisted living, condo, trailer, etc.? APARTMENT   Is it one or more stories? ONE   How many persons live in your home? ONE   Do you have pets in your home?( please list) [place]   Current or past profession: RN   Do you exercise?        NOT OFTEN ENOUGH                          Type and how often? BEFORE SHOULDER SURG, I WALKED AND SWAM   Do you have a living will? YES   Do you have a DNR form?   YES                                If not, do you want to discuss one?   Do you have signed POA/HPOA forms?      YES                   If so, please bring to you appointment    Outpatient Encounter Medications as of 06/04/2019  Medication Sig  . aspirin EC 81 MG tablet Take 81 mg by mouth daily.  . Calcium Carb-Cholecalciferol (CALCIUM  600/VITAMIN D3 PO) Take 1 tablet by mouth daily.  . Cholecalciferol (VITAMIN D-3) 1000 units CAPS Take 1,000 Units by mouth daily.   . ferrous sulfate 325 (65 FE) MG tablet Take  325 mg by mouth daily with breakfast.   . folic acid (FOLVITE) [id number] MCG tablet Take 800 mcg by mouth daily.   Marland Kitchen levothyroxine (SYNTHROID) 137 MCG tablet Take 1 tablet (137 mcg total) by mouth daily before breakfast.  . losartan (COZAAR) 100 MG tablet TAKE 1/2 TABLET (50MG ) BY MOUTH EVERY DAY **50 MG TABS BACKORDERED  . metoprolol tartrate (LOPRESSOR) 25 MG tablet TAKE 1 TABLET BY MOUTH TWICE A DAY WITH FOOD  . Multiple Vitamins-Minerals (PRESERVISION AREDS 2 PO) Take 1 capsule by mouth 2 (two) times daily.  . pravastatin (PRAVACHOL) 40 MG tablet Take 1 tablet (40 mg total) by mouth daily.  . traZODone (DESYREL) 50 MG tablet Take 1 tablet (50 mg total) by mouth at bedtime.  . vitamin C (ASCORBIC ACID) 500 MG tablet Take 500 mg by mouth daily.  . [DISCONTINUED] levothyroxine (SYNTHROID) 137 MCG tablet TAKE 1 TABLET BY MOUTH EVERY DAY ON EMPTY STOMACH IN THE MORNING  . [DISCONTINUED] citalopram (CELEXA) 10 MG tablet Take 1 tablet (10 mg total) by mouth daily.  . [DISCONTINUED] docusate sodium (COLACE) 100 MG capsule Take 100 mg by mouth 2 (two) times daily.  . [DISCONTINUED] mirabegron ER (MYRBETRIQ) 25 MG TB24 tablet Take 25 mg by mouth daily.   . [DISCONTINUED] potassium chloride SA (K-DUR,KLOR-CON) 20 MEQ tablet Take 1 tablet (20 mEq total) by mouth daily.   No facility-administered encounter medications on file as of 06/04/2019.     Activities of Daily Living In your present state of health, do you have any difficulty performing the following activities: 06/04/2019 09/04/2018  Hearing? Tempie Donning  Vision? N N  Difficulty concentrating or making decisions? N N  Walking or climbing stairs? Y Y  Dressing or bathing? N N  Doing errands, shopping? N N  Preparing Food and eating ? N -  Using the Toilet? N -  In the past six months, have you accidently leaked urine? N -  Do you have problems with loss of bowel control? N -  Managing your Medications? N -  Managing your Finances? N -  Housekeeping or  managing your Housekeeping? N -  Some recent data might be hidden    Patient Care Team: Gayland Curry, DO as PCP - General (Geriatric Medicine) Tania Ade, MD as Consulting Physician (Orthopedic Surgery) Nahser, Wonda Cheng, MD as Consulting Physician (Cardiology) Darleen Crocker, MD as Consulting Physician (Ophthalmology) Irine Seal, MD as Attending Physician (Urology) Dorna Leitz, MD as Consulting Physician (Orthopedic Surgery) Tanda Rockers, MD as Consulting Physician (Pulmonary Disease)    Assessment:   This is a routine wellness examination for Samantha Stone.  Exercise Activities and Dietary recommendations Current Exercise Habits: Structured exercise class;Home exercise routine, Type of exercise: calisthenics;stretching, Time (Minutes): 60, Frequency (Times/Week): 2, Weekly Exercise (Minutes/Week): 120, Intensity: Moderate  Goals    . Walk More for exercise       Fall Risk Fall Risk  06/04/2019 02/18/2019 10/15/2018  Falls in the past year? 1 1 0  Number falls in past yr: 0 0 0  Comment Tripped - -  Injury with Fall? 0 1 0   Is the patient's home free of loose throw rugs in walkways, pet beds, electrical cords, etc?   yes      Grab bars in the bathroom?  yes      Handrails on the stairs?   no stairs       Adequate lighting?   yes  Timed Get Up and Go performed: na  Depression Screen PHQ 2/9 Scores 06/04/2019 02/18/2019 10/15/2018  PHQ - 2 Score 0 0 0     Cognitive Function     6CIT Screen 06/04/2019  What Year? 0 points  What month? 0 points  What time? 0 points  Count back from 20 0 points  Months in reverse 0 points  Repeat phrase 0 points  Total Score 0    Immunization History  Administered Date(s) Administered  . Influenza Whole 03/30/2016  . Influenza, High Dose Seasonal PF 04/17/2018, 03/28/2019  . Pneumococcal Conjugate-13 02/28/2015  . Pneumococcal Polysaccharide-23 07/30/1996  . Td 02/28/2011  . Zoster 08/30/2006  . Zoster Recombinat (Shingrix)  03/31/2019    Qualifies for Shingles Vaccine?yes, needs to get 2nd shot.   Screening Tests Health Maintenance  Topic Date Due  . DEXA SCAN  02/19/1998  . PNA vac Low Risk Adult (2 of 2 - PPSV23) 02/28/2016  . TETANUS/TDAP  02/27/2021  . INFLUENZA VACCINE  Completed    Cancer Screenings: Lung: Low Dose CT Chest recommended if Age 42-80 years, 30 pack-year currently smoking OR have quit w/in 15years. Patient does not qualify. Breast:  Up to date on Mammogram? No   Up to date of Bone Density/Dexa? Yes Colorectal: aged out  Additional Screenings:  Hepatitis C Screening: aged out     Plan:      I have personally reviewed and noted the following in the patient's chart:   . Medical and social history . Use of alcohol, tobacco or illicit drugs  . Current medications and supplements . Functional ability and status . Nutritional status . Physical activity . Advanced directives . List of other physicians . Hospitalizations, surgeries, and ER visits in previous 12 months . Vitals . Screenings to include cognitive, depression, and falls . Referrals and appointments  In addition, I have reviewed and discussed with patient certain preventive protocols, quality metrics, and best practice recommendations. A written personalized care plan for preventive services as well as general preventive health recommendations were provided to patient.     Lauree Chandler, NP  06/04/2019

## 2019-06-04 NOTE — Progress Notes (Signed)
This service is provided via telemedicine  No vital signs collected/recorded due to the encounter was a telemedicine visit.   Location of patient (ex: home, work):  Home  Patient consents to a telephone visit:  Yes  Location of the provider (ex: office, home):  [facility]  Name of any referring provider:  Dr. Hollace Kinnier  Names of all persons participating in the telemedicine service and their role in the encounter:  Bonney Leitz, [place]; Sherrie Mustache, NP; patient.   Time spent on call:  13.9 minutes  CMA time only

## 2019-06-04 NOTE — Patient Instructions (Signed)
Samantha Stone , Thank you for taking time to come for your Medicare Wellness Visit. I appreciate your ongoing commitment to your health goals. Please review the following plan we discussed and let me know if I can assist you in the future.   Screening recommendations/referrals: Colonoscopy aged out Mammogram aged out Bone Density ordered today Recommended yearly ophthalmology/optometry visit for glaucoma screening and checkup Recommended yearly dental visit for hygiene and checkup  Vaccinations: Influenza vaccine - up to date Pneumococcal vaccine up to date Tdap vaccine up to date Shingles vaccine to get 2nd vaccine at pharmacy    Advanced directives: on file  Conditions/risks identified: obesity and complications occurring from increase BMI, encouraged weight loss with diet and increase and exercise.   Next appointment: 1 year   Preventive Care 68 Years and Older, Female Preventive care refers to lifestyle choices and visits with your health care provider that can promote health and wellness. What does preventive care include?  A yearly physical exam. This is also called an annual well check.  Dental exams once or twice a year.  Routine eye exams. Ask your health care provider how often you should have your eyes checked.  Personal lifestyle choices, including:  Daily care of your teeth and gums.  Regular physical activity.  Eating a healthy diet.  Avoiding tobacco and drug use.  Limiting alcohol use.  Practicing safe sex.  Taking low-dose aspirin every day.  Taking vitamin and mineral supplements as recommended by your health care provider. What happens during an annual well check? The services and screenings done by your health care provider during your annual well check will depend on your age, overall health, lifestyle risk factors, and family history of disease. Counseling  Your health care provider may ask you questions about your:  Alcohol use.  Tobacco  use.  Drug use.  Emotional well-being.  Home and relationship well-being.  Sexual activity.  Eating habits.  History of falls.  Memory and ability to understand (cognition).  Work and work Statistician.  Reproductive health. Screening  You may have the following tests or measurements:  Height, weight, and BMI.  Blood pressure.  Lipid and cholesterol levels. These may be checked every 5 years, or more frequently if you are over 63 years old.  Skin check.  Lung cancer screening. You may have this screening every year starting at age 4 if you have a 30-pack-year history of smoking and currently smoke or have quit within the past 15 years.  Fecal occult blood test (FOBT) of the stool. You may have this test every year starting at age 38.  Flexible sigmoidoscopy or colonoscopy. You may have a sigmoidoscopy every 5 years or a colonoscopy every 10 years starting at age 61.  Hepatitis C blood test.  Hepatitis B blood test.  Sexually transmitted disease (STD) testing.  Diabetes screening. This is done by checking your blood sugar (glucose) after you have not eaten for a while (fasting). You may have this done every 1-3 years.  Bone density scan. This is done to screen for osteoporosis. You may have this done starting at age 85.  Mammogram. This may be done every 1-2 years. Talk to your health care provider about how often you should have regular mammograms. Talk with your health care provider about your test results, treatment options, and if necessary, the need for more tests. Vaccines  Your health care provider may recommend certain vaccines, such as:  Influenza vaccine. This is recommended every year.  Tetanus,  diphtheria, and acellular pertussis (Tdap, Td) vaccine. You may need a Td booster every 10 years.  Zoster vaccine. You may need this after age 32.  Pneumococcal 13-valent conjugate (PCV13) vaccine. One dose is recommended after age 12.  Pneumococcal  polysaccharide (PPSV23) vaccine. One dose is recommended after age 45. Talk to your health care provider about which screenings and vaccines you need and how often you need them. This information is not intended to replace advice given to you by your health care provider. Make sure you discuss any questions you have with your health care provider. Document Released: 08/12/2015 Document Revised: 04/04/2016 Document Reviewed: 05/17/2015 Elsevier Interactive Patient Education  2017 [place] Prevention in the Home Falls can cause injuries. They can happen to people of all ages. There are many things you can do to make your home safe and to help prevent falls. What can I do on the outside of my home?  Regularly fix the edges of walkways and driveways and fix any cracks.  Remove anything that might make you trip as you walk through a door, such as a raised step or threshold.  Trim any bushes or trees on the path to your home.  Use bright outdoor lighting.  Clear any walking paths of anything that might make someone trip, such as rocks or tools.  Regularly check to see if handrails are loose or broken. Make sure that both sides of any steps have handrails.  Any raised decks and porches should have guardrails on the edges.  Have any leaves, snow, or ice cleared regularly.  Use sand or salt on walking paths during winter.  Clean up any spills in your garage right away. This includes oil or grease spills. What can I do in the bathroom?  Use night lights.  Install grab bars by the toilet and in the tub and shower. Do not use towel bars as grab bars.  Use non-skid mats or decals in the tub or shower.  If you need to sit down in the shower, use a plastic, non-slip stool.  Keep the floor dry. Clean up any water that spills on the floor as soon as it happens.  Remove soap buildup in the tub or shower regularly.  Attach bath mats securely with double-sided non-slip rug tape.   Do not have throw rugs and other things on the floor that can make you trip. What can I do in the bedroom?  Use night lights.  Make sure that you have a light by your bed that is easy to reach.  Do not use any sheets or blankets that are too big for your bed. They should not hang down onto the floor.  Have a firm chair that has side arms. You can use this for support while you get dressed.  Do not have throw rugs and other things on the floor that can make you trip. What can I do in the kitchen?  Clean up any spills right away.  Avoid walking on wet floors.  Keep items that you use a lot in easy-to-reach places.  If you need to reach something above you, use a strong step stool that has a grab bar.  Keep electrical cords out of the way.  Do not use floor polish or wax that makes floors slippery. If you must use wax, use non-skid floor wax.  Do not have throw rugs and other things on the floor that can make you trip. What can I do with  my stairs?  Do not leave any items on the stairs.  Make sure that there are handrails on both sides of the stairs and use them. Fix handrails that are broken or loose. Make sure that handrails are as long as the stairways.  Check any carpeting to make sure that it is firmly attached to the stairs. Fix any carpet that is loose or worn.  Avoid having throw rugs at the top or bottom of the stairs. If you do have throw rugs, attach them to the floor with carpet tape.  Make sure that you have a light switch at the top of the stairs and the bottom of the stairs. If you do not have them, ask someone to add them for you. What else can I do to help prevent falls?  Wear shoes that:  Do not have high heels.  Have rubber bottoms.  Are comfortable and fit you well.  Are closed at the toe. Do not wear sandals.  If you use a stepladder:  Make sure that it is fully opened. Do not climb a closed stepladder.  Make sure that both sides of the  stepladder are locked into place.  Ask someone to hold it for you, if possible.  Clearly mark and make sure that you can see:  Any grab bars or handrails.  First and last steps.  Where the edge of each step is.  Use tools that help you move around (mobility aids) if they are needed. These include:  Canes.  Walkers.  Scooters.  Crutches.  Turn on the lights when you go into a dark area. Replace any light bulbs as soon as they burn out.  Set up your furniture so you have a clear path. Avoid moving your furniture around.  If any of your floors are uneven, fix them.  If there are any pets around you, be aware of where they are.  Review your medicines with your doctor. Some medicines can make you feel dizzy. This can increase your chance of falling. Ask your doctor what other things that you can do to help prevent falls. This information is not intended to replace advice given to you by your health care provider. Make sure you discuss any questions you have with your health care provider. Document Released: 05/12/2009 Document Revised: 12/22/2015 Document Reviewed: 08/20/2014 Elsevier Interactive Patient Education  2017 [employer].

## 2019-06-10 ENCOUNTER — Encounter: Payer: Self-pay | Admitting: Internal Medicine

## 2019-06-10 ENCOUNTER — Non-Acute Institutional Stay: Payer: Medicare Other | Admitting: Internal Medicine

## 2019-06-10 ENCOUNTER — Other Ambulatory Visit: Payer: Self-pay

## 2019-06-10 VITALS — BP 148/88 | HR 66 | Temp 97.8°F | Ht 64.0 in | Wt 261.2 lb

## 2019-06-10 DIAGNOSIS — G8929 Other chronic pain: Secondary | ICD-10-CM

## 2019-06-10 DIAGNOSIS — M79671 Pain in right foot: Secondary | ICD-10-CM

## 2019-06-10 DIAGNOSIS — Z96611 Presence of right artificial shoulder joint: Secondary | ICD-10-CM

## 2019-06-10 DIAGNOSIS — M25511 Pain in right shoulder: Secondary | ICD-10-CM

## 2019-06-10 DIAGNOSIS — M19011 Primary osteoarthritis, right shoulder: Secondary | ICD-10-CM

## 2019-06-10 DIAGNOSIS — Z66 Do not resuscitate: Secondary | ICD-10-CM

## 2019-06-10 NOTE — Progress Notes (Signed)
Location:  [facility] clinic Provider: Ladina Shutters L. Mariea Clonts, D.O., C.M.D.  Code Status: DNR Goals of Care:  Advanced Directives 06/04/2019  Does Patient Have a Medical Advance Directive? Yes  Type of Paramedic of [place];Living will  Does patient want to make changes to medical advance directive? No - Patient declined  Copy of [place] in Chart? Yes - validated most recent copy scanned in chart (See row information)     Chief Complaint  Patient presents with  . Medical Management of Chronic Issues    Follow up Right Shoulder Pain. Patient is requesting more PT. Complains Right Foot pain    HPI: Patient is a 85 y.o. female seen today for an acute visit for right shoulder is still bothersome.  She has had right shoulder degenerative arthritis and underwent reverse shoulder arthroplasty.  If she does things like her water aerobics, it hurts.  It's tight when she abducts it.  She'd like to do more therapy.  She worked with Margreta Journey, [place].  Admits she did not keep up with the exercises after her therapy.  It's sore on the outside part of her arm.    Right foot:  It's been bothering her.  It looks normal to her.  When she ges out of be din the am.  It's painful to step on it.  It's painful on the dorsum of it.  Moving the foot side to side and back and forth.    Says she is struggling to get used to 85 years old and saying that.  Her siblings are all older.  Gets worn out after water aerobics.  Has to rest afterwards.  Also does get winded when walking.  Walks her dog multiple times per day.    Has a new grandbaby in [place] that she is eager to meet in person--showed me a photo.    Past Medical History:  Diagnosis Date  . Arthritis   . Cancer ([place])    basal cell skin cancer  . Concussion   . Depression   . Dyspnea    upon exertion  . Headache   . Hypertension   . Hypothyroidism   . Stress incontinence     Past Surgical History:  Procedure  Laterality Date  . ABDOMINAL HYSTERECTOMY    . APPENDECTOMY    . CARPAL TUNNEL RELEASE     rt x2  . COLONOSCOPY    . EYE SURGERY Bilateral    cataract surgery with lens implant  . FINGER AMPUTATION Right    little finger  . I&D EXTREMITY  03/11/2012   Procedure: MINOR IRRIGATION AND DEBRIDEMENT EXTREMITY;  Surgeon: Cammie Sickle., MD;  Location: [facility];  Service: Orthopedics;  Laterality: Right;  Right little finger  . KNEE ARTHROSCOPY     bilat   . REPLACEMENT TOTAL KNEE BILATERAL    . REVERSE SHOULDER ARTHROPLASTY Right 09/04/2018   Procedure: REVERSE SHOULDER ARTHROPLASTY;  Surgeon: Tania Ade, MD;  Location: [facility];  Service: Orthopedics;  Laterality: Right;  . TONSILLECTOMY    . TOTAL KNEE REVISION Right 03/14/2015   Procedure: TIBIAL REVISION, OPEN REDUCTION INTERNAL FIXATION PROXIMAL TIBIA;  Surgeon: Dorna Leitz, MD;  Location: [facility];  Service: Orthopedics;  Laterality: Right;  . TRIGGER FINGER RELEASE    . TUBAL LIGATION      Allergies  Allergen Reactions  . Phenergan [Promethazine Hcl] Other (See Comments)    hallucinations  . Lisinopril Cough  Outpatient Encounter Medications as of 06/10/2019  Medication Sig  . aspirin EC 81 MG tablet Take 81 mg by mouth daily.  . Calcium Carb-Cholecalciferol (CALCIUM 600/VITAMIN D3 PO) Take 1 tablet by mouth daily.  . Cholecalciferol (VITAMIN D-3) 1000 units CAPS Take 1,000 Units by mouth daily.   . ferrous sulfate 325 (65 FE) MG tablet Take 325 mg by mouth daily with breakfast.   . folic acid (FOLVITE) Q000111Q MCG tablet Take 800 mcg by mouth daily.   Marland Kitchen levothyroxine (SYNTHROID) 137 MCG tablet Take 1 tablet (137 mcg total) by mouth daily before breakfast.  . losartan (COZAAR) 100 MG tablet TAKE 1/2 TABLET (50MG ) BY MOUTH EVERY DAY **50 MG TABS BACKORDERED  . metoprolol tartrate (LOPRESSOR) 25 MG tablet TAKE 1 TABLET BY MOUTH TWICE A DAY WITH FOOD  . Multiple Vitamins-Minerals (PRESERVISION AREDS 2 PO) Take 1  capsule by mouth 2 (two) times daily.  . pravastatin (PRAVACHOL) 40 MG tablet Take 1 tablet (40 mg total) by mouth daily.  . traZODone (DESYREL) 50 MG tablet Take 1 tablet (50 mg total) by mouth at bedtime.  . vitamin C (ASCORBIC ACID) 500 MG tablet Take 500 mg by mouth daily.   No facility-administered encounter medications on file as of 06/10/2019.     Review of Systems:  Review of Systems  Constitutional: Negative for chills, fever and malaise/fatigue.  HENT: Negative for congestion and sore throat.   Eyes: Negative for blurred vision.  Respiratory: Positive for shortness of breath. Negative for cough and wheezing.   Cardiovascular: Negative for chest pain, palpitations and leg swelling.  Gastrointestinal: Negative for abdominal pain, diarrhea, nausea and vomiting.  Genitourinary: Negative for dysuria.  Musculoskeletal: Positive for joint pain. Negative for falls.       Right shoulder and also right foot pain  Skin: Negative for itching and rash.  Neurological: Negative for dizziness and loss of consciousness.  Psychiatric/Behavioral: Negative for depression and memory loss. The patient is not nervous/anxious and does not have insomnia.     Health Maintenance  Topic Date Due  . DEXA SCAN  02/19/1998  . PNA vac Low Risk Adult (2 of 2 - PPSV23) 02/28/2016  . TETANUS/TDAP  02/27/2021  . INFLUENZA VACCINE  Completed    Physical Exam: There were no vitals filed for this visit. There is no height or weight on file to calculate BMI. Physical Exam Vitals signs reviewed.  Constitutional:      General: She is not in acute distress.    Appearance: Normal appearance. She is obese. She is not toxic-appearing.  HENT:     Head: Normocephalic and atraumatic.     Ears:     Comments: HOH    Nose:     Comments: Deferred due to covid masking Eyes:     Conjunctiva/sclera: Conjunctivae normal.  Cardiovascular:     Rate and Rhythm: Normal rate and regular rhythm.     Pulses: Normal  pulses.     Heart sounds: Normal heart sounds.  Pulmonary:     Effort: Pulmonary effort is normal.     Breath sounds: Normal breath sounds. No wheezing, rhonchi or rales.  Musculoskeletal:     Comments: Limited abduction and internal rotation of right arm s/p reverse shoulder arthoplasty; some tenderness over biceps region of arm on palpation; note right foot pain when bears weight on medial dorsal foot---no tenderness of pain with wiggling toes or nonweightbearing movement of foot  Skin:    General: Skin is warm and dry.  Neurological:     General: No focal deficit present.     Mental Status: She is alert and oriented to person, place, and time.     Labs reviewed: Basic Metabolic Panel: Recent Labs    08/27/18 1121 02/10/19 02/10/19 0300  NA 138 140  --   K 4.4 4.6  --   CL 103  --   --   CO2 30  --   --   GLUCOSE 104*  --   --   BUN 16 18  --   CREATININE 0.85 0.8  --   CALCIUM 9.3  --   --   TSH  --   --  0.35*   Liver Function Tests: Recent Labs    08/27/18 1121 02/10/19 0300  AST 25 23  ALT 18 14  ALKPHOS 65 78  BILITOT 0.9  --   PROT 6.3*  --   ALBUMIN 3.6  --    No results for input(s): LIPASE, AMYLASE in the last 8760 hours. No results for input(s): AMMONIA in the last 8760 hours. CBC: Recent Labs    08/27/18 1121 02/10/19  WBC 5.2 5.2  NEUTROABS 2.9  --   HGB 13.3 13.5  HCT 41.4 40  MCV 90.2  --   PLT 282 255   Lipid Panel: Recent Labs    02/10/19  CHOL 189  HDL 53  LDLCALC 115  TRIG 103   Lab Results  Component Value Date   HGBA1C 5.5 06/16/2014    Assessment/Plan 1. Primary osteoarthritis of right shoulder -ongoing discomfort -will refer back to OT for more therapy and refresher on exercises  2. S/P reverse total shoulder arthroplasty, right -needs more therapy -continue water aerobics  3. Chronic right shoulder pain -as in 1+2, but pain persists with activity only--resume OT  4. Foot pain, right -using cbd ointment some  -suggested plantarflexion/dorsiflexion exercises before getting out of bed and rolling tennis ball under foot to see if this will loosen fascia if there is a role of plantar fasciitis in her discomfort--if persists or worsens, we may need xray of foot as it may be simply arthritic  5. DNR (do not resuscitate) - DNR (Do Not Resuscitate)--order reentered  Labs/tests ordered:  No new Next appt:  08/19/2019--keep as scheduled for med mgt  Mattias Walmsley L. Braxtyn Bojarski, D.O. [facility] [address], [place] [address] Cell Phone (Mon-Fri 8am-5pm):  [phone] On Call:  [phone] & follow prompts after 5pm & weekends Office Phone:  [phone] Office Fax:  [phone]

## 2019-06-29 ENCOUNTER — Other Ambulatory Visit: Payer: Self-pay | Admitting: Internal Medicine

## 2019-06-29 NOTE — Telephone Encounter (Signed)
High risk or very high risk warning populated when attempting to refill medication. RX request sent to PCP for review and approval if warranted.   

## 2019-07-07 ENCOUNTER — Encounter (INDEPENDENT_AMBULATORY_CARE_PROVIDER_SITE_OTHER): Payer: Self-pay

## 2019-07-07 ENCOUNTER — Encounter: Payer: Self-pay | Admitting: Cardiovascular Disease

## 2019-07-07 ENCOUNTER — Other Ambulatory Visit: Payer: Self-pay

## 2019-07-07 ENCOUNTER — Ambulatory Visit: Payer: Medicare Other | Admitting: Cardiovascular Disease

## 2019-07-07 VITALS — BP 128/78 | HR 64 | Ht 64.0 in | Wt 260.8 lb

## 2019-07-07 DIAGNOSIS — I10 Essential (primary) hypertension: Secondary | ICD-10-CM

## 2019-07-07 DIAGNOSIS — I5032 Chronic diastolic (congestive) heart failure: Secondary | ICD-10-CM | POA: Diagnosis not present

## 2019-07-07 NOTE — Patient Instructions (Signed)
Medication Instructions:  Your physician recommends that you continue on your current medications as directed. Please refer to the Current Medication list given to you today.  *If you need a refill on your cardiac medications before your next appointment, please call your pharmacy*  Lab Work: None Ordered   Testing/Procedures: None Ordered   Follow-Up: At [facility], you and your health needs are our priority.  As part of our continuing mission to provide you with exceptional heart care, we have created designated Provider Care Teams.  These Care Teams include your primary Cardiologist (physician) and Advanced Practice Providers (APPs -  Physician Assistants and Nurse Practitioners) who all work together to provide you with the care you need, when you need it.  Your next appointment:   1 year(s)  The format for your next appointment:   In Person  Provider:   You may see Dr. Acie Fredrickson or one of the following Advanced Practice Providers on your designated Care Team:    Richardson Dopp, PA-C  [place], [place]  Daune Perch, [place]

## 2019-07-07 NOTE — Progress Notes (Signed)
Cardiology Office Note   Date:  07/07/2019   ID:  [name], [name] Jul 05, 1931, MRN [id number]  PCP:  Samantha Curry, DO  Cardiologist:   Samantha Moores, MD   No chief complaint on file.  1. Hypertension 2. Possible TIAs - mental status changes , difficulty talking.  3. Hypothyroidism 4. Hyperlipidemia 5.      Samantha Stone is an 85 yo who I have known for many years. 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   Samantha Stone has fallen several times over the past several months. She slipped on the wet floor on the ceramic tile. She presents for further management of her HTN. Her BP has been irregular at home. Her diet is fairly consistent. She does not eat much salt. She has frozen lasagna on occasion. Hot dog once a month or so.   She was admitted to [facility] in March, 2015 for mental confusion and HTN Echo showed:  Left ventricle: The cavity size was normal. Wall thickness was at the upper limits of normal. Systolic function was normal. The estimated ejection fraction was in the range of 60% to 65%. Wall motion was normal; there were no regional wall motion abnormalities. Doppler parameters are consistent with abnormal left ventricular relaxation (grade 1 diastolic dysfunction). - Mitral valve: Mild regurgitation. - Left atrium: The atrium was moderately to severely dilated. - Pulmonary arteries: Systolic pressure was mildly increased. PA peak pressure: 71mm Hg    Oct. 20, 2015:  Samantha Stone is doing well. She had her cataracts done.  No CP or dyspnea.    Feb. 19, 2016:   Samantha Stone is a 85 y.o. female who presents for her HTN and hyperlipidemia. Family issues,  - Samantha Stone was placed in a nursing home.  Step daughter challenged.  Has had a rough 3-4 months.   Has not been exerciseing, diet has not been as strict recently.   Oct. 9, 2017:  Samantha Stone is seen today for follow up visit Doing well Has had a chronic cough.   Was diagnosed with CHF - she had an  echo in 2015 that showed normal LV systolic function and grade 1 diastolic dysfunction   Has signficant DOE  - gets DOE walking to her mailbox  She was started on Lasix 10 mg a day but has not noticed any increase in her urine output.  Jan. 24, 2018:  Still has a cough.    Is now on Lasix 60 mg a day  Has been to ENT  Going to see Dr. Melvyn Novas soon .  BP has been elevated.   Oct. 23, 2018: Samantha Stone is seen today for follow up of her chronic diastolic congestive heart failure Still has a chronic cough.  Slightly better recently  Has low stamina  Doing better on Lasix 20 mg a day instead of 40 mg   June 10, 2018: Samantha Stone  is seen today for follow-up of her chronic diastolic congestive heart failure. She now lives at [facility] Walks , wears her Fitbit.     Wt today is 264 lbs.  Gets fatigued  Had a cough for years but now it has resolved.  BP has been normal recently . Is a bit high today   July 07, 2019:  Samantha Stone  is seen today for follow-up visit.   Has some DOE walking from her car to her apt.  Admits that she is out of shape and weighs too much .   Past Medical History:  Diagnosis Date  .  Arthritis   . Cancer ([place])    basal cell skin cancer  . Concussion   . Depression   . Dyspnea    upon exertion  . Headache   . Hypertension   . Hypothyroidism   . Stress incontinence     Past Surgical History:  Procedure Laterality Date  . ABDOMINAL HYSTERECTOMY    . APPENDECTOMY    . CARPAL TUNNEL RELEASE     rt x2  . COLONOSCOPY    . EYE SURGERY Bilateral    cataract surgery with lens implant  . FINGER AMPUTATION Right    little finger  . I&D EXTREMITY  03/11/2012   Procedure: MINOR IRRIGATION AND DEBRIDEMENT EXTREMITY;  Surgeon: Cammie Sickle., MD;  Location: [place];  Service: Orthopedics;  Laterality: Right;  Right little finger  . KNEE ARTHROSCOPY     bilat   . REPLACEMENT TOTAL KNEE BILATERAL    . REVERSE SHOULDER ARTHROPLASTY Right 09/04/2018    Procedure: REVERSE SHOULDER ARTHROPLASTY;  Surgeon: Tania Ade, MD;  Location: [facility];  Service: Orthopedics;  Laterality: Right;  . TONSILLECTOMY    . TOTAL KNEE REVISION Right 03/14/2015   Procedure: TIBIAL REVISION, OPEN REDUCTION INTERNAL FIXATION PROXIMAL TIBIA;  Surgeon: Dorna Leitz, MD;  Location: [place] [facility];  Service: Orthopedics;  Laterality: Right;  . TRIGGER FINGER RELEASE    . TUBAL LIGATION       Current Outpatient Medications  Medication Sig Dispense Refill  . aspirin EC 81 MG tablet Take 81 mg by mouth daily.    . Calcium Carb-Cholecalciferol (CALCIUM 600/VITAMIN D3 PO) Take 1 tablet by mouth daily.    . Cholecalciferol (VITAMIN D-3) 1000 units CAPS Take 1,000 Units by mouth daily.     . ferrous sulfate 325 (65 FE) MG tablet Take 325 mg by mouth daily with breakfast.     . folic acid (FOLVITE) Q000111Q MCG tablet Take 800 mcg by mouth daily.     Marland Kitchen levothyroxine (SYNTHROID) 137 MCG tablet Take 1 tablet (137 mcg total) by mouth daily before breakfast. 90 tablet 1  . losartan (COZAAR) 100 MG tablet TAKE 1/2 TABLET (50MG ) BY MOUTH EVERY DAY **50 MG TABS BACKORDERED 45 tablet 1  . metoprolol tartrate (LOPRESSOR) 25 MG tablet TAKE 1 TABLET BY MOUTH TWICE A DAY WITH FOOD 180 tablet 1  . Multiple Vitamins-Minerals (PRESERVISION AREDS 2 PO) Take 1 capsule by mouth 2 (two) times daily.    . pravastatin (PRAVACHOL) 40 MG tablet Take 1 tablet (40 mg total) by mouth daily. 90 tablet 3  . traZODone (DESYREL) 50 MG tablet Take 1 tablet (50 mg total) by mouth at bedtime. 90 tablet 1  . vitamin C (ASCORBIC ACID) 500 MG tablet Take 500 mg by mouth daily.     No current facility-administered medications for this visit.     Allergies:   Phenergan [promethazine hcl] and Lisinopril    Social History:  The patient  reports that she has never smoked. She has never used smokeless tobacco. She reports current alcohol use of about 14.0 standard drinks of alcohol per week. She reports that she does not  use drugs.   Family History:  The patient's family history includes Bladder Cancer in her father.    ROS:  Please see the history of present illness.       Physical Exam: Blood pressure 128/78, pulse 64, height 5\' 4"  (1.626 m), weight 260 lb 12.8 oz (118.3 kg).  GEN:  Elderly female, moderately  obese  HEENT: Normal NECK: No JVD; No carotid bruits LYMPHATICS: No lymphadenopathy CARDIAC: RRR , soft systolic murmur  RESPIRATORY:  Clear to auscultation without rales, wheezing or rhonchi  ABDOMEN: Soft, non-tender, non-distended MUSCULOSKELETAL:  No edema; No deformity  SKIN: Warm and dry NEUROLOGIC:  Alert and oriented x 3   EKG:    July 07, 2019: Normal sinus rhythm at 64.  T wave inversions in lead aVL.  No changes from previous EKG.   Recent Labs: 02/10/2019: ALT 14; BUN 18; Creatinine 0.8; Hemoglobin 13.5; Platelets 255; Potassium 4.6; Sodium 140; TSH 0.35    Lipid Panel    Component Value Date/Time   CHOL 189 02/10/2019   CHOL 191 05/21/2017 1128   TRIG 103 02/10/2019   HDL 53 02/10/2019   HDL 48 05/21/2017 1128   CHOLHDL 4.0 05/21/2017 1128   CHOLHDL 2.8 06/16/2014 0429   VLDL 12 06/16/2014 0429   LDLCALC 115 02/10/2019   LDLCALC 105 (H) 05/21/2017 1128      Wt Readings from Last 3 Encounters:  07/07/19 260 lb 12.8 oz (118.3 kg)  06/10/19 261 lb 3.2 oz (118.5 kg)  06/04/19 260 lb (117.9 kg)      Other studies Reviewed: Additional studies/ records that were reviewed today include: . Review of the above records demonstrates:    ASSESSMENT AND PLAN:  1. Hypertension -   BP is well controlled.    2. Possible TIAs -  Stable from a neuro standpoint   3. Hypothyroidism -    4. Hyperlipidemia -    Labs look ok  Cont  meds   5.  DOE, cough / chronic diastolic CHF- Has some DOE. Admits to not exercising much at all Also comments that she needs to lose some weight  I think both of these are contributing to her DOE  Advised cutting back on her food  and increasing her exercise   Return in 1 year     Current medicines are reviewed at length with the patient today.  The patient does not have concerns regarding medicines.  The following changes have been made:  no change     Signed, Samantha Moores, MD  07/07/2019 11:40 AM    [address] [place], [place], [place]  [address] Phone: [phone]; Fax: [phone]

## 2019-08-11 LAB — BASIC METABOLIC PANEL
BUN: 16 (ref 4–21)
CO2: 27 — AB (ref 13–22)
Chloride: 102 (ref 99–108)
Creatinine: 0.9 (ref 0.5–1.1)
Glucose: 103
Potassium: 4.1 (ref 3.4–5.3)
Sodium: 141 (ref 137–147)

## 2019-08-11 LAB — CBC AND DIFFERENTIAL
HCT: 39 (ref 36–46)
Hemoglobin: 13.4 (ref 12.0–16.0)
Platelets: 251 (ref 150–399)
WBC: 5.3

## 2019-08-11 LAB — HEPATIC FUNCTION PANEL: Bilirubin, Total: 0.2

## 2019-08-11 LAB — LIPID PANEL
HDL: 49 (ref 35–70)
LDL Cholesterol: 88
Triglycerides: 123 (ref 40–160)

## 2019-08-11 LAB — COMPREHENSIVE METABOLIC PANEL
Albumin: 3.9 (ref 3.5–5.0)
Calcium: 9.4 (ref 8.7–10.7)

## 2019-08-11 LAB — CBC: RBC: 4.51 (ref 3.87–5.11)

## 2019-08-11 LAB — TSH: TSH: 0.08 — AB (ref 0.41–5.90)

## 2019-08-13 ENCOUNTER — Encounter: Payer: Self-pay | Admitting: Internal Medicine

## 2019-08-19 ENCOUNTER — Encounter: Payer: Self-pay | Admitting: Internal Medicine

## 2019-08-19 ENCOUNTER — Telehealth: Payer: Self-pay | Admitting: Internal Medicine

## 2019-08-19 ENCOUNTER — Non-Acute Institutional Stay: Payer: Medicare Other | Admitting: Internal Medicine

## 2019-08-19 ENCOUNTER — Other Ambulatory Visit: Payer: Self-pay

## 2019-08-19 VITALS — BP 128/78 | HR 75 | Temp 97.7°F | Ht 64.0 in | Wt 257.8 lb

## 2019-08-19 DIAGNOSIS — I10 Essential (primary) hypertension: Secondary | ICD-10-CM

## 2019-08-19 DIAGNOSIS — E039 Hypothyroidism, unspecified: Secondary | ICD-10-CM | POA: Diagnosis not present

## 2019-08-19 DIAGNOSIS — I5032 Chronic diastolic (congestive) heart failure: Secondary | ICD-10-CM | POA: Diagnosis not present

## 2019-08-19 DIAGNOSIS — M19079 Primary osteoarthritis, unspecified ankle and foot: Secondary | ICD-10-CM

## 2019-08-19 DIAGNOSIS — J45991 Cough variant asthma: Secondary | ICD-10-CM | POA: Diagnosis not present

## 2019-08-19 DIAGNOSIS — Z83518 Family history of other specified eye disorder: Secondary | ICD-10-CM

## 2019-08-19 MED ORDER — LEVOTHYROXINE SODIUM 125 MCG PO TABS: 137.0000 ug | ORAL_TABLET | Freq: Every day | ORAL | 3 refills | Status: DC

## 2019-08-19 NOTE — Telephone Encounter (Signed)
As I was finishing Ms. Deterding' note from this morning, I realized her TSH was low meaning she's getting too much levothyroxine at the 137 mcg.  I sent a reduced dose of 122mcg daily to her pharmacy.  Please let her know.

## 2019-08-19 NOTE — Progress Notes (Signed)
Location:  Occupational psychologist of Service:  Clinic (12)  Provider: Stashia Sia L. Mariea Clonts, D.O., C.M.D.  Code Status: DNR Goals of Care:  Advanced Directives 08/19/2019  Does Patient Have a Medical Advance Directive? Yes  Type of Advance Directive Out of facility DNR (pink MOST or yellow form)  Does patient want to make changes to medical advance directive? No - Patient declined  Copy of [place] in Chart? -   Chief Complaint  Patient presents with  . Medical Management of Chronic Issues    6 month follow up , big toe on rt foot     HPI: Patient is a 86 y.o. female seen today for medical management of chronic diseases.    Big toe on right foot will hurt when goes to bed.  Nearly nightly.  Would rather walk in bare feet than wear shoes.    Says she might be bored enough that she has nothing else to think about it.  She's read 6 books.  She's had her first covid vaccine.  Cholesterol did improve with med change to LDL 88 was 115 before.    Had her checkup with Dr. Acie Fredrickson.  She did not have any signs of chf.  She is aware that her weight also contributes to her shortness of breath.  She has her chronic cough also of unclear etiology.  She attributes it to postnasal drip.  Past Medical History:  Diagnosis Date  . Arthritis   . Cancer ([place])    basal cell skin cancer  . Concussion   . Depression   . Dyspnea    upon exertion  . Headache   . Hypertension   . Hypothyroidism   . Stress incontinence     Past Surgical History:  Procedure Laterality Date  . ABDOMINAL HYSTERECTOMY    . APPENDECTOMY    . CARPAL TUNNEL RELEASE     rt x2  . COLONOSCOPY    . EYE SURGERY Bilateral    cataract surgery with lens implant  . FINGER AMPUTATION Right    little finger  . I & D EXTREMITY  03/11/2012   Procedure: MINOR IRRIGATION AND DEBRIDEMENT EXTREMITY;  Surgeon: Cammie Sickle., MD;  Location: [facility];  Service: Orthopedics;   Laterality: Right;  Right little finger  . KNEE ARTHROSCOPY     bilat   . REPLACEMENT TOTAL KNEE BILATERAL    . REVERSE SHOULDER ARTHROPLASTY Right 09/04/2018   Procedure: REVERSE SHOULDER ARTHROPLASTY;  Surgeon: Tania Ade, MD;  Location: [facility];  Service: Orthopedics;  Laterality: Right;  . TONSILLECTOMY    . TOTAL KNEE REVISION Right 03/14/2015   Procedure: TIBIAL REVISION, OPEN REDUCTION INTERNAL FIXATION PROXIMAL TIBIA;  Surgeon: Dorna Leitz, MD;  Location: [facility];  Service: Orthopedics;  Laterality: Right;  . TRIGGER FINGER RELEASE    . TUBAL LIGATION      Allergies  Allergen Reactions  . Phenergan [Promethazine Hcl] Other (See Comments)    hallucinations  . Lisinopril Cough    Outpatient Encounter Medications as of 08/19/2019  Medication Sig  . aspirin EC 81 MG tablet Take 81 mg by mouth daily.  . Calcium Carb-Cholecalciferol (CALCIUM 600/VITAMIN D3 PO) Take 1 tablet by mouth daily.  . Cholecalciferol (VITAMIN D-3) 1000 units CAPS Take 1,000 Units by mouth daily.   . ferrous sulfate 325 (65 FE) MG tablet Take 325 mg by mouth daily with breakfast.   . folic acid (FOLVITE) Q000111Q MCG  tablet Take 800 mcg by mouth daily.   Marland Kitchen levothyroxine (SYNTHROID) 137 MCG tablet Take 1 tablet (137 mcg total) by mouth daily before breakfast.  . losartan (COZAAR) 100 MG tablet TAKE 1/2 TABLET (50MG ) BY MOUTH EVERY DAY **50 MG TABS BACKORDERED  . metoprolol tartrate (LOPRESSOR) 25 MG tablet TAKE 1 TABLET BY MOUTH TWICE A DAY WITH FOOD  . Multiple Vitamins-Minerals (PRESERVISION AREDS 2 PO) Take 1 capsule by mouth 2 (two) times daily.  . pravastatin (PRAVACHOL) 40 MG tablet Take 1 tablet (40 mg total) by mouth daily.  . traZODone (DESYREL) 50 MG tablet Take 1 tablet (50 mg total) by mouth at bedtime.  . vitamin C (ASCORBIC ACID) 500 MG tablet Take 500 mg by mouth daily.   No facility-administered encounter medications on file as of 08/19/2019.    Review of Systems:  Review of Systems    Constitutional: Negative for chills, fever and malaise/fatigue.  Eyes: Negative for blurred vision.  Cardiovascular: Negative for chest pain, palpitations and leg swelling.  Skin: Negative for itching and rash.    Health Maintenance  Topic Date Due  . DEXA SCAN  02/19/1998  . PNA vac Low Risk Adult (2 of 2 - PPSV23) 02/28/2016  . TETANUS/TDAP  02/27/2021  . INFLUENZA VACCINE  Completed    Physical Exam: Vitals:   08/19/19 1121  BP: 128/78  Pulse: 75  Temp: 97.7 F (36.5 C)  TempSrc: Temporal  SpO2: 97%  Weight: 257 lb 12.8 oz (116.9 kg)  Height: 5\' 4"  (1.626 m)   Body mass index is 44.25 kg/m. Physical Exam Vitals reviewed.  Constitutional:      General: She is not in acute distress.    Appearance: Normal appearance. She is obese. She is not ill-appearing or toxic-appearing.  HENT:     Head: Normocephalic and atraumatic.  Eyes:     Comments: glasses  Cardiovascular:     Rate and Rhythm: Normal rate and regular rhythm.     Pulses: Normal pulses.     Heart sounds: Normal heart sounds.  Pulmonary:     Effort: Pulmonary effort is normal.     Breath sounds: Normal breath sounds. No rales.  Abdominal:     General: Bowel sounds are normal.  Musculoskeletal:        General: Normal range of motion.     Right lower leg: No edema.     Left lower leg: No edema.     Comments: Right great toe without erythema or warmth or swelling, not currently tender but reports it normally will be in the first toe joint, good ROM, does have some medial diversion of second toe but that is not culprit per pt  Skin:    General: Skin is warm and dry.  Neurological:     General: No focal deficit present.     Mental Status: She is alert and oriented to person, place, and time.     Cranial Nerves: No cranial nerve deficit.  Psychiatric:        Mood and Affect: Mood normal.        Behavior: Behavior normal.        Thought Content: Thought content normal.        Judgment: Judgment normal.      Labs reviewed: Basic Metabolic Panel: Recent Labs    08/27/18 1121 02/10/19 0000 02/10/19 0300 08/11/19 0500  NA 138 140  --  141  K 4.4 4.6  --  4.1  CL 103  --   --  102  CO2 30  --   --  27*  GLUCOSE 104*  --   --   --   BUN 16 18  --  16  CREATININE 0.85 0.8  --  0.9  CALCIUM 9.3  --   --  9.4  TSH  --   --  0.35* 0.08*   Liver Function Tests: Recent Labs    08/27/18 1121 02/10/19 0300 08/11/19 0500  AST 25 23  --   ALT 18 14  --   ALKPHOS 65 78  --   BILITOT 0.9  --   --   PROT 6.3*  --   --   ALBUMIN 3.6  --  3.9   No results for input(s): LIPASE, AMYLASE in the last 8760 hours. No results for input(s): AMMONIA in the last 8760 hours. CBC: Recent Labs    08/27/18 1121 02/10/19 0000 08/11/19 0500  WBC 5.2 5.2 5.3  NEUTROABS 2.9  --   --   HGB 13.3 13.5 13.4  HCT 41.4 40 39  MCV 90.2  --   --   PLT 282 255 251   Lipid Panel: Recent Labs    02/10/19 0000 08/11/19 0500  CHOL 189  --   HDL 53 49  LDLCALC 115 88  TRIG 103 123   Lab Results  Component Value Date   HGBA1C 5.5 06/16/2014    Assessment/Plan 1. Chronic diastolic congestive heart failure (HCC) -grade 1 per echo, no s/s of chf at this time, deconditioning and obesity contribute to some dyspnea--hopefully soon can get back to swimming when fully vaccinated -followed by cardiology  2. Essential hypertension -bp at goal with current therapy, cont same and monitor  3. Cough variant asthma -per Dr. Melvyn Novas -not on any asthma tx -maintains some cough  4. Hypothyroidism, unspecified type -will reduce levothyroxine to 110mcg daily and recheck in 6 weeks Lab Results  Component Value Date   TSH 0.08 (A) 08/11/2019    5. Family history of macular degeneration -continues to take preservision areds2 for primary prevention   6. Osteoarthritis of toe -no signs of gouty arthritis  -advised to use tylenol and/or topical nsaid for pain  Labs/tests ordered:  Cbc, bmp, flp  before Next appt:  6 mos med mgt, fasting labs before  Kayren Holck L. Nneka Blanda, D.O. [facility] [address], [place] [address] Cell Phone (Mon-Fri 8am-5pm):  [phone] On Call:  [phone] & follow prompts after 5pm & weekends Office Phone:  [phone] Office Fax:  [phone]

## 2019-09-01 ENCOUNTER — Other Ambulatory Visit: Payer: Self-pay

## 2019-09-01 ENCOUNTER — Ambulatory Visit
Admission: RE | Admit: 2019-09-01 | Discharge: 2019-09-01 | Disposition: A | Discharge status: Home / Self Care | Payer: Medicare Other | Source: Ambulatory Visit | Attending: Nurse Practitioner | Admitting: Nurse Practitioner

## 2019-09-01 DIAGNOSIS — E2839 Other primary ovarian failure: Secondary | ICD-10-CM

## 2019-09-01 NOTE — Progress Notes (Signed)
Good news!  Bone density was normal.

## 2019-09-02 ENCOUNTER — Encounter: Payer: Self-pay | Admitting: *Deleted

## 2019-10-22 DIAGNOSIS — H353132 Nonexudative age-related macular degeneration, bilateral, intermediate dry stage: Secondary | ICD-10-CM | POA: Diagnosis not present

## 2019-11-03 ENCOUNTER — Other Ambulatory Visit: Payer: Self-pay | Admitting: Internal Medicine

## 2019-11-11 ENCOUNTER — Non-Acute Institutional Stay: Payer: Medicare Other | Admitting: Internal Medicine

## 2019-11-11 ENCOUNTER — Other Ambulatory Visit: Payer: Self-pay

## 2019-11-11 ENCOUNTER — Encounter: Payer: Self-pay | Admitting: Internal Medicine

## 2019-11-11 VITALS — BP 126/72 | HR 81 | Temp 97.5°F | Ht 64.0 in | Wt 254.5 lb

## 2019-11-11 DIAGNOSIS — M546 Pain in thoracic spine: Secondary | ICD-10-CM

## 2019-11-11 DIAGNOSIS — K6289 Other specified diseases of anus and rectum: Secondary | ICD-10-CM

## 2019-11-11 DIAGNOSIS — M25511 Pain in right shoulder: Secondary | ICD-10-CM | POA: Diagnosis not present

## 2019-11-11 DIAGNOSIS — R194 Change in bowel habit: Secondary | ICD-10-CM

## 2019-11-11 DIAGNOSIS — R630 Anorexia: Secondary | ICD-10-CM

## 2019-11-11 NOTE — Progress Notes (Signed)
Location:  [place] of Service:  Clinic (12)  Provider: Erlin Gardella L. Mariea Clonts, D.O., C.M.D.  Code Status: DNR Goals of Care:  Advanced Directives 11/11/2019  Does Patient Have a Medical Advance Directive? Yes  Type of Advance Directive [place]  Does patient want to make changes to medical advance directive? No - Patient declined  Copy of [place] in Chart? -     Chief Complaint  Patient presents with  . Acute Visit    pain in rectum, diarrhea, constipation     HPI: Patient is a 86 y.o. female seen today for an acute visit for rectal pain and change in bowel habits, loss of appetite.  Down 3.5 lbs. Diarrhea about 1.5 wks ago, then took imodium, then did not have bm for about a week.  Then started to go again after taking 2 doses miralax, she had bms that are kind of loose. "I feel like shit." Has a sharp pain in her rectum like someone is stabbing her there.  That started recently. She is normally regular.   She feels awful with no spunk since then.   No blood seen. She's had no appetite and has hardly eaten anything.   She brought tulips up to a friend when she felt better.  She took her dog for a walk last night.  This morning, her energy is gone. She feels pale.  She had sciatica pain and uses lidocaine patch.    She's gotten where she is wondering what's gonna be a problem this week here lately.  Past Medical History:  Diagnosis Date  . Arthritis   . Cancer ([place])    basal cell skin cancer  . Concussion   . Depression   . Dyspnea    upon exertion  . Headache   . Hypertension   . Hypothyroidism   . Stress incontinence     Past Surgical History:  Procedure Laterality Date  . ABDOMINAL HYSTERECTOMY    . APPENDECTOMY    . CARPAL TUNNEL RELEASE     rt x2  . COLONOSCOPY    . EYE SURGERY Bilateral    cataract surgery with lens implant  . FINGER AMPUTATION Right    little finger  . I & D EXTREMITY  03/11/2012   Procedure: MINOR IRRIGATION AND DEBRIDEMENT EXTREMITY;  Surgeon: Cammie Sickle., MD;  Location: [facility];  Service: Orthopedics;  Laterality: Right;  Right little finger  . KNEE ARTHROSCOPY     bilat   . REPLACEMENT TOTAL KNEE BILATERAL    . REVERSE SHOULDER ARTHROPLASTY Right 09/04/2018   Procedure: REVERSE SHOULDER ARTHROPLASTY;  Surgeon: Tania Ade, MD;  Location: [facility];  Service: Orthopedics;  Laterality: Right;  . TONSILLECTOMY    . TOTAL KNEE REVISION Right 03/14/2015   Procedure: TIBIAL REVISION, OPEN REDUCTION INTERNAL FIXATION PROXIMAL TIBIA;  Surgeon: Dorna Leitz, MD;  Location: [facility];  Service: Orthopedics;  Laterality: Right;  . TRIGGER FINGER RELEASE    . TUBAL LIGATION      Allergies  Allergen Reactions  . Phenergan [Promethazine Hcl] Other (See Comments)    hallucinations  . Lisinopril Cough    Outpatient Encounter Medications as of 11/11/2019  Medication Sig  . aspirin EC 81 MG tablet Take 81 mg by mouth daily.  . Calcium Carb-Cholecalciferol (CALCIUM 600/VITAMIN D3 PO) Take 1 tablet by mouth daily.  . Cholecalciferol (VITAMIN D-3) 1000 units CAPS Take 1,000 Units by mouth daily.   Marland Kitchen  ferrous sulfate 325 (65 FE) MG tablet Take 325 mg by mouth daily with breakfast.   . folic acid (FOLVITE) Q000111Q MCG tablet Take 800 mcg by mouth daily.   Marland Kitchen levothyroxine (SYNTHROID) 125 MCG tablet Take 1 tablet (125 mcg total) by mouth daily before breakfast.  . losartan (COZAAR) 100 MG tablet TAKE 1/2 TABLET (50MG ) BY MOUTH EVERY DAY **50 MG TABS BACKORDERED  . metoprolol tartrate (LOPRESSOR) 25 MG tablet TAKE 1 TABLET BY MOUTH TWICE A DAY WITH FOOD  . Multiple Vitamins-Minerals (PRESERVISION AREDS 2 PO) Take 1 capsule by mouth 2 (two) times daily.  . pravastatin (PRAVACHOL) 40 MG tablet Take 1 tablet (40 mg total) by mouth daily.  . traZODone (DESYREL) 50 MG tablet TAKE 1 TABLET BY MOUTH EVERYDAY AT BEDTIME  . vitamin C (ASCORBIC ACID) 500 MG tablet Take 500 mg by  mouth daily.   No facility-administered encounter medications on file as of 11/11/2019.    Review of Systems:  Review of Systems  Constitutional: Positive for malaise/fatigue. Negative for chills and fever.  Eyes: Negative for blurred vision.  Respiratory: Positive for shortness of breath.   Cardiovascular: Negative for chest pain, palpitations and leg swelling.  Gastrointestinal: Positive for constipation and diarrhea. Negative for blood in stool and melena.  Genitourinary: Negative for dysuria.  Musculoskeletal: Negative for falls and joint pain.  Neurological: Positive for weakness. Negative for dizziness and loss of consciousness.  Endo/Heme/Allergies: Bruises/bleeds easily.  Psychiatric/Behavioral: Negative for depression and memory loss. The patient is not nervous/anxious and does not have insomnia.     Health Maintenance  Topic Date Due  . PNA vac Low Risk Adult (2 of 2 - PPSV23) 02/28/2016  . INFLUENZA VACCINE  02/28/2020  . TETANUS/TDAP  02/27/2021  . DEXA SCAN  Completed    Physical Exam: Vitals:   11/11/19 0836  BP: 126/72  Pulse: 81  Temp: (!) 97.5 F (36.4 C)  TempSrc: Temporal  SpO2: 97%  Weight: 254 lb 8 oz (115.4 kg)  Height: 5\' 4"  (1.626 m)   Body mass index is 43.68 kg/m. Physical Exam Vitals reviewed.  Constitutional:      General: She is not in acute distress.    Appearance: She is obese. She is ill-appearing. She is not toxic-appearing.     Comments: Pale, moving slowly (not her baseline)  HENT:     Head: Normocephalic and atraumatic.  Cardiovascular:     Rate and Rhythm: Normal rate and regular rhythm.  Pulmonary:     Effort: Pulmonary effort is normal.     Breath sounds: Normal breath sounds. No wheezing, rhonchi or rales.  Abdominal:     General: Bowel sounds are normal. There is no distension.     Palpations: Abdomen is soft. There is no mass.     Tenderness: There is no abdominal tenderness. There is no guarding or rebound.    Musculoskeletal:        General: Normal range of motion.  Skin:    General: Skin is warm and dry.     Coloration: Skin is pale.  Neurological:     General: No focal deficit present.     Mental Status: She is alert and oriented to person, place, and time.     Motor: Weakness present.     Comments: Generalized weakness  Psychiatric:        Mood and Affect: Mood normal.        Behavior: Behavior normal.        Thought  Content: Thought content normal.        Judgment: Judgment normal.     Labs reviewed: Basic Metabolic Panel: Recent Labs    02/10/19 0000 02/10/19 0300 08/11/19 0000 08/11/19 0500  NA 140  --   --  141  K 4.6  --   --  4.1  CL  --   --   --  102  CO2  --   --   --  27*  BUN 18  --   --  16  CREATININE 0.8  --   --  0.9  CALCIUM  --   --   --  9.4  TSH  --  0.35* 0.08*  --    Liver Function Tests: Recent Labs    02/10/19 0300 08/11/19 0500  AST 23  --   ALT 14  --   ALKPHOS 78  --   ALBUMIN  --  3.9   No results for input(s): LIPASE, AMYLASE in the last 8760 hours. No results for input(s): AMMONIA in the last 8760 hours. CBC: Recent Labs    02/10/19 0000 08/11/19 0000  WBC 5.2 5.3  HGB 13.5 13.4  HCT 40 39  PLT 255 251   Lipid Panel: Recent Labs    02/10/19 0000 08/11/19 0500  CHOL 189  --   HDL 53 49  LDLCALC 115 88  TRIG 103 123   Lab Results  Component Value Date   HGBA1C 5.5 06/16/2014    Assessment/Plan 1. Rectal pain - cause unclear--I don't see a tear or significant hemorrhoids no did I feel anything on rectal exam, no bleeding -has had loose bms and fecal incontinence which are new - CT Abdomen Pelvis Wo Contrast; Future  2. Change in bowel habits - past 1.5 wks - only current pain is in rectum but did have pain b/w shoulder blades and RUQ concerning for gallbladder, no RUQ pain, has had mucousy stools, but no white stools, no n/v, but loss of appetite - CT Abdomen Pelvis Wo Contrast; Future  3. Poor  appetite -with 3.5 lb weight loss since last seen and intake is primarily breakfast, is pushing fluids  4. Acute midline thoracic back pain -came and went, but suggestive of gallbladder--no exam findings to suggest though  5. Acute pain of right shoulder -has h/o right shoulder rotator cuff, but this was different, also came and went (?passed stone)  Check CT abd/pelvis with oral contrast Labs/tests ordered:  Cbc with diff, bmp, hepatic function, amylase, lipase Next appt:  02/03/2020 and PRN  Tameisha Covell L. Sriman Tally, D.O. [facility] [address], [place] [address] Cell Phone (Mon-Fri 8am-5pm):  [phone] On Call:  [phone] & follow prompts after 5pm & weekends Office Phone:  [phone] Office Fax:  [phone]

## 2019-11-12 DIAGNOSIS — Z79899 Other long term (current) drug therapy: Secondary | ICD-10-CM | POA: Diagnosis not present

## 2019-11-12 DIAGNOSIS — D649 Anemia, unspecified: Secondary | ICD-10-CM | POA: Diagnosis not present

## 2019-11-12 DIAGNOSIS — D62 Acute posthemorrhagic anemia: Secondary | ICD-10-CM | POA: Diagnosis not present

## 2019-11-12 DIAGNOSIS — I5032 Chronic diastolic (congestive) heart failure: Secondary | ICD-10-CM | POA: Diagnosis not present

## 2019-11-12 DIAGNOSIS — I10 Essential (primary) hypertension: Secondary | ICD-10-CM | POA: Diagnosis not present

## 2019-11-12 DIAGNOSIS — E785 Hyperlipidemia, unspecified: Secondary | ICD-10-CM | POA: Diagnosis not present

## 2019-11-12 LAB — CBC: RBC: 4.34 (ref 3.87–5.11)

## 2019-11-12 LAB — HEPATIC FUNCTION PANEL
ALT: 14 (ref 7–35)
AST: 17 (ref 13–35)

## 2019-11-12 LAB — CBC AND DIFFERENTIAL
HCT: 38 (ref 36–46)
Hemoglobin: 12.5 (ref 12.0–16.0)
Platelets: 384 (ref 150–399)
WBC: 7.6

## 2019-11-12 LAB — BASIC METABOLIC PANEL
BUN: 12 (ref 4–21)
CO2: 25 — AB (ref 13–22)
Chloride: 97 — AB (ref 99–108)
Creatinine: 0.8 (ref 0.5–1.1)
Glucose: 104
Potassium: 4.2 (ref 3.4–5.3)
Sodium: 132 — AB (ref 137–147)

## 2019-11-12 LAB — COMPREHENSIVE METABOLIC PANEL
Albumin: 3.6 (ref 3.5–5.0)
Calcium: 9.2 (ref 8.7–10.7)

## 2019-11-17 ENCOUNTER — Encounter (HOSPITAL_COMMUNITY): Payer: Self-pay | Admitting: Emergency Medicine

## 2019-11-17 ENCOUNTER — Ambulatory Visit (INDEPENDENT_AMBULATORY_CARE_PROVIDER_SITE_OTHER): Payer: Medicare Other | Admitting: Family

## 2019-11-17 ENCOUNTER — Encounter: Payer: Self-pay | Admitting: Family

## 2019-11-17 ENCOUNTER — Other Ambulatory Visit: Payer: Self-pay

## 2019-11-17 ENCOUNTER — Encounter: Payer: Self-pay | Admitting: Internal Medicine

## 2019-11-17 ENCOUNTER — Emergency Department (HOSPITAL_COMMUNITY): Payer: Medicare Other

## 2019-11-17 ENCOUNTER — Emergency Department (HOSPITAL_COMMUNITY)
Admission: EM | Admit: 2019-11-17 | Discharge: 2019-11-17 | Disposition: A | Discharge status: Home / Self Care | Payer: Medicare Other | Attending: Emergency Medicine | Admitting: Emergency Medicine

## 2019-11-17 DIAGNOSIS — K6289 Other specified diseases of anus and rectum: Secondary | ICD-10-CM | POA: Diagnosis not present

## 2019-11-17 DIAGNOSIS — K5900 Constipation, unspecified: Secondary | ICD-10-CM | POA: Insufficient documentation

## 2019-11-17 DIAGNOSIS — Z5321 Procedure and treatment not carried out due to patient leaving prior to being seen by health care provider: Secondary | ICD-10-CM | POA: Diagnosis not present

## 2019-11-17 DIAGNOSIS — R630 Anorexia: Secondary | ICD-10-CM | POA: Diagnosis not present

## 2019-11-17 DIAGNOSIS — R109 Unspecified abdominal pain: Secondary | ICD-10-CM | POA: Diagnosis not present

## 2019-11-17 NOTE — ED Notes (Signed)
Pt had labs done on 4/15.

## 2019-11-17 NOTE — Progress Notes (Signed)
Patient ID: Samantha Stone, female   DOB: August 28, 1930, 86 y.o.   MRN: [id number] This service is provided via telemedicine  No vital signs collected/recorded due to the encounter was a telemedicine visit.   Location of patient (ex: home, work):  HOME  Patient consents to a telephone visit:  YES  Location of the provider (ex: office, home):  OFFICE  Name of any referring provider:  TIFFANY REED, DO  Names of all persons participating in the telemedicine service and their role in the encounter:  PATIENT, Edwin Dada, Metcalfe, Marlowe Sax, NP  Time spent on call:  7:46   Provider: Seara Hinesley FNP-C  Gayland Curry, DO  Patient Care Team: Gayland Curry, DO as PCP - General (Geriatric Medicine) Nahser, Wonda Cheng, MD as PCP - Cardiology (Cardiology) Tania Ade, MD as Consulting Physician (Orthopedic Surgery) Nahser, Wonda Cheng, MD as Consulting Physician (Cardiology) Darleen Crocker, MD as Consulting Physician (Ophthalmology) Irine Seal, MD as Attending Physician (Urology) Dorna Leitz, MD as Consulting Physician (Orthopedic Surgery) Tanda Rockers, MD as Consulting Physician (Pulmonary Disease)  Extended Emergency Contact Information Primary Emergency Contact: [facility] Address: [address]          [place], [place] [address] Johnnette Litter of [place] Phone: [phone] Mobile Phone: [phone] Relation: Daughter Secondary Emergency Contact: Hipolito Bayley Address: [place], [place] [address] [place] of [place] Phone: [phone] Mobile Phone: [phone] Relation: Son  Code Status:  DNR Goals of care: Advanced Directive information Advanced Directives 11/11/2019  Does Patient Have a Medical Advance Directive? Yes  Type of Advance Directive [place]  Does patient want to make changes to medical advance directive? No - Patient declined  Copy of [place] in Chart? -     Chief  Complaint  Patient presents with  . Acute Visit    diarrhea    HPI:  Pt is a 86 y.o. female seen today for an acute visit for evaluation of worsening abdominal cramps and mucoid stool.she was seen on 11/11/2019 by PCP Dr.Reed  for rectal pain and change in bowel habits.Abdominal CT scan was ordered.she states still awaiting CT scan.states having worsening abdominal cramps.she has had no appetite but feels like " I'm going to explode".Has had rectal pain and only has small mucus when she tries to have a bowel movement.she states " something need to be done". she denies any nausea or vomiting.States has chills but no fever.    Past Medical History:  Diagnosis Date  . Arthritis   . Cancer ([place])    basal cell skin cancer  . Concussion   . Depression   . Dyspnea    upon exertion  . Headache   . Hypertension   . Hypothyroidism   . Stress incontinence    Past Surgical History:  Procedure Laterality Date  . ABDOMINAL HYSTERECTOMY    . APPENDECTOMY    . CARPAL TUNNEL RELEASE     rt x2  . COLONOSCOPY    . EYE SURGERY Bilateral    cataract surgery with lens implant  . FINGER AMPUTATION Right    little finger  . I & D EXTREMITY  03/11/2012   Procedure: MINOR IRRIGATION AND DEBRIDEMENT EXTREMITY;  Surgeon: Cammie Sickle., MD;  Location: [facility];  Service: Orthopedics;  Laterality: Right;  Right little finger  . KNEE ARTHROSCOPY     bilat   .  REPLACEMENT TOTAL KNEE BILATERAL    . REVERSE SHOULDER ARTHROPLASTY Right 09/04/2018   Procedure: REVERSE SHOULDER ARTHROPLASTY;  Surgeon: Tania Ade, MD;  Location: [facility];  Service: Orthopedics;  Laterality: Right;  . TONSILLECTOMY    . TOTAL KNEE REVISION Right 03/14/2015   Procedure: TIBIAL REVISION, OPEN REDUCTION INTERNAL FIXATION PROXIMAL TIBIA;  Surgeon: Dorna Leitz, MD;  Location: [facility];  Service: Orthopedics;  Laterality: Right;  . TRIGGER FINGER RELEASE    . TUBAL LIGATION      Allergies  Allergen Reactions   . Phenergan [Promethazine Hcl] Other (See Comments)    hallucinations  . Lisinopril Cough    Outpatient Encounter Medications as of 11/17/2019  Medication Sig  . aspirin EC 81 MG tablet Take 81 mg by mouth daily.  . Calcium Carb-Cholecalciferol (CALCIUM 600/VITAMIN D3 PO) Take 1 tablet by mouth daily.  . Cholecalciferol (VITAMIN D-3) 1000 units CAPS Take 1,000 Units by mouth daily.   . ferrous sulfate 325 (65 FE) MG tablet Take 325 mg by mouth daily with breakfast.   . folic acid (FOLVITE) Q000111Q MCG tablet Take 800 mcg by mouth daily.   Marland Kitchen levothyroxine (SYNTHROID) 125 MCG tablet Take 1 tablet (125 mcg total) by mouth daily before breakfast.  . losartan (COZAAR) 100 MG tablet TAKE 1/2 TABLET (50MG ) BY MOUTH EVERY DAY **50 MG TABS BACKORDERED  . metoprolol tartrate (LOPRESSOR) 25 MG tablet TAKE 1 TABLET BY MOUTH TWICE A DAY WITH FOOD  . Multiple Vitamins-Minerals (PRESERVISION AREDS 2 PO) Take 1 capsule by mouth 2 (two) times daily.  . pravastatin (PRAVACHOL) 40 MG tablet Take 1 tablet (40 mg total) by mouth daily.  . traZODone (DESYREL) 50 MG tablet TAKE 1 TABLET BY MOUTH EVERYDAY AT BEDTIME  . vitamin C (ASCORBIC ACID) 500 MG tablet Take 500 mg by mouth daily.   No facility-administered encounter medications on file as of 11/17/2019.    Review of Systems  Constitutional: Positive for appetite change. Negative for chills, fatigue and fever.  Respiratory: Negative for cough, chest tightness, shortness of breath and wheezing.   Cardiovascular: Negative for chest pain, palpitations and leg swelling.  Gastrointestinal: Negative for abdominal distention, abdominal pain, blood in stool, constipation, nausea, rectal pain and vomiting.       Rectal pain/pressure and worsening Abdominal cramps   Neurological: Negative for dizziness and headaches.  Psychiatric/Behavioral: Negative for agitation and confusion. The patient is not nervous/anxious.     Immunization History  Administered Date(s)  Administered  . Influenza Whole 03/30/2016  . Influenza, High Dose Seasonal PF 04/17/2018, 03/28/2019  . Moderna SARS-COVID-2 Vaccination 08/11/2019, 09/08/2019  . Pneumococcal Conjugate-13 02/28/2015  . Pneumococcal Polysaccharide-23 07/30/1996  . Td 02/28/2011  . Zoster 08/30/2006  . Zoster Recombinat (Shingrix) 03/31/2019, 06/12/2019   Pertinent  Health Maintenance Due  Topic Date Due  . PNA vac Low Risk Adult (2 of 2 - PPSV23) 02/28/2016  . INFLUENZA VACCINE  02/28/2020  . DEXA SCAN  Completed   Fall Risk  11/17/2019 11/11/2019 08/19/2019 06/04/2019 02/18/2019  Falls in the past year? 0 - 0 1 1  Number falls in past yr: 0 0 0 0 0  Comment - - - Tripped -  Injury with Fall? 0 0 0 0 1   There were no vitals filed for this visit. There is no height or weight on file to calculate BMI. Physical Exam  Unable to complete on telephone visit.   Labs reviewed: Recent Labs    02/10/19 0000 08/11/19 0500 11/12/19  0300  NA 140 141 132*  K 4.6 4.1 4.2  CL  --  102 97*  CO2  --  27* 25*  BUN 18 16 12   CREATININE 0.8 0.9 0.8  CALCIUM  --  9.4 9.2   Recent Labs    02/10/19 0300 08/11/19 0500 11/12/19 0300  AST 23  --  17  ALT 14  --  14  ALKPHOS 78  --   --   ALBUMIN  --  3.9 3.6   Recent Labs    02/10/19 0000 08/11/19 0000 11/12/19 0300  WBC 5.2 5.3 7.6  HGB 13.5 13.4 12.5  HCT 40 39 38  PLT 255 251 384   Lab Results  Component Value Date   TSH 0.08 (A) 08/11/2019   Lab Results  Component Value Date   HGBA1C 5.5 06/16/2014   Lab Results  Component Value Date   CHOL 189 02/10/2019   HDL 49 08/11/2019   LDLCALC 88 08/11/2019   TRIG 123 08/11/2019   CHOLHDL 4.0 05/21/2017    Significant Diagnostic Results in last 30 days:  No results found.  Assessment/Plan 1. Abdominal cramps Reports worsening abdominal pain with rectal pain and small mucoid stool. Had CT scan ordered 11/11/2019 still awaiting appointment.States feel like " abdomen is going to explode  despite not eating anything.Recommended evaluation in the ED.   2. Rectal pain Has pressure but bowels not moving just mucus.   3. Poor appetite Not eating anything.on going symptoms since last seen by PCP on 11/11/2019 referred to ED for further evaluation.Patient verbalized understanding.   Family/ staff Communication: Reviewed plan of care with patient verbalized understanding.  Labs/tests ordered: Send to ED for further evaluation f worsening abdominal cramps,rectal pain and poor appetite.   Next Appointment: send to ED as above.   Spent 11 minutes of non-face to face with patient    Sandrea Hughs, NP

## 2019-11-17 NOTE — Patient Instructions (Signed)
Please go to ED for further evaluation f worsening abdominal cramps,rectal pain/pressure and poor appetite.

## 2019-11-17 NOTE — ED Triage Notes (Signed)
Pt reports not being able to have a BM in 2 weeks. States she has a mucus discharge when she feels like she needs to have a BM. Pt still passing gas.

## 2019-12-07 ENCOUNTER — Ambulatory Visit
Admission: RE | Admit: 2019-12-07 | Discharge: 2019-12-07 | Disposition: A | Discharge status: Home / Self Care | Payer: Medicare Other | Source: Ambulatory Visit | Attending: Internal Medicine | Admitting: Internal Medicine

## 2019-12-07 ENCOUNTER — Other Ambulatory Visit: Payer: Self-pay | Admitting: Internal Medicine

## 2019-12-07 ENCOUNTER — Other Ambulatory Visit: Payer: Self-pay

## 2019-12-07 DIAGNOSIS — K6289 Other specified diseases of anus and rectum: Secondary | ICD-10-CM

## 2019-12-07 DIAGNOSIS — R630 Anorexia: Secondary | ICD-10-CM

## 2019-12-07 DIAGNOSIS — R194 Change in bowel habit: Secondary | ICD-10-CM

## 2019-12-07 DIAGNOSIS — R109 Unspecified abdominal pain: Secondary | ICD-10-CM

## 2019-12-07 DIAGNOSIS — K8020 Calculus of gallbladder without cholecystitis without obstruction: Secondary | ICD-10-CM | POA: Diagnosis not present

## 2019-12-07 DIAGNOSIS — K5730 Diverticulosis of large intestine without perforation or abscess without bleeding: Secondary | ICD-10-CM | POA: Diagnosis not present

## 2019-12-07 NOTE — Progress Notes (Signed)
Several findings as expected on an 86 yo CT.  Only one relevant to her symptoms is some rectal area colitis which could explain her change in bowel habits and discomfort in that region.  I will refer her to GI for further eval asap.   We can follow-up on the adrenal nodules after I see her again.

## 2019-12-07 NOTE — Telephone Encounter (Signed)
Received ERX from [facility] and sent to Dr. Mariea Clonts for approval due to [place].

## 2019-12-08 ENCOUNTER — Telehealth: Payer: Self-pay

## 2019-12-08 DIAGNOSIS — D1801 Hemangioma of skin and subcutaneous tissue: Secondary | ICD-10-CM | POA: Diagnosis not present

## 2019-12-08 DIAGNOSIS — D225 Melanocytic nevi of trunk: Secondary | ICD-10-CM | POA: Diagnosis not present

## 2019-12-08 DIAGNOSIS — L821 Other seborrheic keratosis: Secondary | ICD-10-CM | POA: Diagnosis not present

## 2019-12-08 DIAGNOSIS — L570 Actinic keratosis: Secondary | ICD-10-CM | POA: Diagnosis not present

## 2019-12-08 DIAGNOSIS — L905 Scar conditions and fibrosis of skin: Secondary | ICD-10-CM | POA: Diagnosis not present

## 2019-12-08 DIAGNOSIS — D485 Neoplasm of uncertain behavior of skin: Secondary | ICD-10-CM | POA: Diagnosis not present

## 2019-12-08 NOTE — Telephone Encounter (Signed)
I wrote out a result for it yesterday for her.  It should be in your results in inbasket.

## 2019-12-08 NOTE — Telephone Encounter (Signed)
I called and spoke with her and she feels better. I gave her her results and she stated that the referral person had already called her but she missed the call and would call back tomorrow.

## 2019-12-08 NOTE — Telephone Encounter (Signed)
Samantha Stone is returning a call, but she does not know from whom.  She had a CT of the Abdomen  on 12/07/19 and is  awaiting her results. Please advise

## 2019-12-09 ENCOUNTER — Other Ambulatory Visit: Payer: Self-pay | Admitting: Internal Medicine

## 2019-12-09 ENCOUNTER — Encounter: Payer: Self-pay | Admitting: Nurse Practitioner

## 2019-12-28 NOTE — Progress Notes (Signed)
12/28/2019 Samantha Stone [id number] 08/15/1930   CHIEF COMPLAINT: constipation   HISTORY OF PRESENT ILLNESS:  Samantha Stone is an 86 year old female with a past medical history of arthritis, depression, hypertension, possible TIAs. diastolic CHF and hypothyroidism.  Past appendectomy, partial hysterectomy, right and left knee total replacement surgery, right shoulder replacement and right rotator cuff repair. She was referred to our by her PCP Dr. Hollace Kinnier for further evaluation regarding a change in bowel pattern. She had diarrhea for a few days and took Imodium. She then developed severe constipation, no bowel movement for 2 weeks. She presented to [facility] ED on 4/20/201. An abdominal xray showed a large amount of stool without evidence of an obstruction. She waited 4 hours without seeing a physician so she left the ED without further evaluation. She took A few teaspoons of Miralax and within the next 1 to 2 days she passes a large amount of loose stool with relief. She had multiple episodes of loose stools, had a few episodes of fecal incontinence. Since then, her stools have floated and one day she described seeing foamy stools. Today, she passed small balls of stool. No recent antibiotics. No NSAID use. No rectal bleeding or melena. She started taking a stool softener a few days ago. She was seen in office by Dr. Mariea Clonts 11/11/2019. At that time she reported having rectal pain with constipation. A rectal exam was reported as normal.   She underwent an abdominal/pelvic CT with oral contrast only on 12/07/2019 which showed cholelithiasis, questionable bilateral adrenal nodules, questionable thickening of the gastric cardia verses artifact from underdistension and questionable mild wall thickening of the rectosigmoid junction with stranding of pericolic fat which was suspicious for colitis or diverticulitis. Few scattered diverticula in the sigmoid colon without evidence of diverticulitis.  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 She has lost 10 to 12 lbs over the past year. She underwent a colonoscopy around the age of 36 which she stated was normal. No family history of IBD or colon cancer. Currently, she denies having any abdominal or rectal pain. She denies having any dysphagia, heartburn or upper abdominal pain. She is taking Ferrous Sulfate 325mg  once daily and folic acid [id number] daily since August 2016. Her Hg level was 8.7 following right knee revision/ORIF 03/14/2020. No history of a GI bleed. Hg 12.5 ( Hg 13.4 on 08/11/2019) and HCT 38 on 11/12/2019.   CBC Latest Ref Rng & Units 11/12/2019 08/11/2019 02/10/2019  WBC - 7.6 5.3 5.2  Hemoglobin 12.0 - 16.0 12.5 13.4 13.5  Hematocrit 36 - 46 38 39 40  Platelets 150 - 399 384 251 255    CMP Latest Ref Rng & Units 11/12/2019 08/11/2019 02/10/2019  Glucose 70 - 99 mg/dL - - -  BUN 4 - 21 12 16 18   Creatinine 0.5 - 1.1 0.8 0.9 0.8  Sodium 137 - 147 132(A) 141 140  Potassium 3.4 - 5.3 4.2 4.1 4.6  Chloride 99 - 108 97(A) 102 -  CO2 13 - 22 25(A) 27(A) -  Calcium 8.7 - 10.7 9.2 9.4 -  Total Protein 6.5 - 8.1 g/dL - - -  Total Bilirubin 0.3 - 1.2 mg/dL - - -  Alkaline Phos 25 - 125 - - 78  AST 13 - 35 17 - 23  ALT 7 - 35 14 - 14  TSH 0.08 on 08/11/2019  Abdominal/Pelvic CT 12/07/2019:  Questionable mild wall thickening at the rectosigmoid junction with stranding  of pericolic fat suspicious for active inflammatory, either colitis or diverticulitis. Minimal sigmoid diverticulosis. Cholelithiasis. Small hiatal hernia with questionable wall thickening of the gastric cardia versus artifact related to underdistention; this could be better evaluated by upper endoscopy or upper GI exam if clinically indicated. Questionable small BILATERAL adrenal nodules, cannot exclude adenomas, recommend follow-up non emergent adrenal protocol CT. 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 Aortic Atherosclerosis.  Abdominal Xray  11/17/2019: Lung bases are clear. Nonobstructed gas pattern with large amount of stool throughout the colon. Right lower quadrant postsurgical changes. Numerous phleboliths in the pelvis. Circular calcification in the left upper quadrant, chronic to 2010. IMPRESSION: Nonobstructed gas pattern with large amount of stool in the colon  ECHO 06/06/2016: - Left ventricle: The cavity size was normal. Wall thickness was  increased in a pattern of mild LVH. There was moderate focal  basal hypertrophy of the septum. Systolic function was normal.  The estimated ejection fraction was in the range of 60% to 65%.  Doppler parameters are consistent with abnormal left ventricular  relaxation (grade 1 diastolic dysfunction).  - Mitral valve: Calcified annulus. Mildly thickened leaflets .  There was mild regurgitation.  - Left atrium: The atrium was moderately dilated.  - Right atrium: The atrium was mildly dilated.  - Pulmonary arteries: PA peak pressure: 38 mm Hg (S).  - Impressions: There is moderate hypertrophy of the basilar septum  with a narrow LVOT but no significant outflow tract gradient or  SAM to suggest HOCM.   Past Medical History:  Diagnosis Date  . Arthritis   . Cancer ([place])    basal cell skin cancer  . Concussion   . Depression   . Dyspnea    upon exertion  . Headache   . Hypertension   . Hypothyroidism   . Stress incontinence    Past Surgical History:  Procedure Laterality Date  . ABDOMINAL HYSTERECTOMY    . APPENDECTOMY    . CARPAL TUNNEL RELEASE     rt x2  . COLONOSCOPY    . EYE SURGERY Bilateral    cataract surgery with lens implant  . FINGER AMPUTATION Right    little finger  . I & D EXTREMITY  03/11/2012   Procedure: MINOR IRRIGATION AND DEBRIDEMENT EXTREMITY;  Surgeon: Cammie Sickle., MD;  Location: [place];  Service: Orthopedics;  Laterality: Right;  Right little finger  . KNEE ARTHROSCOPY     bilat   . REPLACEMENT  TOTAL KNEE BILATERAL    . REVERSE SHOULDER ARTHROPLASTY Right 09/04/2018   Procedure: REVERSE SHOULDER ARTHROPLASTY;  Surgeon: Tania Ade, MD;  Location: [place];  Service: Orthopedics;  Laterality: Right;  . TONSILLECTOMY    . TOTAL KNEE REVISION Right 03/14/2015   Procedure: TIBIAL REVISION, OPEN REDUCTION INTERNAL FIXATION PROXIMAL TIBIA;  Surgeon: Dorna Leitz, MD;  Location: [place];  Service: Orthopedics;  Laterality: Right;  . TRIGGER FINGER RELEASE    . TUBAL LIGATION      Social History: Widowed. Retired Therapist, sports, worked in the [employer] [place]. She drinks 2 beers daily. Nonsmoker. No drug use.   Family History: Mother died 31. Father died 52 bladder cancer. Two sisters ages 17 and 44 and brother 6 all with macular degeneration.  2 sisters one brother.   Allergies  Allergen Reactions  . Phenergan [Promethazine Hcl] Other (See Comments)    hallucinations  . Lisinopril Cough     Outpatient Encounter Medications as of 12/30/2019  Medication Sig  . aspirin  EC 81 MG tablet Take 81 mg by mouth daily.  . Calcium Carb-Cholecalciferol (CALCIUM 600/VITAMIN D3 PO) Take 1 tablet by mouth daily.  . Cholecalciferol (VITAMIN D-3) 1000 units CAPS Take 1,000 Units by mouth daily.   . ferrous sulfate 325 (65 FE) MG tablet Take 325 mg by mouth daily with breakfast.   . folic acid (FOLVITE) [id number] MCG tablet Take 800 mcg by mouth daily.   Marland Kitchen levothyroxine (SYNTHROID) 125 MCG tablet Take 1 tablet (125 mcg total) by mouth daily before breakfast.  . losartan (COZAAR) 100 MG tablet TAKE 1/2 TABLET (50MG ) BY MOUTH EVERY DAY **50 MG TABS BACKORDERED  . metoprolol tartrate (LOPRESSOR) 25 MG tablet TAKE 1 TABLET BY MOUTH TWICE A DAY WITH FOOD  . Multiple Vitamins-Minerals (PRESERVISION AREDS 2 PO) Take 1 capsule by mouth 2 (two) times daily.  . pravastatin (PRAVACHOL) 40 MG tablet Take 1 tablet (40 mg total) by mouth daily.  . traZODone (DESYREL) 50 MG tablet TAKE 1 TABLET BY MOUTH EVERYDAY AT BEDTIME  . vitamin C (ASCORBIC  ACID) 500 MG tablet Take 500 mg by mouth daily.   No facility-administered encounter medications on file as of 12/30/2019.     REVIEW OF SYSTEMS: All other systems reviewed and negative except where noted in the History of Present Illness.   PHYSICAL EXAM: BP 128/64   Pulse 68   Ht 5\' 4"  (1.626 m)   Wt 252 lb 6.4 oz (114.5 kg)   BMI 43.32 kg/m  General: Well developed  86 year old female in no acute distress. Head: Normocephalic and atraumatic. Eyes:  Sclerae non-icteric, conjunctive pink. Ears: Normal auditory acuity. Mouth: Dentition intact. No ulcers or lesions.  Neck: Supple, no lymphadenopathy or thyromegaly.  Lungs: Clear bilaterally to auscultation without wheezes, crackles or rhonchi. Heart: Regular rate and rhythm. No murmur, rub or gallop appreciated.  Abdomen: Soft, nontender, non distended. No masses. No hepatosplenomegaly. Normoactive bowel sounds x 4 quadrants. Large vertical scar RLQ and central lower abdomen.  Rectal: Deferred.  Musculoskeletal: Symmetrical with no gross deformities. Skin: Warm and dry. No rash or lesions on visible extremities. Extremities: No edema. Neurological: Alert oriented x 4, no focal deficits.  Psychological:  Alert and cooperative. Normal mood and affect.  ASSESSMENT AND PLAN:  51. 86 year old female with change in bowel pattern. She initially had diarrhea then severe constipation after taking Imodium which resulted in no BM for 2 weeks. She took Miralax which resulted in loose stools, today passed a small hard balls of stool. Stools float. Foamy stool x 1. CTAP showed possible wall thickening to the rectosigmoid colon possibly indicating colitis verses diverticulitis. No abdominal pain.  -CRP -Miralax Q HS -Check pancreatic elastase (unlikely pancreatic insufficiency, CTAP showed a normal pancreas) -I discussed scheduling a colonoscopy (with caution in an 86 year old with a splenic artery aneurysm) verses a flexible sigmoidoscopy to  assess the rectosigmoid colon, to rule out IBD/coliitis/diverticulitis and malignancy. She wishes to avoid any invasive procedure, however, I advised the patient that I would consult with Dr. Havery Moros to further review her symptoms and CTAP results. Await Dr. Doyne Keel recommendations.  -Probiotic once daily -Avoid dairy   2. CTAP 5/10 showed questionable gastric wall thickening -Dr .Duanne Guess to review CT  -Consider EGD if flexible sigmoidoscopy or colonoscopy to be done   3. Hyponatremia -Repeat BMP  3. Hypothyroidism with low TSH 07/2019  on Levothyroxine -Follow up with PCP   4. Chronic diastolic CHF, stable   5. History of  anemia post right knee replacement revision surgery in 2016, remains on Ferrous Sulfate and folic acid since that time.   5. Cholelithiasis, asymptomatic     CC:  Reed, Tiffany L, DO

## 2019-12-30 ENCOUNTER — Encounter: Payer: Self-pay | Admitting: Nurse Practitioner

## 2019-12-30 ENCOUNTER — Other Ambulatory Visit (INDEPENDENT_AMBULATORY_CARE_PROVIDER_SITE_OTHER): Payer: Medicare Other

## 2019-12-30 ENCOUNTER — Ambulatory Visit: Payer: Medicare Other | Admitting: Nurse Practitioner

## 2019-12-30 VITALS — BP 128/64 | HR 68 | Ht 64.0 in | Wt 252.4 lb

## 2019-12-30 DIAGNOSIS — R194 Change in bowel habit: Secondary | ICD-10-CM

## 2019-12-30 DIAGNOSIS — K5900 Constipation, unspecified: Secondary | ICD-10-CM

## 2019-12-30 LAB — BASIC METABOLIC PANEL
BUN: 16 mg/dL (ref 6–23)
CO2: 32 mEq/L (ref 19–32)
Calcium: 9.4 mg/dL (ref 8.4–10.5)
Chloride: 104 mEq/L (ref 96–112)
Creatinine, Ser: 0.88 mg/dL (ref 0.40–1.20)
GFR: 60.8 mL/min (ref >60.00)
Glucose, Bld: 116 mg/dL — ABNORMAL HIGH (ref 70–99)
Potassium: 4.4 mEq/L (ref 3.5–5.1)
Sodium: 137 mEq/L (ref 135–145)

## 2019-12-30 LAB — C-REACTIVE PROTEIN: CRP: <1 mg/dL (ref 0.5–20.0)

## 2019-12-30 NOTE — Patient Instructions (Signed)
If you are age 84 or older, your body mass index should be between 23-30. Your Body mass index is 43.32 kg/m. If this is out of the aforementioned range listed, please consider follow up with your Primary Care Provider.  If you are age 35 or younger, your body mass index should be between 19-25. Your Body mass index is 43.32 kg/m. If this is out of the aformentioned range listed, please consider follow up with your Primary Care Provider.   Your provider has requested that you go to the basement level for lab work before leaving today. Press "B" on the elevator. The lab is located at the first door on the left as you exit the elevator.  Due to recent changes in healthcare laws, you may see the results of your imaging and laboratory studies on MyChart before your provider has had a chance to review them.  We understand that in some cases there may be results that are confusing or concerning to you. Not all laboratory results come back in the same time frame and the provider may be waiting for multiple results in order to interpret others.  Please give [place] 48 hours in order for your provider to thoroughly review all the results before contacting the office for clarification of your results.   START Grosz bacteria probiotic 1 tablet/capsule daily.  NO Dairy products.  You have been scheduled to follow up with Dr. Havery Moros on 02/26/20 at 10:30 am. If you find this date or time will not work for you, please call the office at [phone].  If your symptoms worse prior to your next visit please call the office.

## 2019-12-31 ENCOUNTER — Other Ambulatory Visit: Payer: Medicare Other

## 2019-12-31 DIAGNOSIS — R194 Change in bowel habit: Secondary | ICD-10-CM | POA: Diagnosis not present

## 2019-12-31 DIAGNOSIS — K5900 Constipation, unspecified: Secondary | ICD-10-CM | POA: Diagnosis not present

## 2019-12-31 NOTE — Progress Notes (Signed)
Patient has a follow up appointment with Dr. Havery Moros on 02/26/2020.

## 2019-12-31 NOTE — Progress Notes (Signed)
Agree with assessment with the following thoughts: CT scan findings are pretty mild. Colonic findings could be reactive from severe constipation, which could have been secondary to immodium. Gastric findings could just represent underdistension and are nonspecific mild. At her age, understandable if she wishes to hold off on endoscopic evaluation at this time. Her labs are normal. I would keep an eye on her symptoms and see how she does over the next few weeks and have her come back in for an office visit in 1 month or so for reassessment. If symptoms persist / worsening / changing would see if she would be willing to proceed with endoscopic evaluation. She should contact us in the interim with any issues or recurrence of symptoms

## 2020-01-08 LAB — PANCREATIC ELASTASE, FECAL: Pancreatic Elastase-1, Stool: 365 mcg/g

## 2020-02-02 DIAGNOSIS — D649 Anemia, unspecified: Secondary | ICD-10-CM | POA: Diagnosis not present

## 2020-02-02 DIAGNOSIS — I10 Essential (primary) hypertension: Secondary | ICD-10-CM | POA: Diagnosis not present

## 2020-02-02 DIAGNOSIS — E785 Hyperlipidemia, unspecified: Secondary | ICD-10-CM | POA: Diagnosis not present

## 2020-02-02 LAB — CBC AND DIFFERENTIAL
HCT: 38 (ref 36–46)
Hemoglobin: 12.7 (ref 12.0–16.0)
Platelets: 256 (ref 150–399)
WBC: 5

## 2020-02-02 LAB — BASIC METABOLIC PANEL
BUN: 18 (ref 4–21)
CO2: 24 — AB (ref 13–22)
Chloride: 102 (ref 99–108)
Creatinine: 0.7 (ref 0.5–1.1)
Glucose: 88
Potassium: 4.6 (ref 3.4–5.3)
Sodium: 138 (ref 137–147)

## 2020-02-02 LAB — CBC: RBC: 4.35 (ref 3.87–5.11)

## 2020-02-02 LAB — COMPREHENSIVE METABOLIC PANEL: Calcium: 9.5 (ref 8.7–10.7)

## 2020-02-03 ENCOUNTER — Other Ambulatory Visit: Payer: Self-pay

## 2020-02-03 ENCOUNTER — Encounter: Payer: Self-pay | Admitting: Internal Medicine

## 2020-02-03 ENCOUNTER — Non-Acute Institutional Stay: Payer: Medicare Other | Admitting: Internal Medicine

## 2020-02-03 VITALS — BP 142/80 | HR 63 | Temp 96.0°F | Ht 64.0 in | Wt 254.4 lb

## 2020-02-03 DIAGNOSIS — I10 Essential (primary) hypertension: Secondary | ICD-10-CM

## 2020-02-03 DIAGNOSIS — E7800 Pure hypercholesterolemia, unspecified: Secondary | ICD-10-CM

## 2020-02-03 DIAGNOSIS — Z66 Do not resuscitate: Secondary | ICD-10-CM | POA: Diagnosis not present

## 2020-02-03 DIAGNOSIS — F5101 Primary insomnia: Secondary | ICD-10-CM | POA: Diagnosis not present

## 2020-02-03 DIAGNOSIS — E039 Hypothyroidism, unspecified: Secondary | ICD-10-CM | POA: Diagnosis not present

## 2020-02-03 MED ORDER — LOSARTAN POTASSIUM 100 MG PO TABS: 50.0000 mg | ORAL_TABLET | Freq: Every day | ORAL | 3 refills | Status: DC

## 2020-02-03 MED ORDER — PRAVASTATIN SODIUM 80 MG PO TABS: 80.0000 mg | ORAL_TABLET | Freq: Every day | ORAL | 3 refills | Status: DC

## 2020-02-03 MED ORDER — TRAZODONE HCL 50 MG PO TABS: ORAL_TABLET | ORAL | 3 refills | Status: DC

## 2020-02-03 MED ORDER — LEVOTHYROXINE SODIUM 125 MCG PO TABS: 137.0000 ug | ORAL_TABLET | Freq: Every day | ORAL | 3 refills | Status: DC

## 2020-02-03 NOTE — Progress Notes (Signed)
Location:  Occupational psychologist of Service:  Clinic (12)  Provider: Jayleigh Notarianni L. Mariea Clonts, D.O., C.M.D.  Code Status: DNR Goals of Care:  Advanced Directives 02/03/2020  Does Patient Have a Medical Advance Directive? Yes  Type of Advance Directive Living will;Out of facility DNR (pink MOST or yellow form)  Does patient want to make changes to medical advance directive? No - Patient declined  Copy of [place] in Chart? -  Pre-existing out of facility DNR order (yellow form or pink MOST form) Yellow form placed in chart (order not valid for inpatient use)   Chief Complaint  Patient presents with  . Medical Management of Chronic Issues    6 month follow-up and discuss labs (copy available)   . Advanced Directive    Re-activate DNR order in epic   . Medication Management    Off Losartan and Trazodone x 2 weeks due to [facility] not refilling   . Medication Management    Clarify what does of pravastatin patient should be on. Last labs in Jan 2021 indicated patient should increase to 80 yet med list states 40     HPI: Patient is a 86 y.o. female seen today for medical management of chronic diseases.    She fills her meds in her tray.  She does not have any of her losartan.  She was also out of trazodone.  Was told insurance would not pay for so it could not be filled.  She was told it was filled in May and should have it at home.    She wakes up 2-3 times to urinate if she does not take trazodone.  When takes it, can sleep through the night.    Down to 1-2 bms per day.  No weird foamy bms.  Feels good and appetite is back.    She's doing other things mon/wed and has not started back with exercising at the pool.  Past Medical History:  Diagnosis Date  . Arthritis   . Cancer ([place])    basal cell skin cancer  . Concussion   . Depression   . Dyspnea    upon exertion  . Headache   . Hypertension   . Hypothyroidism   . Stress incontinence     Past  Surgical History:  Procedure Laterality Date  . ABDOMINAL HYSTERECTOMY    . APPENDECTOMY    . CARPAL TUNNEL RELEASE     rt x2  . COLONOSCOPY    . EYE SURGERY Bilateral    cataract surgery with lens implant  . FINGER AMPUTATION Right    little finger  . I & D EXTREMITY  03/11/2012   Procedure: MINOR IRRIGATION AND DEBRIDEMENT EXTREMITY;  Surgeon: Cammie Sickle., MD;  Location: [place];  Service: Orthopedics;  Laterality: Right;  Right little finger  . KNEE ARTHROSCOPY     bilat   . REPLACEMENT TOTAL KNEE BILATERAL    . REVERSE SHOULDER ARTHROPLASTY Right 09/04/2018   Procedure: REVERSE SHOULDER ARTHROPLASTY;  Surgeon: Tania Ade, MD;  Location: [place];  Service: Orthopedics;  Laterality: Right;  . TONSILLECTOMY    . TOTAL KNEE REVISION Right 03/14/2015   Procedure: TIBIAL REVISION, OPEN REDUCTION INTERNAL FIXATION PROXIMAL TIBIA;  Surgeon: Dorna Leitz, MD;  Location: [place];  Service: Orthopedics;  Laterality: Right;  . TRIGGER FINGER RELEASE    . TUBAL LIGATION      Allergies  Allergen Reactions  . Phenergan [Promethazine Hcl] Other (  See Comments)    hallucinations  . Lisinopril Cough    Outpatient Encounter Medications as of 02/03/2020  Medication Sig  . aspirin EC 81 MG tablet Take 81 mg by mouth daily.  . Calcium Carb-Cholecalciferol (CALCIUM 600/VITAMIN D3 PO) Take 1 tablet by mouth daily.  . Cholecalciferol (VITAMIN D-3) 1000 units CAPS Take 1,000 Units by mouth daily.   . ferrous sulfate 325 (65 FE) MG tablet Take 325 mg by mouth daily with breakfast.   . folic acid (FOLVITE) 458 MCG tablet Take 800 mcg by mouth daily.   Marland Kitchen levothyroxine (SYNTHROID) 125 MCG tablet Take 1 tablet (125 mcg total) by mouth daily before breakfast.  . metoprolol tartrate (LOPRESSOR) 25 MG tablet TAKE 1 TABLET BY MOUTH TWICE A DAY WITH FOOD  . Multiple Vitamins-Minerals (PRESERVISION AREDS 2 PO) Take 1 capsule by mouth 2 (two) times daily.  . pravastatin (PRAVACHOL) 40 MG  tablet Take 1 tablet (40 mg total) by mouth daily.  . vitamin C (ASCORBIC ACID) 500 MG tablet Take 500 mg by mouth daily.  Marland Kitchen losartan (COZAAR) 100 MG tablet TAKE 1/2 TABLET (50MG ) BY MOUTH EVERY DAY **50 MG TABS BACKORDERED (Patient not taking: Reported on 02/03/2020)  . traZODone (DESYREL) 50 MG tablet TAKE 1 TABLET BY MOUTH EVERYDAY AT BEDTIME (Patient not taking: Reported on 02/03/2020)   No facility-administered encounter medications on file as of 02/03/2020.    Review of Systems:  Review of Systems  Constitutional: Negative for chills, fever and malaise/fatigue.  HENT: Negative for congestion.   Eyes: Negative for blurred vision.  Respiratory: Negative for cough and shortness of breath.   Cardiovascular: Positive for leg swelling. Negative for chest pain.  Gastrointestinal: Negative for abdominal pain, constipation and diarrhea.  Genitourinary: Negative for dysuria.  Musculoskeletal: Positive for joint pain. Negative for falls.  Skin: Negative for itching and rash.  Neurological: Negative for dizziness, loss of consciousness and weakness.  Psychiatric/Behavioral: Negative for depression and memory loss. The patient is not nervous/anxious and does not have insomnia.     Health Maintenance  Topic Date Due  . PNA vac Low Risk Adult (2 of 2 - PPSV23) 02/28/2016  . INFLUENZA VACCINE  02/28/2020  . TETANUS/TDAP  02/27/2021  . DEXA SCAN  Completed  . COVID-19 Vaccine  Completed    Physical Exam: Vitals:   02/03/20 1047  BP: (!) 142/80  Pulse: 63  Temp: (!) 96 F (35.6 C)  TempSrc: Temporal  SpO2: 97%  Weight: 254 lb 6.4 oz (115.4 kg)  Height: 5\' 4"  (1.626 m)   Body mass index is 43.67 kg/m. Physical Exam Vitals reviewed.  Constitutional:      Appearance: Normal appearance. She is obese.  HENT:     Head: Normocephalic and atraumatic.     Right Ear: There is no impacted cerumen.     Left Ear: There is no impacted cerumen.     Ears:     Comments: HOH Cardiovascular:      Rate and Rhythm: Normal rate and regular rhythm.     Pulses: Normal pulses.     Heart sounds: Normal heart sounds.  Pulmonary:     Effort: Pulmonary effort is normal.     Breath sounds: Normal breath sounds. No wheezing, rhonchi or rales.  Abdominal:     General: Bowel sounds are normal.     Tenderness: There is no abdominal tenderness.  Musculoskeletal:        General: Normal range of motion.     Cervical  back: Neck supple.     Comments: Nonpitting edema bilateral ankles  Skin:    General: Skin is warm and dry.  Neurological:     General: No focal deficit present.     Mental Status: She is alert and oriented to person, place, and time. Mental status is at baseline.     Motor: No weakness.     Gait: Gait normal.  Psychiatric:        Mood and Affect: Mood normal.        Behavior: Behavior normal.        Thought Content: Thought content normal.        Judgment: Judgment normal.     Labs reviewed: Basic Metabolic Panel: Recent Labs    02/10/19 0300 08/11/19 0000 08/11/19 0500 11/12/19 0000 11/12/19 0300 12/30/19 1127 02/02/20 0000  NA  --   --    < > 132*  --  137 138  K  --   --    < > 4.2  --  4.4 4.6  CL  --   --    < > 97*  --  104 102  CO2  --   --    < > 25*  --  32 24*  GLUCOSE  --   --   --   --   --  116*  --   BUN  --   --    < > 12  --  16 18  CREATININE  --   --    < > 0.8  --  0.88 0.7  CALCIUM  --   --    < >  --  9.2 9.4 9.5  TSH 0.35* 0.08*  --   --   --   --   --    < > = values in this interval not displayed.   Liver Function Tests: Recent Labs    02/10/19 0300 08/11/19 0500 11/12/19 0300  AST 23  --  17  ALT 14  --  14  ALKPHOS 78  --   --   ALBUMIN  --  3.9 3.6   No results for input(s): LIPASE, AMYLASE in the last 8760 hours. No results for input(s): AMMONIA in the last 8760 hours. CBC: Recent Labs    08/11/19 0000 11/12/19 0000 02/02/20 0000  WBC 5.3 7.6 5.0  HGB 13.4 12.5 12.7  HCT 39 38 38  PLT 251 384 256   Lipid  Panel: Recent Labs    02/10/19 0000 08/11/19 0500  CHOL 189  --   HDL 53 49  LDLCALC 115 88  TRIG 103 123   Lab Results  Component Value Date   HGBA1C 5.5 06/16/2014    Procedures since last visit: No results found.  Assessment/Plan 1. DNR (do not resuscitate) -order reentered after ED visit -wishes remain unchanged here - Do not attempt resuscitation (DNR)  2. Pure hypercholesterolemia -discussed diet and exercise and she agrees to higher dose pravastatin - pravastatin (PRAVACHOL) 80 MG tablet; Take 1 tablet (80 mg total) by mouth daily.  Dispense: 90 tablet; Refill: 3 -recheck flp next time  3. Hypothyroidism, unspecified type -clinically euthyroid, cont levothyroxine - levothyroxine (SYNTHROID) 125 MCG tablet; Take 1 tablet (125 mcg total) by mouth daily before breakfast.  Dispense: 90 tablet; Refill: 3  4. Essential hypertension -bp at goal, cont same - losartan (COZAAR) 100 MG tablet; Take 0.5 tablets (50 mg total) by mouth daily.  Dispense: 45 tablet; Refill: 3  5. Primary insomnia -doing well with use of trazodone for rest, spirits much better now that her bowel situation has improved and she's not isolated with covid and able to see family - traZODone (DESYREL) 50 MG tablet; TAKE 1 TABLET BY MOUTH EVERYDAY AT BEDTIME  Dispense: 90 tablet; Refill: 3  Labs/tests ordered:  flp tsh Next appt:  06/07/2020  Samantha Stone, D.O. [facility] [address], [place] [address] Cell Phone (Mon-Fri 8am-5pm):  [phone] On Call:  [phone] & follow prompts after 5pm & weekends Office Phone:  [phone] Office Fax:  [phone]

## 2020-02-26 ENCOUNTER — Ambulatory Visit: Payer: Medicare Other | Admitting: Gastroenterology

## 2020-02-27 ENCOUNTER — Emergency Department (HOSPITAL_COMMUNITY): Payer: Medicare Other

## 2020-02-27 ENCOUNTER — Other Ambulatory Visit: Payer: Self-pay

## 2020-02-27 ENCOUNTER — Emergency Department (HOSPITAL_COMMUNITY)
Admission: EM | Admit: 2020-02-27 | Discharge: 2020-02-27 | Disposition: A | Discharge status: Home / Self Care | Payer: Medicare Other | Attending: Emergency Medicine | Admitting: Emergency Medicine

## 2020-02-27 DIAGNOSIS — E039 Hypothyroidism, unspecified: Secondary | ICD-10-CM | POA: Diagnosis not present

## 2020-02-27 DIAGNOSIS — Z96653 Presence of artificial knee joint, bilateral: Secondary | ICD-10-CM | POA: Insufficient documentation

## 2020-02-27 DIAGNOSIS — Y929 Unspecified place or not applicable: Secondary | ICD-10-CM | POA: Diagnosis not present

## 2020-02-27 DIAGNOSIS — Z7982 Long term (current) use of aspirin: Secondary | ICD-10-CM | POA: Diagnosis not present

## 2020-02-27 DIAGNOSIS — Y939 Activity, unspecified: Secondary | ICD-10-CM | POA: Diagnosis not present

## 2020-02-27 DIAGNOSIS — I110 Hypertensive heart disease with heart failure: Secondary | ICD-10-CM | POA: Diagnosis not present

## 2020-02-27 DIAGNOSIS — I509 Heart failure, unspecified: Secondary | ICD-10-CM | POA: Insufficient documentation

## 2020-02-27 DIAGNOSIS — S59902A Unspecified injury of left elbow, initial encounter: Secondary | ICD-10-CM | POA: Diagnosis present

## 2020-02-27 DIAGNOSIS — W100XXA Fall (on)(from) escalator, initial encounter: Secondary | ICD-10-CM | POA: Diagnosis not present

## 2020-02-27 DIAGNOSIS — S51012A Laceration without foreign body of left elbow, initial encounter: Secondary | ICD-10-CM

## 2020-02-27 DIAGNOSIS — Z23 Encounter for immunization: Secondary | ICD-10-CM | POA: Diagnosis not present

## 2020-02-27 DIAGNOSIS — Y999 Unspecified external cause status: Secondary | ICD-10-CM | POA: Insufficient documentation

## 2020-02-27 DIAGNOSIS — Z79899 Other long term (current) drug therapy: Secondary | ICD-10-CM | POA: Diagnosis not present

## 2020-02-27 DIAGNOSIS — Z85828 Personal history of other malignant neoplasm of skin: Secondary | ICD-10-CM | POA: Insufficient documentation

## 2020-02-27 MED ORDER — TETANUS-DIPHTH-ACELL PERTUSSIS 5-2.5-18.5 LF-MCG/0.5 IM SUSP
0.5000 mL | Freq: Once | INTRAMUSCULAR | Status: AC
  Administered 2020-02-27: 0.5 mL via INTRAMUSCULAR
  Filled 2020-02-27: qty 0.5

## 2020-02-27 MED ORDER — BACITRACIN ZINC 500 UNIT/GM EX OINT
TOPICAL_OINTMENT | CUTANEOUS | Status: AC
  Filled 2020-02-27: qty 0.9

## 2020-02-27 MED ORDER — LIDOCAINE-EPINEPHRINE (PF) 2 %-1:200000 IJ SOLN
10.0000 mL | Freq: Once | INTRAMUSCULAR | Status: AC
  Administered 2020-02-27: 10 mL
  Filled 2020-02-27: qty 20

## 2020-02-27 NOTE — ED Triage Notes (Signed)
Patient fell on escalator at [facility] - patient has laceration to left elbow/arm. Bleeding controlled at this time. Pain rated 7/10

## 2020-02-27 NOTE — Discharge Instructions (Addendum)
You have been seen here for a left elbow laceration.  I have placed 11 stitches in your elbow.  Please refrain from showering for the first 24 hours.  After that you may run clean water over the wound twice a day and change dressings daily.  I have also placed you in a sling please keep your elbow flexed as to prevent tearing of the sutures.  You may take over-the-counter pain medications like ibuprofen or Tylenol every 6 hours as need for pain. please follow dosage and on the back of bottle.  I want you to follow-up with your primary care provider in 10 days for suture removal as well as wound check.  I want you to come back to the emergency department if you develop fever, numbness or tingling in your arm, increased swelling, increased redness, pain in your elbow, chest pain, shortness of breath, uncontrolled nausea, vomiting, diarrhea as these symptoms require further evaluation and management.

## 2020-02-27 NOTE — ED Provider Notes (Signed)
[facility] DEPT Provider Note   CSN: [id number] Arrival date & time: 02/27/20  1401     History Chief Complaint  Patient presents with  . Extremity Laceration    Samantha Stone is a 86 y.o. female.  HPI   Patient presents to the emergency emergency department with chief complaint of left elbow laceration.  Patient states while she was at the mall today she fell on the escalator landing directly on her left elbow cutting it open.  She denies hitting her head, losing consciousness, being on anticoagulants.  Patient admits that bending her elbow makes the pain worse and denies any alleviating factors.  Patient has not taking any pain medicine for this tetanus shot is not up-to-date.  Patient has significant medical history of arthritis, basal cell skin cancer, hypertension, hypoparathyroidism.  She denies headache, fever, chills, shortness of breath, chest pain, abdominal pain, dysuria, pedal edema.  Past Medical History:  Diagnosis Date  . Arthritis   . Cancer ([place])    basal cell skin cancer  . Concussion   . Depression   . Dyspnea    upon exertion  . Headache   . Hypertension   . Hypothyroidism   . Stress incontinence     Patient Active Problem List   Diagnosis Date Noted  . Continuous leakage of urine 10/15/2018  . Early dry stage nonexudative age-related macular degeneration of both eyes 10/15/2018  . Presbycusis of both ears 10/15/2018  . S/P reverse total shoulder arthroplasty, right 10/15/2018  . Degenerative arthritis of right shoulder region 09/04/2018  . Morbid (severe) obesity due to excess calories ([place]) 08/28/2016  . Dyspnea on exertion 08/27/2016  . Cough variant asthma 08/27/2016  . Chronic diastolic congestive heart failure ([place]) 05/07/2016  . Postoperative anemia due to acute blood loss 03/17/2015  . Aseptic loosening of prosthetic knee ([facility]) 03/14/2015  . 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 Confusion 06/16/2014  . Acute encephalopathy 06/16/2014  . HLD (hyperlipidemia) 06/16/2014  . Hypothyroidism 06/16/2014  . Frontal headache   . Aphasia 10/23/2013  . Essential hypertension 10/23/2013    Past Surgical History:  Procedure Laterality Date  . ABDOMINAL HYSTERECTOMY    . APPENDECTOMY    . CARPAL TUNNEL RELEASE     rt x2  . COLONOSCOPY    . EYE SURGERY Bilateral    cataract surgery with lens implant  . FINGER AMPUTATION Right    little finger  . I & D EXTREMITY  03/11/2012   Procedure: MINOR IRRIGATION AND DEBRIDEMENT EXTREMITY;  Surgeon: Cammie Sickle., MD;  Location: [facility];  Service: Orthopedics;  Laterality: Right;  Right little finger  . KNEE ARTHROSCOPY     bilat   . REPLACEMENT TOTAL KNEE BILATERAL    . REVERSE SHOULDER ARTHROPLASTY Right 09/04/2018   Procedure: REVERSE SHOULDER ARTHROPLASTY;  Surgeon: Tania Ade, MD;  Location: [facility];  Service: Orthopedics;  Laterality: Right;  . TONSILLECTOMY    . TOTAL KNEE REVISION Right 03/14/2015   Procedure: TIBIAL REVISION, OPEN REDUCTION INTERNAL FIXATION PROXIMAL TIBIA;  Surgeon: Dorna Leitz, MD;  Location: [facility];  Service: Orthopedics;  Laterality: Right;  . TRIGGER FINGER RELEASE    . TUBAL LIGATION       OB History   No obstetric history on file.     Family History  Problem Relation Age of Onset  . Bladder Cancer Father  Social History   Tobacco Use  . Smoking status: Never Smoker  . Smokeless tobacco: Never Used  Vaping Use  . Vaping Use: Never used  Substance Use Topics  . Alcohol use: Yes    Alcohol/week: 14.0 standard drinks    Types: 14 Cans of beer per week    Comment: "couple" beers daily  . Drug use: No    Home Medications Prior to Admission medications   Medication Sig Start Date End Date Taking? Authorizing Provider  aspirin EC 81 MG tablet Take 81 mg by mouth daily.    [provider]    Calcium Carb-Cholecalciferol (CALCIUM 600/VITAMIN D3 PO) Take 1 tablet by mouth daily.    [provider]  Cholecalciferol (VITAMIN D-3) 1000 units CAPS Take 1,000 Units by mouth daily.     [provider]  ferrous sulfate 325 (65 FE) MG tablet Take 325 mg by mouth daily with breakfast.     [provider]  folic acid (FOLVITE) 465 MCG tablet Take 800 mcg by mouth daily.     [provider]  levothyroxine (SYNTHROID) 125 MCG tablet Take 1 tablet (125 mcg total) by mouth daily before breakfast. 02/03/20   Reed, Tiffany L, DO  losartan (COZAAR) 100 MG tablet Take 0.5 tablets (50 mg total) by mouth daily. 02/03/20   Reed, Tiffany L, DO  metoprolol tartrate (LOPRESSOR) 25 MG tablet TAKE 1 TABLET BY MOUTH TWICE A DAY WITH FOOD 12/09/19   Reed, Tiffany L, DO  Multiple Vitamins-Minerals (PRESERVISION AREDS 2 PO) Take 1 capsule by mouth 2 (two) times daily.    [provider]  pravastatin (PRAVACHOL) 80 MG tablet Take 1 tablet (80 mg total) by mouth daily. 02/03/20   Reed, Tiffany L, DO  traZODone (DESYREL) 50 MG tablet TAKE 1 TABLET BY MOUTH EVERYDAY AT BEDTIME 02/03/20   Reed, Tiffany L, DO  vitamin C (ASCORBIC ACID) 500 MG tablet Take 500 mg by mouth daily.    [provider]    Allergies    Phenergan [promethazine hcl] and Lisinopril  Review of Systems   Review of Systems  Constitutional: Negative for chills and fever.  HENT: Negative for congestion and sore throat.   Eyes: Negative for visual disturbance.  Respiratory: Negative for cough and shortness of breath.   Cardiovascular: Negative for chest pain and palpitations.  Gastrointestinal: Negative for abdominal pain, diarrhea, nausea and vomiting.  Genitourinary: Negative for enuresis and pelvic pain.  Musculoskeletal: Negative for back pain and joint swelling.  Skin: Negative for rash.       Admits to laceration on her left elbow.  Neurological: Negative for dizziness and headaches.   Hematological: Does not bruise/bleed easily.    Physical Exam Updated Vital Signs BP 127/80 (BP Location: Right Arm)   Pulse 66   Temp 98 F (36.7 C) (Oral)   Resp 19   Ht 5\' 4"  (1.626 m)   Wt (!) 113.4 kg   SpO2 100%   BMI 42.91 kg/m   Physical Exam Vitals and nursing note reviewed.  Constitutional:      General: She is not in acute distress.    Appearance: Normal appearance. She is not ill-appearing or diaphoretic.  HENT:     Head: Normocephalic and atraumatic.     Nose: No congestion or rhinorrhea.  Eyes:     General: No scleral icterus.       Right eye: No discharge.        Left eye: No discharge.  Conjunctiva/sclera: Conjunctivae normal.  Pulmonary:     Effort: Pulmonary effort is normal. No respiratory distress.     Breath sounds: Normal breath sounds. No wheezing.  Musculoskeletal:     Cervical back: Neck supple.     Right lower leg: No edema.     Left lower leg: No edema.     Comments: Patient's left arm was evaluated, there was a large laceration on her left elbow, measuring about 5 cm in length.  Patient had full range of motion in her shoulder, elbow, wrist and fingers, she had equal strength, good radial pulses, good capillary refill, sensation fully intact.  Skin:    General: Skin is warm and dry.     Coloration: Skin is not jaundiced or pale.  Neurological:     Mental Status: She is alert and oriented to person, place, and time.  Psychiatric:        Mood and Affect: Mood normal.     ED Results / Procedures / Treatments   Labs (all labs ordered are listed, but only abnormal results are displayed) Labs Reviewed - No data to display  EKG None  Radiology DG Elbow Complete Left  Result Date: 02/27/2020 CLINICAL DATA:  Fall, laceration. EXAM: LEFT ELBOW - COMPLETE 3+ VIEW COMPARISON:  None. FINDINGS: Osseous alignment is normal. No fracture line or displaced fracture fragment is seen. No evidence of joint effusion seen. Soft tissue laceration  overlying the olecranon. IMPRESSION: Soft tissue laceration. No evidence of acute osseous fracture or dislocation. Electronically Signed   By: Franki Cabot M.D.   On: 02/27/2020 16:09    Procedures .Marland KitchenLaceration Repair  Date/Time: 02/27/2020 5:46 PM Performed by: Marcello Fennel, PA-C Authorized by: Marcello Fennel, PA-C   Consent:    Consent obtained:  Verbal   Consent given by:  Patient   Risks discussed:  Infection, pain, retained foreign body, need for additional repair, poor cosmetic result, tendon damage, vascular damage, poor wound healing and nerve damage   Alternatives discussed:  No treatment Anesthesia (see MAR for exact dosages):    Anesthesia method:  Local infiltration   Local anesthetic:  Lidocaine 2% WITH epi Laceration details:    Location:  Shoulder/arm   Shoulder/arm location:  L elbow   Length (cm):  5   Depth (mm):  2 Repair type:    Repair type:  Intermediate Pre-procedure details:    Preparation:  Patient was prepped and draped in usual sterile fashion and imaging obtained to evaluate for foreign bodies Exploration:    Wound exploration: wound explored through full range of motion and entire depth of wound probed and visualized     Contaminated: no   Treatment:    Area cleansed with:  Betadine and saline   Amount of cleaning:  Standard   Irrigation solution:  Sterile saline   Irrigation method:  Syringe   Visualized foreign bodies/material removed: no   Skin repair:    Repair method:  Sutures   Suture size:  4-0   Suture material:  Prolene   Suture technique:  Simple interrupted   Number of sutures:  11 Approximation:    Approximation:  Loose Post-procedure details:    Dressing:  Antibiotic ointment and non-adherent dressing   Patient tolerance of procedure:  Tolerated well, no immediate complications Comments:     After the procedure patient motor, sensation, strength were all intact.  Left elbow  was soft to the touch with good capillary  refill.  No signs of infection  were noted, no rash, no ligament or tendon damage present.   (including critical care time)  Medications Ordered in ED Medications  bacitracin 500 UNIT/GM ointment (has no administration in time range)  lidocaine-EPINEPHrine (XYLOCAINE W/EPI) 2 %-1:200000 (PF) injection 10 mL (10 mLs Infiltration Given 02/27/20 1610)  Tdap (BOOSTRIX) injection 0.5 mL (0.5 mLs Intramuscular Given 02/27/20 1610)    ED Course  I have reviewed the triage vital signs and the nursing notes.  Pertinent labs & imaging results that were available during my care of the patient were reviewed by me and considered in my medical decision making (see chart for details).    MDM Rules/Calculators/A&P                          I have personally reviewed all imaging, labs and have interpreted them.  X-ray of left elbow did not show dislocations, fractures, foreign objects in wound.  I have low suspicion for tendon or ligament damage as patient was able to articulate the joints below and above the wound without difficulty..  I have low suspicion for compartment syndrome as neurovascular was fully intact.  Patient's laceration required suture closure, I placed 11 stitches and placed patient in a sling to prevent patient from sutures re-opening.  Patient tolerated the procedure well, neurovascular was fully intact after the procedure.  Patient was given a tetanus shot as it was out of date.  Due to patient's nontoxic-appearing, reassuring vital signs, benign physical exam further lab work and imaging were not warranted at this time.  Patient appears to be resting comfortably in bed showing no acute signs stress.  Vital signs have remained stable does not meet criteria to be admitted to the hospital.  Patient fell and suffered a laceration to her left elbow, she received 11 stitches to close up wound and placed in a sling.  Recommend she follows up with her PCP for wound evaluation and suture removal in  10 days.  Patient was discussed with attending who agreed with assessment and plan.  Patient was given at home care as well strict return precautions.  Patient verbalized that she understood and agreed with said plan. Final Clinical Impression(s) / ED Diagnoses Final diagnoses:  Elbow laceration, left, initial encounter    Rx / DC Orders ED Discharge Orders    None       Marcello Fennel, PA-C 02/27/20 [place], MD 02/28/20 1606

## 2020-03-02 ENCOUNTER — Telehealth: Payer: Self-pay

## 2020-03-02 NOTE — Telephone Encounter (Signed)
Patient states she fell and has a wound. She said she has been seeing Mliss Sax and that Mliss Sax thought she might need an antibiotic and to call us. Patient stated that Mliss Sax had pictures and was going to show you the pictures today to see what you thought. I told patient that I would send you a message to see if you were able to prescribe her an antibiotic based on the pictures.  I told patient you did not have any availability at [facility] for several weeks, but that we could have her seen in the office.

## 2020-03-02 NOTE — Telephone Encounter (Signed)
Please let her know:  I reviewed the photos of the wounds.  There is very mild erythema around the sutures.  At this point, it does not appear infected.  Continue with the topical antibiotic ointment.  I also gave Samantha Stone an order to remove the sutures at 10 days from the date of placement.  If the redness gets worse, she develops increased warmth, swelling, or drainage, we can start antibiotics.  Samantha Stone is going to keep me up to date

## 2020-03-02 NOTE — Telephone Encounter (Signed)
Patient called and given recommendations. She was ok with the current course.

## 2020-03-17 DIAGNOSIS — Z96652 Presence of left artificial knee joint: Secondary | ICD-10-CM | POA: Diagnosis not present

## 2020-03-17 DIAGNOSIS — Z96651 Presence of right artificial knee joint: Secondary | ICD-10-CM | POA: Diagnosis not present

## 2020-03-17 DIAGNOSIS — M25562 Pain in left knee: Secondary | ICD-10-CM | POA: Diagnosis not present

## 2020-03-18 ENCOUNTER — Other Ambulatory Visit: Payer: Self-pay | Admitting: Internal Medicine

## 2020-04-07 DIAGNOSIS — M25562 Pain in left knee: Secondary | ICD-10-CM | POA: Diagnosis not present

## 2020-05-12 DIAGNOSIS — M25562 Pain in left knee: Secondary | ICD-10-CM | POA: Diagnosis not present

## 2020-05-16 ENCOUNTER — Other Ambulatory Visit (HOSPITAL_COMMUNITY): Payer: Self-pay | Admitting: Orthopedic Surgery

## 2020-05-16 DIAGNOSIS — M25562 Pain in left knee: Secondary | ICD-10-CM

## 2020-05-16 DIAGNOSIS — Z96653 Presence of artificial knee joint, bilateral: Secondary | ICD-10-CM

## 2020-05-23 ENCOUNTER — Encounter (HOSPITAL_COMMUNITY): Payer: Medicare Other

## 2020-05-23 ENCOUNTER — Encounter (HOSPITAL_COMMUNITY): Payer: Self-pay

## 2020-05-23 ENCOUNTER — Ambulatory Visit (HOSPITAL_COMMUNITY): Payer: Medicare Other

## 2020-05-30 ENCOUNTER — Encounter (HOSPITAL_COMMUNITY)
Admission: RE | Admit: 2020-05-30 | Discharge: 2020-05-30 | Disposition: A | Discharge status: Home / Self Care | Payer: Medicare Other | Source: Ambulatory Visit | Attending: Orthopedic Surgery | Admitting: Orthopedic Surgery

## 2020-05-30 ENCOUNTER — Other Ambulatory Visit: Payer: Self-pay

## 2020-05-30 DIAGNOSIS — M25562 Pain in left knee: Secondary | ICD-10-CM

## 2020-05-30 DIAGNOSIS — Z96653 Presence of artificial knee joint, bilateral: Secondary | ICD-10-CM | POA: Diagnosis not present

## 2020-05-30 MED ORDER — TECHNETIUM TC 99M MEDRONATE IV KIT
20.0000 | PACK | Freq: Once | INTRAVENOUS | Status: AC | PRN
  Administered 2020-05-30: 20 via INTRAVENOUS

## 2020-06-02 DIAGNOSIS — M25562 Pain in left knee: Secondary | ICD-10-CM | POA: Diagnosis not present

## 2020-06-02 DIAGNOSIS — Z96651 Presence of right artificial knee joint: Secondary | ICD-10-CM | POA: Diagnosis not present

## 2020-06-02 DIAGNOSIS — Z96652 Presence of left artificial knee joint: Secondary | ICD-10-CM | POA: Diagnosis not present

## 2020-06-07 ENCOUNTER — Ambulatory Visit (INDEPENDENT_AMBULATORY_CARE_PROVIDER_SITE_OTHER): Payer: Medicare Other | Admitting: Nurse Practitioner

## 2020-06-07 ENCOUNTER — Other Ambulatory Visit: Payer: Self-pay

## 2020-06-07 ENCOUNTER — Encounter: Payer: Self-pay | Admitting: Nurse Practitioner

## 2020-06-07 ENCOUNTER — Telehealth: Payer: Self-pay

## 2020-06-07 DIAGNOSIS — Z0000 Encounter for general adult medical examination without abnormal findings: Secondary | ICD-10-CM

## 2020-06-07 NOTE — Progress Notes (Signed)
Subjective:   Samantha Stone is a 86 y.o. female who presents for Medicare Annual (Subsequent) preventive examination.  Review of Systems     Cardiac Risk Factors include: obesity (BMI >30kg/m2);advanced age (>63men, >59 women);sedentary lifestyle;hypertension;dyslipidemia     Objective:    Today's Vitals   06/07/20 1030  PainSc: 9    There is no height or weight on file to calculate BMI.  Advanced Directives 06/07/2020 06/07/2020 02/03/2020 11/11/2019 08/19/2019 06/04/2019 06/04/2019  Does Patient Have a Medical Advance Directive? Yes Yes Yes Yes Yes Yes Yes  Type of Advance Directive Living will;Out of facility DNR (pink MOST or yellow form) Living will;Out of facility DNR (pink MOST or yellow form) Living will;Out of facility DNR (pink MOST or yellow form) Healthcare Power of [employer] of facility DNR (pink MOST or yellow form) [place];Living will [place];Living will  Does patient want to make changes to medical advance directive? No - Patient declined No - Patient declined No - Patient declined No - Patient declined No - Patient declined No - Patient declined No - Patient declined  Copy of [place] in Chart? - - - - - Yes - validated most recent copy scanned in chart (See row information) Yes - validated most recent copy scanned in chart (See row information)  Pre-existing out of facility DNR order (yellow form or pink MOST form) - - Yellow form placed in chart (order not valid for inpatient use) - - - -    Current Medications (verified) Outpatient Encounter Medications as of 06/07/2020  Medication Sig  . aspirin EC 81 MG tablet Take 81 mg by mouth daily.  . Calcium Carb-Cholecalciferol (CALCIUM 600/VITAMIN D3 PO) Take 1 tablet by mouth daily.  . Cholecalciferol (VITAMIN D-3) 1000 units CAPS Take 1,000 Units by mouth daily.   . ferrous sulfate 325 (65 FE) MG tablet Take 325 mg by mouth daily with breakfast.   . folic  acid (FOLVITE) 811 MCG tablet Take 800 mcg by mouth daily.   Marland Kitchen levothyroxine (SYNTHROID) 125 MCG tablet Take 1 tablet (125 mcg total) by mouth daily before breakfast.  . losartan (COZAAR) 100 MG tablet Take 0.5 tablets (50 mg total) by mouth daily.  . metoprolol tartrate (LOPRESSOR) 25 MG tablet TAKE 1 TABLET BY MOUTH TWICE A DAY WITH FOOD  . Multiple Vitamins-Minerals (PRESERVISION AREDS 2 PO) Take 1 capsule by mouth 2 (two) times daily.  . pravastatin (PRAVACHOL) 80 MG tablet Take 1 tablet (80 mg total) by mouth daily.  . traZODone (DESYREL) 50 MG tablet TAKE 1 TABLET BY MOUTH EVERYDAY AT BEDTIME  . vitamin C (ASCORBIC ACID) 500 MG tablet Take 500 mg by mouth daily.   No facility-administered encounter medications on file as of 06/07/2020.    Allergies (verified) Phenergan [promethazine hcl] and Lisinopril   History: Past Medical History:  Diagnosis Date  . Arthritis   . Cancer ([place])    basal cell skin cancer  . Concussion   . Depression   . Dyspnea    upon exertion  . Headache   . Hypertension   . Hypothyroidism   . Stress incontinence   . Total knee replacement status, bilateral    Past Surgical History:  Procedure Laterality Date  . ABDOMINAL HYSTERECTOMY    . APPENDECTOMY    . CARPAL TUNNEL RELEASE     rt x2  . COLONOSCOPY    . EYE SURGERY Bilateral    cataract surgery  with lens implant  . FINGER AMPUTATION Right    little finger  . I & D EXTREMITY  03/11/2012   Procedure: MINOR IRRIGATION AND DEBRIDEMENT EXTREMITY;  Surgeon: Cammie Sickle., MD;  Location: [facility];  Service: Orthopedics;  Laterality: Right;  Right little finger  . KNEE ARTHROSCOPY     bilat   . REPLACEMENT TOTAL KNEE BILATERAL    . REVERSE SHOULDER ARTHROPLASTY Right 09/04/2018   Procedure: REVERSE SHOULDER ARTHROPLASTY;  Surgeon: Tania Ade, MD;  Location: [facility];  Service: Orthopedics;  Laterality: Right;  . TONSILLECTOMY    . TOTAL KNEE REVISION Right 03/14/2015    Procedure: TIBIAL REVISION, OPEN REDUCTION INTERNAL FIXATION PROXIMAL TIBIA;  Surgeon: Dorna Leitz, MD;  Location: [facility];  Service: Orthopedics;  Laterality: Right;  . TRIGGER FINGER RELEASE    . TUBAL LIGATION     Family History  Problem Relation Age of Onset  . Bladder Cancer Father    Social History   Socioeconomic History  . Marital status: Widowed    Spouse name: Not on file  . Number of children: 4  . Years of education: Not on file  . Highest education level: Not on file  Occupational History  . Occupation: Retired Therapist, sports  Tobacco Use  . Smoking status: Never Smoker  . Smokeless tobacco: Never Used  Vaping Use  . Vaping Use: Never used  Substance and Sexual Activity  . Alcohol use: Yes    Alcohol/week: 14.0 standard drinks    Types: 14 Cans of beer per week    Comment: "couple" beers daily  . Drug use: No  . Sexual activity: Not on file  Other Topics Concern  . Not on file  Social History Narrative   Tobacco use, amount per day now: NEVER   Past tobacco use, amount per day:   How many years did you use tobacco:   Alcohol use (drinks per week): BEER COUPLE A DAY   Diet: REGULAR   Do you drink/eat things with caffeine: COFFEE ONCE A DAY   Marital status:   WIDOW                               What year were you married? 1ST TIME 1956 DIED 2ND 1987 DIED 2013/11/07   Do you live in a house, apartment, assisted living, condo, trailer, etc.? APARTMENT   Is it one or more stories? ONE   How many persons live in your home? ONE   Do you have pets in your home?( please list) [place]   Current or past profession: RN   Do you exercise?        NOT OFTEN ENOUGH                          Type and how often? BEFORE SHOULDER SURG, I WALKED AND SWAM   Do you have a living will? YES   Do you have a DNR form?   YES                                If not, do you want to discuss one?   Do you have signed POA/HPOA forms?      YES  If so, please bring to you  appointment   Social Determinants of Health   Financial Resource Strain:   . Difficulty of Paying Living Expenses: Not on file  Food Insecurity:   . Worried About Charity fundraiser in the Last Year: Not on file  . Ran Out of Food in the Last Year: Not on file  Transportation Needs:   . Lack of Transportation (Medical): Not on file  . Lack of Transportation (Non-Medical): Not on file  Physical Activity:   . Days of Exercise per Week: Not on file  . Minutes of Exercise per Session: Not on file  Stress:   . Feeling of Stress : Not on file  Social Connections:   . Frequency of Communication with Friends and Family: Not on file  . Frequency of Social Gatherings with Friends and Family: Not on file  . Attends Religious Services: Not on file  . Active Member of Clubs or Organizations: Not on file  . Attends Archivist Meetings: Not on file  . Marital Status: Not on file    Tobacco Counseling Counseling given: Not Answered   Clinical Intake:  Pre-visit preparation completed: Yes  Pain : 0-10 Pain Score: 9  Pain Type: Acute pain Pain Location: Knee Pain Orientation: Left Pain Descriptors / Indicators: Aching Pain Onset: More than a month ago Pain Frequency: Constant Pain Relieving Factors: tylenol, rest Effect of Pain on Daily Activities: walking makes pain worse.  Pain Relieving Factors: tylenol, rest  BMI - recorded: 42 Nutritional Status: BMI > 30  Obese Diabetes: No  How often do you need to have someone help you when you read instructions, pamphlets, or other written materials from your doctor or pharmacy?: 1 - Never  Diabetic?no         Activities of Daily Living In your present state of health, do you have any difficulty performing the following activities: 06/07/2020  Hearing? N  Vision? N  Difficulty concentrating or making decisions? N  Walking or climbing stairs? Y  Dressing or bathing? N  Doing errands, shopping? N  Preparing Food and  eating ? N  Using the Toilet? N  In the past six months, have you accidently leaked urine? Y  Do you have problems with loss of bowel control? N  Managing your Medications? N  Managing your Finances? N  Housekeeping or managing your Housekeeping? N  Some recent data might be hidden    Patient Care Team: Gayland Curry, DO as PCP - General (Geriatric Medicine) Nahser, Wonda Cheng, MD as PCP - Cardiology (Cardiology) Tania Ade, MD as Consulting Physician (Orthopedic Surgery) Nahser, Wonda Cheng, MD as Consulting Physician (Cardiology) Darleen Crocker, MD as Consulting Physician (Ophthalmology) Irine Seal, MD as Attending Physician (Urology) Dorna Leitz, MD as Consulting Physician (Orthopedic Surgery) Tanda Rockers, MD as Consulting Physician (Pulmonary Disease)  Indicate any recent Medical Services you may have received from other than [facility] providers in the past year (date may be approximate).     Assessment:   This is a routine wellness examination for Chamille.  Hearing/Vision screen  Hearing Screening   125Hz  250Hz  500Hz  1000Hz  2000Hz  3000Hz  4000Hz  6000Hz  8000Hz   Right ear:           Left ear:           Comments: Patient states she has hearing aids, but does not wear them  Vision Screening Comments: Patient wears glasses. Patient has had recent eye exam within past year  Dietary  issues and exercise activities discussed: Current Exercise Habits: The patient does not participate in regular exercise at present  Goals    . Patient Stated     Control of knee pain after surgery     . Walk More for exercise      Depression Screen PHQ 2/9 Scores 06/07/2020 11/11/2019 08/19/2019 06/04/2019 02/18/2019 10/15/2018  PHQ - 2 Score 0 0 0 0 0 0    Fall Risk Fall Risk  06/07/2020 11/17/2019 11/11/2019 08/19/2019 06/04/2019  Falls in the past year? 1 0 - 0 1  Number falls in past yr: 1 0 0 0 0  Comment - - - - Tripped  Injury with Fall? 1 0 0 0 0  Comment patient had laceration on left  elbow - - - -    Any stairs in or around the home? No  If so, are there any without handrails? No  Home free of loose throw rugs in walkways, pet beds, electrical cords, etc? Yes  Adequate lighting in your home to reduce risk of falls? Yes   ASSISTIVE DEVICES UTILIZED TO PREVENT FALLS:  Life alert? No  Use of a cane, walker or w/c? Yes  Grab bars in the bathroom? No  Shower chair or bench in shower? Yes  Elevated toilet seat or a handicapped toilet? Yes   TIMED UP AND GO:  Was the test performed? No .    Cognitive Function:     6CIT Screen 06/07/2020 06/04/2019  What Year? 0 points 0 points  What month? 0 points 0 points  What time? 0 points 0 points  Count back from 20 0 points 0 points  Months in reverse 0 points 0 points  Repeat phrase 0 points 0 points  Total Score 0 0    Immunizations Immunization History  Administered Date(s) Administered  . Influenza Whole 03/30/2016  . Influenza, High Dose Seasonal PF 04/17/2018, 03/28/2019  . Influenza-Unspecified 06/02/2020  . Moderna SARS-COVID-2 Vaccination 08/11/2019, 09/08/2019  . Pneumococcal Conjugate-13 02/28/2015  . Pneumococcal Polysaccharide-23 07/30/1996  . Td 02/28/2011  . Tdap 02/27/2020  . Zoster 08/30/2006  . Zoster Recombinat (Shingrix) 03/31/2019, 06/12/2019    TDAP status: Up to date Flu Vaccine status: Up to date Pneumococcal vaccine status: Up to date Covid-19 vaccine status: Completed vaccines  Qualifies for Shingles Vaccine? Yes   Zostavax completed Yes   Shingrix Completed?: Yes  Screening Tests Health Maintenance  Topic Date Due  . PNA vac Low Risk Adult (2 of 2 - PPSV23) 02/28/2016  . TETANUS/TDAP  02/26/2030  . INFLUENZA VACCINE  Completed  . DEXA SCAN  Completed  . COVID-19 Vaccine  Completed    Health Maintenance  Health Maintenance Due  Topic Date Due  . PNA vac Low Risk Adult (2 of 2 - PPSV23) 02/28/2016    Colorectal cancer screening: No longer required.  Mammogram  status: No longer required.  Bone Density status: Completed 2021. Results reflect: Bone density results: NORMAL. Repeat every 2 years.  Lung Cancer Screening: (Low Dose CT Chest recommended if Age 44-80 years, 30 pack-year currently smoking OR have quit w/in 15years.) does not qualify.   Lung Cancer Screening Referral: na  Additional Screening:  Hepatitis C Screening: does not qualify; Completed na  Vision Screening: Recommended annual ophthalmology exams for early detection of glaucoma and other disorders of the eye. Is the patient up to date with their annual eye exam?  Yes  Who is the provider or what is the name of the office  in which the patient attends annual eye exams? Dr Idolina Primer If pt is not established with a provider, would they like to be referred to a provider to establish care? No .   Dental Screening: Recommended annual dental exams for proper oral hygiene  Community Resource Referral / Chronic Care Management: CRR required this visit?  No   CCM required this visit?  No      Plan:     I have personally reviewed and noted the following in the patient's chart:   . Medical and social history . Use of alcohol, tobacco or illicit drugs  . Current medications and supplements . Functional ability and status . Nutritional status . Physical activity . Advanced directives . List of other physicians . Hospitalizations, surgeries, and ER visits in previous 12 months . Vitals . Screenings to include cognitive, depression, and falls . Referrals and appointments  In addition, I have reviewed and discussed with patient certain preventive protocols, quality metrics, and best practice recommendations. A written personalized care plan for preventive services as well as general preventive health recommendations were provided to patient.     Lauree Chandler, NP   06/07/2020    Virtual Visit via Telephone Note  I connected with@ on 06/07/20 at 10:30 AM EST by telephone and  verified that I am speaking with the correct person using two identifiers.  Location: Patient: home Provider: [facility]   I discussed the limitations, risks, security and privacy concerns of performing an evaluation and management service by telephone and the availability of in person appointments. I also discussed with the patient that there may be a patient responsible charge related to this service. The patient expressed understanding and agreed to proceed.   I discussed the assessment and treatment plan with the patient. The patient was provided an opportunity to ask questions and all were answered. The patient agreed with the plan and demonstrated an understanding of the instructions.   The patient was advised to call back or seek an in-person evaluation if the symptoms worsen or if the condition fails to improve as anticipated.  I provided 20 minutes of non-face-to-face time during this encounter.  Carlos American. Harle Battiest Avs printed and mailed

## 2020-06-07 NOTE — Telephone Encounter (Signed)
[name], meyering are scheduled for a virtual visit with your provider today.    Just as we do with appointments in the office, we must obtain your consent to participate.  Your consent will be active for this visit and any virtual visit you may have with one of our providers in the next 365 days.    If you have a MyChart account, I can also send a copy of this consent to you electronically.  All virtual visits are billed to your insurance company just like a traditional visit in the office.  As this is a virtual visit, video technology does not allow for your provider to perform a traditional examination.  This may limit your provider's ability to fully assess your condition.  If your provider identifies any concerns that need to be evaluated in person or the need to arrange testing such as labs, EKG, etc, we will make arrangements to do so.    Although advances in technology are sophisticated, we cannot ensure that it will always work on either your end or our end.  If the connection with a video visit is poor, we may have to switch to a telephone visit.  With either a video or telephone visit, we are not always able to ensure that we have a secure connection.   I need to obtain your verbal consent now.   Are you willing to proceed with your visit today?   Samantha Stone has provided verbal consent on 06/07/2020 for a virtual visit (video or telephone).   Carroll Kinds, CMA 06/07/2020  10:18 AM

## 2020-06-07 NOTE — Progress Notes (Signed)
This service is provided via telemedicine  No vital signs collected/recorded due to the encounter was a telemedicine visit.   Location of patient (ex: home, work): Home  Patient consents to a telephone visit:  Yes, see encounter dated 06/07/2020  Location of the provider (ex: office, home): [place]  Name of any referring provider:  Shelby Mattocks, DO  Names of all persons participating in the telemedicine service and their role in the encounter:  Sherrie Mustache, Nurse Practitioner, Carroll Kinds, CMA, and patient.   Time spent on call:  12 minutes with medical assistant

## 2020-06-07 NOTE — Patient Instructions (Addendum)
Ms. Samantha Stone , Thank you for taking time to come for your Medicare Wellness Visit. I appreciate your ongoing commitment to your health goals. Please review the following plan we discussed and let me know if I can assist you in the future.   Screening recommendations/referrals: Colonoscopy aged out Mammogram aged out Bone Density up to date Recommended yearly ophthalmology/optometry visit for glaucoma screening and checkup Recommended yearly dental visit for hygiene and checkup  Vaccinations: Influenza vaccine up to date Pneumococcal vaccine up to date  Tdap vaccine up to date Shingles vaccine up to date    Advanced directives: on file.   Conditions/risks identified: obesity, advanced age, hypertension, hyperlipidemia  Next appointment: 1 year    Preventive Care 17 Years and Older, Female Preventive care refers to lifestyle choices and visits with your health care provider that can promote health and wellness. What does preventive care include?  A yearly physical exam. This is also called an annual well check.  Dental exams once or twice a year.  Routine eye exams. Ask your health care provider how often you should have your eyes checked.  Personal lifestyle choices, including:  Daily care of your teeth and gums.  Regular physical activity.  Eating a healthy diet.  Avoiding tobacco and drug use.  Limiting alcohol use.  Practicing safe sex.  Taking low-dose aspirin every day.  Taking vitamin and mineral supplements as recommended by your health care provider. What happens during an annual well check? The services and screenings done by your health care provider during your annual well check will depend on your age, overall health, lifestyle risk factors, and family history of disease. Counseling  Your health care provider may ask you questions about your:  Alcohol use.  Tobacco use.  Drug use.  Emotional well-being.  Home and relationship  well-being.  Sexual activity.  Eating habits.  History of falls.  Memory and ability to understand (cognition).  Work and work Statistician.  Reproductive health. Screening  You may have the following tests or measurements:  Height, weight, and BMI.  Blood pressure.  Lipid and cholesterol levels. These may be checked every 5 years, or more frequently if you are over 9 years old.  Skin check.  Lung cancer screening. You may have this screening every year starting at age 50 if you have a 30-pack-year history of smoking and currently smoke or have quit within the past 15 years.  Fecal occult blood test (FOBT) of the stool. You may have this test every year starting at age 73.  Flexible sigmoidoscopy or colonoscopy. You may have a sigmoidoscopy every 5 years or a colonoscopy every 10 years starting at age 85.  Hepatitis C blood test.  Hepatitis B blood test.  Sexually transmitted disease (STD) testing.  Diabetes screening. This is done by checking your blood sugar (glucose) after you have not eaten for a while (fasting). You may have this done every 1-3 years.  Bone density scan. This is done to screen for osteoporosis. You may have this done starting at age 81.  Mammogram. This may be done every 1-2 years. Talk to your health care provider about how often you should have regular mammograms. Talk with your health care provider about your test results, treatment options, and if necessary, the need for more tests. Vaccines  Your health care provider may recommend certain vaccines, such as:  Influenza vaccine. This is recommended every year.  Tetanus, diphtheria, and acellular pertussis (Tdap, Td) vaccine. You may need a Td  booster every 10 years.  Zoster vaccine. You may need this after age 55.  Pneumococcal 13-valent conjugate (PCV13) vaccine. One dose is recommended after age 47.  Pneumococcal polysaccharide (PPSV23) vaccine. One dose is recommended after age  39. Talk to your health care provider about which screenings and vaccines you need and how often you need them. This information is not intended to replace advice given to you by your health care provider. Make sure you discuss any questions you have with your health care provider. Document Released: 08/12/2015 Document Revised: 04/04/2016 Document Reviewed: 05/17/2015 Elsevier Interactive Patient Education  2017 [employer] Prevention in the Home Falls can cause injuries. They can happen to people of all ages. There are many things you can do to make your home safe and to help prevent falls. What can I do on the outside of my home?  Regularly fix the edges of walkways and driveways and fix any cracks.  Remove anything that might make you trip as you walk through a door, such as a raised step or threshold.  Trim any bushes or trees on the path to your home.  Use bright outdoor lighting.  Clear any walking paths of anything that might make someone trip, such as rocks or tools.  Regularly check to see if handrails are loose or broken. Make sure that both sides of any steps have handrails.  Any raised decks and porches should have guardrails on the edges.  Have any leaves, snow, or ice cleared regularly.  Use sand or salt on walking paths during winter.  Clean up any spills in your garage right away. This includes oil or grease spills. What can I do in the bathroom?  Use night lights.  Install grab bars by the toilet and in the tub and shower. Do not use towel bars as grab bars.  Use non-skid mats or decals in the tub or shower.  If you need to sit down in the shower, use a plastic, non-slip stool.  Keep the floor dry. Clean up any water that spills on the floor as soon as it happens.  Remove soap buildup in the tub or shower regularly.  Attach bath mats securely with double-sided non-slip rug tape.  Do not have throw rugs and other things on the floor that can make  you trip. What can I do in the bedroom?  Use night lights.  Make sure that you have a light by your bed that is easy to reach.  Do not use any sheets or blankets that are too big for your bed. They should not hang down onto the floor.  Have a firm chair that has side arms. You can use this for support while you get dressed.  Do not have throw rugs and other things on the floor that can make you trip. What can I do in the kitchen?  Clean up any spills right away.  Avoid walking on wet floors.  Keep items that you use a lot in easy-to-reach places.  If you need to reach something above you, use a strong step stool that has a grab bar.  Keep electrical cords out of the way.  Do not use floor polish or wax that makes floors slippery. If you must use wax, use non-skid floor wax.  Do not have throw rugs and other things on the floor that can make you trip. What can I do with my stairs?  Do not leave any items on the stairs.  Make sure that there are handrails on both sides of the stairs and use them. Fix handrails that are broken or loose. Make sure that handrails are as long as the stairways.  Check any carpeting to make sure that it is firmly attached to the stairs. Fix any carpet that is loose or worn.  Avoid having throw rugs at the top or bottom of the stairs. If you do have throw rugs, attach them to the floor with carpet tape.  Make sure that you have a light switch at the top of the stairs and the bottom of the stairs. If you do not have them, ask someone to add them for you. What else can I do to help prevent falls?  Wear shoes that:  Do not have high heels.  Have rubber bottoms.  Are comfortable and fit you well.  Are closed at the toe. Do not wear sandals.  If you use a stepladder:  Make sure that it is fully opened. Do not climb a closed stepladder.  Make sure that both sides of the stepladder are locked into place.  Ask someone to hold it for you, if  possible.  Clearly mark and make sure that you can see:  Any grab bars or handrails.  First and last steps.  Where the edge of each step is.  Use tools that help you move around (mobility aids) if they are needed. These include:  Canes.  Walkers.  Scooters.  Crutches.  Turn on the lights when you go into a dark area. Replace any light bulbs as soon as they burn out.  Set up your furniture so you have a clear path. Avoid moving your furniture around.  If any of your floors are uneven, fix them.  If there are any pets around you, be aware of where they are.  Review your medicines with your doctor. Some medicines can make you feel dizzy. This can increase your chance of falling. Ask your doctor what other things that you can do to help prevent falls. This information is not intended to replace advice given to you by your health care provider. Make sure you discuss any questions you have with your health care provider. Document Released: 05/12/2009 Document Revised: 12/22/2015 Document Reviewed: 08/20/2014 Elsevier Interactive Patient Education  2017 [employer].

## 2020-06-08 ENCOUNTER — Encounter: Payer: Self-pay | Admitting: Internal Medicine

## 2020-06-08 ENCOUNTER — Non-Acute Institutional Stay: Payer: Medicare Other | Admitting: Internal Medicine

## 2020-06-08 ENCOUNTER — Other Ambulatory Visit: Payer: Self-pay

## 2020-06-08 VITALS — BP 138/82 | HR 64 | Temp 97.5°F | Ht 64.0 in | Wt 263.0 lb

## 2020-06-08 DIAGNOSIS — R635 Abnormal weight gain: Secondary | ICD-10-CM | POA: Diagnosis not present

## 2020-06-08 DIAGNOSIS — M1712 Unilateral primary osteoarthritis, left knee: Secondary | ICD-10-CM | POA: Diagnosis not present

## 2020-06-08 DIAGNOSIS — T84038A Mechanical loosening of other internal prosthetic joint, initial encounter: Secondary | ICD-10-CM | POA: Diagnosis not present

## 2020-06-08 DIAGNOSIS — R0600 Dyspnea, unspecified: Secondary | ICD-10-CM | POA: Diagnosis not present

## 2020-06-08 DIAGNOSIS — Z96659 Presence of unspecified artificial knee joint: Secondary | ICD-10-CM

## 2020-06-08 DIAGNOSIS — K5904 Chronic idiopathic constipation: Secondary | ICD-10-CM

## 2020-06-08 DIAGNOSIS — R0609 Other forms of dyspnea: Secondary | ICD-10-CM

## 2020-06-08 DIAGNOSIS — I5032 Chronic diastolic (congestive) heart failure: Secondary | ICD-10-CM

## 2020-06-08 NOTE — Progress Notes (Signed)
Location:   [facility] of Service:  Clinic (12)  Provider: Lajuan Godbee L. Mariea Clonts, D.O., C.M.D.  Code Status: DNR Goals of Care:  Advanced Directives 06/08/2020  Does Patient Have a Medical Advance Directive? Yes  Type of Advance Directive Living will  Does patient want to make changes to medical advance directive? No - Patient declined  Copy of [place] in Chart? Yes - validated most recent copy scanned in chart (See row information)  Pre-existing out of facility DNR order (yellow form or pink MOST form) Pink MOST/Yellow Form most recent copy in chart - Physician notified to receive inpatient order     Chief Complaint  Patient presents with  . Acute Visit    Discuss left knee surgery set for December 3rd 2021    HPI: Patient is a 86 y.o. female seen today for an acute visit to be checked prior to a left TKA.   She has a 86 yo left knee replacement. She'd initially gone for an injection.  She does not want to get her knee replaced.  It does hurt each step she takes.  Now she's waiting on insurance authorization--plan is 12/3.  Plans to go to rehab.   She had her preop labs done for Dr. Berenice Primas.  Nuclear study showed:  Increased blood flow and blood pool of tracer at LEFT knee with abnormal increased delayed tracer localization in the proximal tibia, consistent with either aseptic loosening or infection of the  LEFT knee prosthesis.  Her little walk to the bathroom in the middle of the night is excruciating and she's afraid she'll fall.  She parks her car the third from the end, she gets short of breath.  Says she thinks it's because she's fat.  She fell getting off her escalator and her elbow was wide open--required 11 sutures.  Clinic nurse changed her dressing every day and it never got infected.      Past Medical History:  Diagnosis Date  . Arthritis   . Cancer ([place])    basal cell skin cancer  . Concussion   . Depression   . Dyspnea    upon  exertion  . Headache   . Hypertension   . Hypothyroidism   . Stress incontinence   . Total knee replacement status, bilateral     Past Surgical History:  Procedure Laterality Date  . ABDOMINAL HYSTERECTOMY    . APPENDECTOMY    . CARPAL TUNNEL RELEASE     rt x2  . COLONOSCOPY    . EYE SURGERY Bilateral    cataract surgery with lens implant  . FINGER AMPUTATION Right    little finger  . I & D EXTREMITY  03/11/2012   Procedure: MINOR IRRIGATION AND DEBRIDEMENT EXTREMITY;  Surgeon: Cammie Sickle., MD;  Location: [facility];  Service: Orthopedics;  Laterality: Right;  Right little finger  . KNEE ARTHROSCOPY     bilat   . REPLACEMENT TOTAL KNEE BILATERAL    . REVERSE SHOULDER ARTHROPLASTY Right 09/04/2018   Procedure: REVERSE SHOULDER ARTHROPLASTY;  Surgeon: Tania Ade, MD;  Location: [facility];  Service: Orthopedics;  Laterality: Right;  . TONSILLECTOMY    . TOTAL KNEE REVISION Right 03/14/2015   Procedure: TIBIAL REVISION, OPEN REDUCTION INTERNAL FIXATION PROXIMAL TIBIA;  Surgeon: Dorna Leitz, MD;  Location: [facility];  Service: Orthopedics;  Laterality: Right;  . TRIGGER FINGER RELEASE    . TUBAL LIGATION      Allergies  Allergen Reactions  . Phenergan [Promethazine Hcl] Other (See Comments)    hallucinations  . Lisinopril Cough    Outpatient Encounter Medications as of 06/08/2020  Medication Sig  . aspirin EC 81 MG tablet Take 81 mg by mouth daily.  . Calcium Carb-Cholecalciferol (CALCIUM 600/VITAMIN D3 PO) Take 1 tablet by mouth daily.  . Cholecalciferol (VITAMIN D-3) 1000 units CAPS Take 1,000 Units by mouth daily.   . ferrous sulfate 325 (65 FE) MG tablet Take 325 mg by mouth daily with breakfast.   . folic acid (FOLVITE) 353 MCG tablet Take 800 mcg by mouth daily.   Marland Kitchen levothyroxine (SYNTHROID) 125 MCG tablet Take 1 tablet (125 mcg total) by mouth daily before breakfast.  . losartan (COZAAR) 100 MG tablet Take 0.5 tablets (50 mg total) by mouth daily.    . metoprolol tartrate (LOPRESSOR) 25 MG tablet TAKE 1 TABLET BY MOUTH TWICE A DAY WITH FOOD  . Multiple Vitamins-Minerals (PRESERVISION AREDS 2 PO) Take 1 capsule by mouth 2 (two) times daily.  . pravastatin (PRAVACHOL) 80 MG tablet Take 1 tablet (80 mg total) by mouth daily.  . traZODone (DESYREL) 50 MG tablet TAKE 1 TABLET BY MOUTH EVERYDAY AT BEDTIME  . vitamin C (ASCORBIC ACID) 500 MG tablet Take 500 mg by mouth daily.   No facility-administered encounter medications on file as of 06/08/2020.    Review of Systems:  Review of Systems  Constitutional: Negative for chills, fever and malaise/fatigue.       Wt gain  HENT: Negative for congestion and sore throat.   Eyes: Negative for blurred vision.  Respiratory: Negative for cough and shortness of breath.        Dyspnea on exertion  Cardiovascular: Negative for chest pain, palpitations and leg swelling.  Gastrointestinal: Negative for abdominal pain and constipation.       Uses daily stool softener now   Genitourinary: Negative for dysuria.  Musculoskeletal: Positive for joint pain. Negative for back pain and falls.  Neurological: Negative for dizziness and loss of consciousness.  Endo/Heme/Allergies: Bruises/bleeds easily.  Psychiatric/Behavioral: Negative for depression and memory loss. The patient is not nervous/anxious and does not have insomnia.     Health Maintenance  Topic Date Due  . PNA vac Low Risk Adult (2 of 2 - PPSV23) 02/28/2016  . TETANUS/TDAP  02/26/2030  . INFLUENZA VACCINE  Completed  . DEXA SCAN  Completed  . COVID-19 Vaccine  Completed    Physical Exam: Vitals:   06/08/20 1333  BP: 138/82  Pulse: 64  Temp: (!) 97.5 F (36.4 C)  SpO2: 98%  Weight: 263 lb (119.3 kg)  Height: 5\' 4"  (1.626 m)   Body mass index is 45.14 kg/m. Physical Exam Vitals reviewed.  Constitutional:      General: She is not in acute distress.    Appearance: Normal appearance. She is obese. She is not toxic-appearing.  HENT:      Head: Normocephalic and atraumatic.  Eyes:     Extraocular Movements: Extraocular movements intact.     Conjunctiva/sclera: Conjunctivae normal.     Pupils: Pupils are equal, round, and reactive to light.  Cardiovascular:     Rate and Rhythm: Normal rate and regular rhythm.     Pulses: Normal pulses.     Heart sounds: Normal heart sounds. No murmur heard.   Pulmonary:     Effort: Pulmonary effort is normal.     Breath sounds: Normal breath sounds. No wheezing, rhonchi or rales.  Abdominal:  General: Bowel sounds are normal. There is no distension.     Palpations: Abdomen is soft.     Tenderness: There is no abdominal tenderness. There is no guarding or rebound.  Musculoskeletal:        General: Tenderness present. Normal range of motion.     Right lower leg: No edema.     Left lower leg: No edema.     Comments: left knee painful on weightbearing  Skin:    General: Skin is warm and dry.  Neurological:     General: No focal deficit present.     Mental Status: She is alert and oriented to person, place, and time.     Motor: No weakness.     Gait: Gait normal.  Psychiatric:        Mood and Affect: Mood normal.        Behavior: Behavior normal.        Thought Content: Thought content normal.        Judgment: Judgment normal.     Comments: Crying some about loss of her dog of 13 yrs (had chf)     Labs reviewed: Basic Metabolic Panel: Recent Labs    08/11/19 0000 08/11/19 0500 11/12/19 0000 11/12/19 0300 12/30/19 1127 02/02/20 0000  NA  --    < > 132*  --  137 138  K  --    < > 4.2  --  4.4 4.6  CL  --    < > 97*  --  104 102  CO2  --    < > 25*  --  32 24*  GLUCOSE  --   --   --   --  116*  --   BUN  --    < > 12  --  16 18  CREATININE  --    < > 0.8  --  0.88 0.7  CALCIUM  --    < >  --  9.2 9.4 9.5  TSH 0.08*  --   --   --   --   --    < > = values in this interval not displayed.   Liver Function Tests: Recent Labs    08/11/19 0500 11/12/19 0300   AST  --  17  ALT  --  14  ALBUMIN 3.9 3.6   No results for input(s): LIPASE, AMYLASE in the last 8760 hours. No results for input(s): AMMONIA in the last 8760 hours. CBC: Recent Labs    08/11/19 0000 11/12/19 0000 02/02/20 0000  WBC 5.3 7.6 5.0  HGB 13.4 12.5 12.7  HCT 39 38 38  PLT 251 384 256   Lipid Panel: Recent Labs    08/11/19 0500  HDL 49  LDLCALC 88  TRIG 123   Lab Results  Component Value Date   HGBA1C 5.5 06/16/2014    Procedures since last visit: NM Bone Scan 3 Phase Lower Extremity  Result Date: 05/30/2020 CLINICAL DATA:  LEFT knee pain of unspecified chronicity, BILATERAL knee replacement surgery in past 20+ years ago with revision of RIGHT knee in 2016 EXAM: NUCLEAR MEDICINE 3-PHASE BONE SCAN TECHNIQUE: Radionuclide angiographic images, immediate static blood pool images, and 3-hour delayed static images were obtained of the knees after intravenous injection of radiopharmaceutical. RADIOPHARMACEUTICALS:  20.3 mCi Tc-67m MDP IV COMPARISON:  02/23/2015 FINDINGS: Vascular phase: Increased blood flow to periarticular region of LEFT knee. Normal blood flow RIGHT knee. Blood pool phase: Increased blood pool surrounding LEFT knee joint. Normal  blood pool RIGHT knee. Delayed phase: BILATERAL knee prostheses with photopenic defects. Abnormal increased tracer uptake at proximal LEFT tibia adjacent to the tibial component of the prosthesis extending into metaphysis. This could reflect aseptic loosening or infection of the LEFT knee prosthesis. No significant abnormal tracer uptake adjacent to RIGHT knee prosthesis. IMPRESSION: Increased blood flow and blood pool of tracer at LEFT knee with abnormal increased delayed tracer localization in the proximal tibia, consistent with either aseptic loosening or infection of the LEFT knee prosthesis. No scintigraphic complications of the RIGHT knee prosthesis are identified. Electronically Signed   By: Lavonia Dana M.D.   On: 05/30/2020  15:22    Assessment/Plan 1. Mechanical loosening of prosthetic knee, initial encounter ([place]) -for left total knee arthroplasty next month  2. Primary osteoarthritis of left knee -had prior TKA and now loose  3. Dyspnea on exertion -will consult with Dr. Acie Fredrickson about whether stress test recommended preop for her; however, she's not excited about doing that or any delay to her surgery  4. Weight gain -amid covid and decreased activity with pain  5. Morbid (severe) obesity due to excess calories ([place]) -has gained weight amid covid and now with declining function of knee and pain -hopes she'll lose when she has surgery like she did before  -deconditioning may be reason for her DOE, but want cardio opinion that she does not need a stress test  6. Chronic diastolic congestive heart failure ([facility]) -does not have signs of hypervolemia, cont current mgt  7. Chronic idiopathic constipation -cont daily stool softener  Labs/tests ordered:  Had labs already for preop clearance per ortho though I can't see them   Next appt:  08/03/2020  CC:  Dr. Acie Fredrickson  Kong Packett L. Rakiyah Esch, D.O. [facility] [address], [place] [address] Cell Phone (Mon-Fri 8am-5pm):  [phone] On Call:  [phone] & follow prompts after 5pm & weekends Office Phone:  [phone] Office Fax:  [phone]

## 2020-06-10 ENCOUNTER — Other Ambulatory Visit: Payer: Self-pay | Admitting: Orthopedic Surgery

## 2020-06-16 NOTE — Patient Instructions (Addendum)
DUE TO COVID-19 ONLY ONE VISITOR IS ALLOWED TO COME WITH YOU AND STAY IN THE WAITING ROOM ONLY DURING PRE OP AND PROCEDURE DAY OF SURGERY. THE 1 VISITOR  MAY VISIT WITH YOU AFTER SURGERY IN YOUR PRIVATE ROOM DURING VISITING HOURS ONLY!  YOU NEED TO HAVE A COVID 19 TEST ON_11/30______ @_2 :00______, THIS TEST MUST BE DONE BEFORE SURGERY,  COVID TESTING SITE [place] [address], IT IS ON THE RIGHT GOING OUT [address] APPROXIMATELY  2 MINUTES PAST [employer] ON THE RIGHT. ONCE YOUR COVID TEST IS COMPLETED,  PLEASE BEGIN THE QUARANTINE INSTRUCTIONS AS OUTLINED IN YOUR HANDOUT.                DONNALEE CELLUCCI    Your procedure is scheduled on: 07/01/20   Report to [facility] Main  Entrance   Report to admitting at  11:30 AM     Call this number if you have problems the morning of surgery Power, NO CHEWING GUM [place].    No food after midnight.    You may have clear liquid until 10:00 AM.    CLEAR LIQUID DIET   Foods Allowed                                                                     Foods Excluded  Coffee and tea, regular and decaf                             liquids that you cannot  Plain Jell-O any favor except red or purple                                           see through such as: Fruit ices (not with fruit pulp)                                     milk, soups, orange juice  Iced Popsicles                                    All solid food Carbonated beverages, regular and diet                                    Cranberry, grape and apple juices Sports drinks like Gatorade Lightly seasoned clear broth or consume(fat free) Sugar, honey syrup       At 10:00 AM drink pre surgery drink  . Nothing by mouth after 10:00 AM.   Take these medicines the morning of surgery with A SIP OF WATER: Metoprolol, Synthroid  You may not have any metal on your body including hair pins and              piercings  Do not wear jewelry, make-up, lotions, powders or perfumes, deodorant             Do not wear nail polish on your fingernails.  Do not shave  48 hours prior to surgery.                Do not bring valuables to the hospital. [place].  Contacts, dentures or bridgework may not be worn into surgery.       Patients discharged the day of surgery will not be allowed to drive home.   IF YOU ARE HAVING SURGERY AND GOING HOME THE SAME DAY, YOU MUST HAVE AN ADULT TO DRIVE YOU HOME AND BE WITH YOU FOR 24 HOURS.  YOU MAY GO HOME BY TAXI OR UBER OR ORTHERWISE, BUT AN ADULT MUST ACCOMPANY YOU HOME AND STAY WITH YOU FOR 24 HOURS.  Name and phone number of your driver:  Special Instructions: N/A              Please read over the following fact sheets you were given: _____________________________________________________________________             [facility] - Preparing for Surgery Before surgery, you can play an important role.   Because skin is not sterile, your skin needs to be as free of germs as possible.   You can reduce the number of germs on your skin by washing with CHG (chlorahexidine gluconate) soap before surgery .  CHG is an antiseptic cleaner which kills germs and bonds with the skin to continue killing germs even after washing. Please DO NOT use if you have an allergy to CHG or antibacterial soaps.   If your skin becomes reddened/irritated stop using the CHG and inform your nurse when you arrive at Short Stay. Do not shave (including legs and underarms) for at least 48 hours prior to the first CHG shower.    Please follow these instructions carefully:  1.  Shower with CHG Soap the night before surgery and the  morning of Surgery.  2.  If you choose to wash your hair, wash your hair first as usual with your  normal  shampoo.  3.  After you  shampoo, rinse your hair and body thoroughly to remove the  shampoo.                                        4.  Use CHG as you would any other liquid soap.  You can apply chg directly  to the skin and wash                       Gently with a scrungie or clean washcloth.  5.  Apply the CHG Soap to your body ONLY FROM THE NECK DOWN.   Do not use on face/ open                           Wound or open sores. Avoid contact with eyes, ears mouth and genitals (private parts).  Wash face,  Genitals (private parts) with your normal soap.             6.  Wash thoroughly, paying special attention to the area where your surgery  will be performed.  7.  Thoroughly rinse your body with warm water from the neck down.  8.  DO NOT shower/wash with your normal soap after using and rinsing off  the CHG Soap.             9.  Pat yourself dry with a clean towel.            10.  Wear clean pajamas.            11.  Place clean sheets on your bed the night of your first shower and do not  sleep with pets. Day of Surgery : Do not apply any lotions/deodorants the morning of surgery.  Please wear clean clothes to the hospital/surgery center.  FAILURE TO FOLLOW THESE INSTRUCTIONS MAY RESULT IN THE CANCELLATION OF YOUR SURGERY PATIENT SIGNATURE_________________________________  NURSE SIGNATURE__________________________________  ________________________________________________________________________

## 2020-06-20 ENCOUNTER — Ambulatory Visit (HOSPITAL_COMMUNITY)
Admission: RE | Admit: 2020-06-20 | Discharge: 2020-06-20 | Disposition: A | Discharge status: Home / Self Care | Payer: Medicare Other | Source: Ambulatory Visit | Attending: Orthopedic Surgery | Admitting: Orthopedic Surgery

## 2020-06-20 ENCOUNTER — Encounter (HOSPITAL_COMMUNITY)
Admission: RE | Admit: 2020-06-20 | Discharge: 2020-06-20 | Disposition: A | Discharge status: Home / Self Care | Payer: Medicare Other | Source: Ambulatory Visit | Attending: Orthopedic Surgery | Admitting: Orthopedic Surgery

## 2020-06-20 ENCOUNTER — Encounter (HOSPITAL_COMMUNITY): Payer: Self-pay

## 2020-06-20 ENCOUNTER — Other Ambulatory Visit: Payer: Self-pay

## 2020-06-20 DIAGNOSIS — I517 Cardiomegaly: Secondary | ICD-10-CM | POA: Diagnosis not present

## 2020-06-20 DIAGNOSIS — Z01818 Encounter for other preprocedural examination: Secondary | ICD-10-CM | POA: Diagnosis not present

## 2020-06-20 DIAGNOSIS — Z01811 Encounter for preprocedural respiratory examination: Secondary | ICD-10-CM | POA: Diagnosis not present

## 2020-06-20 LAB — BASIC METABOLIC PANEL
Anion gap: 7 (ref 5–15)
BUN: 18 mg/dL (ref 8–23)
CO2: 29 mmol/L (ref 22–32)
Calcium: 9.4 mg/dL (ref 8.9–10.3)
Chloride: 101 mmol/L (ref 98–111)
Creatinine, Ser: 0.9 mg/dL (ref 0.44–1.00)
GFR, Estimated: >60 mL/min (ref >60)
Glucose, Bld: 108 mg/dL — ABNORMAL HIGH (ref 70–99)
Potassium: 5.4 mmol/L — ABNORMAL HIGH (ref 3.5–5.1)
Sodium: 137 mmol/L (ref 135–145)

## 2020-06-20 LAB — CBC
HCT: 40.1 % (ref 36.0–46.0)
Hemoglobin: 12.9 g/dL (ref 12.0–15.0)
MCH: 29.1 pg (ref 26.0–34.0)
MCHC: 32.2 g/dL (ref 30.0–36.0)
MCV: 90.3 fL (ref 80.0–100.0)
Platelets: 273 10*3/uL (ref 150–400)
RBC: 4.44 MIL/uL (ref 3.87–5.11)
RDW: 14.2 % (ref 11.5–15.5)
WBC: 6.2 10*3/uL (ref 4.0–10.5)
nRBC: 0 % (ref 0.0–0.2)

## 2020-06-20 LAB — URINALYSIS, ROUTINE W REFLEX MICROSCOPIC
Bilirubin Urine: NEGATIVE
Glucose, UA: NEGATIVE mg/dL
Hgb urine dipstick: NEGATIVE
Ketones, ur: NEGATIVE mg/dL
Nitrite: NEGATIVE
Protein, ur: NEGATIVE mg/dL
Specific Gravity, Urine: 1.012 (ref 1.005–1.030)
WBC, UA: >50 WBC/hpf — ABNORMAL HIGH (ref 0–5)
pH: 8 (ref 5.0–8.0)

## 2020-06-20 LAB — SURGICAL PCR SCREEN
MRSA, PCR: NEGATIVE
Staphylococcus aureus: NEGATIVE

## 2020-06-20 LAB — TYPE AND SCREEN
ABO/RH(D): O POS
Antibody Screen: NEGATIVE

## 2020-06-20 NOTE — Progress Notes (Signed)
COVID Vaccine Completed:Yes Date COVID Vaccine completed:09/08/19- booster 06/14/20 COVID vaccine manufacturer:  Moderna      PCP - Dr. Mariea Clonts Cardiologist -Dr. Rae Halsted   Chest x-ray - 06/20/20 EKG - 06/10/20 Stress Test - no ECHO - no Cardiac Cath - no Pacemaker/ICD device last checked:NA  Sleep Study - no CPAP -   Fasting Blood Sugar - NA Checks Blood Sugar _____ times a day  Blood Thinner Instructions:ASA/ Nasher Aspirin Instructions:stop 5 days/ Dr. Berenice Primas Last Dose:06/26/20  Anesthesia review:   Patient denies shortness of breath, fever, cough and chest pain at PAT appointment yes   Patient verbalized understanding of instructions that were given to them at the PAT appointment. Patient was also instructed that they will need to review over the PAT instructions again at home before surgery. Yes  Pt states that she is over weight and out of shape . She gets SOB walking and distance and was slightly out of breath after using the restroom but reports none with ADLs

## 2020-06-28 ENCOUNTER — Other Ambulatory Visit (HOSPITAL_COMMUNITY)
Admission: RE | Admit: 2020-06-28 | Discharge: 2020-06-28 | Disposition: A | Discharge status: Home / Self Care | Payer: Medicare Other | Source: Ambulatory Visit | Attending: Orthopedic Surgery | Admitting: Orthopedic Surgery

## 2020-06-28 DIAGNOSIS — Z01812 Encounter for preprocedural laboratory examination: Secondary | ICD-10-CM | POA: Diagnosis not present

## 2020-06-28 DIAGNOSIS — Z20822 Contact with and (suspected) exposure to covid-19: Secondary | ICD-10-CM | POA: Insufficient documentation

## 2020-06-28 LAB — SARS CORONAVIRUS 2 (TAT 6-24 HRS): SARS Coronavirus 2: NEGATIVE

## 2020-06-30 MED ORDER — BUPIVACAINE LIPOSOME 1.3 % IJ SUSP
20.0000 mL | Freq: Once | INTRAMUSCULAR | Status: DC
  Filled 2020-06-30: qty 20

## 2020-06-30 NOTE — H&P (Signed)
TOTAL KNEE REVISION ADMISSION H&P  Patient is being admitted for left revision total knee arthroplasty.  Subjective:  Chief Complaint:left knee pain.  HPI: Samantha Stone, 86 y.o. female, has a history of pain and functional disability in the left knee(s) due to failed previous arthroplasty and patient has failed non-surgical conservative treatments for greater than 12 weeks to include NSAID's and/or analgesics, viscosupplementation injections and activity modification. The indications for the revision of the total knee arthroplasty are loosening of one or more components. Onset of symptoms was gradual starting 2 years ago with gradually worsening course since that time.  Prior procedures on the left knee(s) include arthroplasty.  Patient currently rates pain in the left knee(s) at 8 out of 10 with activity. There is night pain, worsening of pain with activity and weight bearing, pain that interferes with activities of daily living, pain with passive range of motion and joint swelling.  Patient has evidence of prosthetic loosening by imaging studies. This condition presents safety issues increasing the risk of falls. This patient has had failure of all reasonable conservative care and imaging showing failure of mobile bearing total knee replacement..  There is no current active infection.  Patient Active Problem List   Diagnosis Date Noted  . Continuous leakage of urine 10/15/2018  . Early dry stage nonexudative age-related macular degeneration of both eyes 10/15/2018  . Presbycusis of both ears 10/15/2018  . S/P reverse total shoulder arthroplasty, right 10/15/2018  . Degenerative arthritis of right shoulder region 09/04/2018  . Morbid (severe) obesity due to excess calories ([place]) 08/28/2016  . Dyspnea on exertion 08/27/2016  . Cough variant asthma 08/27/2016  . Chronic diastolic congestive heart failure ([place]) 05/07/2016  . Postoperative anemia due to acute blood loss 03/17/2015  . Aseptic  loosening of prosthetic knee ([facility]) 03/14/2015  . 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 Confusion 06/16/2014  . Acute encephalopathy 06/16/2014  . HLD (hyperlipidemia) 06/16/2014  . Hypothyroidism 06/16/2014  . Frontal headache   . Aphasia 10/23/2013  . Essential hypertension 10/23/2013   Past Medical History:  Diagnosis Date  . Arthritis   . Cancer ([place])    basal cell skin cancer  . Concussion   . Dyspnea    upon exertion  . Hypertension   . Hypothyroidism   . Stress incontinence   . Total knee replacement status, bilateral     Past Surgical History:  Procedure Laterality Date  . ABDOMINAL HYSTERECTOMY    . APPENDECTOMY    . CARPAL TUNNEL RELEASE     rt x2  . COLONOSCOPY    . EYE SURGERY Bilateral    cataract surgery with lens implant  . FINGER AMPUTATION Right    little finger  . I & D EXTREMITY  03/11/2012   Procedure: MINOR IRRIGATION AND DEBRIDEMENT EXTREMITY;  Surgeon: Cammie Sickle., MD;  Location: [facility];  Service: Orthopedics;  Laterality: Right;  Right little finger  . KNEE ARTHROSCOPY     bilat   . REPLACEMENT TOTAL KNEE BILATERAL    . REVERSE SHOULDER ARTHROPLASTY Right 09/04/2018   Procedure: REVERSE SHOULDER ARTHROPLASTY;  Surgeon: Tania Ade, MD;  Location: [facility];  Service: Orthopedics;  Laterality: Right;  . TONSILLECTOMY    . TOTAL KNEE REVISION Right 03/14/2015   Procedure: TIBIAL REVISION, OPEN REDUCTION INTERNAL FIXATION PROXIMAL TIBIA;  Surgeon: Dorna Leitz, MD;  Location: [facility];  Service: Orthopedics;  Laterality: Right;  .  TRIGGER FINGER RELEASE    . TUBAL LIGATION      Current Facility-Administered Medications  Medication Dose Route Frequency Provider Last Rate Last Admin  . [START ON 07/01/2020] bupivacaine liposome (EXPAREL) 1.3 % injection 266 mg  20 mL Other Once Dorna Leitz, MD       Current Outpatient Medications  Medication Sig  Dispense Refill Last Dose  . acetaminophen (TYLENOL) 500 MG tablet Take 1,000 mg by mouth every 6 (six) hours as needed for moderate pain.     Marland Kitchen aspirin EC 81 MG tablet Take 81 mg by mouth daily.     . Calcium Carb-Cholecalciferol (CALCIUM 600/VITAMIN D3 PO) Take 1 tablet by mouth daily.     . Cholecalciferol (VITAMIN D-3) 1000 units CAPS Take 1,000 Units by mouth daily.      Marland Kitchen docusate sodium (COLACE) 100 MG capsule Take 100 mg by mouth 2 (two) times daily.     . ferrous sulfate 325 (65 FE) MG tablet Take 325 mg by mouth daily with breakfast.      . folic acid (FOLVITE) 850 MCG tablet Take 800 mcg by mouth daily.      Marland Kitchen levothyroxine (SYNTHROID) 125 MCG tablet Take 1 tablet (125 mcg total) by mouth daily before breakfast. 90 tablet 3   . losartan (COZAAR) 100 MG tablet Take 0.5 tablets (50 mg total) by mouth daily. 45 tablet 3   . metoprolol tartrate (LOPRESSOR) 25 MG tablet TAKE 1 TABLET BY MOUTH TWICE A DAY WITH FOOD (Patient taking differently: Take 25 mg by mouth 2 (two) times daily with a meal. ) 180 tablet 1   . Multiple Vitamins-Minerals (PRESERVISION AREDS 2 PO) Take 1 capsule by mouth 2 (two) times daily.     . pravastatin (PRAVACHOL) 80 MG tablet Take 1 tablet (80 mg total) by mouth daily. 90 tablet 3   . traZODone (DESYREL) 50 MG tablet TAKE 1 TABLET BY MOUTH EVERYDAY AT BEDTIME (Patient taking differently: Take 50 mg by mouth at bedtime. ) 90 tablet 3   . vitamin C (ASCORBIC ACID) 500 MG tablet Take 500 mg by mouth daily.      Allergies  Allergen Reactions  . Phenergan [Promethazine Hcl] Other (See Comments)    hallucinations  . Lisinopril Cough    Social History   Tobacco Use  . Smoking status: Never Smoker  . Smokeless tobacco: Never Used  Substance Use Topics  . Alcohol use: Yes    Alcohol/week: 14.0 standard drinks    Types: 14 Cans of beer per week    Comment: "couple" beers daily    Family History  Problem Relation Age of Onset  . Bladder Cancer Father        Review of Systems  ROS: I have reviewed the patient's review of systems thoroughly and there are no positive responses as relates to the HPI. Objective:  Physical Exam  Vital signs in last 24 hours:   Well-developed well-nourished patient in no acute distress. Alert and oriented x3 HEENT:within normal limits Cardiac: Regular rate and rhythm Pulmonary: Lungs clear to auscultation Abdomen: Soft and nontender.  Normal active bowel sounds  Musculoskeletal: left knee: Painful range of motion.  Limited range of motion.  1+ effusion.  No instability. Labs: Recent Results (from the past 2160 hour(s))  Surgical pcr screen     Status: None   Collection Time: 06/20/20 10:14 AM   Specimen: Nasal Mucosa; Nasal Swab  Result Value Ref Range   MRSA, PCR NEGATIVE NEGATIVE  Staphylococcus aureus NEGATIVE NEGATIVE    Comment: (NOTE) The Xpert SA Assay (FDA approved for NASAL specimens in patients 70 years of age and older), is one component of a comprehensive surveillance program. It is not intended to diagnose infection nor to guide or monitor treatment. Performed at [facility], [place] [address] [place], [place] [address]   Urinalysis, Routine w reflex microscopic     Status: Abnormal   Collection Time: 06/20/20 10:14 AM  Result Value Ref Range   Color, Urine YELLOW YELLOW   APPearance CLEAR CLEAR   Specific Gravity, Urine 1.012 1.005 - 1.030   pH 8.0 5.0 - 8.0   Glucose, UA NEGATIVE NEGATIVE mg/dL   Hgb urine dipstick NEGATIVE NEGATIVE   Bilirubin Urine NEGATIVE NEGATIVE   Ketones, ur NEGATIVE NEGATIVE mg/dL   Protein, ur NEGATIVE NEGATIVE mg/dL   Nitrite NEGATIVE NEGATIVE   Leukocytes,Ua SMALL (A) NEGATIVE   RBC / HPF 0-5 0 - 5 RBC/hpf   WBC, UA >50 (H) 0 - 5 WBC/hpf   Bacteria, UA RARE (A) NONE SEEN   Squamous Epithelial / LPF 0-5 0 - 5    Comment: Performed at [facility], [place] [address] [place], [place] [address]  Basic metabolic panel  per protocol     Status: Abnormal   Collection Time: 06/20/20 11:43 AM  Result Value Ref Range   Sodium 137 135 - 145 mmol/L   Potassium 5.4 (H) 3.5 - 5.1 mmol/L   Chloride 101 98 - 111 mmol/L   CO2 29 22 - 32 mmol/L   Glucose, Bld 108 (H) 70 - 99 mg/dL    Comment: Glucose reference range applies only to samples taken after fasting for at least 8 hours.   BUN 18 8 - 23 mg/dL   Creatinine, Ser 0.90 0.44 - 1.00 mg/dL   Calcium 9.4 8.9 - 10.3 mg/dL   GFR, Estimated >60 >60 mL/min    Comment: (NOTE) Calculated using the CKD-EPI Creatinine Equation (2021)    Anion gap 7 5 - 15    Comment: Performed at [facility], [place] [address], [place], [place] [address]  Type and screen     Status: None   Collection Time: 06/20/20 11:43 AM  Result Value Ref Range   ABO/RH(D) O POS    Antibody Screen NEG    Sample Expiration 07/04/2020,2359    Extend sample reason      NO TRANSFUSIONS OR PREGNANCY IN THE PAST 3 MONTHS Performed at [place] [address], [place],  [address]   CBC     Status: None   Collection Time: 06/20/20 11:43 AM  Result Value Ref Range   WBC 6.2 4.0 - 10.5 K/uL   RBC 4.44 3.87 - 5.11 MIL/uL   Hemoglobin 12.9 12.0 - 15.0 g/dL   HCT 40.1 36 - 46 %   MCV 90.3 80.0 - 100.0 fL   MCH 29.1 26.0 - 34.0 pg   MCHC 32.2 30.0 - 36.0 g/dL   RDW 14.2 11.5 - 15.5 %   Platelets 273 150 - 400 K/uL   nRBC 0.0 0.0 - 0.2 %    Comment: Performed at [facility], [place] [address], [place], [place] [address]  SARS CORONAVIRUS 2 (TAT 6-24 HRS) Nasopharyngeal Nasopharyngeal Swab     Status: None   Collection Time: 06/28/20  1:49 PM   Specimen: Nasopharyngeal Swab  Result Value Ref Range   SARS Coronavirus 2 NEGATIVE NEGATIVE  Comment: (NOTE) SARS-CoV-2 target nucleic acids are NOT DETECTED.  The SARS-CoV-2 RNA is generally detectable in upper and lower respiratory specimens during the acute phase of infection.  Negative results do not preclude SARS-CoV-2 infection, do not rule out co-infections with other pathogens, and should not be used as the sole basis for treatment or other patient management decisions. Negative results must be combined with clinical observations, patient history, and epidemiological information. The expected result is Negative.  Fact Sheet for Patients: SugarRoll.be  Fact Sheet for Healthcare Providers: https://www.woods-mathews.com/  This test is not yet approved or cleared by the [place] FDA and  has been authorized for detection and/or diagnosis of SARS-CoV-2 by FDA under an Emergency Use Authorization (EUA). This EUA will remain  in effect (meaning this test can be used) for the duration of the COVID-19 declaration under Se ction 564(b)(1) of the Act, 21 U.S.C. section 360bbb-3(b)(1), unless the authorization is terminated or revoked sooner.  Performed at [facility], [place] [address], [place], [place] [address]    Estimated body mass index is 45.32 kg/m as calculated from the following:   Height as of 06/20/20: 5\' 4"  (1.626 m).   Weight as of 06/20/20: 119.7 kg.  Imaging Review Plain radiographs demonstrate severe degenerative joint disease of the left knee(s). The overall alignment is mild varus.There is evidence of loosening of the tibial components. The bone quality appears to be fair for age and reported activity level. There is definite loosening of the tibial component.    Assessment/Plan:  End stage arthritis, left knee(s) with failed previous arthroplasty.   The patient history, physical examination, clinical judgment of the provider and imaging studies are consistent with end stage degenerative joint disease of the left knee(s), previous total knee arthroplasty. Revision total knee arthroplasty is deemed medically necessary. The treatment options including medical management, injection therapy,  arthroscopy and revision arthroplasty were discussed at length. The risks and benefits of revision total knee arthroplasty were presented and reviewed. The risks due to aseptic loosening, infection, stiffness, patella tracking problems, thromboembolic complications and other imponderables were discussed. The patient acknowledged the explanation, agreed to proceed with the plan and consent was signed. Patient is being admitted for inpatient treatment for surgery, pain control, PT, OT, prophylactic antibiotics, VTE prophylaxis, progressive ambulation and ADL's and discharge planning.The patient is planning to be discharged home with home health services

## 2020-06-30 NOTE — Progress Notes (Signed)
Samantha Stone made aware to arrive at 8:30 AM 07/01/20 and drink pre surgery drink at 8:00 AM, she verbalized understanding.

## 2020-07-01 ENCOUNTER — Observation Stay (HOSPITAL_COMMUNITY)
Admission: RE | Admit: 2020-07-01 | Discharge: 2020-07-02 | Disposition: A | Discharge status: Home / Self Care | Payer: Medicare Other | Attending: Orthopedic Surgery | Admitting: Orthopedic Surgery

## 2020-07-01 ENCOUNTER — Ambulatory Visit (HOSPITAL_COMMUNITY): Payer: Medicare Other | Admitting: Certified Registered"

## 2020-07-01 ENCOUNTER — Encounter (HOSPITAL_COMMUNITY): Payer: Self-pay | Admitting: Orthopedic Surgery

## 2020-07-01 ENCOUNTER — Other Ambulatory Visit: Payer: Self-pay

## 2020-07-01 ENCOUNTER — Encounter (HOSPITAL_COMMUNITY)
Admission: RE | Disposition: A | Discharge status: Home / Self Care | Payer: Self-pay | Source: Home / Self Care | Attending: Orthopedic Surgery

## 2020-07-01 DIAGNOSIS — M1712 Unilateral primary osteoarthritis, left knee: Secondary | ICD-10-CM | POA: Diagnosis not present

## 2020-07-01 DIAGNOSIS — I5032 Chronic diastolic (congestive) heart failure: Secondary | ICD-10-CM | POA: Insufficient documentation

## 2020-07-01 DIAGNOSIS — T84093A Other mechanical complication of internal left knee prosthesis, initial encounter: Secondary | ICD-10-CM

## 2020-07-01 DIAGNOSIS — Z85828 Personal history of other malignant neoplasm of skin: Secondary | ICD-10-CM | POA: Diagnosis not present

## 2020-07-01 DIAGNOSIS — I10 Essential (primary) hypertension: Secondary | ICD-10-CM | POA: Diagnosis not present

## 2020-07-01 DIAGNOSIS — Z79899 Other long term (current) drug therapy: Secondary | ICD-10-CM | POA: Insufficient documentation

## 2020-07-01 DIAGNOSIS — I110 Hypertensive heart disease with heart failure: Secondary | ICD-10-CM | POA: Insufficient documentation

## 2020-07-01 DIAGNOSIS — M25562 Pain in left knee: Secondary | ICD-10-CM | POA: Diagnosis present

## 2020-07-01 DIAGNOSIS — G8918 Other acute postprocedural pain: Secondary | ICD-10-CM | POA: Diagnosis not present

## 2020-07-01 DIAGNOSIS — Z96652 Presence of left artificial knee joint: Secondary | ICD-10-CM | POA: Diagnosis not present

## 2020-07-01 DIAGNOSIS — T8484XA Pain due to internal orthopedic prosthetic devices, implants and grafts, initial encounter: Secondary | ICD-10-CM | POA: Diagnosis not present

## 2020-07-01 DIAGNOSIS — Z7982 Long term (current) use of aspirin: Secondary | ICD-10-CM | POA: Diagnosis not present

## 2020-07-01 DIAGNOSIS — Z96653 Presence of artificial knee joint, bilateral: Secondary | ICD-10-CM | POA: Diagnosis not present

## 2020-07-01 DIAGNOSIS — E039 Hypothyroidism, unspecified: Secondary | ICD-10-CM | POA: Diagnosis not present

## 2020-07-01 HISTORY — PX: TOTAL KNEE REVISION: SHX996

## 2020-07-01 LAB — TYPE AND SCREEN
ABO/RH(D): O POS
Antibody Screen: NEGATIVE

## 2020-07-01 SURGERY — TOTAL KNEE REVISION
Anesthesia: Monitor Anesthesia Care | Site: Knee | Laterality: Left

## 2020-07-01 MED ORDER — CHLORHEXIDINE GLUCONATE 0.12 % MT SOLN
15.0000 mL | Freq: Once | OROMUCOSAL | Status: AC
  Administered 2020-07-01: 15 mL via OROMUCOSAL

## 2020-07-01 MED ORDER — BISACODYL 5 MG PO TBEC
5.0000 mg | DELAYED_RELEASE_TABLET | Freq: Every day | ORAL | Status: DC | PRN
  Administered 2020-07-01: 5 mg via ORAL
  Filled 2020-07-01: qty 1

## 2020-07-01 MED ORDER — OXYCODONE HCL 5 MG PO TABS
10.0000 mg | ORAL_TABLET | ORAL | Status: DC | PRN
  Administered 2020-07-01: 10 mg via ORAL
  Administered 2020-07-02 (×2): 15 mg via ORAL
  Filled 2020-07-01 (×2): qty 3
  Filled 2020-07-01: qty 2

## 2020-07-01 MED ORDER — DIPHENHYDRAMINE HCL 12.5 MG/5ML PO ELIX
12.5000 mg | ORAL_SOLUTION | ORAL | Status: DC | PRN
  Administered 2020-07-02 (×2): 25 mg via ORAL
  Filled 2020-07-01 (×2): qty 10

## 2020-07-01 MED ORDER — ONDANSETRON HCL 4 MG/2ML IJ SOLN: 4.0000 mg | Freq: Once | INTRAMUSCULAR | Status: DC | PRN

## 2020-07-01 MED ORDER — PHENYLEPHRINE 40 MCG/ML (10ML) SYRINGE FOR IV PUSH (FOR BLOOD PRESSURE SUPPORT)
PREFILLED_SYRINGE | INTRAVENOUS | Status: DC | PRN
  Administered 2020-07-01: 80 ug via INTRAVENOUS

## 2020-07-01 MED ORDER — MAGNESIUM CITRATE PO SOLN: 1.0000 | Freq: Once | ORAL | Status: DC | PRN

## 2020-07-01 MED ORDER — PHENYLEPHRINE HCL-NACL 10-0.9 MG/250ML-% IV SOLN
INTRAVENOUS | Status: DC | PRN
  Administered 2020-07-01: 20 ug/min via INTRAVENOUS

## 2020-07-01 MED ORDER — CEFAZOLIN SODIUM-DEXTROSE 2-4 GM/100ML-% IV SOLN
2.0000 g | Freq: Four times a day (QID) | INTRAVENOUS | Status: AC
  Administered 2020-07-01 (×2): 2 g via INTRAVENOUS
  Filled 2020-07-01 (×2): qty 100

## 2020-07-01 MED ORDER — DEXAMETHASONE SODIUM PHOSPHATE 10 MG/ML IJ SOLN
INTRAMUSCULAR | Status: AC
  Filled 2020-07-01: qty 1

## 2020-07-01 MED ORDER — METOCLOPRAMIDE HCL 5 MG PO TABS: 5.0000 mg | ORAL_TABLET | Freq: Three times a day (TID) | ORAL | Status: DC | PRN

## 2020-07-01 MED ORDER — DEXAMETHASONE SODIUM PHOSPHATE 10 MG/ML IJ SOLN
10.0000 mg | Freq: Two times a day (BID) | INTRAMUSCULAR | Status: DC
  Administered 2020-07-02: 10 mg via INTRAVENOUS
  Filled 2020-07-01: qty 1

## 2020-07-01 MED ORDER — PHENYLEPHRINE 40 MCG/ML (10ML) SYRINGE FOR IV PUSH (FOR BLOOD PRESSURE SUPPORT)
PREFILLED_SYRINGE | INTRAVENOUS | Status: AC
  Filled 2020-07-01: qty 10

## 2020-07-01 MED ORDER — ALUM & MAG HYDROXIDE-SIMETH 200-200-20 MG/5ML PO SUSP: 30.0000 mL | ORAL | Status: DC | PRN

## 2020-07-01 MED ORDER — PROPOFOL 500 MG/50ML IV EMUL
INTRAVENOUS | Status: DC | PRN
  Administered 2020-07-01: 75 ug/kg/min via INTRAVENOUS

## 2020-07-01 MED ORDER — EPHEDRINE SULFATE-NACL 50-0.9 MG/10ML-% IV SOSY
PREFILLED_SYRINGE | INTRAVENOUS | Status: DC | PRN
  Administered 2020-07-01: 5 mg via INTRAVENOUS

## 2020-07-01 MED ORDER — METOCLOPRAMIDE HCL 5 MG/ML IJ SOLN: 5.0000 mg | Freq: Three times a day (TID) | INTRAMUSCULAR | Status: DC | PRN

## 2020-07-01 MED ORDER — ORAL CARE MOUTH RINSE: 15.0000 mL | Freq: Once | OROMUCOSAL | Status: AC

## 2020-07-01 MED ORDER — CEFAZOLIN SODIUM-DEXTROSE 2-4 GM/100ML-% IV SOLN
2.0000 g | INTRAVENOUS | Status: AC
  Administered 2020-07-01: 2 g via INTRAVENOUS
  Filled 2020-07-01: qty 100

## 2020-07-01 MED ORDER — TRANEXAMIC ACID-NACL 1000-0.7 MG/100ML-% IV SOLN
1000.0000 mg | Freq: Once | INTRAVENOUS | Status: AC
  Administered 2020-07-01: 1000 mg via INTRAVENOUS
  Filled 2020-07-01: qty 100

## 2020-07-01 MED ORDER — METHOCARBAMOL 500 MG IVPB - SIMPLE MED
500.0000 mg | Freq: Four times a day (QID) | INTRAVENOUS | Status: DC | PRN
  Filled 2020-07-01: qty 50

## 2020-07-01 MED ORDER — OXYCODONE HCL 5 MG PO TABS
5.0000 mg | ORAL_TABLET | ORAL | Status: DC | PRN
  Administered 2020-07-01: 10 mg via ORAL
  Filled 2020-07-01: qty 2

## 2020-07-01 MED ORDER — ACETAMINOPHEN 10 MG/ML IV SOLN: 1000.0000 mg | Freq: Once | INTRAVENOUS | Status: DC | PRN

## 2020-07-01 MED ORDER — ONDANSETRON HCL 4 MG/2ML IJ SOLN
INTRAMUSCULAR | Status: DC | PRN
  Administered 2020-07-01: 4 mg via INTRAVENOUS

## 2020-07-01 MED ORDER — PROPOFOL 500 MG/50ML IV EMUL
INTRAVENOUS | Status: AC
  Filled 2020-07-01: qty 50

## 2020-07-01 MED ORDER — PHENYLEPHRINE HCL (PRESSORS) 10 MG/ML IV SOLN
INTRAVENOUS | Status: AC
  Filled 2020-07-01: qty 1

## 2020-07-01 MED ORDER — LACTATED RINGERS IV SOLN: INTRAVENOUS | Status: DC

## 2020-07-01 MED ORDER — POLYETHYLENE GLYCOL 3350 17 G PO PACK: 17.0000 g | PACK | Freq: Every day | ORAL | Status: DC | PRN

## 2020-07-01 MED ORDER — SODIUM CHLORIDE (PF) 0.9 % IJ SOLN
INTRAMUSCULAR | Status: AC
  Filled 2020-07-01: qty 50

## 2020-07-01 MED ORDER — FENTANYL CITRATE (PF) 100 MCG/2ML IJ SOLN
INTRAMUSCULAR | Status: DC | PRN
  Administered 2020-07-01: 25 ug via INTRAVENOUS

## 2020-07-01 MED ORDER — DEXAMETHASONE SODIUM PHOSPHATE 10 MG/ML IJ SOLN
INTRAMUSCULAR | Status: DC | PRN
  Administered 2020-07-01: 5 mg
  Administered 2020-07-01: 4 mg

## 2020-07-01 MED ORDER — ONDANSETRON HCL 4 MG/2ML IJ SOLN: 4.0000 mg | Freq: Four times a day (QID) | INTRAMUSCULAR | Status: DC | PRN

## 2020-07-01 MED ORDER — FENTANYL CITRATE (PF) 100 MCG/2ML IJ SOLN
INTRAMUSCULAR | Status: AC
  Filled 2020-07-01: qty 2

## 2020-07-01 MED ORDER — SODIUM CHLORIDE 0.9 % IV SOLN: INTRAVENOUS | Status: DC

## 2020-07-01 MED ORDER — ASPIRIN EC 325 MG PO TBEC
325.0000 mg | DELAYED_RELEASE_TABLET | Freq: Two times a day (BID) | ORAL | Status: DC
  Administered 2020-07-01 – 2020-07-02 (×2): 325 mg via ORAL
  Filled 2020-07-01 (×2): qty 1

## 2020-07-01 MED ORDER — HYDROMORPHONE HCL 1 MG/ML IJ SOLN: 0.2500 mg | INTRAMUSCULAR | Status: DC | PRN

## 2020-07-01 MED ORDER — ROPIVACAINE HCL 7.5 MG/ML IJ SOLN
INTRAMUSCULAR | Status: DC | PRN
  Administered 2020-07-01: 20 mL via PERINEURAL

## 2020-07-01 MED ORDER — MENTHOL 3 MG MT LOZG: 1.0000 | LOZENGE | OROMUCOSAL | Status: DC | PRN

## 2020-07-01 MED ORDER — PROPOFOL 10 MG/ML IV BOLUS
INTRAVENOUS | Status: DC | PRN
  Administered 2020-07-01: 10 mg via INTRAVENOUS

## 2020-07-01 MED ORDER — BUPIVACAINE IN DEXTROSE 0.75-8.25 % IT SOLN
INTRATHECAL | Status: DC | PRN
  Administered 2020-07-01: 1.6 mL via INTRATHECAL

## 2020-07-01 MED ORDER — PROPOFOL 1000 MG/100ML IV EMUL
INTRAVENOUS | Status: AC
  Filled 2020-07-01: qty 100

## 2020-07-01 MED ORDER — PROPOFOL 10 MG/ML IV BOLUS
INTRAVENOUS | Status: AC
  Filled 2020-07-01: qty 20

## 2020-07-01 MED ORDER — FENTANYL CITRATE (PF) 100 MCG/2ML IJ SOLN
50.0000 ug | Freq: Once | INTRAMUSCULAR | Status: AC
  Administered 2020-07-01: 50 ug via INTRAVENOUS
  Filled 2020-07-01: qty 2

## 2020-07-01 MED ORDER — SODIUM CHLORIDE (PF) 0.9 % IJ SOLN
INTRAMUSCULAR | Status: DC | PRN
  Administered 2020-07-01: 50 mL

## 2020-07-01 MED ORDER — SODIUM CHLORIDE 0.9 % IR SOLN
Status: DC | PRN
  Administered 2020-07-01: 3000 mL

## 2020-07-01 MED ORDER — METHOCARBAMOL 500 MG PO TABS
500.0000 mg | ORAL_TABLET | Freq: Four times a day (QID) | ORAL | Status: DC | PRN
  Administered 2020-07-01: 500 mg via ORAL
  Filled 2020-07-01: qty 1

## 2020-07-01 MED ORDER — ACETAMINOPHEN 325 MG PO TABS: 325.0000 mg | ORAL_TABLET | Freq: Four times a day (QID) | ORAL | Status: DC | PRN

## 2020-07-01 MED ORDER — ONDANSETRON HCL 4 MG PO TABS: 4.0000 mg | ORAL_TABLET | Freq: Four times a day (QID) | ORAL | Status: DC | PRN

## 2020-07-01 MED ORDER — BUPIVACAINE HCL (PF) 0.25 % IJ SOLN
INTRAMUSCULAR | Status: AC
  Filled 2020-07-01: qty 30

## 2020-07-01 MED ORDER — SODIUM CHLORIDE 0.9 % IR SOLN
Status: DC | PRN
  Administered 2020-07-01: 1000 mL

## 2020-07-01 MED ORDER — HYDROMORPHONE HCL 1 MG/ML IJ SOLN: 0.5000 mg | INTRAMUSCULAR | Status: DC | PRN

## 2020-07-01 MED ORDER — DOCUSATE SODIUM 100 MG PO CAPS
100.0000 mg | ORAL_CAPSULE | Freq: Two times a day (BID) | ORAL | Status: DC
  Administered 2020-07-01 – 2020-07-02 (×2): 100 mg via ORAL
  Filled 2020-07-01 (×2): qty 1

## 2020-07-01 MED ORDER — POVIDONE-IODINE 10 % EX SWAB
2.0000 "application " | Freq: Once | CUTANEOUS | Status: AC
  Administered 2020-07-01: 2 via TOPICAL

## 2020-07-01 MED ORDER — ONDANSETRON HCL 4 MG/2ML IJ SOLN
INTRAMUSCULAR | Status: AC
  Filled 2020-07-01: qty 2

## 2020-07-01 MED ORDER — BUPIVACAINE LIPOSOME 1.3 % IJ SUSP
INTRAMUSCULAR | Status: DC | PRN
  Administered 2020-07-01: 20 mL

## 2020-07-01 MED ORDER — PHENOL 1.4 % MT LIQD: 1.0000 | OROMUCOSAL | Status: DC | PRN

## 2020-07-01 MED ORDER — TRANEXAMIC ACID-NACL 1000-0.7 MG/100ML-% IV SOLN
1000.0000 mg | INTRAVENOUS | Status: AC
  Administered 2020-07-01: 1000 mg via INTRAVENOUS
  Filled 2020-07-01: qty 100

## 2020-07-01 MED ORDER — BUPIVACAINE HCL (PF) 0.25 % IJ SOLN
INTRAMUSCULAR | Status: DC | PRN
  Administered 2020-07-01: 30 mL

## 2020-07-01 MED ORDER — EPHEDRINE 5 MG/ML INJ
INTRAVENOUS | Status: AC
  Filled 2020-07-01: qty 10

## 2020-07-01 SURGICAL SUPPLY — 66 items
AUG TIB SZ3 5 REV STP WDG STRL (Knees) ×2 IMPLANT
BAG SPEC THK2 15X12 ZIP CLS (MISCELLANEOUS) ×1
BAG ZIPLOCK 12X15 (MISCELLANEOUS) ×3 IMPLANT
BLADE SAGITTAL 25.0X1.19X90 (BLADE) ×2 IMPLANT
BLADE SAGITTAL 25.0X1.19X90MM (BLADE) ×1
BLADE SAW SGTL 13.0X1.19X90.0M (BLADE) ×3 IMPLANT
BLADE SAW SGTL 81X20 HD (BLADE) IMPLANT
BLADE SURG SZ10 CARB STEEL (BLADE) ×6 IMPLANT
BNDG ELASTIC 4X5.8 VLCR STR LF (GAUZE/BANDAGES/DRESSINGS) ×3 IMPLANT
BNDG ELASTIC 6X5.8 VLCR STR LF (GAUZE/BANDAGES/DRESSINGS) ×3 IMPLANT
BONE CEMENT GENTAMICIN (Cement) ×6 IMPLANT
BOOTIES KNEE HIGH SLOAN (MISCELLANEOUS) ×3 IMPLANT
BOWL SMART MIX CTS (DISPOSABLE) ×3 IMPLANT
CEMENT BONE GENTAMICIN 40 (Cement) IMPLANT
COVER WAND RF STERILE (DRAPES) IMPLANT
DECANTER SPIKE VIAL GLASS SM (MISCELLANEOUS) ×3 IMPLANT
DRAPE U-SHAPE 47X51 STRL (DRAPES) ×3 IMPLANT
DRSG AQUACEL AG ADV 3.5X10 (GAUZE/BANDAGES/DRESSINGS) ×3 IMPLANT
DRSG PAD ABDOMINAL 8X10 ST (GAUZE/BANDAGES/DRESSINGS) ×3 IMPLANT
ELECT REM PT RETURN 15FT ADLT (MISCELLANEOUS) ×3 IMPLANT
GAUZE SPONGE 4X4 12PLY STRL (GAUZE/BANDAGES/DRESSINGS) ×3 IMPLANT
GAUZE XEROFORM 5X9 LF (GAUZE/BANDAGES/DRESSINGS) ×3 IMPLANT
GLOVE BIOGEL PI IND STRL 8 (GLOVE) ×2 IMPLANT
GLOVE BIOGEL PI INDICATOR 8 (GLOVE) ×4
GLOVE ECLIPSE 7.5 STRL STRAW (GLOVE) ×6 IMPLANT
GOWN STRL REUS W/TWL XL LVL3 (GOWN DISPOSABLE) ×6 IMPLANT
HANDPIECE INTERPULSE COAX TIP (DISPOSABLE) ×3
HOLDER FOLEY CATH W/STRAP (MISCELLANEOUS) ×3 IMPLANT
HOOD PEEL AWAY FLYTE STAYCOOL (MISCELLANEOUS) ×9 IMPLANT
IMMOBILIZER KNEE 20 (SOFTGOODS)
IMMOBILIZER KNEE 20 THIGH 36 (SOFTGOODS) IMPLANT
INSERT LCS 3 PEG STD (Knees) ×2 IMPLANT
INSERT LCS COM RP STDPLUS 17.5 (Knees) ×3 IMPLANT
KIT TURNOVER KIT A (KITS) IMPLANT
MANIFOLD NEPTUNE II (INSTRUMENTS) ×3 IMPLANT
NDL SAFETY ECLIPSE 18X1.5 (NEEDLE) IMPLANT
NEEDLE HYPO 18GX1.5 SHARP (NEEDLE)
NS IRRIG 1000ML POUR BTL (IV SOLUTION) ×3 IMPLANT
PACK TOTAL KNEE CUSTOM (KITS) ×3 IMPLANT
PAD CAST 4YDX4 CTTN HI CHSV (CAST SUPPLIES) IMPLANT
PADDING CAST COTTON 4X4 STRL (CAST SUPPLIES) ×3
PADDING CAST COTTON 6X4 STRL (CAST SUPPLIES) ×3 IMPLANT
PENCIL SMOKE EVACUATOR (MISCELLANEOUS) IMPLANT
PIN STEINMAN FIXATION KNEE (PIN) ×2 IMPLANT
PROTECTOR NERVE ULNAR (MISCELLANEOUS) ×3 IMPLANT
SET HNDPC FAN SPRY TIP SCT (DISPOSABLE) ×1 IMPLANT
STAPLER VISISTAT 35W (STAPLE) ×2 IMPLANT
STEM UNIVERSAL REVISION 75X12 (Stem) ×3 IMPLANT
SUT ETHIBOND NAB CT1 #1 30IN (SUTURE) ×2 IMPLANT
SUT VIC AB 0 CT1 27 (SUTURE) ×6
SUT VIC AB 0 CT1 27XBRD ANTBC (SUTURE) ×2 IMPLANT
SUT VIC AB 1 CT1 27 (SUTURE) ×9
SUT VIC AB 1 CT1 27XBRD ANBCTR (SUTURE) ×1 IMPLANT
SUT VIC AB 1 CT1 27XBRD ANTBC (SUTURE) ×2 IMPLANT
SUT VIC AB 2-0 CT1 27 (SUTURE) ×3
SUT VIC AB 2-0 CT1 TAPERPNT 27 (SUTURE) ×1 IMPLANT
SWAB COLLECTION DEVICE MRSA (MISCELLANEOUS) ×2 IMPLANT
SWAB CULTURE ESWAB REG 1ML (MISCELLANEOUS) ×3 IMPLANT
SYR 3ML LL SCALE MARK (SYRINGE) IMPLANT
TRAY FOLEY MTR SLVR 16FR STAT (SET/KITS/TRAYS/PACK) ×3 IMPLANT
TRAY REVISION SZ 3 (Knees) ×2 IMPLANT
TRAY SLEEVE CEM ML (Knees) ×2 IMPLANT
WATER STERILE IRR 1000ML POUR (IV SOLUTION) ×3 IMPLANT
WEDGE SIZE 3 5MM (Knees) ×6 IMPLANT
WRAP KNEE MAXI GEL POST OP (GAUZE/BANDAGES/DRESSINGS) ×2 IMPLANT
YANKAUER SUCT BULB TIP NO VENT (SUCTIONS) ×3 IMPLANT

## 2020-07-01 NOTE — Progress Notes (Signed)
Assisted Dr. Greg Stoltzfus with left, ultrasound guided, adductor canal block. Side rails up, monitors on throughout procedure. See vital signs in flow sheet. Tolerated Procedure well.  

## 2020-07-01 NOTE — Evaluation (Signed)
Physical Therapy Evaluation Patient Details Name: Samantha Stone MRN: [id number] DOB: [birth year] Today's Date: 07/01/2020   History of Present Illness  Patient is 86 y.o. female s/p Lt TKR on 07/01/20 secondary to component loosening. PMH significant for HTN, OA, hypothyroidism, bil TKA with Rt revision in 2016.    Clinical Impression  ELLYSSA ZAGAL is a 86 y.o. female POD 0 s/p Lt TKR. Patient reports independence with rollator for mobility at baseline. Patient is now limited by functional impairments (see PT problem list below) and requires min-mod assist for transfers and gait with RW. Patient was able to ambulate ~20 feet with RW and min assist. Patient instructed in exercise to facilitate ROM and circulation. Patient will benefit from continued skilled PT interventions to address impairments and progress towards PLOF. Acute PT will follow to progress mobility and stair training in preparation for safe discharge home.     Follow Up Recommendations Follow surgeon's recommendation for DC plan and follow-up therapies;SNF    Equipment Recommendations  Rolling walker with 5" wheels    Recommendations for Other Services       Precautions / Restrictions Precautions Precautions: Fall Restrictions Weight Bearing Restrictions: No LLE Weight Bearing: Weight bearing as tolerated      Mobility  Bed Mobility Overal bed mobility: Needs Assistance Bed Mobility: Supine to Sit     Supine to sit: Min assist;HOB elevated     General bed mobility comments: cues to walk LE's to EOB and assist for Lt LE. assist to raise trunk and scoot forward.    Transfers Overall transfer level: Needs assistance Equipment used: Rolling walker (2 wheeled) Transfers: Sit to/from Stand Sit to Stand: From elevated surface;Mod assist         General transfer comment: cues for technique with RW, mod assist to initiate power up and steady with rising.  Ambulation/Gait Ambulation/Gait assistance: Min  assist Gait Distance (Feet): 20 Feet Assistive device: Rolling walker (2 wheeled) Gait Pattern/deviations: Step-to pattern;Decreased stride length;Decreased weight shift to left;Antalgic Gait velocity: decr   General Gait Details: VC's for step pattern and RW position, assist to manage RW placement.  Stairs            Wheelchair Mobility    Modified Rankin (Stroke Patients Only)       Balance Overall balance assessment: Needs assistance Sitting-balance support: Feet supported Sitting balance-Leahy Scale: Fair     Standing balance support: During functional activity;Bilateral upper extremity supported Standing balance-Leahy Scale: Fair                               Pertinent Vitals/Pain Pain Assessment: 0-10 Pain Score: 5  Pain Location: Lt knee Pain Descriptors / Indicators: Aching;Discomfort;Sore Pain Intervention(s): Limited activity within patient's tolerance;Monitored during session;Repositioned;Patient requesting pain meds-RN notified;Ice applied    Home Living Family/patient expects to be discharged to:: Private residence Environmental education officer) Living Arrangements: Alone Available Help at Discharge: [place] Type of Home: House Home Access: Level entry     [place]: One [place]: Environmental consultant - 2 wheels;Cane - single point;Bedside commode;Shower seat;Walker - 4 wheels      Prior Function Level of Independence: Independent with assistive device(s)         Comments: using Rollator     Hand Dominance   Dominant Hand: Left    Extremity/Trunk Assessment   Upper Extremity Assessment Upper Extremity Assessment: Overall WFL for tasks assessed    Lower Extremity  Assessment Lower Extremity Assessment: Generalized weakness;LLE deficits/detail LLE Deficits / Details: no extensor lag with SLR LLE Sensation: WNL LLE Coordination: WNL    Cervical / Trunk Assessment Cervical / Trunk Assessment: Normal  Communication    Communication: No difficulties  Cognition Arousal/Alertness: Awake/alert Behavior During Therapy: WFL for tasks assessed/performed Overall Cognitive Status: Within Functional Limits for tasks assessed                                        General Comments      Exercises Total Joint Exercises Ankle Circles/Pumps: AROM;Both;20 reps;Seated Quad Sets: AROM;Left;5 reps;Seated   Assessment/Plan    PT Assessment Patient needs continued PT services  PT Problem List Decreased range of motion;Decreased strength;Decreased activity tolerance;Decreased balance;Decreased mobility;Decreased knowledge of use of DME;Decreased knowledge of precautions;Pain       PT Treatment Interventions DME instruction;Gait training;Stair training;Functional mobility training;Therapeutic activities;Therapeutic exercise;Balance training;Patient/family education    PT Goals (Current goals can be found in the Care Plan section)  Acute Rehab PT Goals Patient Stated Goal: recover at [facility] and get her perm done PT Goal Formulation: With patient Time For Goal Achievement: 07/08/20 Potential to Achieve Goals: Good    Frequency 7X/week   Barriers to discharge        Co-evaluation               AM-PAC PT "6 Clicks" Mobility  Outcome Measure Help needed turning from your back to your side while in a flat bed without using bedrails?: None Help needed moving from lying on your back to sitting on the side of a flat bed without using bedrails?: A Little Help needed moving to and from a bed to a chair (including a wheelchair)?: A Little Help needed standing up from a chair using your arms (e.g., wheelchair or bedside chair)?: A Lot Help needed to walk in hospital room?: A Little Help needed climbing 3-5 steps with a railing? : A Lot 6 Click Score: 17    End of Session Equipment Utilized During Treatment: Gait belt Activity Tolerance: Patient tolerated treatment well Patient left: in  chair;with call bell/phone within reach;with chair alarm set Nurse Communication: Mobility status PT Visit Diagnosis: Muscle weakness (generalized) (M62.81);Difficulty in walking, not elsewhere classified (R26.2);Pain Pain - Right/Left: Left Pain - part of body: Knee    Time: 4536-4680 PT Time Calculation (min) (ACUTE ONLY): 24 min   Charges:   PT Evaluation $PT Eval Low Complexity: 1 Low PT Treatments $Gait Training: 8-22 mins        Verner Mould, DPT Acute Rehabilitation Services  Office [phone] Pager [phone]  07/01/2020 6:30 PM

## 2020-07-01 NOTE — Anesthesia Procedure Notes (Addendum)
Anesthesia Regional Block: Adductor canal block   Pre-Anesthetic Checklist: ,, timeout performed, Correct Patient, Correct Site, Correct Laterality, Correct Procedure, Correct Position, site marked, Risks and benefits discussed,  Surgical consent,  Pre-op evaluation,  At surgeon's request and post-op pain management  Laterality: Left  Prep: Dura Prep       Needles:  Injection technique: Single-shot  Needle Type: Echogenic Stimulator Needle     Needle Length: 9cm  Needle Gauge: 20     Additional Needles:   Procedures:,,,, ultrasound used (permanent image in chart),,,,  Narrative:  Start time: 07/01/2020 10:00 AM End time: 07/01/2020 10:05 AM Injection made incrementally with aspirations every 5 mL.  Performed by: Personally  Anesthesiologist: Darral Dash, DO  Additional Notes: Patient identified. Risks/Benefits/Options discussed with patient including but not limited to bleeding, infection, nerve damage, failed block, incomplete pain control. Patient expressed understanding and wished to proceed. All questions were answered. Sterile technique was used throughout the entire procedure. Please see nursing notes for vital signs. Aspirated in 5cc intervals with injection for negative confirmation. Patient was given instructions on fall risk and not to get out of bed. All questions and concerns addressed with instructions to call with any issues or inadequate analgesia.

## 2020-07-01 NOTE — Brief Op Note (Signed)
07/01/2020  1:34 PM  PATIENT:  Samantha Stone  86 y.o. female  PRE-OPERATIVE DIAGNOSIS:  LEFT KNEE PAIN AFTER TOTAL KNEE ARTHROPLASTY, HARDWARE LOOSENING VERSUS INFECTION  POST-OPERATIVE DIAGNOSIS:  LEFT KNEE PAIN AFTER TOTAL KNEE ARTHROPLASTY, HARDWARE LOOSENING VERSUS INFECTION  PROCEDURE:  Procedure(s): TOTAL KNEE REVISION (Left)  SURGEON:  Surgeon(s) and Role:    Dorna Leitz, MD - Primary  PHYSICIAN ASSISTANT:   ASSISTANTS: mike craig pac  ANESTHESIA:   spinal  EBL:  50 mL   BLOOD ADMINISTERED:none  DRAINS: none   LOCAL MEDICATIONS USED:  MARCAINE    and OTHER experel  SPECIMEN:  Source of Specimen:  tissue of knee  DISPOSITION OF SPECIMEN:  N/A  COUNTS:  YES  TOURNIQUET:   Total Tourniquet Time Documented: Thigh (Left) - 74 minutes Total: Thigh (Left) - 74 minutes   DICTATION: .Other Dictation: Dictation Number [id number]  PLAN OF CARE: Admit to inpatient   PATIENT DISPOSITION:  PACU - hemodynamically stable.   Delay start of Pharmacological VTE agent (>24hrs) due to surgical blood loss or risk of bleeding: no

## 2020-07-01 NOTE — Anesthesia Postprocedure Evaluation (Signed)
Anesthesia Post Note  Patient: Samantha Stone  Procedure(s) Performed: TOTAL KNEE REVISION (Left Knee)     Patient location during evaluation: PACU Anesthesia Type: Regional, MAC and Spinal Level of consciousness: awake and alert Pain management: pain level controlled Vital Signs Assessment: post-procedure vital signs reviewed and stable Respiratory status: spontaneous breathing, nonlabored ventilation, respiratory function stable and patient connected to nasal cannula oxygen Cardiovascular status: blood pressure returned to baseline and stable Postop Assessment: no apparent nausea or vomiting Anesthetic complications: no   No complications documented.  Last Vitals:  Vitals:   07/01/20 1515 07/01/20 1537  BP: (!) 164/74 (!) 164/81  Pulse: 67 67  Resp: 18 17  Temp: 36.4 C 36.6 C  SpO2: 98% 99%    Last Pain:  Vitals:   07/01/20 1537  TempSrc: Oral  PainSc: 0-No pain                 Belenda Cruise P Takao Lizer

## 2020-07-01 NOTE — Transfer of Care (Signed)
Immediate Anesthesia Transfer of Care Note  Patient: TATIONNA FULLARD  Procedure(s) Performed: TOTAL KNEE REVISION (Left Knee)  Patient Location: PACU  Anesthesia Type:Spinal  Level of Consciousness: awake, drowsy and patient cooperative  Airway & Oxygen Therapy: Patient Spontanous Breathing and Patient connected to face mask oxygen  Post-op Assessment: Report given to RN and Post -op Vital signs reviewed and stable  Post vital signs: Reviewed and stable  Last Vitals:  Vitals Value Taken Time  BP 146/79 07/01/20 1411  Temp    Pulse 66 07/01/20 1413  Resp    SpO2 100 % 07/01/20 1413  Vitals shown include unvalidated device data.  Last Pain:  Vitals:   07/01/20 1005  TempSrc:   PainSc: 0-No pain      Patients Stated Pain Goal: 4 (08/67/61 9509)  Complications: No complications documented.

## 2020-07-01 NOTE — Anesthesia Procedure Notes (Signed)
Spinal  Patient location during procedure: OR Start time: 07/01/2020 11:33 AM End time: 07/01/2020 11:37 AM Staffing Performed: anesthesiologist  Anesthesiologist: Darral Dash, DO Preanesthetic Checklist Completed: patient identified, IV checked, site marked, risks and benefits discussed, surgical consent, monitors and equipment checked, pre-op evaluation and timeout performed Spinal Block Patient position: sitting Prep: DuraPrep Patient monitoring: heart rate, cardiac monitor, continuous pulse ox and blood pressure Approach: midline Location: L4-5 Injection technique: single-shot Needle Needle type: Pencan  Needle gauge: 24 G Needle length: 10 cm Additional Notes Patient identified. Risks/Benefits/Options discussed with patient including but not limited to bleeding, infection, nerve damage, paralysis, failed block, incomplete pain control, headache, blood pressure changes, nausea, vomiting, reactions to medications, itching and postpartum back pain. Confirmed with bedside nurse the patient's most recent platelet count. Confirmed with patient that they are not currently taking any anticoagulation, have any bleeding history or any family history of bleeding disorders. Patient expressed understanding and wished to proceed. All questions were answered. Sterile technique was used throughout the entire procedure. Please see nursing notes for vital signs. Warning signs of high block given to the patient including shortness of breath, tingling/numbness in hands, complete motor block, or any concerning symptoms with instructions to call for help. Patient was given instructions on fall risk and not to get out of bed. All questions and concerns addressed with instructions to call with any issues or inadequate analgesia.

## 2020-07-01 NOTE — Anesthesia Procedure Notes (Signed)
Procedure Name: MAC Date/Time: 07/01/2020 11:31 AM Performed by: Eben Burow, CRNA Pre-anesthesia Checklist: Patient identified, Emergency Drugs available, Suction available, Patient being monitored and Timeout performed Oxygen Delivery Method: Simple face mask Placement Confirmation: positive ETCO2

## 2020-07-01 NOTE — NC FL2 (Signed)
[place] LEVEL OF CARE SCREENING TOOL     IDENTIFICATION  Patient Name: Samantha Stone Birthdate: 12-06-1930 Sex: female Admission Date (Current Location): 07/01/2020  [facility] and [place] Number:  Herbalist and Address:  [facility],  [place] [address], [place]      Provider Number: [id number]  Attending Physician Name and Address:  Dorna Leitz, MD  Relative Name and Phone Number:  [name] Daughter [phone]  [phone]    Current Level of Care: Hospital Recommended Level of Care: [place] Prior Approval Number:    Date Approved/Denied:   PASRR Number:    Discharge Plan: SNF    Current Diagnoses: Patient Active Problem List   Diagnosis Date Noted  . 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 Status post revision of total replacement of left knee 07/01/2020  . Continuous leakage of urine 10/15/2018  . Early dry stage nonexudative age-related macular degeneration of both eyes 10/15/2018  . Presbycusis of both ears 10/15/2018  . S/P reverse total shoulder arthroplasty, right 10/15/2018  . Degenerative arthritis of right shoulder region 09/04/2018  . Morbid (severe) obesity due to excess calories ([place]) 08/28/2016  . Dyspnea on exertion 08/27/2016  . Cough variant asthma 08/27/2016  . Chronic diastolic congestive heart failure ([place]) 05/07/2016  . Postoperative anemia due to acute blood loss 03/17/2015  . Aseptic loosening of prosthetic knee ([facility]) 03/14/2015  . 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 Confusion 06/16/2014  . Acute encephalopathy 06/16/2014  . HLD (hyperlipidemia) 06/16/2014  . Hypothyroidism 06/16/2014  . Frontal headache   . Aphasia 10/23/2013  . Essential hypertension 10/23/2013    Orientation RESPIRATION BLADDER Height & Weight     Self, Time, Situation, Place  Normal Continent  Weight: 264 lb (119.7 kg) Height:  5\' 4"  (162.6 cm)  BEHAVIORAL SYMPTOMS/MOOD NEUROLOGICAL BOWEL NUTRITION STATUS      Continent Diet (Regular Diet)  AMBULATORY STATUS COMMUNICATION OF NEEDS Skin   Extensive Assist Verbally Surgical wounds (Surgical Incision, Left Knee)                       Personal Care Assistance Level of Assistance  Bathing, Dressing, Feeding Bathing Assistance: Limited assistance Feeding assistance: Independent Dressing Assistance: Limited assistance     Functional Limitations Info  Sight, Speech, Hearing Sight Info: Impaired Hearing Info: Adequate Speech Info: Adequate    SPECIAL CARE FACTORS FREQUENCY  PT (By licensed PT), OT (By licensed OT)     PT Frequency: 5x/week OT Frequency: 5x/week            Contractures Contractures Info: Not present    Additional Factors Info  Code Status, Allergies Code Status Info: DNR Allergies Info: Allergies: Phenergan (Promethazine Hcl), Lisinopril           Current Medications (07/01/2020):  This is the current hospital active medication list Current Facility-Administered Medications  Medication Dose Route Frequency Provider Last Rate Last Admin  . 0.9 %  sodium chloride infusion   Intravenous Continuous Dereck Leep, PA-C 100 mL/hr at 07/01/20 1516 New Bag at 07/01/20 1516  . [START ON 07/02/2020] acetaminophen (TYLENOL) tablet 325-650 mg  325-650 mg Oral Q6H PRN Dereck Leep, PA-C      . alum & mag hydroxide-simeth (MAALOX/MYLANTA) 200-200-20 MG/5ML suspension 30 mL  30 mL Oral Q4H PRN Dereck Leep, PA-C      .  aspirin EC tablet 325 mg  325 mg Oral BID PC Dereck Leep, PA-C      . bisacodyl (DULCOLAX) EC tablet 5 mg  5 mg Oral Daily PRN Dereck Leep, PA-C      . ceFAZolin (ANCEF) IVPB 2g/100 mL premix  2 g Intravenous Q6H Dereck Leep, PA-C      . [START ON 07/02/2020] dexamethasone (DECADRON) injection 10 mg  10 mg Intravenous Q12H Dereck Leep, PA-C      . diphenhydrAMINE  (BENADRYL) 12.5 MG/5ML elixir 12.5-25 mg  12.5-25 mg Oral Q4H PRN Dereck Leep, PA-C      . docusate sodium (COLACE) capsule 100 mg  100 mg Oral BID Dereck Leep, PA-C      . HYDROmorphone (DILAUDID) injection 0.5-1 mg  0.5-1 mg Intravenous Q4H PRN Dereck Leep, PA-C      . magnesium citrate solution 1 Bottle  1 Bottle Oral Once PRN Dereck Leep, PA-C      . menthol-cetylpyridinium (CEPACOL) lozenge 3 mg  1 lozenge Oral PRN Dereck Leep, PA-C       Or  . phenol (CHLORASEPTIC) mouth spray 1 spray  1 spray Mouth/Throat PRN Dereck Leep, PA-C      . methocarbamol (ROBAXIN) tablet 500 mg  500 mg Oral Q6H PRN Dereck Leep, PA-C       Or  . methocarbamol (ROBAXIN) 500 mg in dextrose 5 % 50 mL IVPB  500 mg Intravenous Q6H PRN Dereck Leep, PA-C      . metoCLOPramide (REGLAN) tablet 5-10 mg  5-10 mg Oral Q8H PRN Dereck Leep, PA-C       Or  . metoCLOPramide (REGLAN) injection 5-10 mg  5-10 mg Intravenous Q8H PRN Dereck Leep, PA-C      . ondansetron [facility]) tablet 4 mg  4 mg Oral Q6H PRN Dereck Leep, PA-C       Or  . ondansetron [facility]) injection 4 mg  4 mg Intravenous Q6H PRN Dereck Leep, PA-C      . oxyCODONE (Oxy IR/ROXICODONE) immediate release tablet 10-15 mg  10-15 mg Oral Q4H PRN Dereck Leep, PA-C      . oxyCODONE (Oxy IR/ROXICODONE) immediate release tablet 5-10 mg  5-10 mg Oral Q4H PRN Dereck Leep, PA-C   10 mg at 07/01/20 1610  . polyethylene glycol (MIRALAX / GLYCOLAX) packet 17 g  17 g Oral Daily PRN Dereck Leep, PA-C         Discharge Medications: Please see discharge summary for a list of discharge medications.  Relevant Imaging Results:  Relevant Lab Results:   Additional Information SSN: [id number]  Lia Hopping, LCSW

## 2020-07-01 NOTE — Progress Notes (Signed)
Orthopedic Tech Progress Note Patient Details:  BRIE EPPARD 09/04/1930 [id number]  CPM Left Knee CPM Left Knee: On Left Knee Flexion (Degrees): 70 Left Knee Extension (Degrees): 70  Post Interventions Patient Tolerated: Well Instructions Provided: Care of device Ortho Devices Ortho Device/Splint Location: cpm to LLE Ortho Device/Splint Interventions: Ordered, Application   Post Interventions Patient Tolerated: Well Instructions Provided: Care of device   Braulio Bosch 07/01/2020, 3:02 PM

## 2020-07-01 NOTE — Interval H&P Note (Signed)
History and Physical Interval Note:  07/01/2020 9:09 AM  Samantha Stone  has presented today for surgery, with the diagnosis of LEFT KNEE PAIN AFTER TOTAL KNEE ARTHROPLASTY, HARDWARE LOOSENING VERSUS INFECTION.  The various methods of treatment have been discussed with the patient and family. After consideration of risks, benefits and other options for treatment, the patient has consented to  Procedure(s): TOTAL KNEE REVISION (Left) as a surgical intervention.  The patient's history has been reviewed, patient examined, no change in status, stable for surgery.  I have reviewed the patient's chart and labs.  Questions were answered to the patient's satisfaction.     Alta Corning

## 2020-07-01 NOTE — Anesthesia Preprocedure Evaluation (Signed)
Anesthesia Evaluation  Patient identified by MRN, date of birth, ID band Patient awake    Reviewed: Patient's Chart, lab work & pertinent test results, reviewed documented beta blocker date and time   Airway Mallampati: II  TM Distance: >3 FB Neck ROM: Full    Dental  (+) Teeth Intact   Pulmonary shortness of breath and with exertion,    Pulmonary exam normal        Cardiovascular hypertension, Pt. on medications and Pt. on home beta blockers  Rhythm:Regular Rate:Normal     Neuro/Psych  Headaches, negative psych ROS   GI/Hepatic negative GI ROS, Neg liver ROS,   Endo/Other  Hypothyroidism   Renal/GU negative Renal ROS  negative genitourinary   Musculoskeletal  (+) Arthritis , S/p TKR here for left revision   Abdominal (+)  Abdomen: soft. Bowel sounds: normal.  Peds  Hematology  (+) anemia ,   Anesthesia Other Findings   Reproductive/Obstetrics                             Anesthesia Physical Anesthesia Plan  ASA: II  Anesthesia Plan: MAC, Regional and Spinal   Post-op Pain Management:  Regional for Post-op pain   Induction: Intravenous  PONV Risk Score and Plan: Propofol infusion, Ondansetron, Dexamethasone and Treatment may vary due to age or medical condition  Airway Management Planned: Simple Face Mask, Natural Airway and Nasal Cannula  Additional Equipment: None  Intra-op Plan:   Post-operative Plan:   Informed Consent: I have reviewed the patients History and Physical, chart, labs and discussed the procedure including the risks, benefits and alternatives for the proposed anesthesia with the patient or authorized representative who has indicated his/her understanding and acceptance.     Dental advisory given  Plan Discussed with: CRNA  Anesthesia Plan Comments: (Lab Results      Component                Value               Date                      WBC                       6.2                 06/20/2020                HGB                      12.9                06/20/2020                HCT                      40.1                06/20/2020                MCV                      90.3                06/20/2020                PLT  273                 06/20/2020          )        Anesthesia Quick Evaluation

## 2020-07-01 NOTE — Progress Notes (Signed)
Orthopedic Tech Progress Note Patient Details:  Samantha Stone 04-11-1931 [id number]  Ortho Devices Ortho Device/Splint Location: cpm to LLE Ortho Device/Splint Interventions: Ordered, Application   Post Interventions Patient Tolerated: Well Instructions Provided: Care of device   [name] 07/01/2020, 4:28 PM

## 2020-07-01 NOTE — TOC Initial Note (Signed)
Transition of Care [facility]) - Initial/Assessment Note    Patient Details  Name: Samantha Stone MRN: [id number] Date of Birth: 1931-01-31  Transition of Care [facility]) CM/SW Contact:    Samantha Stone, [place] Phone Number: 07/01/2020, 4:32 PM  Clinical Narrative:  Patient admitted left knee total arthroplasty.     Re:SNF [facility].            CSW met with the patient and her daughter Samantha Stone at bedside. Patient will return to [facility] rehab. Patient and family prefer she transport by [facility].  CSW confirmed with [facility] staff Samantha Stone, the facility will accept the patient on Sat/Sun if medically stable.  FL2 completed.   TOC weekend staff will coordinate discharge.  Physician Please order a covid test   Expected Discharge Plan: Skilled Nursing Facility Barriers to Discharge: Continued Medical Work up   Patient Goals and CMS Choice Patient states their goals for this hospitalization and ongoing recovery are:: return to [facility] rehab      Expected Discharge Plan and Services Expected Discharge Plan: [place] In-house Referral: Clinical Social Work   Post Acute Care Choice: [place]                                        Prior Living Arrangements/Services   Lives with:: Facility Resident Patient language and need for interpreter reviewed:: No Do you feel safe going back to the place where you live?: Yes      Need for Family Participation in Patient Care: Yes (Comment) Care giver support system in place?: Yes (comment)   Criminal Activity/Legal Involvement Pertinent to Current Situation/Hospitalization: No - Comment as needed  Activities of Daily Living Home Assistive Devices/Equipment: Eyeglasses, Cane (specify quad or straight) ADL Screening (condition at time of admission) Patient's cognitive ability adequate to safely complete daily activities?: Yes Is the patient deaf or have difficulty hearing?: No Does the  patient have difficulty seeing, even when wearing glasses/contacts?: No Does the patient have difficulty concentrating, remembering, or making decisions?: No Patient able to express need for assistance with ADLs?: Yes Does the patient have difficulty dressing or bathing?: No Independently performs ADLs?: Yes (appropriate for developmental age) Does the patient have difficulty walking or climbing stairs?: Yes Weakness of Legs: None Weakness of Arms/Hands: None  Permission Sought/Granted Permission sought to share information with : Facility Art therapist granted to share information with : Yes, Verbal Permission Granted  Share Information with NAME: Samantha Stone  Permission granted to share info w AGENCY: [facility]  Permission granted to share info w Relationship: Daughter  Permission granted to share info w Contact Information: [phone]  [phone]  Emotional Assessment Appearance:: Appears stated age Attitude/Demeanor/Rapport: Engaged Affect (typically observed): Accepting, Pleasant Orientation: : Oriented to Self, Oriented to Place, Oriented to  Time, Oriented to Situation Alcohol / Substance Use: Not Applicable Psych Involvement: No (comment)  Admission diagnosis:  Status post revision of total replacement of left knee [Z96.652] Patient Active Problem List   Diagnosis Date Noted  . Failed total left knee replacement ([place]) 07/01/2020  . Status post revision of total replacement of left knee 07/01/2020  . Continuous leakage of urine 10/15/2018  . Early dry stage nonexudative age-related macular degeneration of both eyes 10/15/2018  . Presbycusis of both ears 10/15/2018  . S/P reverse total shoulder arthroplasty, right 10/15/2018  . Degenerative arthritis of right shoulder region 09/04/2018  .  Morbid (severe) obesity due to excess calories ([place]) 08/28/2016  . Dyspnea on exertion 08/27/2016  . Cough variant asthma 08/27/2016  . Chronic diastolic  congestive heart failure ([place]) 05/07/2016  . Postoperative anemia due to acute blood loss 03/17/2015  . Aseptic loosening of prosthetic knee (HCC) 03/14/2015  . 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 Confusion 06/16/2014  . Acute encephalopathy 06/16/2014  . HLD (hyperlipidemia) 06/16/2014  . Hypothyroidism 06/16/2014  . Frontal headache   . Aphasia 10/23/2013  . Essential hypertension 10/23/2013   PCP:  Samantha Curry, DO Pharmacy:   [id number] [id number]: [phone]: [phone]    Social Determinants of Health (SDOH) Interventions    Readmission Risk Interventions No flowsheet data found.

## 2020-07-02 ENCOUNTER — Encounter: Payer: Self-pay | Admitting: Nurse Practitioner

## 2020-07-02 DIAGNOSIS — I110 Hypertensive heart disease with heart failure: Secondary | ICD-10-CM | POA: Diagnosis not present

## 2020-07-02 DIAGNOSIS — M2550 Pain in unspecified joint: Secondary | ICD-10-CM | POA: Diagnosis not present

## 2020-07-02 DIAGNOSIS — Z743 Need for continuous supervision: Secondary | ICD-10-CM | POA: Diagnosis not present

## 2020-07-02 DIAGNOSIS — I5032 Chronic diastolic (congestive) heart failure: Secondary | ICD-10-CM | POA: Diagnosis not present

## 2020-07-02 DIAGNOSIS — Z7982 Long term (current) use of aspirin: Secondary | ICD-10-CM | POA: Diagnosis not present

## 2020-07-02 DIAGNOSIS — Z79899 Other long term (current) drug therapy: Secondary | ICD-10-CM | POA: Diagnosis not present

## 2020-07-02 DIAGNOSIS — Z7401 Bed confinement status: Secondary | ICD-10-CM | POA: Diagnosis not present

## 2020-07-02 DIAGNOSIS — Z96653 Presence of artificial knee joint, bilateral: Secondary | ICD-10-CM | POA: Diagnosis not present

## 2020-07-02 DIAGNOSIS — Z85828 Personal history of other malignant neoplasm of skin: Secondary | ICD-10-CM | POA: Diagnosis not present

## 2020-07-02 DIAGNOSIS — R5381 Other malaise: Secondary | ICD-10-CM | POA: Diagnosis not present

## 2020-07-02 DIAGNOSIS — M1712 Unilateral primary osteoarthritis, left knee: Secondary | ICD-10-CM | POA: Diagnosis not present

## 2020-07-02 DIAGNOSIS — T84093A Other mechanical complication of internal left knee prosthesis, initial encounter: Secondary | ICD-10-CM | POA: Diagnosis not present

## 2020-07-02 DIAGNOSIS — R6889 Other general symptoms and signs: Secondary | ICD-10-CM | POA: Diagnosis not present

## 2020-07-02 DIAGNOSIS — E039 Hypothyroidism, unspecified: Secondary | ICD-10-CM | POA: Diagnosis not present

## 2020-07-02 LAB — CBC
HCT: 37.6 % (ref 36.0–46.0)
Hemoglobin: 12 g/dL (ref 12.0–15.0)
MCH: 29.4 pg (ref 26.0–34.0)
MCHC: 31.9 g/dL (ref 30.0–36.0)
MCV: 92.2 fL (ref 80.0–100.0)
Platelets: 203 10*3/uL (ref 150–400)
RBC: 4.08 MIL/uL (ref 3.87–5.11)
RDW: 13.9 % (ref 11.5–15.5)
WBC: 13.4 10*3/uL — ABNORMAL HIGH (ref 4.0–10.5)
nRBC: 0 % (ref 0.0–0.2)

## 2020-07-02 NOTE — Op Note (Unsigned)
NAME: Samantha Stone, Samantha Stone MEDICAL RECORD [id number] ACCOUNT [id number] DATE OF BIRTH:[birth year] FACILITY: [facility] LOCATION: [facility] PHYSICIAN:JOHN Maudie Mercury, MD  OPERATIVE REPORT  DATE OF PROCEDURE:  07/01/2020  PREOPERATIVE DIAGNOSIS:  Failed total knee from 25+ years.  POSTOPERATIVE DIAGNOSIS:  Failed total knee from 25+ years.  PROCEDURE: 1.  Left total knee revision of the tibial component and patella. 2.  Quadriceps tendon repair. 3.  Lateral retinacular release.  SURGEON:  Dorna Leitz, MD  ASSISTANT:  Harmon Dun, PA-C, who was present for the entire case and assisted by manipulation of the leg, retraction of tissues, and closing to minimize OR time.  BRIEF HISTORY:  The patient is an 86 year old female with a mobile bearing knee done 25+ years ago.  She has done great up until about 6 months ago when she began having pain with activity.  X-ray showed that the tibial component had fallen into a bit of  varus compared to previous x-rays and we treated her conservatively with a brace and activity modification.  She was really getting pretty miserable pain with every step and pain with activity.  We did infection workup, which was negative essentially  and with the radiographic changes, felt that a tibial revision was necessary.  Given the length of time that it had been in, we felt like we wanted to change out the poly on the patella as well.  She was brought to the operating room for this procedure.  DESCRIPTION OF PROCEDURE:  The patient was brought to the operating room after adequate anesthesia was obtained with a spinal anesthetic.  The patient was placed supine on the operating table.  Left leg was then prepped and draped in usual sterile  fashion.  Following this, the leg was exsanguinated, blood pressure tourniquet inflated to 300 mmHg.  Following this, a midline incision was made following her old incision subcutaneous tissue down to the level of the patella.  This was quite  a bit of a  soft tissue on the way down to the patella, but ultimately raised flaps medially and laterally on patella.  We did a medial arthrotomy and got into the knee and went around medially to do an extensive release to allow [place] to sublux the tibia forward.  We  then removed the mobile bearing.  There was a little bit of wear on each of them and at this point, the tibia was completely loose and could be just pulled out by hand.  We turned to the femur, did an extensive synovectomy around the margins.  There was  definitely some undercutting of the femur, but really when I got the drift punch on there and the handle, I could not really wiggle it and move it.  At that point, we turned to the patella where we took off the old poly that was there.  It certainly had  significant wear and then turned to the tibia.  Following removal of tibial component, we then did an intramedullary alignment and did a cleanup cut.  It was probably about 5 mm of bone and cement, removed all the cement.  With this cleanup cut really  got a nice platform of bone.  There was really no deficits or defects.  It was quite nice.  We then reamed down by hand, 12 reamer was grabbing a little bit of chatter, 13 really grabbed and at that point, I felt the 12 was going to be best and I think  we are going to make it to  14, so we put the cutting guide onto this intramedullary guide and did a cleanup cut.  Following this, we did a rotational alignment with the stem and drilled and so then, we felt like just given what we are seeing here, I  thought a cone was going to be a really nice fixation for her, so we did ream down and then hammered in a cone.  Once this was done, we assembled a 75 mm stem with a cone and then assembled the rotational alignment of the stem, irrigated the knee  thoroughly and completely removed all the excess metalosis and disease within the knee and once this was completed, we cemented in a size 3 tibial component  with a 75 mm stem and a 29 mm conical sleeve.  Once this was done, we got the rotation alignment  that had been set previously.  We matched it to that rotational alignment, opened a new poly, and put it on the old metal-backed patella.  We used a clamp to get it in place and could see whether rotation was there.  We then went to put her through a  range of motion and unfortunately, the thickness of the synovial tissue on the lateral side, the patella was now starting to track off the lateral side.  We did a lateral retinacular release.  Once this was done, we were able to get the patella to track  midline.  We trialed it with polys and got up to 17.5 and that seemed to give [place] the best stability.  We had used 5 mm augments on the stem as well.  Once that was done, we got excellent range of motion and stability.  We went back to the closure.  I  used 3 Ethibond interrupted sutures in the area of the quadriceps snip, which basically transecting the quad at the longitudinal angle.  Once this was done, we used #1 Vicryl in a running fashion halfway down and then used 1 Vicryl running from the  inferior portion of the wound ____ those and then tied that off.  Excellent range of motion and stability were achieved at this point.  Tourniquet had been let down previously at 74 minutes and we did a thorough irrigation of the knee at that point, no  significant bleeding was encountered.  We used Exparel and Marcaine throughout the knee for postoperative pain control.  Once we were at this point, she had quite a bit of tissue.  We did a 0 Vicryl layer, did 2-0 Vicryl layer and then a 2-0 running  subcuticular and then stapled her skin just for the concern of the revision procedure.  At this point, a sterile compressive dressing was applied and the patient was taken to the recovery room.  She was noted to be in satisfactory condition.  Estimated  blood loss for procedure was 50 mL.  HN/NUANCE  D:07/01/2020  T:07/02/2020 JOB:[id number]

## 2020-07-02 NOTE — Progress Notes (Signed)
Physical Therapy Treatment Patient Details Name: Samantha Stone MRN: [id number] DOB: [birth year] Today's Date: 07/02/2020    History of Present Illness Patient is 86 y.o. female s/p Lt TKR on 07/01/20 secondary to component loosening. PMH significant for HTN, OA, hypothyroidism, bil TKA with Rt revision in 2016.    PT Comments    Progressing with mobility. Reviewed exercises and gait training. Plan is for d/c to SNF on today.    Follow Up Recommendations  Follow surgeon's recommendation for DC plan and follow-up therapies (plan is for SNF)     Equipment Recommendations  Rolling walker with 5" wheels    Recommendations for Other Services       Precautions / Restrictions Precautions Precautions: Fall Restrictions Weight Bearing Restrictions: No LLE Weight Bearing: Weight bearing as tolerated    Mobility  Bed Mobility Overal bed mobility: Needs Assistance Bed Mobility: Supine to Sit     Supine to sit: Min assist;HOB elevated     General bed mobility comments: Cues for safety, technique. Assist for L LE. Increased time.  Transfers Overall transfer level: Needs assistance Equipment used: Rolling walker (2 wheeled) Transfers: Sit to/from Stand Sit to Stand: Min assist;From elevated surface         General transfer comment: VCs safety, technique, hand/LE placement. Assist to power up, stabilize controld descent.  Ambulation/Gait Ambulation/Gait assistance: Min assist Gait Distance (Feet): 20 Feet Assistive device: Rolling walker (2 wheeled) Gait Pattern/deviations: Step-to pattern;Decreased stride length     General Gait Details: Max VCs for sequencing. Assist to steady throughout distance. Slow gait speed. Pt fatigues fairly easily.   Stairs             Wheelchair Mobility    Modified Rankin (Stroke Patients Only)       Balance Overall balance assessment: Needs assistance         Standing balance support: Bilateral upper extremity  supported Standing balance-Leahy Scale: Poor                              Cognition Arousal/Alertness: Awake/alert Behavior During Therapy: WFL for tasks assessed/performed Overall Cognitive Status: Within Functional Limits for tasks assessed                                        Exercises Total Joint Exercises Ankle Circles/Pumps: AROM;Both;10 reps Quad Sets: AROM;Both;10 reps Heel Slides: AAROM;Left;10 reps Hip ABduction/ADduction: AAROM;Left;10 reps Straight Leg Raises: AAROM;Left;10 reps Goniometric ROM: ~10-55 degrees    General Comments        Pertinent Vitals/Pain Pain Assessment: 0-10 Pain Score: 5  Pain Location: L knee Pain Descriptors / Indicators: Discomfort;Sore;Aching Pain Intervention(s): Limited activity within patient's tolerance;Monitored during session;Repositioned    Home Living                      Prior Function            PT Goals (current goals can now be found in the care plan section) Progress towards PT goals: Progressing toward goals    Frequency    7X/week      PT Plan Current plan remains appropriate    Co-evaluation              AM-PAC PT "6 Clicks" Mobility   Outcome Measure  Help needed turning from your back to your  side while in a flat bed without using bedrails?: A Little Help needed moving from lying on your back to sitting on the side of a flat bed without using bedrails?: A Little Help needed moving to and from a bed to a chair (including a wheelchair)?: A Little Help needed standing up from a chair using your arms (e.g., wheelchair or bedside chair)?: A Little Help needed to walk in hospital room?: A Little Help needed climbing 3-5 steps with a railing? : A Lot 6 Click Score: 17    End of Session Equipment Utilized During Treatment: Gait belt Activity Tolerance: Patient tolerated treatment well Patient left: in chair;with call bell/phone within reach;with chair alarm  set   PT Visit Diagnosis: Other abnormalities of gait and mobility (R26.89);Pain Pain - Right/Left: Left Pain - part of body: Knee     Time: 5631-4970 PT Time Calculation (min) (ACUTE ONLY): 31 min  Charges:  $Gait Training: 8-22 mins $Therapeutic Exercise: 8-22 mins                         Doreatha Massed, PT Acute Rehabilitation  Office: [phone] Pager: [phone]

## 2020-07-02 NOTE — Progress Notes (Signed)
Subjective: 1 Day Post-Op Procedure(s) (LRB): TOTAL KNEE REVISION (Left)   Patient doing well. Minimal pain. She is hoping to go back to [facility] today.  Activity level:  wbat Diet tolerance:  ok Voiding:  ok Patient reports pain as mild.    Objective: Vital signs in last 24 hours: Temp:  [97.1 F (36.2 C)-98.2 F (36.8 C)] 98.2 F (36.8 C) (12/04 0800) Pulse Rate:  [55-85] 80 (12/04 0800) Resp:  [11-20] 18 (12/04 0800) BP: (138-188)/(65-89) 138/65 (12/04 0800) SpO2:  [93 %-100 %] 98 % (12/04 0800)  Labs: Recent Labs    07/02/20 0405  HGB 12.0   Recent Labs    07/02/20 0405  WBC 13.4*  RBC 4.08  HCT 37.6  PLT 203   No results for input(s): NA, K, CL, CO2, BUN, CREATININE, GLUCOSE, CALCIUM in the last 72 hours. No results for input(s): LABPT, INR in the last 72 hours.  Physical Exam:  Neurologically intact ABD soft Neurovascular intact Sensation intact distally Intact pulses distally Dorsiflexion/Plantar flexion intact Incision: dressing C/D/I and no drainage No cellulitis present Compartment soft  Assessment/Plan:  1 Day Post-Op Procedure(s) (LRB): TOTAL KNEE REVISION (Left) Advance diet Up with therapy D/C IV fluids  Discharge back to [facility] today if cleared by PT. Continue on ASA BID for DVT prevention.  Follow up with Dr. Berenice Primas as scheduled.   Larwance Sachs Camika Marsico 07/02/2020, 9:28 AM

## 2020-07-02 NOTE — Discharge Summary (Signed)
Patient ID: Samantha Stone MRN: [id number] DOB/AGE: [birth year] 86 y.o.  Admit date: 07/01/2020 Discharge date: 07/02/2020  Admission Diagnoses:  Principal Problem:   Status post revision of total replacement of left knee Active Problems:   Failed total left knee replacement [facility])   Discharge Diagnoses:  Same  Past Medical History:  Diagnosis Date  . Arthritis   . Cancer ([place])    basal cell skin cancer  . Concussion   . Dyspnea    upon exertion  . Hypertension   . Hypothyroidism   . Stress incontinence   . Total knee replacement status, bilateral     Surgeries: Procedure(s): TOTAL KNEE REVISION on 07/01/2020   Consultants:   Discharged Condition: Improved  Hospital Course: AMEERA TIGUE is an 86 y.o. female who was admitted 07/01/2020 for operative treatment ofStatus post revision of total replacement of left knee. Patient has severe unremitting pain that affects sleep, daily activities, and work/hobbies. After pre-op clearance the patient was taken to the operating room on 07/01/2020 and underwent  Procedure(s): [place].    Patient was given perioperative antibiotics:  Anti-infectives (From admission, onward)   Start     Dose/Rate Route Frequency Ordered Stop   07/01/20 1800  ceFAZolin (ANCEF) IVPB 2g/100 mL premix        2 g 200 mL/hr over 30 Minutes Intravenous Every 6 hours 07/01/20 1531 07/01/20 2347   07/01/20 0900  ceFAZolin (ANCEF) IVPB 2g/100 mL premix        2 g 200 mL/hr over 30 Minutes Intravenous On call to O.R. 07/01/20 0849 07/01/20 1137       Patient was given sequential compression devices, early ambulation, and chemoprophylaxis to prevent DVT.  Patient benefited maximally from hospital stay and there were no complications.    Recent vital signs:  Patient Vitals for the past 24 hrs:  BP Temp Temp src Pulse Resp SpO2  07/02/20 0800 138/65 98.2 F (36.8 C) Oral 80 18 98 %  07/02/20 0438 (!) 152/66 97.7 F (36.5 C) Oral 78 16 94  %  07/02/20 0004 (!) 156/67 98 F (36.7 C) -- 77 16 95 %  07/01/20 1949 (!) 152/72 97.7 F (36.5 C) Oral 82 16 97 %  07/01/20 1840 (!) 144/65 (!) 97.5 F (36.4 C) Oral 85 16 93 %  07/01/20 1540 (!) 181/86 (!) 97.1 F (36.2 C) Axillary 71 16 100 %  07/01/20 1537 (!) 164/81 97.8 F (36.6 C) Oral 67 17 99 %  07/01/20 1515 (!) 164/74 97.6 F (36.4 C) -- 67 18 98 %  07/01/20 1500 (!) 157/86 -- -- 68 16 99 %  07/01/20 1445 (!) 168/80 -- -- 69 11 100 %  07/01/20 1441 -- -- -- 72 20 97 %  07/01/20 1430 (!) 160/74 -- -- 68 18 96 %  07/01/20 1415 (!) 155/83 -- -- 67 18 100 %  07/01/20 1411 (!) 146/79 97.6 F (36.4 C) -- 66 16 100 %  07/01/20 1041 (!) 167/69 -- -- (!) 55 16 98 %  07/01/20 1005 (!) 188/78 -- -- 60 17 100 %  07/01/20 0959 (!) 158/89 -- -- 63 14 98 %     Recent laboratory studies:  Recent Labs    07/02/20 0405  WBC 13.4*  HGB 12.0  HCT 37.6  PLT 203     Discharge Medications:   Allergies as of 07/02/2020      Reactions   Phenergan [promethazine Hcl] Other (See Comments)   hallucinations  Lisinopril Cough      Medication List    TAKE these medications   acetaminophen 500 MG tablet Commonly known as: TYLENOL Take 1,000 mg by mouth every 6 (six) hours as needed for moderate pain.   aspirin EC 81 MG tablet Take 81 mg by mouth daily.   CALCIUM 600/VITAMIN D3 PO Take 1 tablet by mouth daily.   docusate sodium 100 MG capsule Commonly known as: COLACE Take 100 mg by mouth 2 (two) times daily.   ferrous sulfate 325 (65 FE) MG tablet Take 325 mg by mouth daily with breakfast.   folic acid 825 MCG tablet Commonly known as: FOLVITE Take 800 mcg by mouth daily.   levothyroxine 125 MCG tablet Commonly known as: SYNTHROID Take 1 tablet (125 mcg total) by mouth daily before breakfast.   losartan 100 MG tablet Commonly known as: COZAAR Take 0.5 tablets (50 mg total) by mouth daily.   metoprolol tartrate 25 MG tablet Commonly known as: LOPRESSOR TAKE 1  TABLET BY MOUTH TWICE A DAY WITH FOOD   pravastatin 80 MG tablet Commonly known as: PRAVACHOL Take 1 tablet (80 mg total) by mouth daily.   PRESERVISION AREDS 2 PO Take 1 capsule by mouth 2 (two) times daily.   traZODone 50 MG tablet Commonly known as: DESYREL TAKE 1 TABLET BY MOUTH EVERYDAY AT BEDTIME What changed:   how much to take  how to take this  when to take this  additional instructions   vitamin C 500 MG tablet Commonly known as: ASCORBIC ACID Take 500 mg by mouth daily.   Vitamin D-3 25 MCG (1000 UT) Caps Take 1,000 Units by mouth daily.            Durable Medical Equipment  (From admission, onward)         Start     Ordered   07/01/20 1532  DME Walker rolling  Once       Question:  Patient needs a walker to treat with the following condition  Answer:  Primary osteoarthritis of left knee   07/01/20 1531   07/01/20 1532  DME 3 n 1  Once        07/01/20 1531          Diagnostic Studies: DG Chest 2 View  Result Date: 06/20/2020 CLINICAL DATA:  Preop for total knee replacement. EXAM: CHEST - 2 VIEW COMPARISON:  August 27, 2018. FINDINGS: Stable cardiomegaly. Both lungs are clear. The visualized skeletal structures are unremarkable. IMPRESSION: No active cardiopulmonary disease. Aortic Atherosclerosis (ICD10-I70.0). Electronically Signed   By: Marijo Conception M.D.   On: 06/20/2020 16:48    Disposition: Discharge disposition: 03-Skilled Nursing Facility       Discharge Instructions    Call MD / Call 911   Complete by: As directed    If you experience chest pain or shortness of breath, CALL 911 and be transported to the hospital emergency room.  If you develope a fever above 101 F, pus (white drainage) or increased drainage or redness at the wound, or calf pain, call your surgeon's office.   Constipation Prevention   Complete by: As directed    Drink plenty of fluids.  Prune juice may be helpful.  You may use a stool softener, such as Colace  (over the counter) 100 mg twice a day.  Use MiraLax (over the counter) for constipation as needed.   Diet - low sodium heart healthy   Complete by: As directed  Increase activity slowly as tolerated   Complete by: As directed        Contact information for after-discharge care    Destination    [facility] .   Service: Skilled Chiropodist information: [place] [phone]                   Signed: Larwance Sachs Linford Quintela 07/02/2020, 9:33 AM

## 2020-07-02 NOTE — Care Management Obs Status (Signed)
[place] NOTIFICATION   Patient Details  Name: MAUDE HETTICH MRN: [id number] Date of Birth: 04-17-1931   Medicare Observation Status Notification Given:  Yes    Ross Ludwig, LCSW 07/02/2020, 11:03 AM

## 2020-07-02 NOTE — TOC Transition Note (Signed)
Transition of Care [facility]) - CM/SW Discharge Note   Patient Details  Name: Samantha Stone MRN: [id number] Date of Birth: November 25, 1930  Transition [facility] CM/SW Contact:  Ross Ludwig, LCSW Phone Number: 07/02/2020, 11:00 AM   Clinical Narrative:     Patient to be d/c'ed today to [facility].  Patient and family agreeable to plans will transport via ems RN to call report to [phone] and ask for [facility].  CSW spoke to patient's daughter and she is aware that patient will be discharging today.  Final next level of care: Skilled Nursing Facility Barriers to Discharge: Barriers Resolved   Patient Goals and CMS Choice Patient states their goals for this hospitalization and ongoing recovery are:: To plan to return back to [facility] rehab. CMS Medicare.gov Compare Post Acute Care list provided to:: Patient Represenative (must comment) Choice offered to / list presented to : Adult Children  Discharge Placement   Existing PASRR number confirmed : 07/01/20          Patient chooses bed at: [facility] Patient to be transferred to facility by: [facility] Name of family member notified: Patient's daughter [name] Patient and family notified of of transfer: 07/02/20  Discharge Plan and Services In-house Referral: Clinical Social Work   Post Acute Care Choice: [place]                               Social Determinants of Health (SDOH) Interventions     Readmission Risk Interventions No flowsheet data found.

## 2020-07-04 ENCOUNTER — Encounter (HOSPITAL_COMMUNITY): Payer: Self-pay | Admitting: Orthopedic Surgery

## 2020-07-04 DIAGNOSIS — Z4733 Aftercare following explantation of knee joint prosthesis: Secondary | ICD-10-CM | POA: Diagnosis not present

## 2020-07-04 DIAGNOSIS — M1712 Unilateral primary osteoarthritis, left knee: Secondary | ICD-10-CM | POA: Diagnosis not present

## 2020-07-04 DIAGNOSIS — R2689 Other abnormalities of gait and mobility: Secondary | ICD-10-CM | POA: Diagnosis not present

## 2020-07-04 DIAGNOSIS — M62562 Muscle wasting and atrophy, not elsewhere classified, left lower leg: Secondary | ICD-10-CM | POA: Diagnosis not present

## 2020-07-04 DIAGNOSIS — E7800 Pure hypercholesterolemia, unspecified: Secondary | ICD-10-CM | POA: Diagnosis not present

## 2020-07-04 DIAGNOSIS — M25562 Pain in left knee: Secondary | ICD-10-CM | POA: Diagnosis not present

## 2020-07-04 DIAGNOSIS — M6389 Disorders of muscle in diseases classified elsewhere, multiple sites: Secondary | ICD-10-CM | POA: Diagnosis not present

## 2020-07-04 DIAGNOSIS — R278 Other lack of coordination: Secondary | ICD-10-CM | POA: Diagnosis not present

## 2020-07-04 LAB — BASIC METABOLIC PANEL
BUN: 14 (ref 4–21)
CO2: 27 — AB (ref 13–22)
Chloride: 99 (ref 99–108)
Creatinine: 0.7 (ref 0.5–1.1)
Glucose: 91
Potassium: 3.9 (ref 3.4–5.3)
Sodium: 135 — AB (ref 137–147)

## 2020-07-04 LAB — COMPREHENSIVE METABOLIC PANEL: Calcium: 8.9 (ref 8.7–10.7)

## 2020-07-05 ENCOUNTER — Encounter: Payer: Self-pay | Admitting: Internal Medicine

## 2020-07-05 ENCOUNTER — Non-Acute Institutional Stay (SKILLED_NURSING_FACILITY): Payer: Medicare Other | Admitting: Internal Medicine

## 2020-07-05 DIAGNOSIS — R2689 Other abnormalities of gait and mobility: Secondary | ICD-10-CM | POA: Diagnosis not present

## 2020-07-05 DIAGNOSIS — T84038S Mechanical loosening of other internal prosthetic joint, sequela: Secondary | ICD-10-CM

## 2020-07-05 DIAGNOSIS — I5032 Chronic diastolic (congestive) heart failure: Secondary | ICD-10-CM

## 2020-07-05 DIAGNOSIS — Z96652 Presence of left artificial knee joint: Secondary | ICD-10-CM | POA: Diagnosis not present

## 2020-07-05 DIAGNOSIS — E039 Hypothyroidism, unspecified: Secondary | ICD-10-CM

## 2020-07-05 DIAGNOSIS — M6389 Disorders of muscle in diseases classified elsewhere, multiple sites: Secondary | ICD-10-CM | POA: Diagnosis not present

## 2020-07-05 DIAGNOSIS — R410 Disorientation, unspecified: Secondary | ICD-10-CM

## 2020-07-05 DIAGNOSIS — D62 Acute posthemorrhagic anemia: Secondary | ICD-10-CM

## 2020-07-05 DIAGNOSIS — L03116 Cellulitis of left lower limb: Secondary | ICD-10-CM

## 2020-07-05 DIAGNOSIS — E7800 Pure hypercholesterolemia, unspecified: Secondary | ICD-10-CM

## 2020-07-05 DIAGNOSIS — Z4733 Aftercare following explantation of knee joint prosthesis: Secondary | ICD-10-CM | POA: Diagnosis not present

## 2020-07-05 DIAGNOSIS — M62562 Muscle wasting and atrophy, not elsewhere classified, left lower leg: Secondary | ICD-10-CM | POA: Diagnosis not present

## 2020-07-05 DIAGNOSIS — K5904 Chronic idiopathic constipation: Secondary | ICD-10-CM | POA: Diagnosis not present

## 2020-07-05 DIAGNOSIS — Z96659 Presence of unspecified artificial knee joint: Secondary | ICD-10-CM

## 2020-07-05 DIAGNOSIS — M25562 Pain in left knee: Secondary | ICD-10-CM | POA: Diagnosis not present

## 2020-07-05 DIAGNOSIS — M1712 Unilateral primary osteoarthritis, left knee: Secondary | ICD-10-CM | POA: Diagnosis not present

## 2020-07-05 DIAGNOSIS — R278 Other lack of coordination: Secondary | ICD-10-CM | POA: Diagnosis not present

## 2020-07-05 DIAGNOSIS — J45991 Cough variant asthma: Secondary | ICD-10-CM

## 2020-07-05 NOTE — Progress Notes (Signed)
Patient ID: Samantha Stone, female   DOB: 1931/06/14, 86 y.o.   MRN: [id number]  Provider:  Rexene Edison. Mariea Clonts, D.O., C.M.D. Location:  [facility] Room Number: 156 Place of Service:     PCP: Gayland Curry, DO Patient Care Team: Gayland Curry, DO as PCP - General (Geriatric Medicine) Nahser, Wonda Cheng, MD as PCP - Cardiology (Cardiology) Tania Ade, MD as Consulting Physician (Orthopedic Surgery) Nahser, Wonda Cheng, MD as Consulting Physician (Cardiology) Darleen Crocker, MD as Consulting Physician (Ophthalmology) Irine Seal, MD as Attending Physician (Urology) Dorna Leitz, MD as Consulting Physician (Orthopedic Surgery) Tanda Rockers, MD as Consulting Physician (Pulmonary Disease)  Extended Emergency Contact Information Primary Emergency Contact: [name],[name] Address: [address]          [place], [place] [address] [place] Phone: [phone] Mobile Phone: [phone] Relation: Daughter Secondary Emergency Contact: Hipolito Bayley Address: [address]          [place], [place] [address] [place] of [place] Phone: [phone] Mobile Phone: [phone] Relation: Son  Code Status: DNR Goals of Care: Advanced Directive information Advanced Directives 07/05/2020  Does Patient Have a Medical Advance Directive? Yes  Type of Paramedic of [place];Living will  Does patient want to make changes to medical advance directive? No - Patient declined  Copy of [place] in Chart? No - copy requested  Pre-existing out of facility DNR order (yellow form or pink MOST form) Pink MOST/Yellow Form most recent copy in chart - Physician notified to receive inpatient order      Chief Complaint  Patient presents with  . New Admit To SNF    S/P L total knee arthroplasty    HPI: Patient is a 86 y.o. female seen today for admission to [facility] s/p hospitalization for left  total knee revision with Dr. Berenice Primas.  I had seen her in clinic prior to this and communicated with Dr. Acie Fredrickson, her cardiologist, and we'd determined she should do fine with her procedure.    She underwent the left total knee revision on 12/3.  She had been noted by nursing to have increased erythema, warmth, and swelling laterally and ortho office was called.  She was actually seen earlier today and started on keflex 525m po tid for 5 days.    When I saw her, she explained to me that she'd been quite delirious, as well, on Sat night 12/5 upon coming to rehab from the hospital.  She thought there were little people in her room and was very restless.  She wound up asking nursing to bring her out by the desk.  She also called her daughter who tried to reassure her.    She feels a lot better today (Tuesday).  She is clear-minded.  She is using her CPM machine.  She admits to pain in the knee but it's relieved with the oxycodone.  There is erythema, swelling, warmth and some drainage on the aquacel.    Past Medical History:  Diagnosis Date  . Arthritis   . Cancer ([place]    basal cell skin cancer  . Concussion   . Dyspnea    upon exertion  . Hypertension   . Hypothyroidism   . Stress incontinence   . Total knee replacement status, bilateral    Past Surgical History:  Procedure Laterality Date  . ABDOMINAL HYSTERECTOMY    . APPENDECTOMY    . CARPAL TUNNEL RELEASE  rt x2  . COLONOSCOPY    . EYE SURGERY Bilateral    cataract surgery with lens implant  . FINGER AMPUTATION Right    little finger  . I & D EXTREMITY  03/11/2012   Procedure: MINOR IRRIGATION AND DEBRIDEMENT EXTREMITY;  Surgeon: Cammie Sickle., MD;  Location: [facility];  Service: Orthopedics;  Laterality: Right;  Right little finger  . KNEE ARTHROSCOPY     bilat   . REPLACEMENT TOTAL KNEE BILATERAL    . REVERSE SHOULDER ARTHROPLASTY Right 09/04/2018   Procedure: REVERSE SHOULDER ARTHROPLASTY;  Surgeon:  Tania Ade, MD;  Location: [facility];  Service: Orthopedics;  Laterality: Right;  . TONSILLECTOMY    . TOTAL KNEE REVISION Right 03/14/2015   Procedure: TIBIAL REVISION, OPEN REDUCTION INTERNAL FIXATION PROXIMAL TIBIA;  Surgeon: Dorna Leitz, MD;  Location: [facility];  Service: Orthopedics;  Laterality: Right;  . TOTAL KNEE REVISION Left 07/01/2020   Procedure: TOTAL KNEE REVISION;  Surgeon: Dorna Leitz, MD;  Location: [facility];  Service: Orthopedics;  Laterality: Left;  . TRIGGER FINGER RELEASE    . TUBAL LIGATION      Social History   Socioeconomic History  . Marital status: Widowed    Spouse name: Not on file  . Number of children: 4  . Years of education: Not on file  . Highest education level: Not on file  Occupational History  . Occupation: Retired Therapist, sports  Tobacco Use  . Smoking status: Never Smoker  . Smokeless tobacco: Never Used  Vaping Use  . Vaping Use: Never used  Substance and Sexual Activity  . Alcohol use: Yes    Alcohol/week: 14.0 standard drinks    Types: 14 Cans of beer per week    Comment: "couple" beers daily  . Drug use: No  . Sexual activity: Not on file  Other Topics Concern  . Not on file  Social History Narrative   Tobacco use, amount per day now: NEVER   Past tobacco use, amount per day:   How many years did you use tobacco:   Alcohol use (drinks per week): BEER COUPLE A DAY   Diet: REGULAR   Do you drink/eat things with caffeine: COFFEE ONCE A DAY   Marital status:   WIDOW                               What year were you married? 1ST TIME 1956 DIED 2ND 1987 DIED 16-Oct-2013   Do you live in a house, apartment, assisted living, condo, trailer, etc.? APARTMENT   Is it one or more stories? ONE   How many persons live in your home? ONE   Do you have pets in your home?( please list) [place]   Current or past profession: RN   Do you exercise?        NOT OFTEN ENOUGH                          Type and how often? BEFORE SHOULDER SURG, I WALKED AND SWAM    Do you have a living will? YES   Do you have a DNR form?   YES                                If not, do you want to discuss one?  Do you have signed POA/HPOA forms?      YES                   If so, please bring to you appointment   Social Determinants of Health   Financial Resource Strain: Not on file  Food Insecurity: Not on file  Transportation Needs: Not on file  Physical Activity: Not on file  Stress: Not on file  Social Connections: Not on file    reports that she has never smoked. She has never used smokeless tobacco. She reports current alcohol use of about 14.0 standard drinks of alcohol per week. She reports that she does not use drugs.  Functional Status Survey:    Family History  Problem Relation Age of Onset  . Bladder Cancer Father     Health Maintenance  Topic Date Due  . PNA vac Low Risk Adult (2 of 2 - PPSV23) 02/28/2016  . TETANUS/TDAP  02/26/2030  . INFLUENZA VACCINE  Completed  . DEXA SCAN  Completed  . COVID-19 Vaccine  Completed    Allergies  Allergen Reactions  . Phenergan [Promethazine Hcl] Other (See Comments)    hallucinations  . Lisinopril Cough    Outpatient Encounter Medications as of 07/05/2020  Medication Sig  . acetaminophen (TYLENOL) 500 MG tablet Take 1,000 mg by mouth every 6 (six) hours as needed for moderate pain.  Marland Kitchen aspirin EC 81 MG tablet Take 81 mg by mouth daily.  . Calcium Carb-Cholecalciferol (CALCIUM 600/VITAMIN D3 PO) Take 1 tablet by mouth daily.  . cephALEXin (KEFLEX) 500 MG capsule Take 500 mg by mouth 3 (three) times daily. TID  x 5 days  . Cholecalciferol (VITAMIN D-3) 1000 units CAPS Take 1,000 Units by mouth daily.   Marland Kitchen docusate sodium (COLACE) 100 MG capsule Take 100 mg by mouth 2 (two) times daily.  . ferrous sulfate 325 (65 FE) MG tablet Take 325 mg by mouth daily with breakfast.   . folic acid (FOLVITE) 098 MCG tablet Take 800 mcg by mouth daily.   Marland Kitchen levothyroxine (SYNTHROID) 125 MCG tablet Take 1 tablet (125 mcg  total) by mouth daily before breakfast.  . losartan (COZAAR) 100 MG tablet Take 0.5 tablets (50 mg total) by mouth daily.  . metoprolol tartrate (LOPRESSOR) 25 MG tablet TAKE 1 TABLET BY MOUTH TWICE A DAY WITH FOOD  . Multiple Vitamins-Minerals (PRESERVISION AREDS 2 PO) Take 1 capsule by mouth 2 (two) times daily.  Marland Kitchen oxyCODONE (OXY IR/ROXICODONE) 5 MG immediate release tablet Take 5 mg by mouth every 4 (four) hours as needed for severe pain.  . polyethylene glycol (MIRALAX / GLYCOLAX) 17 g packet Take 17 g by mouth daily.  . pravastatin (PRAVACHOL) 80 MG tablet Take 1 tablet (80 mg total) by mouth daily.  . traZODone (DESYREL) 50 MG tablet TAKE 1 TABLET BY MOUTH EVERYDAY AT BEDTIME  . vitamin C (ASCORBIC ACID) 500 MG tablet Take 500 mg by mouth daily.  . [DISCONTINUED] oxyCODONE-acetaminophen (PERCOCET/ROXICET) 5-325 MG tablet Take 1 tablet by mouth every 6 (six) hours as needed for severe pain.   No facility-administered encounter medications on file as of 07/05/2020.    Review of Systems  Constitutional: Negative for chills, fever and malaise/fatigue.  HENT: Negative for congestion and sore throat.   Eyes: Negative for blurred vision.  Respiratory: Negative for cough and shortness of breath.   Cardiovascular: Negative for chest pain, palpitations and leg swelling.  Gastrointestinal: Negative for abdominal pain, blood in stool, constipation  and melena.       Bowels moving--has had incontinence  Genitourinary: Negative for dysuria.  Musculoskeletal: Positive for joint pain. Negative for falls.  Skin: Negative for itching and rash.  Neurological: Negative for dizziness and loss of consciousness.  Endo/Heme/Allergies: Bruises/bleeds easily.  Psychiatric/Behavioral: Negative for depression and memory loss. The patient is not nervous/anxious and does not have insomnia.     Vitals:   07/05/20 1506  BP: (!) 149/78  Pulse: (!) 102  Temp: 98.9 F (37.2 C)  SpO2: 96%  Weight: 246 lb (111.6  kg)  Height: 5' 4"  (1.626 m)   Body mass index is 42.23 kg/m. Physical Exam Vitals reviewed.  Constitutional:      General: She is not in acute distress.    Appearance: Normal appearance. She is obese. She is not ill-appearing or toxic-appearing.  HENT:     Head: Normocephalic and atraumatic.     Right Ear: External ear normal.     Left Ear: External ear normal.     Nose: Nose normal.     Mouth/Throat:     Pharynx: Oropharynx is clear.  Eyes:     Extraocular Movements: Extraocular movements intact.     Conjunctiva/sclera: Conjunctivae normal.     Pupils: Pupils are equal, round, and reactive to light.  Cardiovascular:     Rate and Rhythm: Normal rate and regular rhythm.     Heart sounds: No murmur heard.   Pulmonary:     Effort: Pulmonary effort is normal.     Breath sounds: Normal breath sounds. No rales.  Abdominal:     General: Bowel sounds are normal. There is no distension.     Palpations: Abdomen is soft.     Tenderness: There is no abdominal tenderness. There is no guarding or rebound.  Musculoskeletal:        General: Swelling and tenderness present.     Cervical back: Neck supple.     Right lower leg: No edema.     Left lower leg: No edema.     Comments: Left knee lateral to incision site erythema, warmth, swelling, some drainage on aquacel; using CPM when I entered  Lymphadenopathy:     Cervical: No cervical adenopathy.  Neurological:     General: No focal deficit present.     Mental Status: She is alert and oriented to person, place, and time.     Cranial Nerves: No cranial nerve deficit.     Sensory: No sensory deficit.     Motor: No weakness.     Coordination: Coordination normal.     Gait: Gait abnormal.     Deep Tendon Reflexes: Reflexes normal.  Psychiatric:        Mood and Affect: Mood normal.     Labs reviewed: Basic Metabolic Panel: Recent Labs    12/30/19 1127 02/02/20 0000 06/20/20 1143 07/04/20 0000  NA 137 138 137 135*  K 4.4 4.6  5.4* 3.9  CL 104 102 101 99  CO2 32 24* 29 27*  GLUCOSE 116*  --  108*  --   BUN 16 18 18 14   CREATININE 0.88 0.7 0.90 0.7  CALCIUM 9.4 9.5 9.4 8.9   Liver Function Tests: Recent Labs    08/11/19 0500 11/12/19 0300  AST  --  17  ALT  --  14  ALBUMIN 3.9 3.6   No results for input(s): LIPASE, AMYLASE in the last 8760 hours. No results for input(s): AMMONIA in the last 8760 hours. CBC: Recent  Labs    02/02/20 0000 06/20/20 1143 07/02/20 0405  WBC 5.0 6.2 13.4*  HGB 12.7 12.9 12.0  HCT 38 40.1 37.6  MCV  --  90.3 92.2  PLT 256 273 203   Cardiac Enzymes: No results for input(s): CKTOTAL, CKMB, CKMBINDEX, TROPONINI in the last 8760 hours. BNP: Invalid input(s): POCBNP Lab Results  Component Value Date   HGBA1C 5.5 06/16/2014   Lab Results  Component Value Date   TSH 0.08 (A) 08/11/2019   Lab Results  Component Value Date   NBVAPOLI10 301 06/16/2014   No results found for: FOLATE No results found for: IRON, TIBC, FERRITIN  Imaging and Procedures obtained prior to SNF admission: No results found.  Assessment/Plan 1. Mechanical loosening of prosthetic knee, sequela (HCC) -per imaging -so underwent left total knee revision  2. Status post revision of total replacement of left knee -here for PT, OT, pain mgt, CPM use with goal to return to [place]  3. Morbid (severe) obesity due to excess calories ([place]) -ongoing, has several snacks like cookies and other sweets in her room  4. Chronic diastolic congestive heart failure ([place]) -monitor weights here and notify per chf protocol if up by 3 lbs in 1 day or 5 lbs in a week -cont home meds  5. Chronic idiopathic constipation -cont current regimen which has been effective, hold for loose stools or too frequent stools  6. Pure hypercholesterolemia -cont home pravachol  7. Hypothyroidism, unspecified type -cont home levothyroxine  8. Cough variant asthma -longstanding, no change  9. Delirium -due to surgery,  change in location, pain, etc -maintain days/nights and reorient -treating soft tissue infection that hopefully will not be more than that -cultures from old knee replacement parts were negative for infection/no growth  10. Cellulitis of left lower extremity -complete course of keflex -if erythema persists, will need cbc rechecked, ESR, and f/u with ortho  11.  Postop acute blood loss anemia -f/u cbc -on iron supplement daily now  Family/ staff Communication: d/w rehab nursing  Labs/tests ordered:  Cbc, bmp should be done at one week  Mamadou Breon L. Regie Bunner, D.O. [facility] [address], [place] [address] Cell Phone (Mon-Fri 8am-5pm):  [phone] On Call:  [phone] & follow prompts after 5pm & weekends Office Phone:  [phone] Office Fax:  [phone]

## 2020-07-06 DIAGNOSIS — Z4733 Aftercare following explantation of knee joint prosthesis: Secondary | ICD-10-CM | POA: Diagnosis not present

## 2020-07-06 DIAGNOSIS — M25562 Pain in left knee: Secondary | ICD-10-CM | POA: Diagnosis not present

## 2020-07-06 DIAGNOSIS — R278 Other lack of coordination: Secondary | ICD-10-CM | POA: Diagnosis not present

## 2020-07-06 DIAGNOSIS — M62562 Muscle wasting and atrophy, not elsewhere classified, left lower leg: Secondary | ICD-10-CM | POA: Diagnosis not present

## 2020-07-06 DIAGNOSIS — R2689 Other abnormalities of gait and mobility: Secondary | ICD-10-CM | POA: Diagnosis not present

## 2020-07-06 DIAGNOSIS — M1712 Unilateral primary osteoarthritis, left knee: Secondary | ICD-10-CM | POA: Diagnosis not present

## 2020-07-06 DIAGNOSIS — M6389 Disorders of muscle in diseases classified elsewhere, multiple sites: Secondary | ICD-10-CM | POA: Diagnosis not present

## 2020-07-06 LAB — AEROBIC/ANAEROBIC CULTURE W GRAM STAIN (SURGICAL/DEEP WOUND)
Culture: NO GROWTH
Gram Stain: NONE SEEN

## 2020-07-08 DIAGNOSIS — Z4733 Aftercare following explantation of knee joint prosthesis: Secondary | ICD-10-CM | POA: Diagnosis not present

## 2020-07-08 DIAGNOSIS — M62562 Muscle wasting and atrophy, not elsewhere classified, left lower leg: Secondary | ICD-10-CM | POA: Diagnosis not present

## 2020-07-08 DIAGNOSIS — M25562 Pain in left knee: Secondary | ICD-10-CM | POA: Diagnosis not present

## 2020-07-08 DIAGNOSIS — R278 Other lack of coordination: Secondary | ICD-10-CM | POA: Diagnosis not present

## 2020-07-08 DIAGNOSIS — M1712 Unilateral primary osteoarthritis, left knee: Secondary | ICD-10-CM | POA: Diagnosis not present

## 2020-07-08 DIAGNOSIS — M6389 Disorders of muscle in diseases classified elsewhere, multiple sites: Secondary | ICD-10-CM | POA: Diagnosis not present

## 2020-07-08 DIAGNOSIS — R2689 Other abnormalities of gait and mobility: Secondary | ICD-10-CM | POA: Diagnosis not present

## 2020-07-09 DIAGNOSIS — M6389 Disorders of muscle in diseases classified elsewhere, multiple sites: Secondary | ICD-10-CM | POA: Diagnosis not present

## 2020-07-09 DIAGNOSIS — M1712 Unilateral primary osteoarthritis, left knee: Secondary | ICD-10-CM | POA: Diagnosis not present

## 2020-07-09 DIAGNOSIS — Z4733 Aftercare following explantation of knee joint prosthesis: Secondary | ICD-10-CM | POA: Diagnosis not present

## 2020-07-09 DIAGNOSIS — M25562 Pain in left knee: Secondary | ICD-10-CM | POA: Diagnosis not present

## 2020-07-09 DIAGNOSIS — M62562 Muscle wasting and atrophy, not elsewhere classified, left lower leg: Secondary | ICD-10-CM | POA: Diagnosis not present

## 2020-07-09 DIAGNOSIS — R2689 Other abnormalities of gait and mobility: Secondary | ICD-10-CM | POA: Diagnosis not present

## 2020-07-09 DIAGNOSIS — R278 Other lack of coordination: Secondary | ICD-10-CM | POA: Diagnosis not present

## 2020-07-10 DIAGNOSIS — Z4733 Aftercare following explantation of knee joint prosthesis: Secondary | ICD-10-CM | POA: Diagnosis not present

## 2020-07-10 DIAGNOSIS — R2689 Other abnormalities of gait and mobility: Secondary | ICD-10-CM | POA: Diagnosis not present

## 2020-07-10 DIAGNOSIS — R278 Other lack of coordination: Secondary | ICD-10-CM | POA: Diagnosis not present

## 2020-07-10 DIAGNOSIS — M62562 Muscle wasting and atrophy, not elsewhere classified, left lower leg: Secondary | ICD-10-CM | POA: Diagnosis not present

## 2020-07-10 DIAGNOSIS — M25562 Pain in left knee: Secondary | ICD-10-CM | POA: Diagnosis not present

## 2020-07-10 DIAGNOSIS — M6389 Disorders of muscle in diseases classified elsewhere, multiple sites: Secondary | ICD-10-CM | POA: Diagnosis not present

## 2020-07-10 DIAGNOSIS — M1712 Unilateral primary osteoarthritis, left knee: Secondary | ICD-10-CM | POA: Diagnosis not present

## 2020-07-11 ENCOUNTER — Encounter: Payer: Self-pay | Admitting: Internal Medicine

## 2020-07-11 DIAGNOSIS — M25562 Pain in left knee: Secondary | ICD-10-CM | POA: Diagnosis not present

## 2020-07-11 DIAGNOSIS — M6389 Disorders of muscle in diseases classified elsewhere, multiple sites: Secondary | ICD-10-CM | POA: Diagnosis not present

## 2020-07-11 DIAGNOSIS — Z4733 Aftercare following explantation of knee joint prosthesis: Secondary | ICD-10-CM | POA: Diagnosis not present

## 2020-07-11 DIAGNOSIS — M1712 Unilateral primary osteoarthritis, left knee: Secondary | ICD-10-CM | POA: Diagnosis not present

## 2020-07-11 DIAGNOSIS — M62562 Muscle wasting and atrophy, not elsewhere classified, left lower leg: Secondary | ICD-10-CM | POA: Diagnosis not present

## 2020-07-11 DIAGNOSIS — R2689 Other abnormalities of gait and mobility: Secondary | ICD-10-CM | POA: Diagnosis not present

## 2020-07-11 DIAGNOSIS — R278 Other lack of coordination: Secondary | ICD-10-CM | POA: Diagnosis not present

## 2020-07-12 ENCOUNTER — Other Ambulatory Visit: Payer: Self-pay | Admitting: Internal Medicine

## 2020-07-12 DIAGNOSIS — Z96652 Presence of left artificial knee joint: Secondary | ICD-10-CM

## 2020-07-12 MED ORDER — OXYCODONE HCL 5 MG PO TABS: 5.0000 mg | ORAL_TABLET | ORAL | 0 refills | Status: AC | PRN

## 2020-07-12 NOTE — Op Note (Signed)
NAME: Samantha Stone, Samantha Stone MEDICAL RECORD TQ:[id number] ACCOUNT [id number] DATE OF BIRTH:12-Jul-1931 FACILITY: [facility] LOCATION: [id number] PHYSICIAN:Eevee Borbon Maudie Mercury, MD  OPERATIVE REPORT  DATE OF PROCEDURE:  07/01/2020  ADDENDUM  PREOPERATIVE DIAGNOSES: 1.  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 2.  Painful hardware, left knee.  POSTOPERATIVE DIAGNOSES: 1.  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 2.  Painful hardware, left knee.  HN/NUANCE  D:07/12/2020 T:07/12/2020 JOB:[id number]

## 2020-07-13 DIAGNOSIS — R278 Other lack of coordination: Secondary | ICD-10-CM | POA: Diagnosis not present

## 2020-07-13 DIAGNOSIS — M1712 Unilateral primary osteoarthritis, left knee: Secondary | ICD-10-CM | POA: Diagnosis not present

## 2020-07-13 DIAGNOSIS — Z4733 Aftercare following explantation of knee joint prosthesis: Secondary | ICD-10-CM | POA: Diagnosis not present

## 2020-07-13 DIAGNOSIS — M62562 Muscle wasting and atrophy, not elsewhere classified, left lower leg: Secondary | ICD-10-CM | POA: Diagnosis not present

## 2020-07-13 DIAGNOSIS — M25562 Pain in left knee: Secondary | ICD-10-CM | POA: Diagnosis not present

## 2020-07-13 DIAGNOSIS — M6389 Disorders of muscle in diseases classified elsewhere, multiple sites: Secondary | ICD-10-CM | POA: Diagnosis not present

## 2020-07-13 DIAGNOSIS — R2689 Other abnormalities of gait and mobility: Secondary | ICD-10-CM | POA: Diagnosis not present

## 2020-07-14 DIAGNOSIS — M6389 Disorders of muscle in diseases classified elsewhere, multiple sites: Secondary | ICD-10-CM | POA: Diagnosis not present

## 2020-07-14 DIAGNOSIS — Z96652 Presence of left artificial knee joint: Secondary | ICD-10-CM | POA: Diagnosis not present

## 2020-07-14 DIAGNOSIS — M1712 Unilateral primary osteoarthritis, left knee: Secondary | ICD-10-CM | POA: Diagnosis not present

## 2020-07-14 DIAGNOSIS — M62562 Muscle wasting and atrophy, not elsewhere classified, left lower leg: Secondary | ICD-10-CM | POA: Diagnosis not present

## 2020-07-14 DIAGNOSIS — Z4733 Aftercare following explantation of knee joint prosthesis: Secondary | ICD-10-CM | POA: Diagnosis not present

## 2020-07-14 DIAGNOSIS — R278 Other lack of coordination: Secondary | ICD-10-CM | POA: Diagnosis not present

## 2020-07-14 DIAGNOSIS — R2689 Other abnormalities of gait and mobility: Secondary | ICD-10-CM | POA: Diagnosis not present

## 2020-07-14 DIAGNOSIS — Z9889 Other specified postprocedural states: Secondary | ICD-10-CM | POA: Diagnosis not present

## 2020-07-14 DIAGNOSIS — M25562 Pain in left knee: Secondary | ICD-10-CM | POA: Diagnosis not present

## 2020-07-15 DIAGNOSIS — Z4733 Aftercare following explantation of knee joint prosthesis: Secondary | ICD-10-CM | POA: Diagnosis not present

## 2020-07-15 DIAGNOSIS — R2689 Other abnormalities of gait and mobility: Secondary | ICD-10-CM | POA: Diagnosis not present

## 2020-07-15 DIAGNOSIS — M62562 Muscle wasting and atrophy, not elsewhere classified, left lower leg: Secondary | ICD-10-CM | POA: Diagnosis not present

## 2020-07-15 DIAGNOSIS — M25562 Pain in left knee: Secondary | ICD-10-CM | POA: Diagnosis not present

## 2020-07-15 DIAGNOSIS — M1712 Unilateral primary osteoarthritis, left knee: Secondary | ICD-10-CM | POA: Diagnosis not present

## 2020-07-15 DIAGNOSIS — R278 Other lack of coordination: Secondary | ICD-10-CM | POA: Diagnosis not present

## 2020-07-15 DIAGNOSIS — M6389 Disorders of muscle in diseases classified elsewhere, multiple sites: Secondary | ICD-10-CM | POA: Diagnosis not present

## 2020-07-18 ENCOUNTER — Non-Acute Institutional Stay (SKILLED_NURSING_FACILITY): Payer: Medicare Other | Admitting: Adult Health

## 2020-07-18 DIAGNOSIS — T84038S Mechanical loosening of other internal prosthetic joint, sequela: Secondary | ICD-10-CM

## 2020-07-18 DIAGNOSIS — F5101 Primary insomnia: Secondary | ICD-10-CM | POA: Diagnosis not present

## 2020-07-18 DIAGNOSIS — Z96652 Presence of left artificial knee joint: Secondary | ICD-10-CM

## 2020-07-18 DIAGNOSIS — M62562 Muscle wasting and atrophy, not elsewhere classified, left lower leg: Secondary | ICD-10-CM | POA: Diagnosis not present

## 2020-07-18 DIAGNOSIS — M1712 Unilateral primary osteoarthritis, left knee: Secondary | ICD-10-CM | POA: Diagnosis not present

## 2020-07-18 DIAGNOSIS — K5901 Slow transit constipation: Secondary | ICD-10-CM | POA: Diagnosis not present

## 2020-07-18 DIAGNOSIS — M25562 Pain in left knee: Secondary | ICD-10-CM | POA: Diagnosis not present

## 2020-07-18 DIAGNOSIS — Z4733 Aftercare following explantation of knee joint prosthesis: Secondary | ICD-10-CM | POA: Diagnosis not present

## 2020-07-18 DIAGNOSIS — R2689 Other abnormalities of gait and mobility: Secondary | ICD-10-CM | POA: Diagnosis not present

## 2020-07-18 DIAGNOSIS — R4589 Other symptoms and signs involving emotional state: Secondary | ICD-10-CM

## 2020-07-18 DIAGNOSIS — M6389 Disorders of muscle in diseases classified elsewhere, multiple sites: Secondary | ICD-10-CM | POA: Diagnosis not present

## 2020-07-18 DIAGNOSIS — R278 Other lack of coordination: Secondary | ICD-10-CM | POA: Diagnosis not present

## 2020-07-18 DIAGNOSIS — Z96659 Presence of unspecified artificial knee joint: Secondary | ICD-10-CM
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
## 2020-07-19 ENCOUNTER — Non-Acute Institutional Stay (SKILLED_NURSING_FACILITY): Payer: Medicare Other | Admitting: Internal Medicine

## 2020-07-19 DIAGNOSIS — R61 Generalized hyperhidrosis: Secondary | ICD-10-CM | POA: Diagnosis not present

## 2020-07-19 DIAGNOSIS — R2689 Other abnormalities of gait and mobility: Secondary | ICD-10-CM | POA: Diagnosis not present

## 2020-07-19 DIAGNOSIS — M25562 Pain in left knee: Secondary | ICD-10-CM | POA: Diagnosis not present

## 2020-07-19 DIAGNOSIS — M6389 Disorders of muscle in diseases classified elsewhere, multiple sites: Secondary | ICD-10-CM | POA: Diagnosis not present

## 2020-07-19 DIAGNOSIS — Z96652 Presence of left artificial knee joint: Secondary | ICD-10-CM

## 2020-07-19 DIAGNOSIS — M62562 Muscle wasting and atrophy, not elsewhere classified, left lower leg: Secondary | ICD-10-CM | POA: Diagnosis not present

## 2020-07-19 DIAGNOSIS — Z4733 Aftercare following explantation of knee joint prosthesis: Secondary | ICD-10-CM | POA: Diagnosis not present

## 2020-07-19 DIAGNOSIS — L03116 Cellulitis of left lower limb: Secondary | ICD-10-CM | POA: Diagnosis not present

## 2020-07-19 DIAGNOSIS — M1712 Unilateral primary osteoarthritis, left knee: Secondary | ICD-10-CM | POA: Diagnosis not present

## 2020-07-19 DIAGNOSIS — R278 Other lack of coordination: Secondary | ICD-10-CM | POA: Diagnosis not present

## 2020-07-19 NOTE — Progress Notes (Signed)
Location:  [facility] Room Number: 31 Place of Service:  Clinic (12)rehab/snf Provider:  Dorris Vangorder L. Mariea Clonts, D.O., C.M.D.  Gayland Curry, DO  Patient Care Team: Gayland Curry, DO as PCP - General (Geriatric Medicine) Nahser, Wonda Cheng, MD as PCP - Cardiology (Cardiology) Tania Ade, MD as Consulting Physician (Orthopedic Surgery) Nahser, Wonda Cheng, MD as Consulting Physician (Cardiology) Darleen Crocker, MD as Consulting Physician (Ophthalmology) Irine Seal, MD as Attending Physician (Urology) Dorna Leitz, MD as Consulting Physician (Orthopedic Surgery) Tanda Rockers, MD as Consulting Physician (Pulmonary Disease)  Extended Emergency Contact Information Primary Emergency Contact: [name],[name] Address: [address]          [place], [place] [address] Johnnette Litter of [place] Phone: [phone] Mobile Phone: [phone] Relation: Daughter Secondary Emergency Contact: Hipolito Bayley Address: [place], [place] [address] Johnnette Litter of [place] Phone: [phone] Mobile Phone: [phone] Relation: Son  Code Status:  DNR Goals of care: Advanced Directive information Advanced Directives 07/05/2020  Does Patient Have a Medical Advance Directive? Yes  Type of Paramedic of [place];Living will  Does patient want to make changes to medical advance directive? No - Patient declined  Copy of [place] in Chart? No - copy requested  Pre-existing out of facility DNR order (yellow form or pink MOST form) Pink MOST/Yellow Form most recent copy in chart - Physician notified to receive inpatient order     Chief Complaint  Patient presents with  . Acute Visit    Sweats overnight  and left knee erythema and warmth     HPI:  Pt is a 86 y.o. female seen today for an acute visit for episode of sweats overnight.   She is here for rehab after left TKA using CPM.   Nursing thought perhaps this episode of sweats was due to her oxycodone use--she's being weaned off gradually.   She does notably have erythema of her left lateral knee with some mild swelling remaining.  It is still a touch tender.  She is planning to go home soon with home care.  She's been afebrile.    Past Medical History:  Diagnosis Date  . Arthritis   . Cancer ([facility])    basal cell skin cancer  . Concussion   . Dyspnea    upon exertion  . Hypertension   . Hypothyroidism   . Stress incontinence   . Total knee replacement status, bilateral    Past Surgical History:  Procedure Laterality Date  . ABDOMINAL HYSTERECTOMY    . APPENDECTOMY    . CARPAL TUNNEL RELEASE     rt x2  . COLONOSCOPY    . EYE SURGERY Bilateral    cataract surgery with lens implant  . FINGER AMPUTATION Right    little finger  . I & D EXTREMITY  03/11/2012   Procedure: MINOR IRRIGATION AND DEBRIDEMENT EXTREMITY;  Surgeon: Cammie Sickle., MD;  Location: [facility];  Service: Orthopedics;  Laterality: Right;  Right little finger  . KNEE ARTHROSCOPY     bilat   . REPLACEMENT TOTAL KNEE BILATERAL    . REVERSE SHOULDER ARTHROPLASTY Right 09/04/2018   Procedure: REVERSE SHOULDER ARTHROPLASTY;  Surgeon: Tania Ade, MD;  Location: [facility];  Service: Orthopedics;  Laterality: Right;  . TONSILLECTOMY    . TOTAL KNEE REVISION Right 03/14/2015   Procedure: TIBIAL REVISION, OPEN REDUCTION INTERNAL FIXATION PROXIMAL TIBIA;  Surgeon: Dorna Leitz, MD;  Location: [facility];  Service: Orthopedics;  Laterality: Right;  . TOTAL KNEE REVISION Left 07/01/2020   Procedure: TOTAL KNEE REVISION;  Surgeon: Dorna Leitz, MD;  [facility];  Service: Orthopedics;  Laterality: Left;  . TRIGGER FINGER RELEASE    . TUBAL LIGATION      Allergies  Allergen Reactions  . Phenergan [Promethazine Hcl] Other (See Comments)    hallucinations  . Tizanidine     delirium  . Lisinopril Cough    Outpatient Encounter  Medications as of 07/19/2020  Medication Sig  . doxycycline (VIBRA-TABS) 100 MG tablet Take 1 tablet (100 mg total) by mouth 2 (two) times daily.  Marland Kitchen acetaminophen (TYLENOL) 500 MG tablet Take 1,000 mg by mouth every 6 (six) hours as needed for moderate pain.  Marland Kitchen aspirin EC 81 MG tablet Take 81 mg by mouth daily.  . Calcium Carb-Cholecalciferol (CALCIUM 600/VITAMIN D3 PO) Take 1 tablet by mouth daily.  . Cholecalciferol (VITAMIN D-3) 1000 units CAPS Take 1,000 Units by mouth daily.   Marland Kitchen docusate sodium (COLACE) 100 MG capsule Take 100 mg by mouth 2 (two) times daily.  . ferrous sulfate 325 (65 FE) MG tablet Take 325 mg by mouth daily with breakfast.   . folic acid (FOLVITE) 211 MCG tablet Take 800 mcg by mouth daily.   Marland Kitchen levothyroxine (SYNTHROID) 125 MCG tablet Take 1 tablet (125 mcg total) by mouth daily before breakfast.  . losartan (COZAAR) 100 MG tablet Take 0.5 tablets (50 mg total) by mouth daily.  . metoprolol tartrate (LOPRESSOR) 25 MG tablet TAKE 1 TABLET BY MOUTH TWICE A DAY WITH FOOD  . Multiple Vitamins-Minerals (PRESERVISION AREDS 2 PO) Take 1 capsule by mouth 2 (two) times daily.  . [EXPIRED] oxyCODONE (OXY IR/ROXICODONE) 5 MG immediate release tablet Take 1 tablet (5 mg total) by mouth every 4 (four) hours as needed for up to 7 days for severe pain.  . polyethylene glycol (MIRALAX / GLYCOLAX) 17 g packet Take 17 g by mouth daily.  . pravastatin (PRAVACHOL) 80 MG tablet Take 1 tablet (80 mg total) by mouth daily.  . vitamin C (ASCORBIC ACID) 500 MG tablet Take 500 mg by mouth daily.  . [DISCONTINUED] traZODone (DESYREL) 50 MG tablet TAKE 1 TABLET BY MOUTH EVERYDAY AT BEDTIME (Patient taking differently: 75-100 mg at bedtime. Take 75 mg for moderate diff sleeping and 100 mg for severe difficulty sleeping)   No facility-administered encounter medications on file as of 07/19/2020.    Review of Systems  Constitutional: Negative for chills and fever.       Sweats overnight  HENT:  Negative for congestion and sore throat.   Respiratory: Negative for shortness of breath.   Cardiovascular: Negative for chest pain, palpitations and leg swelling.  Gastrointestinal: Negative for abdominal pain.  Genitourinary: Negative for dysuria.  Musculoskeletal: Positive for falls and joint pain.       Had the fall out of bed with CPM machine on her leg  Neurological: Negative for dizziness, loss of consciousness and weakness.  Endo/Heme/Allergies: Bruises/bleeds easily.  Psychiatric/Behavioral: Positive for depression. Negative for memory loss. The patient is not nervous/anxious and does not have insomnia.        Grieving loss of her dog    Immunization History  Administered Date(s) Administered  . Influenza Whole 03/30/2016  . Influenza, High Dose Seasonal PF 04/17/2018, 03/28/2019, 05/20/2020  . Moderna Sars-Covid-2 Vaccination 08/11/2019, 09/08/2019, 06/14/2020  . Pneumococcal Conjugate-13 02/28/2015  . Pneumococcal Polysaccharide-23  07/30/1996  . Td 02/28/2011  . Tdap 02/27/2020  . Zoster 08/30/2006  . Zoster Recombinat (Shingrix) 03/31/2019, 06/12/2019   Pertinent  Health Maintenance Due  Topic Date Due  . PNA vac Low Risk Adult (2 of 2 - PPSV23) 02/28/2016  . INFLUENZA VACCINE  Completed  . DEXA SCAN  Completed   Fall Risk  06/08/2020 06/07/2020 11/17/2019 11/11/2019 08/19/2019  Falls in the past year? 1 1 0 - 0  Number falls in past yr: 0 1 0 0 0  Comment - - - - -  Injury with Fall? 1 1 0 0 0  Comment - patient had laceration on left elbow - - -   Functional Status Survey:    Vitals:   07/20/20 1329  BP: (!) 152/82  Pulse: 79  Temp: (!) 97.3 F (36.3 C)  SpO2: 96%  Weight: 258 lb 12.8 oz (117.4 kg)  Height: 5\' 4"  (1.626 m)   Body mass index is 44.42 kg/m. Physical Exam Vitals reviewed.  Constitutional:      Appearance: Normal appearance. She is obese.  HENT:     Head: Normocephalic and atraumatic.  Cardiovascular:     Rate and Rhythm: Normal rate  and regular rhythm.     Pulses: Normal pulses.     Heart sounds: Normal heart sounds.  Pulmonary:     Effort: Pulmonary effort is normal.     Breath sounds: Normal breath sounds. No wheezing, rhonchi or rales.  Abdominal:     General: Bowel sounds are normal. There is no distension.     Tenderness: There is no abdominal tenderness.  Musculoskeletal:     Right lower leg: No edema.     Left lower leg: No edema.     Comments: Left knee ROM improving gradually  Skin:    Comments: Erythema to lateral aspect of left TKA incision with some swelling present, warmth, tenderness  Neurological:     General: No focal deficit present.     Mental Status: She is alert and oriented to person, place, and time.     Motor: No weakness.     Gait: Gait abnormal.     Comments: Using walker  Psychiatric:        Mood and Affect: Mood normal.        Behavior: Behavior normal.     Labs reviewed: Recent Labs    12/30/19 1127 02/02/20 0000 06/20/20 1143 07/04/20 0000  NA 137 138 137 135*  K 4.4 4.6 5.4* 3.9  CL 104 102 101 99  CO2 32 24* 29 27*  GLUCOSE 116*  --  108*  --   BUN 16 18 18 14   CREATININE 0.88 0.7 0.90 0.7  CALCIUM 9.4 9.5 9.4 8.9   Recent Labs    08/11/19 0500 11/12/19 0300  AST  --  17  ALT  --  14  ALBUMIN 3.9 3.6   Recent Labs    02/02/20 0000 06/20/20 1143 07/02/20 0405  WBC 5.0 6.2 13.4*  HGB 12.7 12.9 12.0  HCT 38 40.1 37.6  MCV  --  90.3 92.2  PLT 256 273 203   Lab Results  Component Value Date   TSH 0.08 (A) 08/11/2019   Lab Results  Component Value Date   HGBA1C 5.5 06/16/2014   Lab Results  Component Value Date   CHOL 189 02/10/2019   HDL 49 08/11/2019   LDLCALC 88 08/11/2019   TRIG 123 08/11/2019   CHOLHDL 4.0 05/21/2017    Assessment/Plan  1. Night sweats -suspect she may have had a fever at that time, but she's been otherwise afebrile -this may also have been due to her opioid therapy  2. Cellulitis of left lower extremity -notable  classic symptoms so will treat this time with doxycycline since this is a recurrence from when ortho treated her with keflex just after her surgery  3. Status post revision of total replacement of left knee -making progress and plans to go home soon with ongoing therapy and home care  Family/ staff Communication: d/w pt and her daughter in room and rehab nurse  Labs/tests ordered:  none  Lamia Mariner L. Rowan Blaker, D.O. [facility] [address], [place] [address] Cell Phone (Mon-Fri 8am-5pm):  [phone] On Call:  [phone] & follow prompts after 5pm & weekends Office Phone:  [phone] Office Fax:  [phone]

## 2020-07-20 ENCOUNTER — Encounter: Payer: Self-pay | Admitting: Internal Medicine

## 2020-07-20 DIAGNOSIS — M62562 Muscle wasting and atrophy, not elsewhere classified, left lower leg: Secondary | ICD-10-CM | POA: Diagnosis not present

## 2020-07-20 DIAGNOSIS — M1712 Unilateral primary osteoarthritis, left knee: Secondary | ICD-10-CM | POA: Diagnosis not present

## 2020-07-20 DIAGNOSIS — M6389 Disorders of muscle in diseases classified elsewhere, multiple sites: Secondary | ICD-10-CM | POA: Diagnosis not present

## 2020-07-20 DIAGNOSIS — R2689 Other abnormalities of gait and mobility: Secondary | ICD-10-CM | POA: Diagnosis not present

## 2020-07-20 DIAGNOSIS — R278 Other lack of coordination: Secondary | ICD-10-CM | POA: Diagnosis not present

## 2020-07-20 DIAGNOSIS — M25562 Pain in left knee: Secondary | ICD-10-CM | POA: Diagnosis not present

## 2020-07-20 DIAGNOSIS — Z4733 Aftercare following explantation of knee joint prosthesis: Secondary | ICD-10-CM | POA: Diagnosis not present

## 2020-07-21 DIAGNOSIS — M62562 Muscle wasting and atrophy, not elsewhere classified, left lower leg: Secondary | ICD-10-CM | POA: Diagnosis not present

## 2020-07-21 DIAGNOSIS — R278 Other lack of coordination: Secondary | ICD-10-CM | POA: Diagnosis not present

## 2020-07-21 DIAGNOSIS — M25562 Pain in left knee: Secondary | ICD-10-CM | POA: Diagnosis not present

## 2020-07-21 DIAGNOSIS — Z4733 Aftercare following explantation of knee joint prosthesis: Secondary | ICD-10-CM | POA: Diagnosis not present

## 2020-07-21 DIAGNOSIS — M6389 Disorders of muscle in diseases classified elsewhere, multiple sites: Secondary | ICD-10-CM | POA: Diagnosis not present

## 2020-07-21 DIAGNOSIS — R2689 Other abnormalities of gait and mobility: Secondary | ICD-10-CM | POA: Diagnosis not present

## 2020-07-21 DIAGNOSIS — M1712 Unilateral primary osteoarthritis, left knee: Secondary | ICD-10-CM | POA: Diagnosis not present

## 2020-07-22 DIAGNOSIS — M6389 Disorders of muscle in diseases classified elsewhere, multiple sites: Secondary | ICD-10-CM | POA: Diagnosis not present

## 2020-07-22 DIAGNOSIS — M62562 Muscle wasting and atrophy, not elsewhere classified, left lower leg: Secondary | ICD-10-CM | POA: Diagnosis not present

## 2020-07-22 DIAGNOSIS — R2689 Other abnormalities of gait and mobility: Secondary | ICD-10-CM | POA: Diagnosis not present

## 2020-07-22 DIAGNOSIS — M1712 Unilateral primary osteoarthritis, left knee: Secondary | ICD-10-CM | POA: Diagnosis not present

## 2020-07-22 DIAGNOSIS — Z4733 Aftercare following explantation of knee joint prosthesis: Secondary | ICD-10-CM | POA: Diagnosis not present

## 2020-07-22 DIAGNOSIS — R278 Other lack of coordination: Secondary | ICD-10-CM | POA: Diagnosis not present

## 2020-07-22 DIAGNOSIS — M25562 Pain in left knee: Secondary | ICD-10-CM | POA: Diagnosis not present

## 2020-07-25 DIAGNOSIS — M62562 Muscle wasting and atrophy, not elsewhere classified, left lower leg: Secondary | ICD-10-CM | POA: Diagnosis not present

## 2020-07-25 DIAGNOSIS — M6389 Disorders of muscle in diseases classified elsewhere, multiple sites: Secondary | ICD-10-CM | POA: Diagnosis not present

## 2020-07-25 DIAGNOSIS — R2689 Other abnormalities of gait and mobility: Secondary | ICD-10-CM | POA: Diagnosis not present

## 2020-07-25 DIAGNOSIS — M25562 Pain in left knee: Secondary | ICD-10-CM | POA: Diagnosis not present

## 2020-07-25 DIAGNOSIS — M1712 Unilateral primary osteoarthritis, left knee: Secondary | ICD-10-CM | POA: Diagnosis not present

## 2020-07-25 DIAGNOSIS — R278 Other lack of coordination: Secondary | ICD-10-CM | POA: Diagnosis not present

## 2020-07-25 DIAGNOSIS — Z4733 Aftercare following explantation of knee joint prosthesis: Secondary | ICD-10-CM | POA: Diagnosis not present

## 2020-07-26 DIAGNOSIS — Z4733 Aftercare following explantation of knee joint prosthesis: Secondary | ICD-10-CM | POA: Diagnosis not present

## 2020-07-26 DIAGNOSIS — M25562 Pain in left knee: Secondary | ICD-10-CM | POA: Diagnosis not present

## 2020-07-26 DIAGNOSIS — R278 Other lack of coordination: Secondary | ICD-10-CM | POA: Diagnosis not present

## 2020-07-26 DIAGNOSIS — M6389 Disorders of muscle in diseases classified elsewhere, multiple sites: Secondary | ICD-10-CM | POA: Diagnosis not present

## 2020-07-26 DIAGNOSIS — M62562 Muscle wasting and atrophy, not elsewhere classified, left lower leg: Secondary | ICD-10-CM | POA: Diagnosis not present

## 2020-07-26 DIAGNOSIS — R2689 Other abnormalities of gait and mobility: Secondary | ICD-10-CM | POA: Diagnosis not present

## 2020-07-26 DIAGNOSIS — M1712 Unilateral primary osteoarthritis, left knee: Secondary | ICD-10-CM | POA: Diagnosis not present

## 2020-07-27 ENCOUNTER — Other Ambulatory Visit: Payer: Self-pay | Admitting: Internal Medicine

## 2020-07-27 DIAGNOSIS — M1712 Unilateral primary osteoarthritis, left knee: Secondary | ICD-10-CM | POA: Diagnosis not present

## 2020-07-27 DIAGNOSIS — M62562 Muscle wasting and atrophy, not elsewhere classified, left lower leg: Secondary | ICD-10-CM | POA: Diagnosis not present

## 2020-07-27 DIAGNOSIS — R2689 Other abnormalities of gait and mobility: Secondary | ICD-10-CM | POA: Diagnosis not present

## 2020-07-27 DIAGNOSIS — F5101 Primary insomnia: Secondary | ICD-10-CM

## 2020-07-27 DIAGNOSIS — Z96652 Presence of left artificial knee joint: Secondary | ICD-10-CM

## 2020-07-27 DIAGNOSIS — M6389 Disorders of muscle in diseases classified elsewhere, multiple sites: Secondary | ICD-10-CM | POA: Diagnosis not present

## 2020-07-27 DIAGNOSIS — R278 Other lack of coordination: Secondary | ICD-10-CM | POA: Diagnosis not present

## 2020-07-27 DIAGNOSIS — M25562 Pain in left knee: Secondary | ICD-10-CM | POA: Diagnosis not present

## 2020-07-27 DIAGNOSIS — Z4733 Aftercare following explantation of knee joint prosthesis: Secondary | ICD-10-CM | POA: Diagnosis not present

## 2020-07-27 MED ORDER — DOXYCYCLINE HYCLATE 100 MG PO TABS: 100.0000 mg | ORAL_TABLET | Freq: Two times a day (BID) | ORAL | 0 refills | Status: DC

## 2020-07-27 MED ORDER — TRAMADOL HCL 50 MG PO TABS: 50.0000 mg | ORAL_TABLET | Freq: Two times a day (BID) | ORAL | 0 refills | Status: DC | PRN

## 2020-07-27 MED ORDER — TRAZODONE HCL 50 MG PO TABS: 75.0000 mg | ORAL_TABLET | Freq: Every day | ORAL | 3 refills | Status: DC

## 2020-07-28 DIAGNOSIS — R278 Other lack of coordination: Secondary | ICD-10-CM | POA: Diagnosis not present

## 2020-07-28 DIAGNOSIS — M62562 Muscle wasting and atrophy, not elsewhere classified, left lower leg: Secondary | ICD-10-CM | POA: Diagnosis not present

## 2020-07-28 DIAGNOSIS — Z4733 Aftercare following explantation of knee joint prosthesis: Secondary | ICD-10-CM | POA: Diagnosis not present

## 2020-07-28 DIAGNOSIS — M25562 Pain in left knee: Secondary | ICD-10-CM | POA: Diagnosis not present

## 2020-07-28 DIAGNOSIS — M1712 Unilateral primary osteoarthritis, left knee: Secondary | ICD-10-CM | POA: Diagnosis not present

## 2020-07-28 DIAGNOSIS — R2689 Other abnormalities of gait and mobility: Secondary | ICD-10-CM | POA: Diagnosis not present

## 2020-07-28 DIAGNOSIS — M6389 Disorders of muscle in diseases classified elsewhere, multiple sites: Secondary | ICD-10-CM | POA: Diagnosis not present

## 2020-07-29 DIAGNOSIS — M6389 Disorders of muscle in diseases classified elsewhere, multiple sites: Secondary | ICD-10-CM | POA: Diagnosis not present

## 2020-07-29 DIAGNOSIS — R278 Other lack of coordination: Secondary | ICD-10-CM | POA: Diagnosis not present

## 2020-07-29 DIAGNOSIS — M25562 Pain in left knee: Secondary | ICD-10-CM | POA: Diagnosis not present

## 2020-07-29 DIAGNOSIS — M1712 Unilateral primary osteoarthritis, left knee: Secondary | ICD-10-CM | POA: Diagnosis not present

## 2020-07-29 DIAGNOSIS — R2689 Other abnormalities of gait and mobility: Secondary | ICD-10-CM | POA: Diagnosis not present

## 2020-07-29 DIAGNOSIS — Z4733 Aftercare following explantation of knee joint prosthesis: Secondary | ICD-10-CM | POA: Diagnosis not present

## 2020-07-29 DIAGNOSIS — M62562 Muscle wasting and atrophy, not elsewhere classified, left lower leg: Secondary | ICD-10-CM | POA: Diagnosis not present

## 2020-08-02 LAB — CBC

## 2020-08-02 LAB — LIPID PANEL
Cholesterol: 153 (ref 0–200)
Cholesterol: 153 (ref 0–200)
HDL: 58 (ref 35–70)
HDL: 58 (ref 35–70)
LDL Cholesterol: 70
LDL Cholesterol: 70
Triglycerides: 127 (ref 40–160)
Triglycerides: 127 (ref 40–160)

## 2020-08-02 LAB — CBC AND DIFFERENTIAL

## 2020-08-03 ENCOUNTER — Non-Acute Institutional Stay: Payer: Medicare Other | Admitting: Internal Medicine

## 2020-08-03 ENCOUNTER — Encounter: Payer: Self-pay | Admitting: Internal Medicine

## 2020-08-03 ENCOUNTER — Other Ambulatory Visit: Payer: Self-pay

## 2020-08-03 VITALS — BP 159/83 | HR 64 | Temp 98.1°F | Ht 64.0 in | Wt 261.0 lb

## 2020-08-03 DIAGNOSIS — Z96652 Presence of left artificial knee joint: Secondary | ICD-10-CM

## 2020-08-03 DIAGNOSIS — Z66 Do not resuscitate: Secondary | ICD-10-CM

## 2020-08-03 DIAGNOSIS — F5101 Primary insomnia: Secondary | ICD-10-CM | POA: Diagnosis not present

## 2020-08-03 DIAGNOSIS — K5901 Slow transit constipation: Secondary | ICD-10-CM

## 2020-08-03 MED ORDER — ESZOPICLONE 1 MG PO TABS: 1.0000 mg | ORAL_TABLET | Freq: Every evening | ORAL | 0 refills | Status: DC | PRN

## 2020-08-03 NOTE — Progress Notes (Signed)
Location:  [facility] of Service:  Clinic (12)  Provider: Nickoli Bagheri L. Mariea Clonts, D.O., C.M.D.  Code Status: DNR Goals of Care:  Advanced Directives 08/03/2020  Does Patient Have a Medical Advance Directive? Yes  Type of Paramedic of [place];Living will  Does patient want to make changes to medical advance directive? No - Patient declined  Copy of [place] in Chart? No - copy requested  Pre-existing out of facility DNR order (yellow form or pink MOST form) Pink MOST/Yellow Form most recent copy in chart - Physician notified to receive inpatient order     Chief Complaint  Patient presents with  . Medical Management of Chronic Issues    6 month  follow up. She would like a script for Lunesta not a generic brand of Lorrin Mais which she was prescribed by another Dr.     HPI: Patient is a 87 y.o. female seen today for medical management of chronic diseases.    Has trouble getting in a comfortable position.  She did cut herself off of her pain pills except tylenol.  Also uses an icepack.  Wants lunesta for a couple weeks.  Trazodone not working for sleep but calms her nerves.  Past Medical History:  Diagnosis Date  . Arthritis   . Cancer ([place])    basal cell skin cancer  . Concussion   . Dyspnea    upon exertion  . Hypertension   . Hypothyroidism   . Stress incontinence   . Total knee replacement status, bilateral     Past Surgical History:  Procedure Laterality Date  . ABDOMINAL HYSTERECTOMY    . APPENDECTOMY    . CARPAL TUNNEL RELEASE     rt x2  . COLONOSCOPY    . EYE SURGERY Bilateral    cataract surgery with lens implant  . FINGER AMPUTATION Right    little finger  . I & D EXTREMITY  03/11/2012   Procedure: MINOR IRRIGATION AND DEBRIDEMENT EXTREMITY;  Surgeon: Cammie Sickle., MD;  Location: [place];  Service: Orthopedics;  Laterality: Right;  Right little finger  . KNEE  ARTHROSCOPY     bilat   . REPLACEMENT TOTAL KNEE BILATERAL    . REVERSE SHOULDER ARTHROPLASTY Right 09/04/2018   Procedure: REVERSE SHOULDER ARTHROPLASTY;  Surgeon: Tania Ade, MD;  Location: [place];  Service: Orthopedics;  Laterality: Right;  . TONSILLECTOMY    . TOTAL KNEE REVISION Right 03/14/2015   Procedure: TIBIAL REVISION, OPEN REDUCTION INTERNAL FIXATION PROXIMAL TIBIA;  Surgeon: Dorna Leitz, MD;  Location: [place];  Service: Orthopedics;  Laterality: Right;  . TOTAL KNEE REVISION Left 07/01/2020   Procedure: TOTAL KNEE REVISION;  Surgeon: Dorna Leitz, MD;  Location: [facility];  Service: Orthopedics;  Laterality: Left;  . TRIGGER FINGER RELEASE    . TUBAL LIGATION      Allergies  Allergen Reactions  . Phenergan [Promethazine Hcl] Other (See Comments)    hallucinations  . Tizanidine     delirium  . Lisinopril Cough    Outpatient Encounter Medications as of 08/03/2020  Medication Sig  . acetaminophen (TYLENOL) 500 MG tablet Take 1,000 mg by mouth every 6 (six) hours as needed for moderate pain.  Marland Kitchen aspirin EC 81 MG tablet Take 81 mg by mouth daily.  . Calcium Carb-Cholecalciferol (CALCIUM 600/VITAMIN D3 PO) Take 1 tablet by mouth daily.  . Cholecalciferol (VITAMIN D-3) 1000 units CAPS Take 1,000 Units by mouth daily.   Marland Kitchen  docusate sodium (COLACE) 100 MG capsule Take 100 mg by mouth 2 (two) times daily.  . ferrous sulfate 325 (65 FE) MG tablet Take 325 mg by mouth daily with breakfast.   . folic acid (FOLVITE) Q000111Q MCG tablet Take 800 mcg by mouth daily.   Marland Kitchen levothyroxine (SYNTHROID) 125 MCG tablet Take 1 tablet (125 mcg total) by mouth daily before breakfast.  . losartan (COZAAR) 100 MG tablet Take 0.5 tablets (50 mg total) by mouth daily.  . metoprolol tartrate (LOPRESSOR) 25 MG tablet TAKE 1 TABLET BY MOUTH TWICE A DAY WITH FOOD  . Multiple Vitamins-Minerals (PRESERVISION AREDS 2 PO) Take 1 capsule by mouth 2 (two) times daily.  . polyethylene glycol (MIRALAX / GLYCOLAX) 17 g  packet Take 17 g by mouth daily.  . pravastatin (PRAVACHOL) 80 MG tablet Take 1 tablet (80 mg total) by mouth daily.  . traZODone (DESYREL) 50 MG tablet Take 1.5 tablets (75 mg total) by mouth at bedtime.  . vitamin C (ASCORBIC ACID) 500 MG tablet Take 500 mg by mouth daily.  . [DISCONTINUED] traMADol (ULTRAM) 50 MG tablet Take 1 tablet (50 mg total) by mouth 2 (two) times daily as needed for up to 7 days.  . [DISCONTINUED] doxycycline (VIBRA-TABS) 100 MG tablet Take 1 tablet (100 mg total) by mouth 2 (two) times daily.   No facility-administered encounter medications on file as of 08/03/2020.    Review of Systems:  Review of Systems  Constitutional: Positive for malaise/fatigue. Negative for chills, diaphoresis and fever.  HENT: Negative for congestion and sore throat.   Eyes: Negative for blurred vision.  Respiratory: Negative for cough and shortness of breath (same as baseline).   Cardiovascular: Negative for chest pain, palpitations and leg swelling.  Gastrointestinal: Negative for abdominal pain, blood in stool, constipation, melena, nausea and vomiting.  Genitourinary: Negative for dysuria.  Musculoskeletal: Positive for joint pain. Negative for falls.  Skin: Negative for itching and rash.       Dark raised spot in left knee incision  Neurological: Negative for dizziness and loss of consciousness.  Endo/Heme/Allergies: Bruises/bleeds easily.  Psychiatric/Behavioral: Positive for memory loss. The patient is nervous/anxious and has insomnia.        Grieving loss of dog    Health Maintenance  Topic Date Due  . PNA vac Low Risk Adult (2 of 2 - PPSV23) 02/28/2016  . TETANUS/TDAP  02/26/2030  . INFLUENZA VACCINE  Completed  . DEXA SCAN  Completed  . COVID-19 Vaccine  Completed    Physical Exam: Vitals:   08/03/20 1430  Pulse: 64  Temp: 98.1 F (36.7 C)  TempSrc: Temporal  SpO2: 94%  Weight: 261 lb (118.4 kg)  Height: 5\' 4"  (1.626 m)   Body mass index is 44.8  kg/m. Physical Exam Vitals reviewed.  Constitutional:      Appearance: Normal appearance. She is obese.  HENT:     Head: Normocephalic and atraumatic.  Cardiovascular:     Rate and Rhythm: Normal rate and regular rhythm.     Pulses: Normal pulses.     Heart sounds: Normal heart sounds.  Pulmonary:     Effort: Pulmonary effort is normal.     Breath sounds: Normal breath sounds. No wheezing, rhonchi or rales.  Abdominal:     General: Bowel sounds are normal.     Palpations: Abdomen is soft.  Musculoskeletal:     Comments: Mild lateral erythema, warmth of knee, see skin below  Skin:    Comments: Small dark  raised area near top of left knee incision, not tender, no drainage or dehiscence (had staples so not a suture)  Neurological:     General: No focal deficit present.     Mental Status: She is alert and oriented to person, place, and time.     Gait: Gait abnormal.     Comments: Using walker  Psychiatric:        Mood and Affect: Mood normal.     Labs reviewed: Basic Metabolic Panel: Recent Labs    08/11/19 0000 08/11/19 0500 12/30/19 1127 02/02/20 0000 06/20/20 1143 07/04/20 0000  NA  --    < > 137 138 137 135*  K  --    < > 4.4 4.6 5.4* 3.9  CL  --    < > 104 102 101 99  CO2  --    < > 32 24* 29 27*  GLUCOSE  --   --  116*  --  108*  --   BUN  --    < > 16 18 18 14   CREATININE  --    < > 0.88 0.7 0.90 0.7  CALCIUM  --    < > 9.4 9.5 9.4 8.9  TSH 0.08*  --   --   --   --   --    < > = values in this interval not displayed.   Liver Function Tests: Recent Labs    08/11/19 0500 11/12/19 0300  AST  --  17  ALT  --  14  ALBUMIN 3.9 3.6   No results for input(s): LIPASE, AMYLASE in the last 8760 hours. No results for input(s): AMMONIA in the last 8760 hours. CBC: Recent Labs    02/02/20 0000 06/20/20 1143 07/02/20 0405  WBC 5.0 6.2 13.4*  HGB 12.7 12.9 12.0  HCT 38 40.1 37.6  MCV  --  90.3 92.2  PLT 256 273 203   Lipid Panel: Recent Labs     08/11/19 0500 08/02/20 0000  CHOL  --  153  HDL 49 58  LDLCALC 88 70  TRIG 123 127   Lab Results  Component Value Date   HGBA1C 5.5 06/16/2014    Assessment/Plan 1. Primary insomnia -is not taking trazodone  -lunesta 1mg  not working -requests lunesta 2mg  qhs--sent in and hope it helps  2. Status post revision of total replacement of left knee -unsure what the area of concern is -ok'd to use abx ointment on it - hopefully just inflammation and will heal normally, not having fever, chills and erythema improved from prior visit when I gave her doxy course for cellulitis -if does not improve, f/u with Dr. 06/18/2014  3. Slow transit constipation -cont daily miralax, colace bid, hydration, moving more, adequate fiber  Labs/tests ordered:  No new Next appt:  11/16/2020  Devun Anna L. Rick Carruthers, D.O. [facility] [address], [facility] [address] Cell Phone (Mon-Fri 8am-5pm):  [phone] On Call:  [phone] & follow prompts after 5pm & weekends Office Phone:  [phone] Office Fax:  [phone]

## 2020-08-10 ENCOUNTER — Other Ambulatory Visit: Payer: Self-pay

## 2020-08-10 ENCOUNTER — Non-Acute Institutional Stay: Payer: Medicare Other | Admitting: Internal Medicine

## 2020-08-10 ENCOUNTER — Encounter: Payer: Self-pay | Admitting: Internal Medicine

## 2020-08-10 VITALS — BP 148/86 | HR 68 | Temp 97.2°F | Ht 64.0 in | Wt 260.0 lb

## 2020-08-10 DIAGNOSIS — F5101 Primary insomnia: Secondary | ICD-10-CM | POA: Diagnosis not present

## 2020-08-10 DIAGNOSIS — Z96652 Presence of left artificial knee joint: Secondary | ICD-10-CM | POA: Diagnosis not present

## 2020-08-10 MED ORDER — ESZOPICLONE 2 MG PO TABS: 2.0000 mg | ORAL_TABLET | Freq: Every evening | ORAL | 0 refills | Status: DC | PRN

## 2020-08-10 NOTE — Progress Notes (Signed)
Location:  [place] of Service:  Clinic (12)  Provider: Adisson Deak L. Mariea Clonts, D.O., C.M.D.  Code Status: DNR Goals of Care:  Advanced Directives 08/03/2020  Does Patient Have a Medical Advance Directive? Yes  Type of Paramedic of [place];Living will  Does patient want to make changes to medical advance directive? No - Patient declined  Copy of [place] in Chart? No - copy requested  Pre-existing out of facility DNR order (yellow form or pink MOST form) Pink MOST/Yellow Form most recent copy in chart - Physician notified to receive inpatient order     Chief Complaint  Patient presents with  . Acute Visit    Complains of Insomnia. Patient stated her Samantha Stone needs to be increased.     HPI: Patient is a 87 y.o. female seen today for an acute visit for insomnia--still taking trazodone, too, and lunesta now at 1mg .  Lunesta worked the first night, but then she could not sleep again after that.  She's started where she took one and then laid there 2-3 hrs and took another.  Gets up in the am and has really not slept hardly at all.  Looks at clock and sees each hour.  May go sit in the recliner.    Has a small area near the upper end of her knee incision that is dark, firm and the rest of it looks ok.  Still a bit warm.     She's getting PT M/W/F at home now.  She wants to get well and keep moving.   Past Medical History:  Diagnosis Date  . Arthritis   . Cancer ([place])    basal cell skin cancer  . Concussion   . Dyspnea    upon exertion  . Hypertension   . Hypothyroidism   . Stress incontinence   . Total knee replacement status, bilateral     Past Surgical History:  Procedure Laterality Date  . ABDOMINAL HYSTERECTOMY    . APPENDECTOMY    . CARPAL TUNNEL RELEASE     rt x2  . COLONOSCOPY    . EYE SURGERY Bilateral    cataract surgery with lens implant  . FINGER AMPUTATION Right    little finger  . I & D EXTREMITY   03/11/2012   Procedure: MINOR IRRIGATION AND DEBRIDEMENT EXTREMITY;  Surgeon: Cammie Sickle., MD;  Location: [place];  Service: Orthopedics;  Laterality: Right;  Right little finger  . KNEE ARTHROSCOPY     bilat   . REPLACEMENT TOTAL KNEE BILATERAL    . REVERSE SHOULDER ARTHROPLASTY Right 09/04/2018   Procedure: REVERSE SHOULDER ARTHROPLASTY;  Surgeon: Tania Ade, MD;  Location: [place];  Service: Orthopedics;  Laterality: Right;  . TONSILLECTOMY    . TOTAL KNEE REVISION Right 03/14/2015   Procedure: TIBIAL REVISION, OPEN REDUCTION INTERNAL FIXATION PROXIMAL TIBIA;  Surgeon: Dorna Leitz, MD;  Location: [place];  Service: Orthopedics;  Laterality: Right;  . TOTAL KNEE REVISION Left 07/01/2020   Procedure: TOTAL KNEE REVISION;  Surgeon: Dorna Leitz, MD;  Location: [facility];  Service: Orthopedics;  Laterality: Left;  . TRIGGER FINGER RELEASE    . TUBAL LIGATION      Allergies  Allergen Reactions  . Phenergan [Promethazine Hcl] Other (See Comments)    hallucinations  . Tizanidine     delirium  . Lisinopril Cough    Outpatient Encounter Medications as of 08/10/2020  Medication Sig  . acetaminophen (TYLENOL) 500  MG tablet Take 1,000 mg by mouth every 6 (six) hours as needed for moderate pain.  Marland Kitchen aspirin EC 81 MG tablet Take 81 mg by mouth daily.  . Calcium Carb-Cholecalciferol (CALCIUM 600/VITAMIN D3 PO) Take 1 tablet by mouth daily.  . Cholecalciferol (VITAMIN D-3) 1000 units CAPS Take 1,000 Units by mouth daily.   Marland Kitchen docusate sodium (COLACE) 100 MG capsule Take 100 mg by mouth 2 (two) times daily.  . eszopiclone (LUNESTA) 1 MG TABS tablet Take 1 tablet (1 mg total) by mouth at bedtime as needed for sleep. Take immediately before bedtime  . ferrous sulfate 325 (65 FE) MG tablet Take 325 mg by mouth daily with breakfast.   . folic acid (FOLVITE) Q000111Q MCG tablet Take 800 mcg by mouth daily.   Marland Kitchen levothyroxine (SYNTHROID) 125 MCG tablet Take 1 tablet (125 mcg total) by mouth  daily before breakfast.  . losartan (COZAAR) 100 MG tablet Take 0.5 tablets (50 mg total) by mouth daily.  . metoprolol tartrate (LOPRESSOR) 25 MG tablet TAKE 1 TABLET BY MOUTH TWICE A DAY WITH FOOD  . Multiple Vitamins-Minerals (PRESERVISION AREDS 2 PO) Take 1 capsule by mouth 2 (two) times daily.  . polyethylene glycol (MIRALAX / GLYCOLAX) 17 g packet Take 17 g by mouth daily.  . pravastatin (PRAVACHOL) 80 MG tablet Take 1 tablet (80 mg total) by mouth daily.  . traZODone (DESYREL) 50 MG tablet Take 1.5 tablets (75 mg total) by mouth at bedtime.  . vitamin C (ASCORBIC ACID) 500 MG tablet Take 500 mg by mouth daily.   No facility-administered encounter medications on file as of 08/10/2020.    Review of Systems:  Review of Systems  Constitutional: Positive for malaise/fatigue. Negative for chills and fever.  Eyes: Negative for blurred vision.  Respiratory: Negative for cough and shortness of breath.   Cardiovascular: Negative for chest pain and leg swelling.  Gastrointestinal: Negative for abdominal pain and constipation.  Genitourinary: Negative for dysuria.  Musculoskeletal: Positive for joint pain. Negative for falls and myalgias.  Skin: Negative for itching and rash.       Dark spot along incision  Neurological: Negative for dizziness and loss of consciousness.  Psychiatric/Behavioral: Positive for memory loss. The patient has insomnia.        A little bit of cognitive change over the past year notable    Health Maintenance  Topic Date Due  . PNA vac Low Risk Adult (2 of 2 - PPSV23) 02/28/2016  . TETANUS/TDAP  02/26/2030  . INFLUENZA VACCINE  Completed  . DEXA SCAN  Completed  . COVID-19 Vaccine  Completed    Physical Exam: Vitals:   08/10/20 1433  BP: (!) 148/86  Pulse: 68  Temp: (!) 97.2 F (36.2 C)  TempSrc: Skin  SpO2: 98%  Weight: 260 lb (117.9 kg)  Height: 5\' 4"  (1.626 m)   Body mass index is 44.63 kg/m. Physical Exam Vitals reviewed.  Constitutional:       Appearance: Normal appearance. She is obese.  HENT:     Head: Normocephalic and atraumatic.  Eyes:     Extraocular Movements: Extraocular movements intact.     Pupils: Pupils are equal, round, and reactive to light.  Cardiovascular:     Rate and Rhythm: Normal rate and regular rhythm.     Pulses: Normal pulses.     Heart sounds: Normal heart sounds.  Pulmonary:     Effort: Pulmonary effort is normal.     Breath sounds: Normal breath sounds. No  wheezing, rhonchi or rales.  Abdominal:     General: Bowel sounds are normal. There is no distension.     Tenderness: There is no abdominal tenderness.  Musculoskeletal:     Right lower leg: No edema.     Left lower leg: No edema.     Comments: Ambulates with rolling walker with skis  Skin:    Comments: Left knee revision incision with dark, firm area closer to proximal end, nontender there to touch, no drainage or dehiscence; mild warmth of entire knee remains and she reports deep pain  Neurological:     General: No focal deficit present.     Mental Status: She is alert and oriented to person, place, and time.     Motor: No weakness.     Gait: Gait abnormal.     Comments: Using walker   Psychiatric:        Mood and Affect: Mood normal.     Labs reviewed: Basic Metabolic Panel: Recent Labs    12/30/19 1127 02/02/20 0000 06/20/20 1143 07/04/20 0000  NA 137 138 137 135*  K 4.4 4.6 5.4* 3.9  CL 104 102 101 99  CO2 32 24* 29 27*  GLUCOSE 116*  --  108*  --   BUN 16 18 18 14   CREATININE 0.88 0.7 0.90 0.7  CALCIUM 9.4 9.5 9.4 8.9   Liver Function Tests: Recent Labs    11/12/19 0300  AST 17  ALT 14  ALBUMIN 3.6   No results for input(s): LIPASE, AMYLASE in the last 8760 hours. No results for input(s): AMMONIA in the last 8760 hours. CBC: Recent Labs    02/02/20 0000 06/20/20 1143 07/02/20 0405  WBC 5.0 6.2 13.4*  HGB 12.7 12.9 12.0  HCT 38 40.1 37.6  MCV  --  90.3 92.2  PLT 256 273 203   Lipid Panel: Recent  Labs    08/02/20 0000  CHOL 153  153  HDL 58  58  LDLCALC 70  70  TRIG 127  127   Lab Results  Component Value Date   HGBA1C 5.5 06/16/2014    Procedures since last visit: No results found.  Assessment/Plan 1. Primary insomnia - not resting with 1mg  lunesta -not using trazodone (though did try in combo with lunesta w/o success) - she has 5 of the 1mg  left so going to try two of them until used and then get new Rx if successful -aware of risks in seniors of sedative/hypnotics -eszopiclone (LUNESTA) 2 MG TABS tablet; Take 1 tablet (2 mg total) by mouth at bedtime as needed for sleep. Take immediately before bedtime  Dispense: 30 tablet; Refill: 0  2. Status post revision of total replacement of left knee -firm area in inicision--she's going to use antibiotic ointment on it to see if it improves--if not, notify Dr. Berenice Primas  Labs/tests ordered:  No new Next appt:  11/16/2020  Luca Burston L. Lafe Clerk, D.O. [facility] [address], [place] [address] Cell Phone (Mon-Fri 8am-5pm):  [phone] On Call:  [phone] & follow prompts after 5pm & weekends Office Phone:  [phone] Office Fax:  [phone]

## 2020-09-19 ENCOUNTER — Encounter: Payer: Self-pay | Admitting: Internal Medicine

## 2020-09-27 ENCOUNTER — Other Ambulatory Visit: Payer: Self-pay | Admitting: Internal Medicine

## 2020-10-10 DIAGNOSIS — M25562 Pain in left knee: Secondary | ICD-10-CM | POA: Diagnosis not present

## 2020-10-22 ENCOUNTER — Other Ambulatory Visit: Payer: Self-pay | Admitting: Internal Medicine

## 2020-10-22 DIAGNOSIS — E7800 Pure hypercholesterolemia, unspecified: Secondary | ICD-10-CM

## 2020-11-16 ENCOUNTER — Encounter: Payer: Self-pay | Admitting: Internal Medicine

## 2020-11-23 ENCOUNTER — Encounter: Payer: Self-pay | Admitting: Internal Medicine

## 2020-11-30 ENCOUNTER — Encounter: Payer: Self-pay | Admitting: Adult Health

## 2020-12-05 ENCOUNTER — Other Ambulatory Visit: Payer: Self-pay

## 2020-12-05 ENCOUNTER — Encounter: Payer: Self-pay | Admitting: Internal Medicine

## 2020-12-05 ENCOUNTER — Non-Acute Institutional Stay: Payer: Medicare Other | Admitting: Internal Medicine

## 2020-12-05 VITALS — BP 156/92 | HR 62 | Temp 96.4°F | Ht 64.0 in | Wt 267.0 lb

## 2020-12-05 DIAGNOSIS — F5101 Primary insomnia: Secondary | ICD-10-CM | POA: Diagnosis not present

## 2020-12-05 DIAGNOSIS — E039 Hypothyroidism, unspecified: Secondary | ICD-10-CM

## 2020-12-05 DIAGNOSIS — E7800 Pure hypercholesterolemia, unspecified: Secondary | ICD-10-CM | POA: Diagnosis not present

## 2020-12-05 DIAGNOSIS — Z96652 Presence of left artificial knee joint: Secondary | ICD-10-CM | POA: Diagnosis not present

## 2020-12-05 DIAGNOSIS — I10 Essential (primary) hypertension: Secondary | ICD-10-CM

## 2020-12-05 NOTE — Progress Notes (Signed)
Location:  [place] of Service:  Clinic (12)  Provider:   Code Status:  Goals of Care:  Advanced Directives 08/03/2020  Does Patient Have a Medical Advance Directive? Yes  Type of Paramedic of [place];Living will  Does patient want to make changes to medical advance directive? No - Patient declined  Copy of [place] in Chart? No - copy requested  Pre-existing out of facility DNR order (yellow form or pink MOST form) Pink MOST/Yellow Form most recent copy in chart - Physician notified to receive inpatient order     Chief Complaint  Patient presents with  . Medical Management of Chronic Issues    Patient returns to the clinic for her 3 month follow up, and meet Dr. Lyndel Safe. Patient would like to discuss her left foot swelling and right foot pain. She seen an eye doctor last week and will soon see a dermatologist.     HPI: Patient is a 87 y.o. female seen today for medical management of chronic diseases.    Patient has a history of left total knee revision by Dr. Berenice Primas on 07/01/2020 History of chronic diastolic CHF, hyperlipidemia, and hypothyroidism  She came for follow-up today. She has noticed that her left leg including the knee is more swollen than the right one.  She is not having any pain no redness.  She is walking.  Does her exercises every other day. She also noticed slight swelling around her right ankle. Denies any shortness of breath or cough. Pain seems to be controlled. Using walker to walk.  No recent falls or injuries. Has gained more weight since her last visit. Patient is a retired Furniture conservator/restorer   Past Medical History:  Diagnosis Date  . Arthritis   . Cancer ([place])    basal cell skin cancer  . Concussion   . Dyspnea    upon exertion  . Hypertension   . Hypothyroidism   . Stress incontinence   . Total knee replacement status, bilateral     Past Surgical History:  Procedure  Laterality Date  . ABDOMINAL HYSTERECTOMY    . APPENDECTOMY    . CARPAL TUNNEL RELEASE     rt x2  . COLONOSCOPY    . EYE SURGERY Bilateral    cataract surgery with lens implant  . FINGER AMPUTATION Right    little finger  . I & D EXTREMITY  03/11/2012   Procedure: MINOR IRRIGATION AND DEBRIDEMENT EXTREMITY;  Surgeon: Cammie Sickle., MD;  Location: [place];  Service: Orthopedics;  Laterality: Right;  Right little finger  . KNEE ARTHROSCOPY     bilat   . REPLACEMENT TOTAL KNEE BILATERAL    . REVERSE SHOULDER ARTHROPLASTY Right 09/04/2018   Procedure: REVERSE SHOULDER ARTHROPLASTY;  Surgeon: Tania Ade, MD;  Location: [place] [facility];  Service: Orthopedics;  Laterality: Right;  . TONSILLECTOMY    . TOTAL KNEE REVISION Right 03/14/2015   Procedure: TIBIAL REVISION, OPEN REDUCTION INTERNAL FIXATION PROXIMAL TIBIA;  Surgeon: Dorna Leitz, MD;  Location: [place];  Service: Orthopedics;  Laterality: Right;  . TOTAL KNEE REVISION Left 07/01/2020   Procedure: TOTAL KNEE REVISION;  Surgeon: Dorna Leitz, MD;  [facility];  Service: Orthopedics;  Laterality: Left;  . TRIGGER FINGER RELEASE    . TUBAL LIGATION      Allergies  Allergen Reactions  . Phenergan [Promethazine Hcl] Other (See Comments)    hallucinations  . Tizanidine  delirium  . Lisinopril Cough    Outpatient Encounter Medications as of 12/05/2020  Medication Sig  . acetaminophen (TYLENOL) 500 MG tablet Take 1,000 mg by mouth every 6 (six) hours as needed for moderate pain.  Marland Kitchen aspirin EC 81 MG tablet Take 81 mg by mouth daily.  . Calcium Carb-Cholecalciferol (CALCIUM 600/VITAMIN D3 PO) Take 1 tablet by mouth daily.  . Cholecalciferol (VITAMIN D-3) 1000 units CAPS Take 1,000 Units by mouth daily.   Marland Kitchen docusate sodium (COLACE) 100 MG capsule Take 100 mg by mouth daily.  . ferrous sulfate 325 (65 FE) MG tablet Take 325 mg by mouth daily with breakfast.   . folic acid (FOLVITE) 031 MCG tablet Take 800 mcg by  mouth daily.   Marland Kitchen levothyroxine (SYNTHROID) 125 MCG tablet Take 1 tablet (125 mcg total) by mouth daily before breakfast.  . losartan (COZAAR) 100 MG tablet Take 0.5 tablets (50 mg total) by mouth daily.  . metoprolol tartrate (LOPRESSOR) 25 MG tablet TAKE 1 TABLET BY MOUTH TWICE A DAY WITH FOOD  . Multiple Vitamins-Minerals (PRESERVISION AREDS 2 PO) Take 1 capsule by mouth 2 (two) times daily.  . pravastatin (PRAVACHOL) 80 MG tablet TAKE 1 TABLET BY MOUTH EVERY DAY  . vitamin C (ASCORBIC ACID) 500 MG tablet Take 500 mg by mouth daily.  . [DISCONTINUED] eszopiclone (LUNESTA) 2 MG TABS tablet Take 1 tablet (2 mg total) by mouth at bedtime as needed for sleep. Take immediately before bedtime  . [DISCONTINUED] polyethylene glycol (MIRALAX / GLYCOLAX) 17 g packet Take 17 g by mouth daily.   No facility-administered encounter medications on file as of 12/05/2020.    Review of Systems:  Review of Systems  Constitutional: Positive for activity change.  HENT: Negative.   Respiratory: Negative.   Cardiovascular: Positive for leg swelling.  Gastrointestinal: Negative.   Genitourinary: Negative.   Musculoskeletal: Positive for arthralgias and gait problem.  Skin: Negative.   Neurological: Negative for dizziness.  Psychiatric/Behavioral: Negative.     Health Maintenance  Topic Date Due  . PNA vac Low Risk Adult (2 of 2 - PPSV23) 02/28/2016  . INFLUENZA VACCINE  02/27/2021  . TETANUS/TDAP  02/26/2030  . DEXA SCAN  Completed  . COVID-19 Vaccine  Completed  . HPV VACCINES  Aged Out    Physical Exam: Vitals:   12/05/20 1023  BP: (!) 156/92  Pulse: 62  Temp: (!) 96.4 F (35.8 C)  SpO2: 98%  Weight: 267 lb (121.1 kg)  Height: 5\' 4"  (1.626 m)   Body mass index is 45.83 kg/m. Physical Exam Vitals reviewed.  Constitutional:      Appearance: Normal appearance. She is obese.  HENT:     Head: Normocephalic.     Nose: Nose normal.     Mouth/Throat:     Mouth: Mucous membranes are moist.      Pharynx: Oropharynx is clear.  Eyes:     Pupils: Pupils are equal, round, and reactive to light.  Cardiovascular:     Rate and Rhythm: Normal rate and regular rhythm.     Pulses: Normal pulses.  Pulmonary:     Effort: Pulmonary effort is normal.     Breath sounds: Normal breath sounds.  Abdominal:     General: Abdomen is flat. Bowel sounds are normal.     Palpations: Abdomen is soft.  Musculoskeletal:     Cervical back: Neck supple.     Comments: Bilateral Edema Left More then Right with Chronic Venous Changes No Redness or  Pain or Erythema  Skin:    General: Skin is warm.  Neurological:     General: No focal deficit present.     Mental Status: She is alert and oriented to person, place, and time.     Comments: Good And Stable.Gait with her walker  Psychiatric:        Mood and Affect: Mood normal.        Thought Content: Thought content normal.     Labs reviewed: Basic Metabolic Panel: Recent Labs    12/30/19 1127 02/02/20 0000 06/20/20 1143 07/04/20 0000  NA 137 138 137 135*  K 4.4 4.6 5.4* 3.9  CL 104 102 101 99  CO2 32 24* 29 27*  GLUCOSE 116*  --  108*  --   BUN 16 18 18 14   CREATININE 0.88 0.7 0.90 0.7  CALCIUM 9.4 9.5 9.4 8.9   Liver Function Tests: No results for input(s): AST, ALT, ALKPHOS, BILITOT, PROT, ALBUMIN in the last 8760 hours. No results for input(s): LIPASE, AMYLASE in the last 8760 hours. No results for input(s): AMMONIA in the last 8760 hours. CBC: Recent Labs    02/02/20 0000 06/20/20 1143 07/02/20 0405  WBC 5.0 6.2 13.4*  HGB 12.7 12.9 12.0  HCT 38 40.1 37.6  MCV  --  90.3 92.2  PLT 256 273 203   Lipid Panel: Recent Labs    08/02/20 0000  CHOL 153  153  HDL 58  58  LDLCALC 70  70  TRIG 127  127   Lab Results  Component Value Date   HGBA1C 5.5 06/16/2014    Procedures since last visit: No results found.  Assessment/Plan 1. Status post revision of total replacement of left knee and now Has Some leg swelling   Discussed Doppler with Patient She has Appointment with Ortho and wants to wait for their Opinion  Edema Can consider Lasix PRn  2. Primary insomnia On  Trazodone  no [place]  3. Pure hypercholesterolemia On Prevachol Repeat Lipid Panel  4. Hypothyroidism, unspecified type Follow TSH  5. Essential hypertension Mildily Elevated today On Cozaar and Lopressor 6 On Iron as she Likes to donate Blood   Labs/tests ordered: CBC,CMP,LIpid Panel,TSh Next appt:  04/10/2021

## 2021-01-13 ENCOUNTER — Other Ambulatory Visit (HOSPITAL_BASED_OUTPATIENT_CLINIC_OR_DEPARTMENT_OTHER): Payer: Self-pay

## 2021-01-13 ENCOUNTER — Ambulatory Visit: Payer: Medicare Other | Attending: Internal Medicine

## 2021-01-13 ENCOUNTER — Other Ambulatory Visit: Payer: Self-pay

## 2021-01-13 DIAGNOSIS — Z23 Encounter for immunization: Secondary | ICD-10-CM

## 2021-01-13 MED ORDER — COVID-19 MRNA VACC (MODERNA) 100 MCG/0.5ML IM SUSP
INTRAMUSCULAR | 0 refills | Status: DC
  Filled 2021-01-13: qty 0.3, 1d supply, fill #0

## 2021-01-13 NOTE — Progress Notes (Signed)
   Covid-19 Vaccination Clinic  Name:  KRYSTIN KEEVEN    MRN: [id number] DOB: [birth year]  01/13/2021  Ms. Quain was observed post Covid-19 immunization for 15 minutes without incident. She was provided with Vaccine Information Sheet and instruction to access the V-Safe system.   Ms. Zook was instructed to call 911 with any severe reactions post vaccine: Difficulty breathing  Swelling of face and throat  A fast heartbeat  A bad rash all over body  Dizziness and weakness   Immunizations Administered     Name Date Dose VIS Date Route   Moderna Covid-19 Booster Vaccine 01/13/2021  1:32 PM 0.25 mL 05/18/2020 Intramuscular   Manufacturer: Moderna   Lot: [id number]   [place]: [id number]

## 2021-01-23 ENCOUNTER — Other Ambulatory Visit: Payer: Self-pay | Admitting: *Deleted

## 2021-01-23 DIAGNOSIS — I10 Essential (primary) hypertension: Secondary | ICD-10-CM

## 2021-01-23 MED ORDER — LOSARTAN POTASSIUM 100 MG PO TABS: 50.0000 mg | ORAL_TABLET | Freq: Every day | ORAL | 1 refills | Status: DC

## 2021-01-23 MED ORDER — METOPROLOL TARTRATE 25 MG PO TABS
25.0000 mg | ORAL_TABLET | Freq: Two times a day (BID) | ORAL | 1 refills | Status: DC
Start: 2021-01-23 — End: 2021-11-08

## 2021-01-23 NOTE — Addendum Note (Signed)
Addended by: Rafael Bihari A on: 01/23/2021 04:13 PM   Modules accepted: Orders

## 2021-01-23 NOTE — Telephone Encounter (Signed)
Pharmacy requested refill

## 2021-04-06 DIAGNOSIS — I10 Essential (primary) hypertension: Secondary | ICD-10-CM | POA: Diagnosis not present

## 2021-04-06 DIAGNOSIS — E785 Hyperlipidemia, unspecified: Secondary | ICD-10-CM | POA: Diagnosis not present

## 2021-04-06 LAB — CBC: RBC: 4.39 (ref 3.87–5.11)

## 2021-04-06 LAB — HEPATIC FUNCTION PANEL
ALT: 15 (ref 7–35)
AST: 24 (ref 13–35)
Alkaline Phosphatase: 78 (ref 25–125)
Bilirubin, Total: 0.6

## 2021-04-06 LAB — LIPID PANEL
Cholesterol: 155 (ref 0–200)
HDL: 57 (ref 35–70)
LDL Cholesterol: 80
LDl/HDL Ratio: 2.7
Triglycerides: 93 (ref 40–160)

## 2021-04-06 LAB — BASIC METABOLIC PANEL
BUN: 16 (ref 4–21)
CO2: 28 — AB (ref 13–22)
Chloride: 101 (ref 99–108)
Creatinine: 0.9 (ref 0.5–1.1)
Glucose: 117
Potassium: 4.5 (ref 3.4–5.3)
Sodium: 139 (ref 137–147)

## 2021-04-06 LAB — CBC AND DIFFERENTIAL
HCT: 38 (ref 36–46)
Hemoglobin: 12.9 (ref 12.0–16.0)
Platelets: 215 (ref 150–399)
WBC: 4.4

## 2021-04-06 LAB — TSH: TSH: 0.46 (ref 0.41–5.90)

## 2021-04-06 LAB — COMPREHENSIVE METABOLIC PANEL
Albumin: 4.1 (ref 3.5–5.0)
Calcium: 9.6 (ref 8.7–10.7)
Globulin: 2.5

## 2021-04-10 ENCOUNTER — Encounter: Payer: Medicare Other | Admitting: Adult Health

## 2021-04-10 ENCOUNTER — Other Ambulatory Visit: Payer: Self-pay

## 2021-04-10 ENCOUNTER — Encounter: Payer: Self-pay | Admitting: Adult Health

## 2021-04-10 DIAGNOSIS — I700 Atherosclerosis of aorta: Secondary | ICD-10-CM | POA: Insufficient documentation

## 2021-04-10 NOTE — Progress Notes (Deleted)
Location: [facility]  POS: clinic  Provider:  Cindi Carbon, [place] [phone]   Code Status: DNR Goals of Care:  Advanced Directives 08/03/2020  Does Patient Have a Medical Advance Directive? Yes  Type of Paramedic of [place];Living will  Does patient want to make changes to medical advance directive? No - Patient declined  Copy of [place] in Chart? No - copy requested  Pre-existing out of facility DNR order (yellow form or pink MOST form) Pink MOST/Yellow Form most recent copy in chart - Physician notified to receive inpatient order     No chief complaint on file.   HPI: Patient is a 87 y.o. female seen today for medical management of chronic diseases.   [place] significant for OA,  BCC, HTN, hypothyroid, stress incontinence, TKA, insomnia, HLD, takes iron due to frequent blood transfusion, diastolic CHF, transient global amnesia.   Past Medical History:  Diagnosis Date   Arthritis    Cancer ([place])    basal cell skin cancer   Concussion    Dyspnea    upon exertion   Hypertension    Hypothyroidism    Stress incontinence    Total knee replacement status, bilateral     Past Surgical History:  Procedure Laterality Date   ABDOMINAL HYSTERECTOMY     APPENDECTOMY     CARPAL TUNNEL RELEASE     rt x2   COLONOSCOPY     EYE SURGERY Bilateral    cataract surgery with lens implant   FINGER AMPUTATION Right    little finger   I & D EXTREMITY  03/11/2012   Procedure: MINOR IRRIGATION AND DEBRIDEMENT EXTREMITY;  Surgeon: Cammie Sickle., MD;  Location: [place];  Service: Orthopedics;  Laterality: Right;  Right little finger   KNEE ARTHROSCOPY     bilat    REPLACEMENT TOTAL KNEE BILATERAL     REVERSE SHOULDER ARTHROPLASTY Right 09/04/2018   Procedure: REVERSE SHOULDER ARTHROPLASTY;  Surgeon: Tania Ade, MD;  Location: [place];  Service: Orthopedics;  Laterality: Right;   TONSILLECTOMY      TOTAL KNEE REVISION Right 03/14/2015   Procedure: TIBIAL REVISION, OPEN REDUCTION INTERNAL FIXATION PROXIMAL TIBIA;  Surgeon: Dorna Leitz, MD;  Location: [facility];  Service: Orthopedics;  Laterality: Right;   TOTAL KNEE REVISION Left 07/01/2020   Procedure: TOTAL KNEE REVISION;  Surgeon: Dorna Leitz, MD;  [facility];  Service: Orthopedics;  Laterality: Left;   TRIGGER FINGER RELEASE     TUBAL LIGATION      Allergies  Allergen Reactions   Phenergan [Promethazine Hcl] Other (See Comments)    hallucinations   Tizanidine     delirium   Lisinopril Cough    Outpatient Encounter Medications as of 04/10/2021  Medication Sig   acetaminophen (TYLENOL) 500 MG tablet Take 1,000 mg by mouth every 6 (six) hours as needed for moderate pain.   aspirin EC 81 MG tablet Take 81 mg by mouth daily.   Calcium Carb-Cholecalciferol (CALCIUM 600/VITAMIN D3 PO) Take 1 tablet by mouth daily.   Cholecalciferol (VITAMIN D-3) 1000 units CAPS Take 1,000 Units by mouth daily.    COVID-19 mRNA vaccine, Moderna, 100 MCG/0.5ML injection Inject into the muscle.   docusate sodium (COLACE) 100 MG capsule Take 100 mg by mouth daily.   ferrous sulfate 325 (65 FE) MG tablet Take 325 mg by mouth daily with breakfast.    folic acid (FOLVITE) Q000111Q MCG tablet Take 800 mcg  by mouth daily.    levothyroxine (SYNTHROID) 125 MCG tablet Take 1 tablet (125 mcg total) by mouth daily before breakfast.   losartan (COZAAR) 100 MG tablet Take 0.5 tablets (50 mg total) by mouth daily.   metoprolol tartrate (LOPRESSOR) 25 MG tablet Take 1 tablet (25 mg total) by mouth 2 (two) times daily with a meal.   Multiple Vitamins-Minerals (PRESERVISION AREDS 2 PO) Take 1 capsule by mouth 2 (two) times daily.   pravastatin (PRAVACHOL) 80 MG tablet TAKE 1 TABLET BY MOUTH EVERY DAY   vitamin C (ASCORBIC ACID) 500 MG tablet Take 500 mg by mouth daily.   No facility-administered encounter medications on file as of 04/10/2021.    Review of Systems:   Review of Systems  Constitutional:  Negative for activity change, appetite change, chills, diaphoresis, fatigue, fever and unexpected weight change.  HENT:  Negative for congestion.   Respiratory:  Negative for cough, shortness of breath and wheezing.   Cardiovascular:  Negative for chest pain, palpitations and leg swelling.  Gastrointestinal:  Negative for abdominal distention, abdominal pain, constipation and diarrhea.  Genitourinary:  Negative for difficulty urinating and dysuria.  Musculoskeletal:  Negative for arthralgias, back pain, gait problem, joint swelling and myalgias.  Neurological:  Negative for dizziness, tremors, seizures, syncope, facial asymmetry, speech difficulty, weakness, light-headedness, numbness and headaches.  Psychiatric/Behavioral:  Negative for agitation, behavioral problems and confusion.    Health Maintenance  Topic Date Due   INFLUENZA VACCINE  02/27/2021   TETANUS/TDAP  02/26/2030   DEXA SCAN  Completed   COVID-19 Vaccine  Completed   Zoster Vaccines- Shingrix  Completed   HPV VACCINES  Aged Out   PNA vac Low Risk Adult  Discontinued    Physical Exam: There were no vitals filed for this visit. There is no height or weight on file to calculate BMI. Physical Exam Vitals reviewed.  Constitutional:      General: She is not in acute distress.    Appearance: She is not diaphoretic.  HENT:     Head: Normocephalic and atraumatic.  Neck:     Vascular: No JVD.  Cardiovascular:     Rate and Rhythm: Normal rate and regular rhythm.     Heart sounds: No murmur heard. Pulmonary:     Effort: Pulmonary effort is normal. No respiratory distress.     Breath sounds: Normal breath sounds. No wheezing.  Abdominal:     General: Bowel sounds are normal. There is no distension.     Palpations: Abdomen is soft.     Tenderness: There is no abdominal tenderness.  Skin:    General: Skin is warm and dry.  Neurological:     Mental Status: She is alert and oriented  to person, place, and time.  Psychiatric:        Mood and Affect: Mood normal.    Labs reviewed: Basic Metabolic Panel: Recent Labs    06/20/20 1143 07/04/20 0000  NA 137 135*  K 5.4* 3.9  CL 101 99  CO2 29 27*  GLUCOSE 108*  --   BUN 18 14  CREATININE 0.90 0.7  CALCIUM 9.4 8.9   Liver Function Tests: No results for input(s): AST, ALT, ALKPHOS, BILITOT, PROT, ALBUMIN in the last 8760 hours. No results for input(s): LIPASE, AMYLASE in the last 8760 hours. No results for input(s): AMMONIA in the last 8760 hours. CBC: Recent Labs    06/20/20 1143 07/02/20 0405  WBC 6.2 13.4*  HGB 12.9 12.0  HCT 40.1  37.6  MCV 90.3 92.2  PLT 273 203   Lipid Panel: Recent Labs    08/02/20 0000  CHOL 153  153  HDL 58  58  LDLCALC 70  70  TRIG 127  127   Lab Results  Component Value Date   HGBA1C 5.5 06/16/2014    Procedures since last visit: No results found.  Assessment/Plan  1. Aortic atherosclerosis ([place]) Found on CT of the abd 11/2019  2. Hypothyroidism, unspecified type ***  3. Pure hypercholesterolemia ***  4. Continuous leakage of urine ***  5. Essential hypertension ***  6. Chronic diastolic congestive heart failure (HCC) ***   Labs/tests ordered:  * No order type specified * Next appt:  Visit date not found   Total time **:  time greater than 50% of total time spent doing pt counseling and coordination of care

## 2021-04-10 NOTE — Progress Notes (Signed)
Location: [facility]  POS: clinic  Provider:  Cindi Carbon, [place] [phone]   Code Status: DNR Goals of Care:  Advanced Directives 04/10/2021  Does Patient Have a Medical Advance Directive? Yes  Type of Advance Directive Out of facility DNR (pink MOST or yellow form);Living will  Does patient want to make changes to medical advance directive? No - Patient declined  Copy of [place] in Chart? -  Pre-existing out of facility DNR order (yellow form or pink MOST form) Yellow form placed in chart (order not valid for inpatient use)     Chief Complaint  Patient presents with  . Medical Management of Chronic Issues    Annual Physical  . Quality Metric Gaps    Flu shot  . Error     HPI: Patient is a 87 y.o. female seen today for medical management of chronic diseases.   [place] significant for OA,  BCC, HTN, hypothyroid, stress incontinence, TKA, insomnia, HLD, takes iron due to frequent blood transfusion, diastolic CHF, transient global amnesia.   Past Medical History:  Diagnosis Date  . Arthritis   . Cancer ([place])    basal cell skin cancer  . Concussion   . Dyspnea    upon exertion  . Hypertension   . Hypothyroidism   . Stress incontinence   . Total knee replacement status, bilateral     Past Surgical History:  Procedure Laterality Date  . ABDOMINAL HYSTERECTOMY    . APPENDECTOMY    . CARPAL TUNNEL RELEASE     rt x2  . COLONOSCOPY    . EYE SURGERY Bilateral    cataract surgery with lens implant  . FINGER AMPUTATION Right    little finger  . I & D EXTREMITY  03/11/2012   Procedure: MINOR IRRIGATION AND DEBRIDEMENT EXTREMITY;  Surgeon: Cammie Sickle., MD;  Location: [place];  Service: Orthopedics;  Laterality: Right;  Right little finger  . KNEE ARTHROSCOPY     bilat   . REPLACEMENT TOTAL KNEE BILATERAL    . REVERSE SHOULDER ARTHROPLASTY Right 09/04/2018   Procedure: REVERSE SHOULDER ARTHROPLASTY;   Surgeon: Tania Ade, MD;  Location: [place];  Service: Orthopedics;  Laterality: Right;  . TONSILLECTOMY    . TOTAL KNEE REVISION Right 03/14/2015   Procedure: TIBIAL REVISION, OPEN REDUCTION INTERNAL FIXATION PROXIMAL TIBIA;  Surgeon: Dorna Leitz, MD;  Location: [place];  Service: Orthopedics;  Laterality: Right;  . TOTAL KNEE REVISION Left 07/01/2020   Procedure: TOTAL KNEE REVISION;  Surgeon: Dorna Leitz, MD;  [facility];  Service: Orthopedics;  Laterality: Left;  . TRIGGER FINGER RELEASE    . TUBAL LIGATION      Allergies  Allergen Reactions  . Phenergan [Promethazine Hcl] Other (See Comments)    hallucinations  . Tizanidine     delirium  . Lisinopril Cough    Outpatient Encounter Medications as of 04/10/2021  Medication Sig  . acetaminophen (TYLENOL) 500 MG tablet Take 1,000 mg by mouth every 6 (six) hours as needed for moderate pain.  Marland Kitchen aspirin EC 81 MG tablet Take 81 mg by mouth daily.  . Calcium Carb-Cholecalciferol (CALCIUM 600/VITAMIN D3 PO) Take 1 tablet by mouth daily.  . Cholecalciferol (VITAMIN D-3) 1000 units CAPS Take 1,000 Units by mouth daily.   Marland Kitchen COVID-19 mRNA vaccine, Moderna, 100 MCG/0.5ML injection Inject into the muscle.  . docusate sodium (COLACE) 100 MG capsule Take 100 mg by mouth daily.  . ferrous  sulfate 325 (65 FE) MG tablet Take 325 mg by mouth daily with breakfast.   . folic acid (FOLVITE) Q000111Q MCG tablet Take 800 mcg by mouth daily.   Marland Kitchen levothyroxine (SYNTHROID) 125 MCG tablet Take 1 tablet (125 mcg total) by mouth daily before breakfast.  . losartan (COZAAR) 100 MG tablet Take 0.5 tablets (50 mg total) by mouth daily.  . metoprolol tartrate (LOPRESSOR) 25 MG tablet Take 1 tablet (25 mg total) by mouth 2 (two) times daily with a meal.  . Multiple Vitamins-Minerals (PRESERVISION AREDS 2 PO) Take 1 capsule by mouth 2 (two) times daily.  . pravastatin (PRAVACHOL) 80 MG tablet TAKE 1 TABLET BY MOUTH EVERY DAY  . vitamin C (ASCORBIC ACID) 500 MG  tablet Take 500 mg by mouth daily.   No facility-administered encounter medications on file as of 04/10/2021.    Review of Systems:  Review of Systems  Constitutional:  Negative for activity change, appetite change, chills, diaphoresis, fatigue, fever and unexpected weight change.  HENT:  Negative for congestion.   Respiratory:  Negative for cough, shortness of breath and wheezing.   Cardiovascular:  Negative for chest pain, palpitations and leg swelling.  Gastrointestinal:  Negative for abdominal distention, abdominal pain, constipation and diarrhea.  Genitourinary:  Negative for difficulty urinating and dysuria.  Musculoskeletal:  Negative for arthralgias, back pain, gait problem, joint swelling and myalgias.  Neurological:  Negative for dizziness, tremors, seizures, syncope, facial asymmetry, speech difficulty, weakness, light-headedness, numbness and headaches.  Psychiatric/Behavioral:  Negative for agitation, behavioral problems and confusion.    Health Maintenance  Topic Date Due  . INFLUENZA VACCINE  02/27/2021  . TETANUS/TDAP  02/26/2030  . DEXA SCAN  Completed  . COVID-19 Vaccine  Completed  . Zoster Vaccines- Shingrix  Completed  . HPV VACCINES  Aged Out  . PNA vac Low Risk Adult  Discontinued    Physical Exam: There were no vitals filed for this visit. There is no height or weight on file to calculate BMI. Physical Exam Vitals reviewed.  Constitutional:      General: She is not in acute distress.    Appearance: She is not diaphoretic.  HENT:     Head: Normocephalic and atraumatic.  Neck:     Vascular: No JVD.  Cardiovascular:     Rate and Rhythm: Normal rate and regular rhythm.     Heart sounds: No murmur heard. Pulmonary:     Effort: Pulmonary effort is normal. No respiratory distress.     Breath sounds: Normal breath sounds. No wheezing.  Abdominal:     General: Bowel sounds are normal. There is no distension.     Palpations: Abdomen is soft.      Tenderness: There is no abdominal tenderness.  Skin:    General: Skin is warm and dry.  Neurological:     Mental Status: She is alert and oriented to person, place, and time.  Psychiatric:        Mood and Affect: Mood normal.    Labs reviewed: Basic Metabolic Panel: Recent Labs    06/20/20 1143 07/04/20 0000 04/06/21 0000  NA 137 135* 139  K 5.4* 3.9 4.5  CL 101 99 101  CO2 29 27* 28*  GLUCOSE 108*  --   --   BUN '18 14 16  '$ CREATININE 0.90 0.7 0.9  CALCIUM 9.4 8.9 9.6  TSH  --   --  0.46    Liver Function Tests: Recent Labs    04/06/21 0000  AST  24  ALT 15  ALKPHOS 78  ALBUMIN 4.1    No results for input(s): LIPASE, AMYLASE in the last 8760 hours. No results for input(s): AMMONIA in the last 8760 hours. CBC: Recent Labs    06/20/20 1143 07/02/20 0405 04/06/21 0000  WBC 6.2 13.4* 4.4  HGB 12.9 12.0 12.9  HCT 40.1 37.6 38  MCV 90.3 92.2  --   PLT 273 203 215    Lipid Panel: Recent Labs    08/02/20 0000  CHOL 153  153  HDL 58  58  LDLCALC 70  70  TRIG 127  127    Lab Results  Component Value Date   HGBA1C 5.5 06/16/2014    Procedures since last visit: No results found.  Assessment/Plan  1. Aortic atherosclerosis ([place]) Found on CT of the abd 11/2019  2. Hypothyroidism, unspecified type ***  3. Pure hypercholesterolemia ***  4. Continuous leakage of urine ***  5. Essential hypertension ***  6. Chronic diastolic congestive heart failure (HCC) ***   Labs/tests ordered:  * No order type specified * Next appt:  Visit date not found   Total time **:  time greater than 50% of total time spent doing pt counseling and coordination of care       This encounter was created in error - please disregard.

## 2021-04-20 ENCOUNTER — Other Ambulatory Visit: Payer: Self-pay | Admitting: *Deleted

## 2021-04-20 DIAGNOSIS — E039 Hypothyroidism, unspecified: Secondary | ICD-10-CM

## 2021-04-20 MED ORDER — LEVOTHYROXINE SODIUM 125 MCG PO TABS: 137.0000 ug | ORAL_TABLET | Freq: Every day | ORAL | 0 refills | Status: DC

## 2021-04-20 NOTE — Telephone Encounter (Signed)
Pharmacy requested refill

## 2021-05-01 ENCOUNTER — Other Ambulatory Visit: Payer: Self-pay

## 2021-05-01 ENCOUNTER — Non-Acute Institutional Stay: Payer: Medicare Other | Admitting: Adult Health

## 2021-05-01 ENCOUNTER — Encounter: Payer: Self-pay | Admitting: Adult Health

## 2021-05-01 VITALS — BP 136/84 | HR 72 | Temp 96.2°F | Ht 64.0 in | Wt 270.8 lb

## 2021-05-01 DIAGNOSIS — R0609 Other forms of dyspnea: Secondary | ICD-10-CM | POA: Diagnosis not present

## 2021-05-01 DIAGNOSIS — E039 Hypothyroidism, unspecified: Secondary | ICD-10-CM

## 2021-05-01 DIAGNOSIS — I10 Essential (primary) hypertension: Secondary | ICD-10-CM | POA: Diagnosis not present

## 2021-05-01 DIAGNOSIS — H353131 Nonexudative age-related macular degeneration, bilateral, early dry stage: Secondary | ICD-10-CM | POA: Diagnosis not present

## 2021-05-01 DIAGNOSIS — E7800 Pure hypercholesterolemia, unspecified: Secondary | ICD-10-CM

## 2021-05-01 DIAGNOSIS — I5032 Chronic diastolic (congestive) heart failure: Secondary | ICD-10-CM | POA: Diagnosis not present

## 2021-05-01 NOTE — Progress Notes (Signed)
Location:  [facility]  POS: clinic  Provider:  Cindi Carbon, [place] [phone]   Code Status: DNR Goals of Care:  Advanced Directives 05/01/2021  Does Patient Have a Medical Advance Directive? Yes  Type of Advance Directive Out of facility DNR (pink MOST or yellow form);Living will  Does patient want to make changes to medical advance directive? No - Patient declined  Copy of [place] in Chart? -  Pre-existing out of facility DNR order (yellow form or pink MOST form) Yellow form placed in chart (order not valid for inpatient use)     Chief Complaint  Patient presents with   Medical Management of Chronic Issues   Quality Metric Gaps    Flu shot    HPI: Patient is a 87 y.o. female seen today for medical management of chronic diseases.   PMH significant for HTN, diastolic CHF, aortic atherosclerosis, cough variant asthma, hypothyroid, hearing loss, knee replacement with failure, transient global ischemia, HLD< anemia, macular degeneration, incontinence.  She is in a study for ambulation and is walking regularly   Drinks 2-3 beers most nights  Reports she needs to lose weight  Sleeping well with trazodone  Very sad as she lost her 79 y.o. brother recently  Going to see ortho due to some left knee pain  Has some sob with exertion which is chronic and unchanged. No chest pain. Minimal edema in her feet. She says she has gained weight but is not eating healthy.    Wt Readings from Last 3 Encounters:  05/01/21 270 lb 12.8 oz (122.8 kg)  12/05/20 267 lb (121.1 kg)  08/10/20 260 lb (117.9 kg)     Past Medical History:  Diagnosis Date   Arthritis    Cancer ([place])    basal cell skin cancer   Concussion    Dyspnea    upon exertion   Hypertension    Hypothyroidism    Stress incontinence    Total knee replacement status, bilateral     Past Surgical History:  Procedure Laterality Date   ABDOMINAL HYSTERECTOMY      APPENDECTOMY     CARPAL TUNNEL RELEASE     rt x2   COLONOSCOPY     EYE SURGERY Bilateral    cataract surgery with lens implant   FINGER AMPUTATION Right    little finger   I & D EXTREMITY  03/11/2012   Procedure: MINOR IRRIGATION AND DEBRIDEMENT EXTREMITY;  Surgeon: Cammie Sickle., MD;  Location: [place];  Service: Orthopedics;  Laterality: Right;  Right little finger   KNEE ARTHROSCOPY     bilat    REPLACEMENT TOTAL KNEE BILATERAL     REVERSE SHOULDER ARTHROPLASTY Right 09/04/2018   Procedure: REVERSE SHOULDER ARTHROPLASTY;  Surgeon: Tania Ade, MD;  Location: [place];  Service: Orthopedics;  Laterality: Right;   TONSILLECTOMY     TOTAL KNEE REVISION Right 03/14/2015   Procedure: TIBIAL REVISION, OPEN REDUCTION INTERNAL FIXATION PROXIMAL TIBIA;  Surgeon: Dorna Leitz, MD;  Location: [place];  Service: Orthopedics;  Laterality: Right;   TOTAL KNEE REVISION Left 07/01/2020   Procedure: TOTAL KNEE REVISION;  Surgeon: Dorna Leitz, MD;  Location[facility];  Service: Orthopedics;  Laterality: Left;   TRIGGER FINGER RELEASE     TUBAL LIGATION      Allergies  Allergen Reactions   Phenergan [Promethazine Hcl] Other (See Comments)    hallucinations   Tizanidine     delirium  Lisinopril Cough    Outpatient Encounter Medications as of 05/01/2021  Medication Sig   acetaminophen (TYLENOL) 500 MG tablet Take 1,000 mg by mouth every 6 (six) hours as needed for moderate pain.   aspirin EC 81 MG tablet Take 81 mg by mouth daily.   Calcium Carb-Cholecalciferol (CALCIUM 600/VITAMIN D3 PO) Take 1 tablet by mouth daily.   Cholecalciferol (VITAMIN D-3) 1000 units CAPS Take 1,000 Units by mouth daily.    COVID-19 mRNA vaccine, Moderna, 100 MCG/0.5ML injection Inject into the muscle.   docusate sodium (COLACE) 100 MG capsule Take 100 mg by mouth daily.   ferrous sulfate 325 (65 FE) MG tablet Take 325 mg by mouth daily with breakfast.    folic acid (FOLVITE) 300 MCG tablet Take 800  mcg by mouth daily.    levothyroxine (SYNTHROID) 125 MCG tablet Take 1 tablet (125 mcg total) by mouth daily before breakfast. Needs an appointment before anymore future refills.   Lidocaine (HM LIDOCAINE PATCH) 4 % PTCH Apply topically as needed.   losartan (COZAAR) 100 MG tablet Take 0.5 tablets (50 mg total) by mouth daily.   metoprolol tartrate (LOPRESSOR) 25 MG tablet Take 1 tablet (25 mg total) by mouth 2 (two) times daily with a meal.   Multiple Vitamins-Minerals (PRESERVISION AREDS 2 PO) Take 1 capsule by mouth 2 (two) times daily.   pravastatin (PRAVACHOL) 80 MG tablet TAKE 1 TABLET BY MOUTH EVERY DAY   vitamin C (ASCORBIC ACID) 500 MG tablet Take 500 mg by mouth daily.   No facility-administered encounter medications on file as of 05/01/2021.    Review of Systems:  Review of Systems  Constitutional:  Negative for activity change, appetite change, chills, diaphoresis, fatigue, fever and unexpected weight change.  HENT:  Negative for congestion.   Respiratory:  Negative for cough (intermittent), shortness of breath (with exertion) and wheezing.   Cardiovascular:  Negative for chest pain, palpitations and leg swelling.  Gastrointestinal:  Negative for abdominal distention, abdominal pain, constipation and diarrhea.  Genitourinary:  Negative for difficulty urinating and dysuria.  Musculoskeletal:  Positive for arthralgias. Negative for back pain, gait problem, joint swelling and myalgias.  Neurological:  Negative for dizziness, tremors, seizures, syncope, facial asymmetry, speech difficulty, weakness, light-headedness, numbness and headaches.  Psychiatric/Behavioral:  Negative for agitation, behavioral problems, confusion and sleep disturbance.    Health Maintenance  Topic Date Due   INFLUENZA VACCINE  02/27/2021   TETANUS/TDAP  02/26/2030   DEXA SCAN  Completed   COVID-19 Vaccine  Completed   Zoster Vaccines- Shingrix  Completed   HPV VACCINES  Aged Out    Physical  Exam: Vitals:   05/01/21 1357  BP: 136/84  Pulse: 72  Temp: (!) 96.2 F (35.7 C)  SpO2: 98%  Weight: 270 lb 12.8 oz (122.8 kg)  Height: 5\' 4"  (1.626 m)   Body mass index is 46.48 kg/m. Physical Exam Vitals and nursing note reviewed.  Constitutional:      General: She is not in acute distress.    Appearance: She is not diaphoretic.  HENT:     Head: Normocephalic and atraumatic.     Right Ear: Tympanic membrane and ear canal normal.     Left Ear: Tympanic membrane and ear canal normal.     Nose: Nose normal. No congestion.     Mouth/Throat:     Mouth: Mucous membranes are moist.     Pharynx: Oropharynx is clear.  Eyes:     Conjunctiva/sclera: Conjunctivae normal.  Pupils: Pupils are equal, round, and reactive to light.  Neck:     Vascular: No JVD.  Cardiovascular:     Rate and Rhythm: Normal rate and regular rhythm.     Heart sounds: No murmur heard. Pulmonary:     Effort: Pulmonary effort is normal. No respiratory distress.     Breath sounds: Normal breath sounds. No wheezing.  Abdominal:     General: Bowel sounds are normal. There is no distension.     Palpations: Abdomen is soft.     Tenderness: There is no abdominal tenderness.  Musculoskeletal:     Right lower leg: No edema.     Left lower leg: Edema (trace) present.  Skin:    General: Skin is warm and dry.  Neurological:     Mental Status: She is alert and oriented to person, place, and time.  Psychiatric:        Mood and Affect: Mood normal.    Labs reviewed: Basic Metabolic Panel: Recent Labs    06/20/20 1143 07/04/20 0000 04/06/21 0000  NA 137 135* 139  K 5.4* 3.9 4.5  CL 101 99 101  CO2 29 27* 28*  GLUCOSE 108*  --   --   BUN 18 14 16   CREATININE 0.90 0.7 0.9  CALCIUM 9.4 8.9 9.6  TSH  --   --  0.46   Liver Function Tests: Recent Labs    04/06/21 0000  AST 24  ALT 15  ALKPHOS 78  ALBUMIN 4.1   No results for input(s): LIPASE, AMYLASE in the last 8760 hours. No results for  input(s): AMMONIA in the last 8760 hours. CBC: Recent Labs    06/20/20 1143 07/02/20 0405 04/06/21 0000  WBC 6.2 13.4* 4.4  HGB 12.9 12.0 12.9  HCT 40.1 37.6 38  MCV 90.3 92.2  --   PLT 273 203 215   Lipid Panel: Recent Labs    08/02/20 0000 04/06/21 0000  CHOL 153  153 155  HDL 58  58 57  LDLCALC 70  70 80  TRIG 127  127 93   Lab Results  Component Value Date   HGBA1C 5.5 06/16/2014    Procedures since last visit: No results found.  Assessment/Plan 1. Pure hypercholesterolemia At goal Continue pravastatin  2. Essential hypertension Controlled Continue losartan and lopressor  3. Acquired hypothyroidism Lab Results  Component Value Date   TSH 0.46 04/06/2021   Continue synthroid Check TSH annually  4. Chronic diastolic congestive heart failure ([place]) She is having DOE which is unchanged Gaining weight which seems to be due to increased intake. No change in edema or rales on exam.  She should call us if she is having worsening symptoms of DOE or edema.   5. Morbid (severe) obesity due to excess calories ([place]) Recommend reduced carbs/fats/smaller portions   6. Dyspnea on exertion Chronic and unchanged Obesity and age play a major roll here, also may have small component of diastolic CHF   7. 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    Labs/tests ordered:  * No order type specified * BMP prior to next apt Next appt: 6 months with Dr. Lyndel Safe  Recommend covid and flu vaccines    Total time 40 min:  time greater than 50% of total time spent doing pt counseling and coordination of care

## 2021-05-04 ENCOUNTER — Other Ambulatory Visit: Payer: Self-pay | Admitting: Internal Medicine

## 2021-05-04 ENCOUNTER — Other Ambulatory Visit: Payer: Self-pay | Admitting: *Deleted

## 2021-05-04 DIAGNOSIS — E039 Hypothyroidism, unspecified: Secondary | ICD-10-CM

## 2021-05-04 MED ORDER — TRAZODONE HCL 50 MG PO TABS: 50.0000 mg | ORAL_TABLET | Freq: Every day | ORAL | 0 refills | Status: DC

## 2021-05-04 NOTE — Telephone Encounter (Signed)
Patient called requesting refill on medication. Patient was seen 10/3.  Pended Rx and sent to [facility] for approval due to no recent refill.

## 2021-05-11 DIAGNOSIS — M25562 Pain in left knee: Secondary | ICD-10-CM | POA: Diagnosis not present

## 2021-05-11 DIAGNOSIS — M25552 Pain in left hip: Secondary | ICD-10-CM | POA: Diagnosis not present

## 2021-05-16 DIAGNOSIS — H353132 Nonexudative age-related macular degeneration, bilateral, intermediate dry stage: Secondary | ICD-10-CM | POA: Diagnosis not present

## 2021-05-22 DIAGNOSIS — D1801 Hemangioma of skin and subcutaneous tissue: Secondary | ICD-10-CM | POA: Diagnosis not present

## 2021-05-22 DIAGNOSIS — L820 Inflamed seborrheic keratosis: Secondary | ICD-10-CM | POA: Diagnosis not present

## 2021-05-22 DIAGNOSIS — L538 Other specified erythematous conditions: Secondary | ICD-10-CM | POA: Diagnosis not present

## 2021-05-22 DIAGNOSIS — L814 Other melanin hyperpigmentation: Secondary | ICD-10-CM | POA: Diagnosis not present

## 2021-05-22 DIAGNOSIS — L821 Other seborrheic keratosis: Secondary | ICD-10-CM | POA: Diagnosis not present

## 2021-05-22 DIAGNOSIS — L570 Actinic keratosis: Secondary | ICD-10-CM | POA: Diagnosis not present

## 2021-05-22 DIAGNOSIS — Z789 Other specified health status: Secondary | ICD-10-CM | POA: Diagnosis not present

## 2021-07-29 ENCOUNTER — Other Ambulatory Visit: Payer: Self-pay | Admitting: Internal Medicine

## 2021-07-29 DIAGNOSIS — I10 Essential (primary) hypertension: Secondary | ICD-10-CM

## 2021-08-01 ENCOUNTER — Other Ambulatory Visit: Payer: Self-pay | Admitting: Adult Health

## 2021-08-23 ENCOUNTER — Other Ambulatory Visit: Payer: Self-pay | Admitting: Adult Health

## 2021-08-23 ENCOUNTER — Other Ambulatory Visit: Payer: Self-pay | Admitting: Internal Medicine

## 2021-08-23 DIAGNOSIS — E039 Hypothyroidism, unspecified: Secondary | ICD-10-CM

## 2021-08-23 MED ORDER — LEVOTHYROXINE SODIUM 125 MCG PO TABS: 137.0000 ug | ORAL_TABLET | Freq: Every day | ORAL | 1 refills | Status: DC

## 2021-08-23 NOTE — Telephone Encounter (Signed)
Patient called requesting refill.  Also updated flu vaccine.

## 2021-10-31 DIAGNOSIS — I158 Other secondary hypertension: Secondary | ICD-10-CM | POA: Diagnosis not present

## 2021-10-31 LAB — BASIC METABOLIC PANEL
BUN: 15 (ref 4–21)
CO2: 28 — AB (ref 13–22)
Chloride: 102 (ref 99–108)
Creatinine: 0.9 (ref 0.5–1.1)
Glucose: 98
Potassium: 4.6 mEq/L (ref 3.5–5.1)
Sodium: 140 (ref 137–147)

## 2021-10-31 LAB — COMPREHENSIVE METABOLIC PANEL: Calcium: 9.5 (ref 8.7–10.7)

## 2021-11-01 ENCOUNTER — Encounter: Payer: Medicare Other | Admitting: Internal Medicine

## 2021-11-07 ENCOUNTER — Other Ambulatory Visit: Payer: Self-pay | Admitting: Internal Medicine

## 2021-11-07 DIAGNOSIS — E7800 Pure hypercholesterolemia, unspecified: Secondary | ICD-10-CM

## 2021-11-10 ENCOUNTER — Encounter: Payer: Self-pay | Admitting: Adult Health

## 2021-11-13 ENCOUNTER — Encounter: Payer: Medicare Other | Admitting: Adult Health

## 2021-11-17 ENCOUNTER — Encounter: Payer: Self-pay | Admitting: Adult Health

## 2021-11-17 NOTE — Progress Notes (Signed)
This encounter was created in error - please disregard.

## 2021-11-20 ENCOUNTER — Non-Acute Institutional Stay: Payer: Medicare Other | Admitting: Adult Health

## 2021-11-20 ENCOUNTER — Encounter: Payer: Self-pay | Admitting: Adult Health

## 2021-11-20 VITALS — BP 168/74 | HR 63 | Temp 97.9°F | Ht 64.0 in | Wt 270.4 lb

## 2021-11-20 DIAGNOSIS — E7800 Pure hypercholesterolemia, unspecified: Secondary | ICD-10-CM | POA: Diagnosis not present

## 2021-11-20 DIAGNOSIS — R0609 Other forms of dyspnea: Secondary | ICD-10-CM

## 2021-11-20 DIAGNOSIS — E2839 Other primary ovarian failure: Secondary | ICD-10-CM | POA: Diagnosis not present

## 2021-11-20 DIAGNOSIS — H9113 Presbycusis, bilateral: Secondary | ICD-10-CM | POA: Diagnosis not present

## 2021-11-20 DIAGNOSIS — I10 Essential (primary) hypertension: Secondary | ICD-10-CM | POA: Diagnosis not present

## 2021-11-20 DIAGNOSIS — E039 Hypothyroidism, unspecified: Secondary | ICD-10-CM

## 2021-11-20 DIAGNOSIS — N3945 Continuous leakage: Secondary | ICD-10-CM

## 2021-11-20 MED ORDER — LEVOTHYROXINE SODIUM 125 MCG PO TABS: 137.0000 ug | ORAL_TABLET | Freq: Every day | ORAL | 1 refills | Status: DC

## 2021-11-20 NOTE — Patient Instructions (Addendum)
Recommend covid vaccination ? ?Recommend bone density ? ?Recommend trying to reduce portion sizes and have a small snack in the afternoon to avoid a large dinner ? ?See clinic nurse and check bp if consistently >150/90 x 2 let me know.  ?

## 2021-11-20 NOTE — Progress Notes (Signed)
? ?Location:  [facility] ? ?POS: clinic ? ?Provider:  ?Cindi Carbon, ANP ?[place] ?[phone] ? ? ?Code Status: DNR ?Goals of Care:  ? ?  11/20/2021  ?  1:24 PM  ?Advanced Directives  ?Does Patient Have a Medical Advance Directive? Yes  ?Type of Advance Directive Out of facility DNR (pink MOST or yellow form);Living will  ?Does patient want to make changes to medical advance directive? No - Patient declined  ?Pre-existing out of facility DNR order (yellow form or pink MOST form) Yellow form placed in chart (order not valid for inpatient use)  ? ? ? ?Chief Complaint  ?Patient presents with  ? Medical Management of Chronic Issues  ? Quality Metric Gaps  ?  Verified [facility] and [facility] patient is due for #5 covid-19 vaccine and Pneumonia.   ? ? ?HPI: Patient is a 88 y.o. female seen today for medical management of chronic diseases.   ?PMH significant for HTN, diastolic CHF, aortic atherosclerosis, cough variant asthma, hypothyroid, hearing loss, knee replacement with failure, transient global ischemia, HLD, anemia, macular degeneration, incontinence. ? ? ?Frustrated that she weights 270, most she has ever weighed.  ?Has some sob with exertion which is chronic and unchanged. No chest pain. Feels her legs are puffy. No sob when lying down.  ? ?Walks about once a week.  ?Drink one beer per day but none in the past week. Does drink wine once a week.  ?Eats two meals per day.  Large meal at dinner.  ? ?Wt Readings from Last 3 Encounters:  ?11/20/21 270 lb 6.4 oz (122.7 kg)  ?05/01/21 270 lb 12.8 oz (122.8 kg)  ?12/05/20 267 lb (121.1 kg)  ? ?Reports her back hurts and has lost two inches in height.  ? ?Has leakage and wears pads. Not large volume.  ? ?Past Medical History:  ?Diagnosis Date  ? Arthritis   ? Cancer [facility])   ? basal cell skin cancer  ? Concussion   ? Dyspnea   ? upon exertion  ? Hypertension   ? Hypothyroidism   ? Stress incontinence   ? Total knee replacement status, bilateral   ? ? ?Past Surgical  History:  ?Procedure Laterality Date  ? ABDOMINAL HYSTERECTOMY    ? APPENDECTOMY    ? CARPAL TUNNEL RELEASE    ? rt x2  ? COLONOSCOPY    ? EYE SURGERY Bilateral   ? cataract surgery with lens implant  ? FINGER AMPUTATION Right   ? little finger  ? I & D EXTREMITY  03/11/2012  ? Procedure: MINOR IRRIGATION AND DEBRIDEMENT EXTREMITY;  Surgeon: Cammie Sickle., MD;  Location: [place];  Service: Orthopedics;  Laterality: Right;  Right little finger  ? KNEE ARTHROSCOPY    ? bilat   ? REPLACEMENT TOTAL KNEE BILATERAL    ? REVERSE SHOULDER ARTHROPLASTY Right 09/04/2018  ? Procedure: REVERSE SHOULDER ARTHROPLASTY;  Surgeon: Tania Ade, MD;  Location: [place];  Service: Orthopedics;  Laterality: Right;  ? TONSILLECTOMY    ? TOTAL KNEE REVISION Right 03/14/2015  ? Procedure: TIBIAL REVISION, OPEN REDUCTION INTERNAL FIXATION PROXIMAL TIBIA;  Surgeon: Dorna Leitz, MD;  Location: [place];  Service: Orthopedics;  Laterality: Right;  ? TOTAL KNEE REVISION Left 07/01/2020  ? Procedure: TOTAL KNEE REVISION;  Surgeon: Dorna Leitz, MD;  Location[facility];  Service: Orthopedics;  Laterality: Left;  ? TRIGGER FINGER RELEASE    ? TUBAL LIGATION    ? ? ?Allergies  ?Allergen Reactions  ?  Phenergan [Promethazine Hcl] Other (See Comments)  ?  hallucinations  ? Tizanidine   ?  delirium  ? Lisinopril Cough  ? ? ?Outpatient Encounter Medications as of 11/20/2021  ?Medication Sig  ? aspirin EC 81 MG tablet Take 81 mg by mouth daily.  ? Calcium Carb-Cholecalciferol (CALCIUM 600/VITAMIN D3 PO) Take 1 tablet by mouth daily.  ? Cholecalciferol (VITAMIN D-3) 1000 units CAPS Take 1,000 Units by mouth daily.   ? ferrous sulfate 325 (65 FE) MG tablet Take 325 mg by mouth daily with breakfast.   ? folic acid (FOLVITE) 196 MCG tablet Take 800 mcg by mouth daily.   ? levothyroxine (SYNTHROID) 125 MCG tablet Take 1 tablet (125 mcg total) by mouth daily before breakfast.  ? lidocaine 4 % Apply topically as needed.  ? losartan (COZAAR) 100  MG tablet TAKE 1/2 TABLET BY MOUTH DAILY  ? metoprolol tartrate (LOPRESSOR) 25 MG tablet TAKE 1 TABLET (25 MG TOTAL) BY MOUTH 2 (TWO) TIMES DAILY WITH A MEAL.  ? pravastatin (PRAVACHOL) 80 MG tablet TAKE 1 TABLET BY MOUTH EVERY DAY  ? traZODone (DESYREL) 50 MG tablet TAKE 1 TABLET BY MOUTH EVERYDAY AT BEDTIME  ? vitamin C (ASCORBIC ACID) 500 MG tablet Take 500 mg by mouth daily.  ? [DISCONTINUED] acetaminophen (TYLENOL) 500 MG tablet Take 1,000 mg by mouth every 6 (six) hours as needed for moderate pain.  ? [DISCONTINUED] docusate sodium (COLACE) 100 MG capsule Take 100 mg by mouth daily.  ? [DISCONTINUED] Multiple Vitamins-Minerals (PRESERVISION AREDS 2 PO) Take 1 capsule by mouth 2 (two) times daily.  ? ?No facility-administered encounter medications on file as of 11/20/2021.  ? ? ?Review of Systems:  ?Review of Systems  ?Constitutional:  Negative for activity change, appetite change, chills, diaphoresis, fatigue, fever and unexpected weight change.  ?HENT:  Negative for congestion.   ?Respiratory:  Negative for cough (intermittent), shortness of breath (with exertion) and wheezing.   ?Cardiovascular:  Negative for chest pain, palpitations and leg swelling.  ?Gastrointestinal:  Negative for abdominal distention, abdominal pain, constipation and diarrhea.  ?Genitourinary:  Negative for difficulty urinating and dysuria.  ?     Leakage ?  ?Musculoskeletal:  Positive for arthralgias. Negative for back pain, gait problem, joint swelling and myalgias.  ?Neurological:  Negative for dizziness, tremors, seizures, syncope, facial asymmetry, speech difficulty, weakness, light-headedness, numbness and headaches.  ?Psychiatric/Behavioral:  Negative for agitation, behavioral problems, confusion and sleep disturbance.   ? ?Health Maintenance  ?Topic Date Due  ? Pneumonia Vaccine 27+ Years old (58) 02/28/2016  ? COVID-19 Vaccine (4 - Booster for Moderna series) 03/10/2021  ? INFLUENZA VACCINE  02/27/2022  ? TETANUS/TDAP  02/26/2030   ? DEXA SCAN  Completed  ? Zoster Vaccines- Shingrix  Completed  ? HPV VACCINES  Aged Out  ? ? ?Physical Exam: ?Vitals:  ? 11/20/21 1324  ?BP: (!) 192/88  ?Pulse: 63  ?Temp: 97.9 ?F (36.6 ?C)  ?SpO2: 97%  ?Weight: 270 lb 6.4 oz (122.7 kg)  ?Height: '5\' 4"'$  (1.626 m)  ? ?Body mass index is 46.41 kg/m?Marland Kitchen ?Physical Exam ?Vitals and nursing note reviewed.  ?Constitutional:   ?   General: She is not in acute distress. ?   Appearance: She is not diaphoretic.  ?HENT:  ?   Head: Normocephalic and atraumatic.  ?   Right Ear: Tympanic membrane and ear canal normal.  ?   Left Ear: Tympanic membrane and ear canal normal.  ?   Nose: Nose normal. No congestion.  ?  Mouth/Throat:  ?   Mouth: Mucous membranes are moist.  ?   Pharynx: Oropharynx is clear.  ?Eyes:  ?   Conjunctiva/sclera: Conjunctivae normal.  ?   Pupils: Pupils are equal, round, and reactive to light.  ?Neck:  ?   Vascular: No JVD.  ?Cardiovascular:  ?   Rate and Rhythm: Normal rate and regular rhythm.  ?   Heart sounds: No murmur heard. ?Pulmonary:  ?   Effort: Pulmonary effort is normal. No respiratory distress.  ?   Breath sounds: Normal breath sounds. No wheezing.  ?Abdominal:  ?   General: Bowel sounds are normal. There is no distension.  ?   Palpations: Abdomen is soft.  ?   Tenderness: There is no abdominal tenderness.  ?Musculoskeletal:  ?   Right lower leg: No edema.  ?   Left lower leg: No edema.  ?Lymphadenopathy:  ?   Cervical: Cervical adenopathy (right soft not tender, mobile) present.  ?Skin: ?   General: Skin is warm and dry.  ?Neurological:  ?   Mental Status: She is alert and oriented to person, place, and time.  ?Psychiatric:     ?   Mood and Affect: Mood normal.  ? ? ?Labs reviewed: ?Basic Metabolic Panel: ?Recent Labs  ?  04/06/21 ?0000 10/31/21 ?0000  ?NA 139 140  ?K 4.5 4.6  ?CL 101 102  ?CO2 28* 28*  ?BUN 16 15  ?CREATININE 0.9 0.9  ?CALCIUM 9.6 9.5  ?TSH 0.46  --   ? ?Liver Function Tests: ?Recent Labs  ?  04/06/21 ?0000  ?AST 24  ?ALT 15   ?ALKPHOS 78  ?ALBUMIN 4.1  ? ?No results for input(s): LIPASE, AMYLASE in the last 8760 hours. ?No results for input(s): AMMONIA in the last 8760 hours. ?CBC: ?Recent Labs  ?  04/06/21 ?0000  ?WBC 4.4

## 2021-12-04 ENCOUNTER — Other Ambulatory Visit: Payer: Self-pay | Admitting: Internal Medicine

## 2021-12-04 DIAGNOSIS — I10 Essential (primary) hypertension: Secondary | ICD-10-CM

## 2021-12-08 ENCOUNTER — Encounter (HOSPITAL_BASED_OUTPATIENT_CLINIC_OR_DEPARTMENT_OTHER): Payer: Self-pay | Admitting: Emergency Medicine

## 2021-12-08 ENCOUNTER — Other Ambulatory Visit: Payer: Self-pay

## 2021-12-08 ENCOUNTER — Emergency Department (HOSPITAL_BASED_OUTPATIENT_CLINIC_OR_DEPARTMENT_OTHER)
Admission: EM | Admit: 2021-12-08 | Discharge: 2021-12-08 | Disposition: A | Discharge status: Home / Self Care | Payer: Medicare Other | Attending: Emergency Medicine | Admitting: Emergency Medicine

## 2021-12-08 DIAGNOSIS — I10 Essential (primary) hypertension: Secondary | ICD-10-CM | POA: Insufficient documentation

## 2021-12-08 NOTE — ED Triage Notes (Signed)
Pt resides at [facility]. The nurse took her BP today and found it to be high. She states she is compliant with her BP meds. Pt states she feels normal and has no complaints.  ?

## 2021-12-08 NOTE — ED Provider Notes (Signed)
?[place] EMERGENCY DEPT ?Provider Note ? ? ?CSN: [id number] ?Arrival date & time: 12/08/21  1103 ? ?  ? ?History ? ?Chief Complaint  ?Patient presents with  ? Hypertension  ? ? ?Samantha Stone is a 88 y.o. female with history of hypertension who presents the emergency department with elevated blood pressure.  Patient is a resident at [facility] and had a checkup today.  The nurse took her blood pressure and found it to be high.  She states that she is compliant with her blood pressure medication.  She reports feeling well, and has no complaints at this time.  Currently follows up with her primary doctor for hypertension and is on losartan and metoprolol. ? ? ?Hypertension ?Pertinent negatives include no chest pain, no abdominal pain and no headaches.  ? ?  ? ?Home Medications ?Prior to Admission medications   ?Medication Sig Start Date End Date Taking? Authorizing Provider  ?aspirin EC 81 MG tablet Take 81 mg by mouth daily.    [provider]  ?Calcium Carb-Cholecalciferol (CALCIUM 600/VITAMIN D3 PO) Take 1 tablet by mouth daily.    [provider]  ?Cholecalciferol (VITAMIN D-3) 1000 units CAPS Take 1,000 Units by mouth daily.     [provider]  ?ferrous sulfate 325 (65 FE) MG tablet Take 325 mg by mouth daily with breakfast.     [provider]  ?folic acid (FOLVITE) 546 MCG tablet Take 800 mcg by mouth daily.     [provider]  ?levothyroxine (SYNTHROID) 125 MCG tablet Take 1 tablet (125 mcg total) by mouth daily before breakfast. 11/20/21   Royal Hawthorn, NP  ?lidocaine 4 % Apply topically as needed.    [provider]  ?losartan (COZAAR) 100 MG tablet TAKE 1/2 TABLET BY MOUTH DAILY 08/01/21   Virgie Dad, MD  ?metoprolol tartrate (LOPRESSOR) 25 MG tablet TAKE 1 TABLET (25 MG TOTAL) BY MOUTH 2 (TWO) TIMES DAILY WITH A MEAL. 11/08/21   Virgie Dad, MD  ?pravastatin (PRAVACHOL) 80 MG tablet TAKE 1 TABLET BY MOUTH  EVERY DAY 11/08/21   Virgie Dad, MD  ?traZODone (DESYREL) 50 MG tablet TAKE 1 TABLET BY MOUTH EVERYDAY AT BEDTIME 11/08/21   Virgie Dad, MD  ?vitamin C (ASCORBIC ACID) 500 MG tablet Take 500 mg by mouth daily.    [provider]  ?   ? ?Allergies    ?Phenergan [promethazine hcl], Tizanidine, and Lisinopril   ? ?Review of Systems   ?Review of Systems  ?Eyes:  Negative for visual disturbance.  ?Respiratory:  Negative for chest tightness.   ?Cardiovascular:  Negative for chest pain, palpitations and leg swelling.  ?Gastrointestinal:  Negative for abdominal pain.  ?Neurological:  Negative for syncope, light-headedness and headaches.  ?All other systems reviewed and are negative. ? ?Physical Exam ?Updated Vital Signs ?BP (!) 171/83 (BP Location: Right Arm)   Pulse 62   Temp 98.4 ?F (36.9 ?C)   Resp 18   Ht '5\' 4"'$  (1.626 m)   Wt 120.7 kg   SpO2 100%   BMI 45.66 kg/m?  ?Physical Exam ?Vitals and nursing note reviewed.  ?Constitutional:   ?   Appearance: Normal appearance.  ?HENT:  ?   Head: Normocephalic and atraumatic.  ?Eyes:  ?   Conjunctiva/sclera: Conjunctivae normal.  ?Cardiovascular:  ?   Rate and Rhythm: Normal rate and regular rhythm.  ?Pulmonary:  ?   Effort: Pulmonary effort is normal. No respiratory distress.  ?  Breath sounds: Normal breath sounds.  ?Abdominal:  ?   General: There is no distension.  ?   Palpations: Abdomen is soft.  ?   Tenderness: There is no abdominal tenderness.  ?Skin: ?   General: Skin is warm and dry.  ?Neurological:  ?   General: No focal deficit present.  ?   Mental Status: She is alert.  ? ? ?ED Results / Procedures / Treatments   ?Labs ?(all labs ordered are listed, but only abnormal results are displayed) ?Labs Reviewed - No data to display ? ?EKG ?None ? ?Radiology ?No results found. ? ?Procedures ?Procedures  ? ? ?Medications Ordered in ED ?Medications - No data to display ? ?ED Course/ Medical Decision Making/ A&P ?  ?                        ?Medical  Decision Making ?This patient is a 88 y.o. female who presents to the ED for concern of elevated blood pressure.  ? ?Differential diagnoses prior to evaluation: ?Asymptomatic hypertension, hypertensive urgency/emergency ? ?Past Medical History / Co-morbidities / Social History: ?Hypertension, hypothyroidism, stress incontinence, dyspnea on exertion ? ?Additional history: ?Chart reviewed. Pertinent results include: Patient last seen by PCP on 4/24 and had blood pressures into the 308M systolic.  No medication change at that time. ? ?Physical Exam: ?Physical exam performed. The pertinent findings include: Normal vital signs.  No complaints at this time. ?  ?Disposition: ?After consideration of the diagnostic results and the patients response to treatment, I feel that patient's not requiring admission or inpatient treatment for symptoms.  Advised with asymptomatic hypertension that intervention is often not required with the blood pressure readings that she is having.  Recommended following up with her primary doctor about her blood pressure management.. Discussed reasons to return to the emergency department, and the patient is agreeable to the plan.  ? ?I discussed this case with my attending physician Dr. Melina Copa who cosigned this note including patient's presenting symptoms, physical exam, and planned diagnostics and interventions. Attending physician stated agreement with plan or made changes to plan which were implemented.   ? ?Final Clinical Impression(s) / ED Diagnoses ?Final diagnoses:  ?Asymptomatic hypertension  ? ? ?Rx / DC Orders ?ED Discharge Orders   ? ? None  ? ?  ? ?Portions of this report may have been transcribed using voice recognition software. Every effort was made to ensure accuracy; however, inadvertent computerized transcription errors may be present. ? ?  ?Kateri Plummer, PA-C ?12/08/21 1354 ? ?  ?Hayden Rasmussen, MD ?12/08/21 1758 ? ?

## 2021-12-08 NOTE — Discharge Instructions (Addendum)
You were seen in the emergency department today for hypertension. ? ?As we discussed as long as you are not having symptoms such as headache, lightheadedness, chest pain or passing out, I recommend following up with your primary doctor about your blood pressure management. ? ?I have also sent a message to your nurse practitioner a note to let them know you were seen today. ? ?Continue to monitor how you're doing and return to the ER for new or worsening symptoms.  ?

## 2021-12-11 ENCOUNTER — Encounter: Payer: Self-pay | Admitting: Adult Health

## 2021-12-11 ENCOUNTER — Non-Acute Institutional Stay: Payer: Medicare Other | Admitting: Adult Health

## 2021-12-11 VITALS — BP 152/82 | HR 59 | Temp 98.2°F | Resp 20 | Wt 267.6 lb

## 2021-12-11 DIAGNOSIS — F5101 Primary insomnia: Secondary | ICD-10-CM | POA: Diagnosis not present

## 2021-12-11 DIAGNOSIS — R410 Disorientation, unspecified: Secondary | ICD-10-CM

## 2021-12-11 DIAGNOSIS — I10 Essential (primary) hypertension: Secondary | ICD-10-CM

## 2021-12-11 DIAGNOSIS — N390 Urinary tract infection, site not specified: Secondary | ICD-10-CM | POA: Diagnosis not present

## 2021-12-11 DIAGNOSIS — E039 Hypothyroidism, unspecified: Secondary | ICD-10-CM

## 2021-12-11 DIAGNOSIS — D649 Anemia, unspecified: Secondary | ICD-10-CM | POA: Diagnosis not present

## 2021-12-11 DIAGNOSIS — I5032 Chronic diastolic (congestive) heart failure: Secondary | ICD-10-CM

## 2021-12-11 DIAGNOSIS — E782 Mixed hyperlipidemia: Secondary | ICD-10-CM

## 2021-12-11 DIAGNOSIS — Z6841 Body Mass Index (BMI) 40.0 and over, adult: Secondary | ICD-10-CM

## 2021-12-11 DIAGNOSIS — Z789 Other specified health status: Secondary | ICD-10-CM | POA: Diagnosis not present

## 2021-12-11 DIAGNOSIS — N3945 Continuous leakage: Secondary | ICD-10-CM

## 2021-12-11 DIAGNOSIS — R051 Acute cough: Secondary | ICD-10-CM

## 2021-12-11 DIAGNOSIS — I509 Heart failure, unspecified: Secondary | ICD-10-CM | POA: Diagnosis not present

## 2021-12-11 DIAGNOSIS — R059 Cough, unspecified: Secondary | ICD-10-CM | POA: Diagnosis not present

## 2021-12-11 LAB — HEPATIC FUNCTION PANEL
ALT: 19 U/L (ref 7–35)
AST: 36 — AB (ref 13–35)
Alkaline Phosphatase: 57 (ref 25–125)
Bilirubin, Total: 0.4

## 2021-12-11 LAB — CBC AND DIFFERENTIAL
HCT: 39 (ref 36–46)
Hemoglobin: 13 (ref 12.0–16.0)
Platelets: 209 10*3/uL (ref 150–400)
WBC: 3

## 2021-12-11 LAB — BASIC METABOLIC PANEL
BUN: 13 (ref 4–21)
CO2: 29 — AB (ref 13–22)
Chloride: 93 — AB (ref 99–108)
Creatinine: 1 (ref 0.5–1.1)
Glucose: 98
Potassium: 4.3 mEq/L (ref 3.5–5.1)
Sodium: 132 — AB (ref 137–147)

## 2021-12-11 LAB — COMPREHENSIVE METABOLIC PANEL
Albumin: 3.9 (ref 3.5–5.0)
Calcium: 9.2 (ref 8.7–10.7)
Globulin: 2

## 2021-12-11 LAB — CBC: RBC: 4.51 (ref 3.87–5.11)

## 2021-12-11 MED ORDER — LOSARTAN POTASSIUM 50 MG PO TABS
75.0000 mg | ORAL_TABLET | Freq: Every day | ORAL | 0 refills | Status: DC
Start: 2021-12-11 — End: 2022-01-22

## 2021-12-11 NOTE — Progress Notes (Signed)
? ?[facility]  ? ?POS: clinic ? ?Provider:  ?Cindi Carbon, ANP ?[place] ?[phone] ? ? ? ?Goals of Care:  ? ?  12/08/2021  ? 11:26 AM  ?Advanced Directives  ?Does Patient Have a Medical Advance Directive? Yes  ?Type of Advance Directive Living will;Healthcare Power of Attorney  ? ? ? ?Chief Complaint  ?Patient presents with  ? Acute Visit  ?  Confusion, htn  ? ? ?HPI: Patient is a 88 y.o. female seen today for an acute visit for confusion and HTN. ? ?Went to the ER 5/12 for HTN. BP 170s. No changes were made. ?She fell 5/13 but doesn't remember how. She fell on her right knee with some bruising and other issues. There was urine on the floor when they got there. Has a hx of urinary leakage but now its worse. Her daughter says she is more confused. Seems to be forgetful of the details of her. She has some hx of short term memory loss but now its worse. She is still driving.  ?Reports she has quit drinking beer. Daughters says there are several bottles of wine 1/2 full in the fridge.  ?No appetite. Has hoarse voice. No sore throat. Some soreness from the fall. Cough is present with some phlegm. She has had the cough for extended period that she can not quantify. Does have a hx of cough variant asthma, never smoker.  ?Hx of diastolic CHF. Has chronic SOB with exertion with  longer distances. Thought to be due to obesity and deconditioning. No chest pain. No sob at rest.  ?BP in clinic with nurse over 200 last week. 152/82 today.  ?Also she says she took two tablets of her sleeping pill rather than one which was prescribed. ? ? ?Past Medical History:  ?Diagnosis Date  ? Arthritis   ? Cancer [facility])   ? basal cell skin cancer  ? Concussion   ? Dyspnea   ? upon exertion  ? Hypertension   ? Hypothyroidism   ? Stress incontinence   ? Total knee replacement status, bilateral   ? ? ?Past Surgical History:  ?Procedure Laterality Date  ? ABDOMINAL HYSTERECTOMY    ? APPENDECTOMY    ? CARPAL TUNNEL RELEASE    ? rt  x2  ? COLONOSCOPY    ? EYE SURGERY Bilateral   ? cataract surgery with lens implant  ? FINGER AMPUTATION Right   ? little finger  ? I & D EXTREMITY  03/11/2012  ? Procedure: MINOR IRRIGATION AND DEBRIDEMENT EXTREMITY;  Surgeon: Cammie Sickle., MD;  Location: [facility];  Service: Orthopedics;  Laterality: Right;  Right little finger  ? KNEE ARTHROSCOPY    ? bilat   ? REPLACEMENT TOTAL KNEE BILATERAL    ? REVERSE SHOULDER ARTHROPLASTY Right 09/04/2018  ? Procedure: REVERSE SHOULDER ARTHROPLASTY;  Surgeon: Tania Ade, MD;  Location: [facility];  Service: Orthopedics;  Laterality: Right;  ? TONSILLECTOMY    ? TOTAL KNEE REVISION Right 03/14/2015  ? Procedure: TIBIAL REVISION, OPEN REDUCTION INTERNAL FIXATION PROXIMAL TIBIA;  Surgeon: Dorna Leitz, MD;  Location: [facility];  Service: Orthopedics;  Laterality: Right;  ? TOTAL KNEE REVISION Left 07/01/2020  ? Procedure: TOTAL KNEE REVISION;  Surgeon: Dorna Leitz, MD;  Location: [facility];  Service: Orthopedics;  Laterality: Left;  ? TRIGGER FINGER RELEASE    ? TUBAL LIGATION    ? ? ?Allergies  ?Allergen Reactions  ? Phenergan [Promethazine Hcl] Other (See Comments)  ?  hallucinations  ? Tizanidine   ?  delirium  ? Lisinopril Cough  ? ? ?Outpatient Encounter Medications as of 12/11/2021  ?Medication Sig  ? aspirin EC 81 MG tablet Take 81 mg by mouth daily.  ? Calcium Carb-Cholecalciferol (CALCIUM 600/VITAMIN D3 PO) Take 1 tablet by mouth daily.  ? Cholecalciferol (VITAMIN D-3) 1000 units CAPS Take 1,000 Units by mouth daily.   ? ferrous sulfate 325 (65 FE) MG tablet Take 325 mg by mouth daily with breakfast.   ? folic acid (FOLVITE) 130 MCG tablet Take 800 mcg by mouth daily.   ? levothyroxine (SYNTHROID) 125 MCG tablet Take 1 tablet (125 mcg total) by mouth daily before breakfast.  ? lidocaine 4 % Apply topically as needed.  ? losartan (COZAAR) 100 MG tablet TAKE 1/2 TABLET BY MOUTH DAILY  ? metoprolol tartrate (LOPRESSOR) 25 MG tablet TAKE 1 TABLET (25 MG TOTAL)  BY MOUTH 2 (TWO) TIMES DAILY WITH A MEAL.  ? pravastatin (PRAVACHOL) 80 MG tablet TAKE 1 TABLET BY MOUTH EVERY DAY  ? traZODone (DESYREL) 50 MG tablet TAKE 1 TABLET BY MOUTH EVERYDAY AT BEDTIME  ? vitamin C (ASCORBIC ACID) 500 MG tablet Take 500 mg by mouth daily.  ? ?No facility-administered encounter medications on file as of 12/11/2021.  ? ? ?Review of Systems:  ?Review of Systems  ?Constitutional:  Positive for appetite change and fatigue. Negative for activity change, chills, diaphoresis, fever and unexpected weight change.  ?HENT:  Negative for congestion, sinus pressure, sore throat and trouble swallowing.   ?Respiratory:  Positive for cough. Negative for shortness of breath and wheezing.   ?Cardiovascular:  Negative for chest pain, palpitations and leg swelling.  ?Gastrointestinal:  Negative for abdominal distention, abdominal pain, constipation and diarrhea.  ?Genitourinary:  Negative for decreased urine volume, difficulty urinating, dysuria, flank pain, frequency, hematuria, pelvic pain, urgency and vaginal discharge.  ?     Increase incontinence  ?Musculoskeletal:  Positive for gait problem. Negative for arthralgias, back pain, joint swelling and myalgias.  ?Neurological:  Negative for dizziness, tremors, seizures, syncope, facial asymmetry, speech difficulty, weakness, light-headedness, numbness and headaches.  ?Psychiatric/Behavioral:  Positive for confusion. Negative for agitation, behavioral problems and sleep disturbance. The patient is not nervous/anxious.   ? ?Health Maintenance  ?Topic Date Due  ? COVID-19 Vaccine (4 - Booster for Moderna series) 03/10/2021  ? Pneumonia Vaccine 36+ Years old (58) 10/29/2022 (Originally 02/28/2016)  ? INFLUENZA VACCINE  02/27/2022  ? TETANUS/TDAP  02/26/2030  ? DEXA SCAN  Completed  ? Zoster Vaccines- Shingrix  Completed  ? HPV VACCINES  Aged Out  ? ? ?Physical Exam: ?Vitals:  ? 12/11/21 1306  ?BP: (!) 152/82  ?Pulse: (!) 59  ?Resp: 20  ?Temp: 98.2 ?F (36.8 ?C)  ?SpO2:  91%  ?Weight: 267 lb 9.6 oz (121.4 kg)  ? ?Body mass index is 45.93 kg/m?Marland Kitchen ?Physical Exam ?Vitals and nursing note reviewed.  ?Constitutional:   ?   General: She is not in acute distress. ?   Appearance: She is not diaphoretic.  ?HENT:  ?   Head: Normocephalic and atraumatic.  ?   Right Ear: Tympanic membrane normal.  ?   Left Ear: Tympanic membrane normal.  ?   Nose: Nose normal. No congestion.  ?   Mouth/Throat:  ?   Mouth: Mucous membranes are moist.  ?   Pharynx: Oropharynx is clear.  ?Eyes:  ?   General:     ?   Right eye: No discharge.     ?  Left eye: No discharge.  ?   Extraocular Movements: Extraocular movements intact.  ?   Conjunctiva/sclera: Conjunctivae normal.  ?   Pupils: Pupils are equal, round, and reactive to light.  ?Neck:  ?   Vascular: No JVD.  ?Cardiovascular:  ?   Rate and Rhythm: Normal rate and regular rhythm.  ?   Heart sounds: No murmur heard. ?Pulmonary:  ?   Effort: Pulmonary effort is normal. No respiratory distress.  ?   Breath sounds: Normal breath sounds. No wheezing.  ?Abdominal:  ?   General: Bowel sounds are normal. There is no distension.  ?   Palpations: Abdomen is soft.  ?   Tenderness: There is no abdominal tenderness. There is no right CVA tenderness or left CVA tenderness.  ?Musculoskeletal:     ?   General: No swelling, tenderness, deformity or signs of injury.  ?   Cervical back: No rigidity or tenderness.  ?   Comments: Trace pitting edema BLE  ?Lymphadenopathy:  ?   Cervical: No cervical adenopathy.  ?Skin: ?   General: Skin is warm and dry.  ?Neurological:  ?   General: No focal deficit present.  ?   Mental Status: She is alert and oriented to person, place, and time. Mental status is at baseline.  ?   Cranial Nerves: No cranial nerve deficit.  ?   Motor: No weakness.  ?   Coordination: Coordination normal.  ?   Gait: Gait normal.  ?   Comments: Slight slurring of speech  ?Psychiatric:     ?   Mood and Affect: Mood normal.  ? ? ?Labs reviewed: ?Basic Metabolic  Panel: ?Recent Labs  ?  04/06/21 ?0000 10/31/21 ?0000  ?NA 139 140  ?K 4.5 4.6  ?CL 101 102  ?CO2 28* 28*  ?BUN 16 15  ?CREATININE 0.9 0.9  ?CALCIUM 9.6 9.5  ?TSH 0.46  --   ? ?Liver Function Tests: ?Recent

## 2021-12-14 ENCOUNTER — Non-Acute Institutional Stay (SKILLED_NURSING_FACILITY): Payer: Medicare Other | Admitting: Adult Health

## 2021-12-14 ENCOUNTER — Encounter: Payer: Self-pay | Admitting: Adult Health

## 2021-12-14 DIAGNOSIS — I10 Essential (primary) hypertension: Secondary | ICD-10-CM

## 2021-12-14 DIAGNOSIS — I5032 Chronic diastolic (congestive) heart failure: Secondary | ICD-10-CM

## 2021-12-14 DIAGNOSIS — E7800 Pure hypercholesterolemia, unspecified: Secondary | ICD-10-CM

## 2021-12-14 DIAGNOSIS — Z66 Do not resuscitate: Secondary | ICD-10-CM | POA: Diagnosis not present

## 2021-12-14 DIAGNOSIS — U071 COVID-19: Secondary | ICD-10-CM | POA: Diagnosis not present

## 2021-12-14 DIAGNOSIS — E039 Hypothyroidism, unspecified: Secondary | ICD-10-CM | POA: Diagnosis not present

## 2021-12-14 DIAGNOSIS — R410 Disorientation, unspecified: Secondary | ICD-10-CM | POA: Diagnosis not present

## 2021-12-14 DIAGNOSIS — N3945 Continuous leakage: Secondary | ICD-10-CM

## 2021-12-14 DIAGNOSIS — J45991 Cough variant asthma: Secondary | ICD-10-CM

## 2021-12-14 NOTE — Progress Notes (Signed)
Location:  [facility] Room Number: 153-A Place of Service:  SNF [phone]) Provider:  Royal Hawthorn, NP   Patient Care Team: Virgie Dad, MD as PCP - General (Internal Medicine) Nahser, Wonda Cheng, MD as PCP - Cardiology (Cardiology) Tania Ade, MD as Consulting Physician (Orthopedic Surgery) Nahser, Wonda Cheng, MD as Consulting Physician (Cardiology) Darleen Crocker, MD as Consulting Physician (Ophthalmology) Irine Seal, MD as Attending Physician (Urology) Dorna Leitz, MD as Consulting Physician (Orthopedic Surgery) Tanda Rockers, MD as Consulting Physician (Pulmonary Disease)  Extended Emergency Contact Information Primary Emergency Contact: [name],[name] Address: [address]          [place], [place] [address] [place] Phone: [phone] Mobile Phone: [phone] Relation: Daughter Secondary Emergency Contact: Hipolito Bayley Address: [place], [place] [address] [place] Phone: [phone] Mobile Phone: [phone] Relation: Son  Code Status:  DNR  Goals of care: Advanced Directive information    12/08/2021   11:26 AM  Advanced Directives  Does Patient Have a Medical Advance Directive? Yes  Type of Advance Directive Living will;Healthcare Power of Attorney     Chief Complaint  Patient presents with   Follow-up    Follow-up from covid infection     HPI:  Pt is a 88 y.o. female seen today for an acute visit for follow up regarding a covid infection.  PMH significant for HTN, obesity, hypothyroidism, knee replacement, incontinence, hearing loss, HLD, macular degeneration, transient global amnesia.   She was seen in the clinic on 5/15 for uncontrolled HTN with falls, cough, confusion.  Rapid covid returned positive. CXR neg for acute process. CBC WNL except WBC 3.0.  BMP ok. BNP normal. UA 20,000 colonies no acute infection noted.  She was admitted to rehab for  further monitoring. She has cleared mentally. She is eating and drinking well. Has a congested cough. SOB on exertion unchanged from baseline. She had some fatigue and weakness but this has improved. Has lost her sense of smell. No fever. She is on isolation for 10 days due to being in skilled care. She is requesting to go home as she is independent in ADLs.  BP had been elevated 150-170s, losartan increase to 75 mg now 129/82.   Past Medical History:  Diagnosis Date   Arthritis    Cancer ([facility])    basal cell skin cancer   Concussion    Dyspnea    upon exertion   Hypertension    Hypothyroidism    Stress incontinence    Total knee replacement status, bilateral    Past Surgical History:  Procedure Laterality Date   ABDOMINAL HYSTERECTOMY     APPENDECTOMY     CARPAL TUNNEL RELEASE     rt x2   COLONOSCOPY     EYE SURGERY Bilateral    cataract surgery with lens implant   FINGER AMPUTATION Right    little finger   I & D EXTREMITY  03/11/2012   Procedure: MINOR IRRIGATION AND DEBRIDEMENT EXTREMITY;  Surgeon: Cammie Sickle., MD;  Location: [facility];  Service: Orthopedics;  Laterality: Right;  Right little finger   KNEE ARTHROSCOPY     bilat    REPLACEMENT TOTAL KNEE BILATERAL     REVERSE SHOULDER ARTHROPLASTY Right 09/04/2018   Procedure: REVERSE SHOULDER ARTHROPLASTY;  Surgeon: Tania Ade, MD;  Location: [facility];  Service: Orthopedics;  Laterality: Right;   TONSILLECTOMY  TOTAL KNEE REVISION Right 03/14/2015   Procedure: TIBIAL REVISION, OPEN REDUCTION INTERNAL FIXATION PROXIMAL TIBIA;  Surgeon: Dorna Leitz, MD;  Location: [facility];  Service: Orthopedics;  Laterality: Right;   TOTAL KNEE REVISION Left 07/01/2020   Procedure: TOTAL KNEE REVISION;  Surgeon: Dorna Leitz, MD;  [facility];  Service: Orthopedics;  Laterality: Left;   TRIGGER FINGER RELEASE     TUBAL LIGATION      Allergies  Allergen Reactions   Phenergan [Promethazine Hcl] Other (See  Comments)    hallucinations   Tizanidine     delirium   Lisinopril Cough    Outpatient Encounter Medications as of 12/14/2021  Medication Sig   aspirin EC 81 MG tablet Take 81 mg by mouth daily.   Calcium Carb-Cholecalciferol (CALCIUM 600/VITAMIN D3 PO) Take 1 tablet by mouth daily.   Cholecalciferol (VITAMIN D-3) 1000 units CAPS Take 1,000 Units by mouth daily.    ferrous sulfate 325 (65 FE) MG tablet Take 325 mg by mouth daily with breakfast.    folic acid (FOLVITE) 284 MCG tablet Take 800 mcg by mouth daily.    guaiFENesin (ROBITUSSIN) 100 MG/5ML liquid Take 10 mLs by mouth every 6 (six) hours as needed for cough or to loosen phlegm.   levothyroxine (SYNTHROID) 125 MCG tablet Take 1 tablet (125 mcg total) by mouth daily before breakfast.   lidocaine (LMX) 4 % cream Apply 1 application. topically as needed (Thin layer).   losartan (COZAAR) 50 MG tablet Take 1.5 tablets (75 mg total) by mouth daily.   metoprolol tartrate (LOPRESSOR) 25 MG tablet TAKE 1 TABLET (25 MG TOTAL) BY MOUTH 2 (TWO) TIMES DAILY WITH A MEAL.   Multiple Vitamins-Minerals (PRESERVISION AREDS PO) Take 1 capsule by mouth in the morning and at bedtime.   Nirmatrelvir-Ritonavir (PAXLOVID, 150/100, PO) Take 1 tablet by mouth 2 (two) times daily.   traZODone (DESYREL) 50 MG tablet TAKE 1 TABLET BY MOUTH EVERYDAY AT BEDTIME   vitamin C (ASCORBIC ACID) 500 MG tablet Take 500 mg by mouth daily.   pravastatin (PRAVACHOL) 80 MG tablet TAKE 1 TABLET BY MOUTH EVERY DAY (Patient not taking: Reported on 12/14/2021)   [DISCONTINUED] lidocaine 4 % Apply topically as needed.   No facility-administered encounter medications on file as of 12/14/2021.    Review of Systems  Constitutional:  Negative for activity change, appetite change, chills, diaphoresis, fatigue, fever and unexpected weight change.  HENT:  Positive for congestion and hearing loss.        Loss of smell  Respiratory:  Positive for cough. Negative for shortness of  breath and wheezing.   Cardiovascular:  Negative for chest pain, palpitations and leg swelling.  Gastrointestinal:  Negative for abdominal distention, abdominal pain, constipation and diarrhea.  Genitourinary:  Negative for decreased urine volume, difficulty urinating, dysuria, flank pain, frequency, pelvic pain and urgency.  Musculoskeletal:  Negative for arthralgias, back pain, gait problem, joint swelling and myalgias.  Neurological:  Negative for dizziness, tremors, seizures, syncope, facial asymmetry, speech difficulty, weakness, light-headedness, numbness and headaches.  Psychiatric/Behavioral:  Negative for agitation, behavioral problems and confusion.    Immunization History  Administered Date(s) Administered   Fluad Quad(high Dose 65+) 05/02/2021   Influenza Whole 03/30/2016   Influenza, High Dose Seasonal PF 04/17/2018, 03/28/2019, 05/20/2020   Moderna SARS-COV2 Booster Vaccination 01/13/2021   Moderna Sars-Covid-2 Vaccination 08/11/2019, 09/08/2019, 06/14/2020   Pneumococcal Conjugate-13 02/28/2015   Pneumococcal Polysaccharide-23 07/30/1996   Td 02/28/2011   Tdap 02/27/2020   Zoster Recombinat (Shingrix)  03/31/2019, 06/12/2019   Zoster, Live 08/30/2006   Pertinent  Health Maintenance Due  Topic Date Due   INFLUENZA VACCINE  02/27/2022   DEXA SCAN  Completed      12/05/2020   10:25 AM 05/01/2021   12:51 PM 11/10/2021    2:41 PM 11/20/2021    1:23 PM 12/08/2021   11:26 AM  Fall Risk  Falls in the past year? 0 0 0 0   Was there an injury with Fall?   0 0   Fall Risk Category Calculator   0 0   Fall Risk Category   Low Low   Patient Fall Risk Level  Low fall risk Low fall risk Low fall risk Low fall risk  Patient at Risk for Falls Due to   No Fall Risks Impaired mobility   Fall risk Follow up  Falls evaluation completed Falls evaluation completed Falls evaluation completed    Functional Status Survey:    Vitals:   12/14/21 1206  BP: 129/82  Pulse: 67  Resp: 18   Temp: 97.7 F (36.5 C)  SpO2: 96%  Weight: 264 lb (119.7 kg)  Height: '5\' 4"'$  (1.626 m)   Body mass index is 45.32 kg/m. Physical Exam Vitals and nursing note reviewed.  Constitutional:      General: She is not in acute distress.    Appearance: She is not diaphoretic.  HENT:     Head: Normocephalic and atraumatic.     Mouth/Throat:     Mouth: Mucous membranes are moist.     Pharynx: Oropharynx is clear.  Neck:     Vascular: No JVD.  Cardiovascular:     Rate and Rhythm: Normal rate and regular rhythm.     Heart sounds: No murmur heard. Pulmonary:     Effort: Pulmonary effort is normal. No respiratory distress.     Breath sounds: Normal breath sounds. No wheezing.  Abdominal:     General: Bowel sounds are normal. There is no distension.     Palpations: Abdomen is soft.     Tenderness: There is no abdominal tenderness.  Musculoskeletal:     Right lower leg: No edema.     Left lower leg: No edema.  Skin:    General: Skin is warm and dry.  Neurological:     Mental Status: She is alert and oriented to person, place, and time.  Psychiatric:        Mood and Affect: Mood normal.    Labs reviewed: Recent Labs    04/06/21 0000 10/31/21 0000 12/11/21 0000  NA 139 140 132*  K 4.5 4.6 4.3  CL 101 102 93*  CO2 28* 28* 29*  BUN '16 15 13  '$ CREATININE 0.9 0.9 1.0  CALCIUM 9.6 9.5 9.2   Recent Labs    04/06/21 0000 12/11/21 0000  AST 24 36*  ALT 15 19  ALKPHOS 78 57  ALBUMIN 4.1 3.9   Recent Labs    04/06/21 0000 12/11/21 0000  WBC 4.4 3.0  HGB 12.9 13.0  HCT 38 39  PLT 215 209   Lab Results  Component Value Date   TSH 0.46 04/06/2021   Lab Results  Component Value Date   HGBA1C 5.5 06/16/2014   Lab Results  Component Value Date   CHOL 155 04/06/2021   HDL 57 04/06/2021   LDLCALC 80 04/06/2021   TRIG 93 04/06/2021   CHOLHDL 4.0 05/21/2017    Significant Diagnostic Results in last 30 days:  No results found.  Assessment/Plan  1. Do not  resuscitate Ordered updated.  - Do not attempt resuscitation (DNR)  2. COVID-19 virus infection ON day 2/5 of paxlovid Much improved OK to discharge home in am. Will need to mask for 5 additional days.  F/U 4 weeks  3. Confusion Resolved Will or ST for cognition to eval for underlying memory problems   4. Cough variant asthma Ordered robitussin NO DM 10 ml q 6 prn  5. Acquired hypothyroidism Lab Results  Component Value Date   TSH 0.46 04/06/2021   On synthroid  6. Essential hypertension Improved with losartan 75 mg qd  7. Chronic diastolic congestive heart failure (HCC) BNP and CXR were normal  8. Morbid (severe) obesity due to excess calories ([place]) Encourage reduced portions Weight loss Long term issue.   9. Pure hypercholesterolemia Lab Results  Component Value Date   LDLCALC 80 04/06/2021  Statin on hold while on paxovid.    10. Continuous leakage of urine Long term issue, manages on her own with pads  11. Anemia Reduce iron to M W F CBC prior to next  apt    Family/ staff Communication: discussed with Ms. Pitkin and her daughter Karyl Kinnier ordered:  NA  Discharge review, assessment, plan, and coordination took >30 min

## 2021-12-29 DIAGNOSIS — H353132 Nonexudative age-related macular degeneration, bilateral, intermediate dry stage: Secondary | ICD-10-CM | POA: Diagnosis not present

## 2022-01-04 DIAGNOSIS — U071 COVID-19: Secondary | ICD-10-CM | POA: Diagnosis not present

## 2022-01-04 DIAGNOSIS — I5032 Chronic diastolic (congestive) heart failure: Secondary | ICD-10-CM | POA: Diagnosis not present

## 2022-01-04 DIAGNOSIS — Z789 Other specified health status: Secondary | ICD-10-CM | POA: Diagnosis not present

## 2022-01-08 DIAGNOSIS — U071 COVID-19: Secondary | ICD-10-CM | POA: Diagnosis not present

## 2022-01-08 DIAGNOSIS — Z789 Other specified health status: Secondary | ICD-10-CM | POA: Diagnosis not present

## 2022-01-08 DIAGNOSIS — I5032 Chronic diastolic (congestive) heart failure: Secondary | ICD-10-CM | POA: Diagnosis not present

## 2022-01-09 ENCOUNTER — Encounter: Payer: Self-pay | Admitting: Internal Medicine

## 2022-01-10 ENCOUNTER — Encounter: Payer: Self-pay | Admitting: Internal Medicine

## 2022-01-10 ENCOUNTER — Non-Acute Institutional Stay (INDEPENDENT_AMBULATORY_CARE_PROVIDER_SITE_OTHER): Payer: Self-pay | Admitting: Internal Medicine

## 2022-01-10 VITALS — Ht 64.0 in

## 2022-01-10 DIAGNOSIS — Z91199 Patient's noncompliance with other medical treatment and regimen due to unspecified reason: Secondary | ICD-10-CM

## 2022-01-12 ENCOUNTER — Ambulatory Visit: Payer: Medicare Other | Admitting: Nurse Practitioner

## 2022-01-12 ENCOUNTER — Encounter: Payer: Self-pay | Admitting: Nurse Practitioner

## 2022-01-12 VITALS — BP 140/90 | HR 72 | Temp 97.4°F | Resp 18 | Ht 64.0 in | Wt 273.0 lb

## 2022-01-12 DIAGNOSIS — J069 Acute upper respiratory infection, unspecified: Secondary | ICD-10-CM

## 2022-01-12 NOTE — Progress Notes (Signed)
Careteam: Patient Care Team: Virgie Dad, MD as PCP - General (Internal Medicine) Nahser, Wonda Cheng, MD as PCP - Cardiology (Cardiology) Tania Ade, MD as Consulting Physician (Orthopedic Surgery) Nahser, Wonda Cheng, MD as Consulting Physician (Cardiology) Darleen Crocker, MD as Consulting Physician (Ophthalmology) Irine Seal, MD as Attending Physician (Urology) Dorna Leitz, MD as Consulting Physician (Orthopedic Surgery) Tanda Rockers, MD as Consulting Physician (Pulmonary Disease)  PLACE OF SERVICE:  [facility]  Advanced Directive information Does Patient Have a Medical Advance Directive?: Yes, Type of Advance Directive: Living will;Out of facility DNR (pink MOST or yellow form), Pre-existing out of facility DNR order (yellow form or pink MOST form): Yellow form placed in chart (order not valid for inpatient use), Does patient want to make changes to medical advance directive?: No - Patient declined  Allergies  Allergen Reactions   Phenergan [Promethazine Hcl] Other (See Comments)    hallucinations   Tizanidine     delirium   Lisinopril Cough    Chief Complaint  Patient presents with   Acute Visit    Patient complains of sinus headache for 1 week & 1/2. Patient tested Positive for Covid end of May 2023.     HPI: Patient is a 88 y.o. female due to sinus headache.   She was found positive for COVID while in rehab at [facility]- 12/11/21, reports she is still feeling fatigue from this. Reports she did not have any symptoms of COVID. Completed paxlovid.  Reports for the last and a half she has had a cough and sinus headache. Hurts to cough.  No fever.  Has not taken any medication. Overall she is feeling better.    Review of Systems:  Review of Systems  Constitutional:  Positive for malaise/fatigue. Negative for chills and fever.  HENT:  Positive for hearing loss and sinus pain. Negative for congestion and sore throat.   Respiratory:  Positive for cough.  Negative for sputum production and shortness of breath.   Cardiovascular:  Negative for chest pain and palpitations.    Past Medical History:  Diagnosis Date   Arthritis    Cancer ([place])    basal cell skin cancer   Concussion    Dyspnea    upon exertion   Hypertension    Hypothyroidism    Stress incontinence    Total knee replacement status, bilateral    Past Surgical History:  Procedure Laterality Date   ABDOMINAL HYSTERECTOMY     APPENDECTOMY     CARPAL TUNNEL RELEASE     rt x2   COLONOSCOPY     EYE SURGERY Bilateral    cataract surgery with lens implant   FINGER AMPUTATION Right    little finger   I & D EXTREMITY  03/11/2012   Procedure: MINOR IRRIGATION AND DEBRIDEMENT EXTREMITY;  Surgeon: Cammie Sickle., MD;  Location: [place];  Service: Orthopedics;  Laterality: Right;  Right little finger   KNEE ARTHROSCOPY     bilat    REPLACEMENT TOTAL KNEE BILATERAL     REVERSE SHOULDER ARTHROPLASTY Right 09/04/2018   Procedure: REVERSE SHOULDER ARTHROPLASTY;  Surgeon: Tania Ade, MD;  Location: [place];  Service: Orthopedics;  Laterality: Right;   TONSILLECTOMY     TOTAL KNEE REVISION Right 03/14/2015   Procedure: TIBIAL REVISION, OPEN REDUCTION INTERNAL FIXATION PROXIMAL TIBIA;  Surgeon: Dorna Leitz, MD;  Location: [place];  Service: Orthopedics;  Laterality: Right;   TOTAL KNEE REVISION Left 07/01/2020   Procedure: TOTAL  KNEE REVISION;  Surgeon: Dorna Leitz, MD;  Location: [facility];  Service: Orthopedics;  Laterality: Left;   TRIGGER FINGER RELEASE     TUBAL LIGATION     Social History:   reports that she has never smoked. She has never used smokeless tobacco. She reports current alcohol use of about 2.0 standard drinks of alcohol per week. She reports that she does not use drugs.  Family History  Problem Relation Age of Onset   Bladder Cancer Father     Medications: Patient's Medications  New Prescriptions   No medications on file  Previous  Medications   ASPIRIN EC 81 MG TABLET    Take 81 mg by mouth daily.   CALCIUM CARB-CHOLECALCIFEROL (CALCIUM 600/VITAMIN D3 PO)    Take 1 tablet by mouth daily.   CHOLECALCIFEROL (VITAMIN D-3) 1000 UNITS CAPS    Take 1,000 Units by mouth daily.    FERROUS SULFATE 325 (65 FE) MG TABLET    Take 325 mg by mouth 3 (three) times a week.   FOLIC ACID (FOLVITE) 102 MCG TABLET    Take 800 mcg by mouth daily.    GUAIFENESIN (ROBITUSSIN) 100 MG/5ML LIQUID    Take 10 mLs by mouth every 6 (six) hours as needed for cough or to loosen phlegm.   LEVOTHYROXINE (SYNTHROID) 125 MCG TABLET    Take 1 tablet (125 mcg total) by mouth daily before breakfast.   LIDOCAINE (LMX) 4 % CREAM    Apply 1 application. topically as needed (Thin layer).   LOSARTAN (COZAAR) 50 MG TABLET    Take 1.5 tablets (75 mg total) by mouth daily.   METOPROLOL TARTRATE (LOPRESSOR) 25 MG TABLET    TAKE 1 TABLET (25 MG TOTAL) BY MOUTH 2 (TWO) TIMES DAILY WITH A MEAL.   MULTIPLE VITAMINS-MINERALS (PRESERVISION AREDS PO)    Take 1 capsule by mouth in the morning and at bedtime.   NIRMATRELVIR-RITONAVIR (PAXLOVID, 150/100, PO)    Take 1 tablet by mouth 2 (two) times daily.   PRAVASTATIN (PRAVACHOL) 80 MG TABLET    TAKE 1 TABLET BY MOUTH EVERY DAY   TRAZODONE (DESYREL) 50 MG TABLET    TAKE 1 TABLET BY MOUTH EVERYDAY AT BEDTIME   VITAMIN C (ASCORBIC ACID) 500 MG TABLET    Take 500 mg by mouth daily.  Modified Medications   No medications on file  Discontinued Medications   No medications on file    Physical Exam:  Vitals:   01/12/22 1404  BP: 140/90  Pulse: 72  Resp: 18  Temp: (!) 97.4 F (36.3 C)  SpO2: 97%  Weight: 273 lb (123.8 kg)  Height: '5\' 4"'$  (1.626 m)   Body mass index is 46.86 kg/m. Wt Readings from Last 3 Encounters:  01/12/22 273 lb (123.8 kg)  12/14/21 264 lb (119.7 kg)  12/11/21 267 lb 9.6 oz (121.4 kg)    Physical Exam Constitutional:      General: She is not in acute distress.    Appearance: She is  well-developed. She is not diaphoretic.  HENT:     Head: Normocephalic and atraumatic.     Right Ear: Tympanic membrane, ear canal and external ear normal.     Left Ear: Tympanic membrane, ear canal and external ear normal.     Nose: Congestion and rhinorrhea present.     Mouth/Throat:     Mouth: Mucous membranes are moist.     Pharynx: No oropharyngeal exudate.  Eyes:     Conjunctiva/sclera: Conjunctivae normal.  Pupils: Pupils are equal, round, and reactive to light.  Cardiovascular:     Rate and Rhythm: Normal rate and regular rhythm.     Heart sounds: Normal heart sounds.  Pulmonary:     Effort: Pulmonary effort is normal.     Breath sounds: Normal breath sounds.  Abdominal:     General: Bowel sounds are normal.     Palpations: Abdomen is soft.  Musculoskeletal:     Cervical back: Normal range of motion and neck supple.     Right lower leg: No edema.     Left lower leg: No edema.  Skin:    General: Skin is warm and dry.  Neurological:     Mental Status: She is alert.  Psychiatric:        Mood and Affect: Mood normal.     Labs reviewed: Basic Metabolic Panel: Recent Labs    04/06/21 0000 10/31/21 0000 12/11/21 0000  NA 139 140 132*  K 4.5 4.6 4.3  CL 101 102 93*  CO2 28* 28* 29*  BUN '16 15 13  '$ CREATININE 0.9 0.9 1.0  CALCIUM 9.6 9.5 9.2  TSH 0.46  --   --    Liver Function Tests: Recent Labs    04/06/21 0000 12/11/21 0000  AST 24 36*  ALT 15 19  ALKPHOS 78 57  ALBUMIN 4.1 3.9   No results for input(s): "LIPASE", "AMYLASE" in the last 8760 hours. No results for input(s): "AMMONIA" in the last 8760 hours. CBC: Recent Labs    04/06/21 0000 12/11/21 0000  WBC 4.4 3.0  HGB 12.9 13.0  HCT 38 39  PLT 215 209   Lipid Panel: Recent Labs    04/06/21 0000  CHOL 155  HDL 57  LDLCALC 80  TRIG 93   TSH: Recent Labs    04/06/21 0000  TSH 0.46   A1C: Lab Results  Component Value Date   HGBA1C 5.5 06/16/2014     Assessment/Plan 1.  Viral upper respiratory tract infection Overall improving. Continue supportive care.  Increase vit c to twice daily  To use OTC guaifenesin medication as needed Increase water intake- maintain good hydration Nasal wash 1-2 times daily   Notify if symptoms worsen  Samantha Stone K. [place], [facility] [phone]

## 2022-01-12 NOTE — Progress Notes (Signed)
No SHow

## 2022-01-12 NOTE — Patient Instructions (Signed)
Increase vit c to twice daily  Continue with cough medication as needed Increase water Nasal wash 1-2 times daily   Notify if symptoms worsen

## 2022-01-21 ENCOUNTER — Encounter: Payer: Self-pay | Admitting: Cardiovascular Disease

## 2022-01-21 NOTE — Progress Notes (Signed)
Cardiology Office Note   Date:  01/22/2022   ID:  Samantha Stone, Samantha Stone October 08, 1930, MRN [id number]  PCP:  Mahlon Gammon, MD  Cardiologist:   Kristeen Miss, MD   Chief Complaint  Patient presents with   Congestive Heart Failure        Hypertension        1. Hypertension 2. Possible TIAs  - mental status changes , difficulty talking.   3. Hypothyroidism 4. Hyperlipidemia 5.      Samantha Stone is an 88 yo who I have known for many years.  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    Samantha Stone has fallen several times over the past several months.  She slipped on the wet floor on the ceramic tile.    She presents for further management of her HTN.    Her BP has been irregular at home.   Her diet is fairly consistent.  She does not eat much salt.  She has frozen lasagna on occasion.  Hot dog once a month or so.    She was admitted to [facility] in March, 2015 for mental confusion and HTN Echo showed:  Left ventricle: The cavity size was normal. Wall thickness was at the upper limits of normal. Systolic function was normal. The estimated ejection fraction was in the range of 60% to 65%. Wall motion was normal; there were no regional wall motion abnormalities. Doppler parameters are consistent with abnormal left ventricular relaxation (grade 1 diastolic dysfunction). - Mitral valve: Mild regurgitation. - Left atrium: The atrium was moderately to severely dilated. - Pulmonary arteries: Systolic pressure was mildly increased. PA peak pressure: 48mm Hg    Oct. 20, 2015:  Samantha Stone is doing well.  She had her cataracts done.    No CP or dyspnea.    Feb. 19, 2016:   Samantha Stone is a 88 y.o. female who presents for her HTN and hyperlipidemia. Family issues,  - Gala Romney was placed in a nursing home.  Step daughter challenged.  Has had a rough 3-4 months.   Has not been exerciseing, diet has not been as strict recently.   Oct. 9, 2017:  Samantha Stone is seen today for follow up visit Doing  well Has had a chronic cough.   Was diagnosed with CHF - she had an echo in 2015 that showed normal LV systolic function and grade 1 diastolic dysfunction   Has signficant DOE  - gets DOE walking to her mailbox  She was started on Lasix 10 mg a day but has not noticed any increase in her urine output.  Jan. 24, 2018:  Still has a cough.    Is now on Lasix 60 mg a day  Has been to ENT  Going to see Dr. Sherene Sires soon .  BP has been elevated.   Oct. 23, 2018: Samantha Stone is seen today for follow up of her chronic diastolic congestive heart failure Still has a chronic cough.  Slightly better recently  Has low stamina  Doing better on Lasix 20 mg a day instead of 40 mg   June 10, 2018: Samantha Stone  is seen today for follow-up of her chronic diastolic congestive heart failure. She now lives at [facility]. Walks , wears her Fitbit.     Wt today is 264 lbs.  Gets fatigued  Had a cough for years but now it has resolved.  BP has been normal recently . Is a bit high today   July 07, 2019:  Samantha Stone  is seen today for follow-up visit.   Has some DOE walking from her car to her apt.  Admits that she is out of shape and weighs too much .   January 22, 2022: Samantha Stone is seen today for follow up of her chronic diastolic CHF Wt is 273 lbs  BP has been higher  Has gained weight  Not much exercise since May when she had COVID   Past Medical History:  Diagnosis Date   Arthritis    Cancer ([facility])    basal cell skin cancer   Concussion    Dyspnea    upon exertion   Hypertension    Hypothyroidism    Stress incontinence    Total knee replacement status, bilateral     Past Surgical History:  Procedure Laterality Date   ABDOMINAL HYSTERECTOMY     APPENDECTOMY     CARPAL TUNNEL RELEASE     rt x2   COLONOSCOPY     EYE SURGERY Bilateral    cataract surgery with lens implant   FINGER AMPUTATION Right    little finger   I & D EXTREMITY  03/11/2012   Procedure: MINOR IRRIGATION AND DEBRIDEMENT  EXTREMITY;  Surgeon: Wyn Forster., MD;  Location: [facility];  Service: Orthopedics;  Laterality: Right;  Right little finger   KNEE ARTHROSCOPY     bilat    REPLACEMENT TOTAL KNEE BILATERAL     REVERSE SHOULDER ARTHROPLASTY Right 09/04/2018   Procedure: REVERSE SHOULDER ARTHROPLASTY;  Surgeon: Jones Broom, MD;  Location: [facility];  Service: Orthopedics;  Laterality: Right;   TONSILLECTOMY     TOTAL KNEE REVISION Right 03/14/2015   Procedure: TIBIAL REVISION, OPEN REDUCTION INTERNAL FIXATION PROXIMAL TIBIA;  Surgeon: Jodi Geralds, MD;  Location: [facility];  Service: Orthopedics;  Laterality: Right;   TOTAL KNEE REVISION Left 07/01/2020   Procedure: TOTAL KNEE REVISION;  Surgeon: Jodi Geralds, MD;  Location: [facility];  Service: Orthopedics;  Laterality: Left;   TRIGGER FINGER RELEASE     TUBAL LIGATION       Current Outpatient Medications  Medication Sig Dispense Refill   aspirin EC 81 MG tablet Take 81 mg by mouth daily.     Calcium Carb-Cholecalciferol (CALCIUM 600/VITAMIN D3 PO) Take 1 tablet by mouth daily.     Cholecalciferol (VITAMIN D-3) 1000 units CAPS Take 1,000 Units by mouth daily.      ferrous sulfate 325 (65 FE) MG tablet Take 325 mg by mouth 3 (three) times a week.     folic acid (FOLVITE) 800 MCG tablet Take 800 mcg by mouth daily.      guaiFENesin (ROBITUSSIN) 100 MG/5ML liquid Take 10 mLs by mouth every 6 (six) hours as needed for cough or to loosen phlegm.     hydrochlorothiazide (HYDRODIURIL) 25 MG tablet Take 1 tablet (25 mg total) by mouth daily. 90 tablet 3   levothyroxine (SYNTHROID) 125 MCG tablet Take 1 tablet (125 mcg total) by mouth daily before breakfast. 90 tablet 1   lidocaine (LMX) 4 % cream Apply 1 application. topically as needed (Thin layer).     losartan (COZAAR) 100 MG tablet Take 1 tablet (100 mg total) by mouth daily. 90 tablet 3   metoprolol tartrate (LOPRESSOR) 25 MG tablet TAKE 1 TABLET (25 MG TOTAL) BY MOUTH 2 (TWO) TIMES DAILY WITH A  MEAL. 180 tablet 2   Multiple Vitamins-Minerals (PRESERVISION AREDS PO) Take 1 capsule by mouth in the morning and at bedtime.  Nirmatrelvir-Ritonavir (PAXLOVID, 150/100, PO) Take 1 tablet by mouth 2 (two) times daily.     potassium chloride (KLOR-CON) 10 MEQ tablet Take 1 tablet (10 mEq total) by mouth daily. 90 tablet 3   pravastatin (PRAVACHOL) 80 MG tablet TAKE 1 TABLET BY MOUTH EVERY DAY 90 tablet 3   traZODone (DESYREL) 50 MG tablet TAKE 1 TABLET BY MOUTH EVERYDAY AT BEDTIME 90 tablet 1   vitamin C (ASCORBIC ACID) 500 MG tablet Take 500 mg by mouth daily.     No current facility-administered medications for this visit.    Allergies:   Phenergan [promethazine hcl], Tizanidine, and Lisinopril    Social History:  The patient  reports that she has never smoked. She has never used smokeless tobacco. She reports current alcohol use of about 2.0 standard drinks of alcohol per week. She reports that she does not use drugs.   Family History:  The patient's family history includes Bladder Cancer in her father.    ROS:  Please see the history of present illness.      Physical Exam: Blood pressure (!) 175/91, pulse 70, height 5\' 4"  (1.626 m), weight 273 lb 9.6 oz (124.1 kg), SpO2 97 %.  GEN:  morbidly obese female,  NAD  HEENT: Normal NECK: No JVD; No carotid bruits LYMPHATICS: No lymphadenopathy CARDIAC: RRR , no murmurs, rubs, gallops RESPIRATORY:  Clear to auscultation without rales, wheezing or rhonchi  ABDOMEN: Soft, non-tender, non-distended MUSCULOSKELETAL:  No edema; No deformity  SKIN: Warm and dry NEUROLOGIC:  Alert and oriented x 3    EKG:    January 22, 2022: Normal sinus rhythm at 70.  No ST or T wave changes   Recent Labs: 04/06/2021: TSH 0.46 12/11/2021: ALT 19; BUN 13; Creatinine 1.0; Hemoglobin 13.0; Platelets 209; Potassium 4.3; Sodium 132    Lipid Panel    Component Value Date/Time   CHOL 155 04/06/2021 0000   CHOL 191 05/21/2017 1128   TRIG 93 04/06/2021  0000   HDL 57 04/06/2021 0000   HDL 48 05/21/2017 1128   CHOLHDL 4.0 05/21/2017 1128   CHOLHDL 2.8 06/16/2014 0429   VLDL 12 06/16/2014 0429   LDLCALC 80 04/06/2021 0000   LDLCALC 105 (H) 05/21/2017 1128      Wt Readings from Last 3 Encounters:  01/22/22 273 lb 9.6 oz (124.1 kg)  01/12/22 273 lb (123.8 kg)  12/14/21 264 lb (119.7 kg)      Other studies Reviewed: Additional studies/ records that were reviewed today include: . Review of the above records demonstrates:    ASSESSMENT AND PLAN:  1. Hypertension -     add HCTZ 25 mg a day , kdur 10 a day  Advised weight loss , losartan to 100  BMP in 3 weeks.  Office visit with APP in 3 months    2. Possible TIAs  -   3. Hypothyroidism -    4. Hyperlipidemia -   stable   5.  DOE, cough / chronic diastolic CHF- Stable    Return in 1 year     Current medicines are reviewed at length with the patient today.  The patient does not have concerns regarding medicines.  The following changes have been made:  no change     Signed, Kristeen Miss, MD  01/22/2022 5:03 PM    [facility] [address] [place], [place], [place]  [address] Phone: [phone]; Fax: [phone]

## 2022-01-22 ENCOUNTER — Encounter: Payer: Self-pay | Admitting: Cardiovascular Disease

## 2022-01-22 ENCOUNTER — Ambulatory Visit: Payer: Medicare Other | Admitting: Cardiovascular Disease

## 2022-01-22 VITALS — BP 175/91 | HR 70 | Ht 64.0 in | Wt 273.6 lb

## 2022-01-22 DIAGNOSIS — Z79899 Other long term (current) drug therapy: Secondary | ICD-10-CM

## 2022-01-22 DIAGNOSIS — I10 Essential (primary) hypertension: Secondary | ICD-10-CM | POA: Diagnosis not present

## 2022-01-22 MED ORDER — POTASSIUM CHLORIDE ER 10 MEQ PO TBCR: 10.0000 meq | EXTENDED_RELEASE_TABLET | Freq: Every day | ORAL | 3 refills | Status: DC

## 2022-01-22 MED ORDER — HYDROCHLOROTHIAZIDE 25 MG PO TABS: 25.0000 mg | ORAL_TABLET | Freq: Every day | ORAL | 3 refills | Status: DC

## 2022-01-22 MED ORDER — LOSARTAN POTASSIUM 100 MG PO TABS: 100.0000 mg | ORAL_TABLET | Freq: Every day | ORAL | 3 refills | Status: DC

## 2022-01-25 ENCOUNTER — Other Ambulatory Visit: Payer: Self-pay | Admitting: Internal Medicine

## 2022-01-25 DIAGNOSIS — I10 Essential (primary) hypertension: Secondary | ICD-10-CM

## 2022-01-25 DIAGNOSIS — Z79899 Other long term (current) drug therapy: Secondary | ICD-10-CM

## 2022-04-20 NOTE — Progress Notes (Unsigned)
Cardiology Office Note:    Date:  04/24/2022   ID:  Samantha Stone, DOB 02-05-1931, MRN [id number]  PCP:  Virgie Dad, MD   [facility] Providers Cardiologist:  [name], MD      Referring MD: Virgie Dad, MD   Chief Complaint: follow-up hypertension  History of Present Illness:    Samantha Stone is a very pleasant  89 y.o. female with a hx of HTN, HLD, hypothyroidism, chronic HFpEF, obesity, and possible TIAs (episodes of mental status change and inability to speak).   Followed by Dr. Acie Fredrickson for many years for hypertension.  Echocardiogram 2017 revealed normal LV systolic function, grade 1 diastolic dysfunction, and mild pulmonary hypertension.  She was last seen in our office on 01/22/2022 by Dr. Acie Fredrickson at which time BP was elevated.  She was advised to increase losartan to 100 mg daily and start hydrochlorothiazide 25 mg daily and potassium 10 mEq daily. Recommendation for 3 month follow-up was recommended. BMET was ordered for 3 weeks after med changes but no record of completion.  Today, she is here alone. Is living at [place], reports the meals are all excellent and she admits that eats too much. BP at home and checked by [facility] nurse has been elevated.  She discontinued HCTZ due to frequent urination. Is not getting any exercise. Likes to do cross-stitching. Is having another great-grandson soon.    Past Medical History:  Diagnosis Date   Arthritis    Cancer ([place])    basal cell skin cancer   Concussion    Dyspnea    upon exertion   Hypertension    Hypothyroidism    Stress incontinence    Total knee replacement status, bilateral     Past Surgical History:  Procedure Laterality Date   ABDOMINAL HYSTERECTOMY     APPENDECTOMY     CARPAL TUNNEL RELEASE     rt x2   COLONOSCOPY     EYE SURGERY Bilateral    cataract surgery with lens implant   FINGER AMPUTATION Right    little finger   I & D EXTREMITY  03/11/2012   Procedure: MINOR  IRRIGATION AND DEBRIDEMENT EXTREMITY;  Surgeon: Cammie Sickle., MD;  Location: [facility];  Service: Orthopedics;  Laterality: Right;  Right little finger   KNEE ARTHROSCOPY     bilat    REPLACEMENT TOTAL KNEE BILATERAL     REVERSE SHOULDER ARTHROPLASTY Right 09/04/2018   Procedure: REVERSE SHOULDER ARTHROPLASTY;  Surgeon: Tania Ade, MD;  Location: [place];  Service: Orthopedics;  Laterality: Right;   TONSILLECTOMY     TOTAL KNEE REVISION Right 03/14/2015   Procedure: TIBIAL REVISION, OPEN REDUCTION INTERNAL FIXATION PROXIMAL TIBIA;  Surgeon: Dorna Leitz, MD;  Location: [place];  Service: Orthopedics;  Laterality: Right;   TOTAL KNEE REVISION Left 07/01/2020   Procedure: TOTAL KNEE REVISION;  Surgeon: Dorna Leitz, MD;  Location: [facility];  Service: Orthopedics;  Laterality: Left;   TRIGGER FINGER RELEASE     TUBAL LIGATION      Current Medications: Current Meds  Medication Sig   aspirin EC 81 MG tablet Take 81 mg by mouth daily.   Calcium Carb-Cholecalciferol (CALCIUM 600/VITAMIN D3 PO) Take 1 tablet by mouth daily.   Cholecalciferol (VITAMIN D-3) 1000 units CAPS Take 1,000 Units by mouth daily.    ferrous sulfate 325 (65 FE) MG tablet Take 325 mg by mouth 3 (three) times a week.   folic acid (FOLVITE) 606  MCG tablet Take 800 mcg by mouth daily.    levothyroxine (SYNTHROID) 125 MCG tablet Take 1 tablet (125 mcg total) by mouth daily before breakfast.   metoprolol tartrate (LOPRESSOR) 25 MG tablet TAKE 1 TABLET (25 MG TOTAL) BY MOUTH 2 (TWO) TIMES DAILY WITH A MEAL.   Multiple Vitamins-Minerals (PRESERVISION AREDS PO) Take 1 capsule by mouth in the morning and at bedtime.   Nirmatrelvir-Ritonavir (PAXLOVID, 150/100, PO) Take 1 tablet by mouth 2 (two) times daily.   pravastatin (PRAVACHOL) 80 MG tablet TAKE 1 TABLET BY MOUTH EVERY DAY   spironolactone (ALDACTONE) 25 MG tablet Take 0.5 tablets (12.5 mg total) by mouth daily.   traZODone (DESYREL) 50 MG tablet TAKE 1 TABLET  BY MOUTH EVERYDAY AT BEDTIME   valsartan (DIOVAN) 320 MG tablet Take 1 tablet (320 mg total) by mouth daily.   vitamin C (ASCORBIC ACID) 500 MG tablet Take 500 mg by mouth daily.   [DISCONTINUED] losartan (COZAAR) 100 MG tablet Take 1 tablet (100 mg total) by mouth daily.   [DISCONTINUED] potassium chloride (KLOR-CON) 10 MEQ tablet Take 1 tablet (10 mEq total) by mouth daily.     Allergies:   Phenergan [promethazine hcl], Tizanidine, and Lisinopril   Social History   Socioeconomic History   Marital status: Widowed    Spouse name: Not on file   Number of children: 4   Years of education: Not on file   Highest education level: Not on file  Occupational History   Occupation: Retired Therapist, sports  Tobacco Use   Smoking status: Never   Smokeless tobacco: Never  Vaping Use   Vaping Use: Never used  Substance and Sexual Activity   Alcohol use: Yes    Alcohol/week: 2.0 standard drinks of alcohol    Types: 2 Cans of beer per week    Comment: weekly   Drug use: No   Sexual activity: Not on file  Other Topics Concern   Not on file  Social History Narrative   Tobacco use, amount per day now: NEVER   Past tobacco use, amount per day:   How many years did you use tobacco:   Alcohol use (drinks per week): BEER COUPLE A DAY   Diet: REGULAR   Do you drink/eat things with caffeine: COFFEE ONCE A DAY   Marital status:   WIDOW                               What year were you married? 1ST TIME 1956 DIED 2ND 1987 DIED Oct 12, 2013   Do you live in a house, apartment, assisted living, condo, trailer, etc.? APARTMENT   Is it one or more stories? ONE   How many persons live in your home? ONE   Do you have pets in your home?( please list) Randall   Current or past profession: RN   Do you exercise?        NOT OFTEN ENOUGH                          Type and how often? BEFORE SHOULDER SURG, I WALKED AND SWAM   Do you have a living will? YES   Do you have a DNR form?   YES  If not, do you want to discuss one?   Do you have signed POA/HPOA forms?      YES                   If so, please bring to you appointment   Social Determinants of Health   Financial Resource Strain: Not on file  Food Insecurity: Not on file  Transportation Needs: Not on file  Physical Activity: Not on file  Stress: Not on file  Social Connections: Not on file     Family History: The patient's family history includes Bladder Cancer in her father.  ROS:   Please see the history of present illness.    +DOE All other systems reviewed and are negative.  Labs/Other Studies Reviewed:    The following studies were reviewed today:  Echo 05/2016  Left ventricle: The cavity size was normal. Wall thickness was    increased in a pattern of mild LVH. There was moderate focal    basal hypertrophy of the septum. Systolic function was normal.    The estimated ejection fraction was in the range of 60% to 65%.    Doppler parameters are consistent with abnormal left ventricular    relaxation (grade 1 diastolic dysfunction).  - Mitral valve: Calcified annulus. Mildly thickened leaflets .    There was mild regurgitation.  - Left atrium: The atrium was moderately dilated.  - Right atrium: The atrium was mildly dilated.  - Pulmonary arteries: PA peak pressure: 38 mm Hg (S).  - Impressions: There is moderate hypertrophy of the basilar septum    with a narrow LVOT but no significant outflow tract gradient or    SAM to suggest HOCM.    Recent Labs: 12/11/2021: ALT 19; BUN 13; Creatinine 1.0; Hemoglobin 13.0; Platelets 209; Potassium 4.3; Sodium 132  Recent Lipid Panel    Component Value Date/Time   CHOL 155 04/06/2021 0000   CHOL 191 05/21/2017 1128   TRIG 93 04/06/2021 0000   HDL 57 04/06/2021 0000   HDL 48 05/21/2017 1128   CHOLHDL 4.0 05/21/2017 1128   CHOLHDL 2.8 06/16/2014 0429   VLDL 12 06/16/2014 0429   LDLCALC 80 04/06/2021 0000   LDLCALC 105 (H) 05/21/2017 1128     Risk  Assessment/Calculations:       Physical Exam:    VS:  BP (!) 152/78   Pulse 73   Ht '5\' 4"'$  (1.626 m)   Wt 274 lb (124.3 kg)   SpO2 97%   BMI 47.03 kg/m     Wt Readings from Last 3 Encounters:  04/24/22 274 lb (124.3 kg)  01/22/22 273 lb 9.6 oz (124.1 kg)  01/12/22 273 lb (123.8 kg)     GEN: Well developed, obese in no acute distress HEENT: Normal NECK: No JVD; No carotid bruits CARDIAC: RRR, no murmurs, rubs, gallops RESPIRATORY:  Clear to auscultation without rales, wheezing or rhonchi  ABDOMEN: Soft, non-tender, non-distended MUSCULOSKELETAL:  No edema; No deformity. 2+ pedal pulses, equal bilaterally SKIN: Warm and dry NEUROLOGIC:  Alert and oriented x 3 PSYCHIATRIC:  Normal affect   EKG:  EKG is not ordered today.    HYPERTENSION CONTROL Vitals:   04/24/22 1317 04/24/22 1345  BP: (!) 180/80 (!) 152/78    The patient's blood pressure is elevated above target today.  In order to address the patient's elevated BP: A current anti-hypertensive medication was adjusted today.; A new medication was prescribed today.       Diagnoses:  1. Essential hypertension   2. Chronic heart failure with preserved ejection fraction (HFpEF) ([place])   3. DOE (dyspnea on exertion)    Assessment and Plan:     Hypertension: BP  remains markedly elevated. HCTZ was added at last office visit 01/22/22, however she d/ced due to frequency of urination. Admits to dietary indiscretion. Encouraged less sodium, smaller portion sizes. Creatinine stable at 1.0 on 12/11/21. We will discontinue losartan and start valsartan 320 mg once daily.  We will add spironolactone 12.5 mg daily.  We will check bmet when she returns for 2D echo in 2 weeks.   DOE/Chronic HFpEF: LVEF 60-65%, G1DD, mild LVH on echo 05/2016. She has DOE, no orthopnea, PND, or edema. Weight is stable per her report and by our scale. Volume status difficult to assess 2/2 body habitus. Continue ARB, metoprolol. Will add low dose  spironolactone.  Updating echocardiogram for evaluation of structural heart disease.    Disposition: 6 months with Dr. Acie Fredrickson  Medication Adjustments/Labs and Tests Ordered: Current medicines are reviewed at length with the patient today.  Concerns regarding medicines are outlined above.  Orders Placed This Encounter  Procedures   Basic Metabolic Panel (BMET)   ECHOCARDIOGRAM COMPLETE   Meds ordered this encounter  Medications   spironolactone (ALDACTONE) 25 MG tablet    Sig: Take 0.5 tablets (12.5 mg total) by mouth daily.    Dispense:  45 tablet    Refill:  3    For delivery    Order Specific Question:   Supervising Provider    Answer:   Thayer Headings [[id number]]   valsartan (DIOVAN) 320 MG tablet    Sig: Take 1 tablet (320 mg total) by mouth daily.    Dispense:  30 tablet    Refill:  11    For delivery    Order Specific Question:   Supervising Provider    Answer:   Thayer Headings [phone]    Patient Instructions  Medication Instructions:   DISCONTINUE Losartan  START Valsartan one (1) tablet by mouth ( 320 mg) daily.   START Spironolactone one half (0.5 mg) tablet by mouth ( 12.5 mg) daily.   *If you need a refill on your cardiac medications before your next appointment, please call your pharmacy*   Lab Work:  Your physician recommends that you return for lab work same day as ECHO. Tuesday, October 10.    If you have labs (blood work) drawn today and your tests are completely normal, you will receive your results only by: [place] (if you have MyChart) OR A paper copy in the mail If you have any lab test that is abnormal or we need to change your treatment, we will call you to review the results.   Testing/Procedures:  Your physician has requested that you have an echocardiogram. Echocardiography is a painless test that uses sound waves to create images of your heart. It provides your doctor with information about the size and shape of your heart and how  well your heart's chambers and valves are working. This procedure takes approximately one hour. There are no restrictions for this procedure.    Follow-Up: At [facility], you and your health needs are our priority.  As part of our continuing mission to provide you with exceptional heart care, we have created designated Provider Care Teams.  These Care Teams include your primary Cardiologist (physician) and Advanced Practice Providers (APPs -  Physician Assistants and Nurse Practitioners) who all work together  to provide you with the care you need, when you need it.  We recommend signing up for the patient portal called "MyChart".  Sign up information is provided on this After Visit Summary.  MyChart is used to connect with patients for Virtual Visits (Telemedicine).  Patients are able to view lab/test results, encounter notes, upcoming appointments, etc.  Non-urgent messages can be sent to your provider as well.   To learn more about what you can do with MyChart, go to [email or web address].    Your next appointment:   6 month(s)  The format for your next appointment:   In Person  Provider:   [name], MD     Important Information About Sugar         Signed, Emmaline Life, NP  04/24/2022 4:19 PM    [place]

## 2022-04-24 ENCOUNTER — Ambulatory Visit: Payer: Medicare Other | Attending: Nurse Practitioner | Admitting: Nurse Practitioner

## 2022-04-24 ENCOUNTER — Encounter: Payer: Self-pay | Admitting: Nurse Practitioner

## 2022-04-24 VITALS — BP 152/78 | HR 73 | Ht 64.0 in | Wt 274.0 lb

## 2022-04-24 DIAGNOSIS — I5032 Chronic diastolic (congestive) heart failure: Secondary | ICD-10-CM

## 2022-04-24 DIAGNOSIS — I10 Essential (primary) hypertension: Secondary | ICD-10-CM | POA: Diagnosis not present

## 2022-04-24 DIAGNOSIS — R0609 Other forms of dyspnea: Secondary | ICD-10-CM

## 2022-04-24 MED ORDER — VALSARTAN 320 MG PO TABS
320.0000 mg | ORAL_TABLET | Freq: Every day | ORAL | 11 refills | Status: DC
Start: 2022-04-24 — End: 2022-05-22

## 2022-04-24 MED ORDER — SPIRONOLACTONE 25 MG PO TABS: 12.5000 mg | ORAL_TABLET | Freq: Every day | ORAL | 3 refills | Status: DC

## 2022-04-24 NOTE — Patient Instructions (Signed)
Medication Instructions:   DISCONTINUE Losartan  START Valsartan one (1) tablet by mouth ( 320 mg) daily.   START Spironolactone one half (0.5 mg) tablet by mouth ( 12.5 mg) daily.   *If you need a refill on your cardiac medications before your next appointment, please call your pharmacy*   Lab Work:  Your physician recommends that you return for lab work same day as ECHO. Tuesday, October 10.    If you have labs (blood work) drawn today and your tests are completely normal, you will receive your results only by: [place] (if you have MyChart) OR A paper copy in the mail If you have any lab test that is abnormal or we need to change your treatment, we will call you to review the results.   Testing/Procedures:  Your physician has requested that you have an echocardiogram. Echocardiography is a painless test that uses sound waves to create images of your heart. It provides your doctor with information about the size and shape of your heart and how well your heart's chambers and valves are working. This procedure takes approximately one hour. There are no restrictions for this procedure.    Follow-Up: At [facility], you and your health needs are our priority.  As part of our continuing mission to provide you with exceptional heart care, we have created designated Provider Care Teams.  These Care Teams include your primary Cardiologist (physician) and Advanced Practice Providers (APPs -  Physician Assistants and Nurse Practitioners) who all work together to provide you with the care you need, when you need it.  We recommend signing up for the patient portal called "MyChart".  Sign up information is provided on this After Visit Summary.  MyChart is used to connect with patients for Virtual Visits (Telemedicine).  Patients are able to view lab/test results, encounter notes, upcoming appointments, etc.  Non-urgent messages can be sent to your provider as well.   To learn  more about what you can do with MyChart, go to [email or web address].    Your next appointment:   6 month(s)  The format for your next appointment:   In Person  Provider:   [name], MD     Important Information About Sugar

## 2022-04-25 ENCOUNTER — Telehealth: Payer: Self-pay | Admitting: *Deleted

## 2022-04-25 NOTE — Telephone Encounter (Signed)
S/w pt to go over all medications. Pt's medications were all correct.  Pt is to D/C potassium per Christen Bame, NP.

## 2022-05-01 ENCOUNTER — Other Ambulatory Visit (HOSPITAL_BASED_OUTPATIENT_CLINIC_OR_DEPARTMENT_OTHER): Payer: Self-pay

## 2022-05-01 MED ORDER — FLUAD QUADRIVALENT 0.5 ML IM PRSY
PREFILLED_SYRINGE | INTRAMUSCULAR | 0 refills | Status: DC
  Filled 2022-05-01: qty 0.5, 1d supply, fill #0

## 2022-05-08 ENCOUNTER — Ambulatory Visit: Payer: Medicare Other

## 2022-05-08 ENCOUNTER — Ambulatory Visit: Payer: Medicare Other | Attending: Cardiology

## 2022-05-08 DIAGNOSIS — R0609 Other forms of dyspnea: Secondary | ICD-10-CM | POA: Diagnosis not present

## 2022-05-08 DIAGNOSIS — I5032 Chronic diastolic (congestive) heart failure: Secondary | ICD-10-CM

## 2022-05-08 DIAGNOSIS — I10 Essential (primary) hypertension: Secondary | ICD-10-CM | POA: Insufficient documentation

## 2022-05-08 LAB — ECHOCARDIOGRAM COMPLETE
Area-P 1/2: 2.59 cm2
S' Lateral: 2.6 cm

## 2022-05-09 LAB — BASIC METABOLIC PANEL
BUN/Creatinine Ratio: 16 (ref 12–28)
BUN: 15 mg/dL (ref 8–27)
CO2: 27 mmol/L (ref 20–29)
Calcium: 9.4 mg/dL (ref 8.7–10.3)
Chloride: 101 mmol/L (ref 96–106)
Creatinine, Ser: 0.93 mg/dL (ref 0.57–1.00)
Glucose: 90 mg/dL (ref 70–99)
Potassium: 4.6 mmol/L (ref 3.5–5.2)
Sodium: 141 mmol/L (ref 134–144)
eGFR: 59 mL/min/{1.73_m2} — ABNORMAL LOW (ref >59)

## 2022-05-10 ENCOUNTER — Ambulatory Visit
Admission: RE | Admit: 2022-05-10 | Discharge: 2022-05-10 | Disposition: A | Discharge status: Home / Self Care | Payer: Medicare Other | Source: Ambulatory Visit | Attending: Adult Health | Admitting: Adult Health

## 2022-05-10 DIAGNOSIS — Z780 Asymptomatic menopausal state: Secondary | ICD-10-CM | POA: Diagnosis not present

## 2022-05-10 DIAGNOSIS — E2839 Other primary ovarian failure: Secondary | ICD-10-CM

## 2022-05-22 ENCOUNTER — Encounter: Payer: Self-pay | Admitting: Internal Medicine

## 2022-05-22 ENCOUNTER — Other Ambulatory Visit: Payer: Self-pay

## 2022-05-22 ENCOUNTER — Non-Acute Institutional Stay: Payer: Medicare Other | Admitting: Internal Medicine

## 2022-05-22 VITALS — BP 174/86 | HR 82 | Temp 97.7°F | Resp 17 | Ht 64.0 in | Wt 274.4 lb

## 2022-05-22 DIAGNOSIS — I10 Essential (primary) hypertension: Secondary | ICD-10-CM | POA: Diagnosis not present

## 2022-05-22 DIAGNOSIS — E039 Hypothyroidism, unspecified: Secondary | ICD-10-CM | POA: Diagnosis not present

## 2022-05-22 DIAGNOSIS — I5032 Chronic diastolic (congestive) heart failure: Secondary | ICD-10-CM

## 2022-05-22 DIAGNOSIS — E7800 Pure hypercholesterolemia, unspecified: Secondary | ICD-10-CM

## 2022-05-22 DIAGNOSIS — F5101 Primary insomnia: Secondary | ICD-10-CM | POA: Diagnosis not present

## 2022-05-22 LAB — COMPREHENSIVE METABOLIC PANEL
Calcium: 9.5 (ref 8.7–10.7)
eGFR: 60

## 2022-05-22 LAB — TSH: TSH: 1.75 (ref 0.41–5.90)

## 2022-05-22 LAB — LIPID PANEL
Cholesterol: 152 (ref 0–200)
HDL: 56 (ref 35–70)
LDL Cholesterol: 73
Triglycerides: 113 (ref 40–160)

## 2022-05-22 LAB — BASIC METABOLIC PANEL
BUN: 19 (ref 4–21)
CO2: 27 — AB (ref 13–22)
Chloride: 101 (ref 99–108)
Creatinine: 0.9 (ref 0.5–1.1)
Glucose: 96
Potassium: 4.6 mEq/L (ref 3.5–5.1)
Sodium: 138 (ref 137–147)

## 2022-05-22 MED ORDER — AMLODIPINE BESYLATE 2.5 MG PO TABS: 2.5000 mg | ORAL_TABLET | Freq: Every day | ORAL | 3 refills | Status: DC

## 2022-05-22 MED ORDER — TRAZODONE HCL 50 MG PO TABS: 50.0000 mg | ORAL_TABLET | Freq: Every day | ORAL | 1 refills | Status: DC

## 2022-05-23 ENCOUNTER — Encounter: Payer: Medicare Other | Admitting: Internal Medicine

## 2022-05-23 ENCOUNTER — Encounter: Payer: Self-pay | Admitting: Internal Medicine

## 2022-05-23 NOTE — Progress Notes (Signed)
Location:  [place] of Service:  Clinic (12)  Provider:   Code Status:  Goals of Care:     05/22/2022    1:05 PM  Advanced Directives  Does Patient Have a Medical Advance Directive? Yes  Type of Advance Directive Living will;Out of facility DNR (pink MOST or yellow form);Healthcare Power of Attorney  Does patient want to make changes to medical advance directive? No - Patient declined  Copy of [place] in Chart? Yes - validated most recent copy scanned in chart (See row information)     Chief Complaint  Patient presents with   Medical Management of Chronic Issues    6 month follow up   Immunizations    Discussed the need for Covid booster     Referral    Patient states she wants to discuss a referral for Dermatology for moles     HPI: Patient is a 88 y.o. female seen today for medical management of chronic diseases.    Lives in [place] in [place]  Patient has a history of left total knee revision by Dr. Berenice Primas on 07/01/2020 History of chronic diastolic CHF, hyperlipidemia, and hypothyroidism   HTN BP running high started on Diovan and Aldactone by Cardiology But BP still high Weight gain Very sedentary Has gained almost 10 Lbs since last visit Stopped swimming or walking much Uses walker now Wants to see Derm in facility for her moles in her back Insomnia On Trazodone  Retired OR Nurse  Past Medical History:  Diagnosis Date   Arthritis    Cancer ([place])    basal cell skin cancer   Concussion    Dyspnea    upon exertion   Hypertension    Hypothyroidism    Stress incontinence    Total knee replacement status, bilateral     Past Surgical History:  Procedure Laterality Date   ABDOMINAL HYSTERECTOMY     APPENDECTOMY     CARPAL TUNNEL RELEASE     rt x2   COLONOSCOPY     EYE SURGERY Bilateral    cataract surgery with lens implant   FINGER AMPUTATION Right    little finger   I & D EXTREMITY  03/11/2012    Procedure: MINOR IRRIGATION AND DEBRIDEMENT EXTREMITY;  Surgeon: Cammie Sickle., MD;  Location: [facility];  Service: Orthopedics;  Laterality: Right;  Right little finger   KNEE ARTHROSCOPY     bilat    REPLACEMENT TOTAL KNEE BILATERAL     REVERSE SHOULDER ARTHROPLASTY Right 09/04/2018   Procedure: REVERSE SHOULDER ARTHROPLASTY;  Surgeon: Tania Ade, MD;  Location: [facility];  Service: Orthopedics;  Laterality: Right;   TONSILLECTOMY     TOTAL KNEE REVISION Right 03/14/2015   Procedure: TIBIAL REVISION, OPEN REDUCTION INTERNAL FIXATION PROXIMAL TIBIA;  Surgeon: Dorna Leitz, MD;  Location: [facility];  Service: Orthopedics;  Laterality: Right;   TOTAL KNEE REVISION Left 07/01/2020   Procedure: TOTAL KNEE REVISION;  Surgeon: Dorna Leitz, MD;  Location: [facility];  Service: Orthopedics;  Laterality: Left;   TRIGGER FINGER RELEASE     TUBAL LIGATION      Allergies  Allergen Reactions   Phenergan [Promethazine Hcl] Other (See Comments)    hallucinations   Tizanidine     delirium   Lisinopril Cough    Outpatient Encounter Medications as of 05/22/2022  Medication Sig   amLODipine (NORVASC) 2.5 MG tablet Take 1 tablet (2.5 mg total) by mouth  daily.   aspirin EC 81 MG tablet Take 81 mg by mouth daily.   Calcium Carb-Cholecalciferol (CALCIUM 600/VITAMIN D3 PO) Take 1 tablet by mouth daily.   Cholecalciferol (VITAMIN D-3) 1000 units CAPS Take 1,000 Units by mouth daily.    ferrous sulfate 325 (65 FE) MG tablet Take 325 mg by mouth 3 (three) times a week.   folic acid (FOLVITE) 950 MCG tablet Take 800 mcg by mouth daily.    influenza vaccine adjuvanted (FLUAD QUADRIVALENT) 0.5 ML injection Inject into the muscle.   levothyroxine (SYNTHROID) 125 MCG tablet Take 1 tablet (125 mcg total) by mouth daily before breakfast.   metoprolol tartrate (LOPRESSOR) 25 MG tablet TAKE 1 TABLET (25 MG TOTAL) BY MOUTH 2 (TWO) TIMES DAILY WITH A MEAL.   Multiple Vitamins-Minerals (PRESERVISION AREDS  PO) Take 1 capsule by mouth in the morning and at bedtime.   pravastatin (PRAVACHOL) 80 MG tablet TAKE 1 TABLET BY MOUTH EVERY DAY   spironolactone (ALDACTONE) 25 MG tablet Take 0.5 tablets (12.5 mg total) by mouth daily.   valsartan (DIOVAN) 320 MG tablet Take 320 mg by mouth daily.   vitamin C (ASCORBIC ACID) 500 MG tablet Take 500 mg by mouth daily.   [DISCONTINUED] Nirmatrelvir-Ritonavir (PAXLOVID, 150/100, PO) Take 1 tablet by mouth 2 (two) times daily.   [DISCONTINUED] traZODone (DESYREL) 50 MG tablet TAKE 1 TABLET BY MOUTH EVERYDAY AT BEDTIME   [DISCONTINUED] valsartan (DIOVAN) 320 MG tablet Take 1 tablet (320 mg total) by mouth daily.   traZODone (DESYREL) 50 MG tablet Take 1 tablet (50 mg total) by mouth at bedtime.   [DISCONTINUED] losartan (COZAAR) 100 MG tablet Take 100 mg by mouth daily.   No facility-administered encounter medications on file as of 05/22/2022.    Review of Systems:  Review of Systems  Constitutional:  Negative for activity change and appetite change.  HENT: Negative.    Respiratory:  Negative for cough and shortness of breath.   Cardiovascular:  Negative for leg swelling.  Gastrointestinal:  Negative for constipation.  Genitourinary: Negative.   Musculoskeletal:  Positive for gait problem. Negative for arthralgias and myalgias.  Skin: Negative.   Neurological:  Negative for dizziness and weakness.  Psychiatric/Behavioral:  Negative for confusion, dysphoric mood and sleep disturbance.     Health Maintenance  Topic Date Due   COVID-19 Vaccine (4 - Moderna risk series) 03/10/2021   Medicare Annual Wellness (AWV)  07/07/2021   Pneumonia Vaccine 64+ Years old (31 - PPSV23 or PCV20) 10/29/2022 (Originally 02/28/2016)   TETANUS/TDAP  02/26/2030   INFLUENZA VACCINE  Completed   DEXA SCAN  Completed   Zoster Vaccines- Shingrix  Completed   HPV VACCINES  Aged Out    Physical Exam: Vitals:   05/22/22 1305  BP: (!) 174/86  Pulse: 82  Resp: 17  Temp: 97.7  F (36.5 C)  TempSrc: Temporal  SpO2: 97%  Weight: 274 lb 6.4 oz (124.5 kg)  Height: '5\' 4"'$  (1.626 m)   Body mass index is 47.1 kg/m. Physical Exam Vitals reviewed.  Constitutional:      Appearance: Normal appearance. She is obese.  HENT:     Head: Normocephalic.     Right Ear: Tympanic membrane normal.     Left Ear: Tympanic membrane normal.     Nose: Nose normal.     Mouth/Throat:     Mouth: Mucous membranes are moist.     Pharynx: Oropharynx is clear.  Eyes:     Pupils: Pupils are equal, round,  and reactive to light.  Cardiovascular:     Rate and Rhythm: Normal rate and regular rhythm.     Pulses: Normal pulses.     Heart sounds: Normal heart sounds. No murmur heard. Pulmonary:     Effort: Pulmonary effort is normal.     Breath sounds: Normal breath sounds.  Abdominal:     General: Abdomen is flat. Bowel sounds are normal.     Palpations: Abdomen is soft.  Musculoskeletal:        General: Swelling present.     Cervical back: Neck supple.  Skin:    General: Skin is warm.  Neurological:     General: No focal deficit present.     Mental Status: She is alert and oriented to person, place, and time.  Psychiatric:        Mood and Affect: Mood normal.        Thought Content: Thought content normal.    Labs reviewed: Basic Metabolic Panel: Recent Labs    12/11/21 0000 05/08/22 1344 05/22/22 0000  NA 132* 141 138  K 4.3 4.6 4.6  CL 93* 101 101  CO2 29* 27 27*  GLUCOSE  --  90  --   BUN '13 15 19  '$ CREATININE 1.0 0.93 0.9  CALCIUM 9.2 9.4 9.5  TSH  --   --  1.75   Liver Function Tests: Recent Labs    12/11/21 0000  AST 36*  ALT 19  ALKPHOS 57  ALBUMIN 3.9   No results for input(s): "LIPASE", "AMYLASE" in the last 8760 hours. No results for input(s): "AMMONIA" in the last 8760 hours. CBC: Recent Labs    12/11/21 0000  WBC 3.0  HGB 13.0  HCT 39  PLT 209   Lipid Panel: Recent Labs    05/22/22 0000  CHOL 152  HDL 56  LDLCALC 73  TRIG 113    Lab Results  Component Value Date   HGBA1C 5.5 06/16/2014    Procedures since last visit: DG Bone Density  Result Date: 05/10/2022 EXAM: DUAL X-RAY ABSORPTIOMETRY (DXA) FOR BONE MINERAL DENSITY IMPRESSION: Referring Physician:  Royal Hawthorn Your patient completed a bone mineral density test using GE Lunar iDXA system (analysis version: 16). Technologist: EDH PATIENT: Name: Chamya, Hunton Patient ID:  [id number] Birth Date: 1931/06/22 Height:     63.0 in. Sex:         Female Measured:   05/10/2022 Weight:     270.6 lbs. Indications: Advanced Age, Caucasian, Desyrel, Estrogen Deficient, Hypothyroid, Hysterectomy, Levothyroxine, osteoarthiritis, Postmenopausal Fractures: None Treatments: Calcium (E943.0), Vitamin D (E933.5) ASSESSMENT: The BMD measured at Forearm Radius 33% is 0.921 g/cm2 with a T-score of 0.5. This patient is considered normal according to [facility]) criteria. The quality of the exam is good. The lumbar spine was excluded due to being excluded on prior exam. Site Region Measured Date Measured Age YA BMD Significant CHANGE T-score Right Forearm Radius 33% 7/10/2022 89.2 0.7 0.921 g/cm2 Right Forearm Radius 33% 09/01/2019 86.5 0.6 0.921 g/cm2 DualFemur Neck Left 05/10/2022 89.2 0.8 1.143 g/cm2 * DualFemur Neck Left 09/01/2019 86.5 1.7 1.272 g/cm2 DualFemur Total Mean 05/10/2022 89.2 1.8 1.236 g/cm2 * DualFemur Total Mean 09/01/2019 86.5 2.2 1.288 g/cm2 World Health Organization [facility]) criteria for post-menopausal, Caucasian Women: Normal       T-score at or above -1 SD Osteopenia   T-score between -1 and -2.5 SD Osteoporosis T-score at or below -2.5 SD RECOMMENDATION: 1. All patients should optimize calcium and vitamin  D intake. 2. Consider FDA-approved medical therapies in postmenopausal women and men aged 57 years and older, based on the following: a. A hip or vertebral (clinical or morphometric) fracture. b. T-score = -2.5 at the femoral neck or spine after  appropriate evaluation to exclude secondary causes. c. Low bone mass (T-score between -1.0 and -2.5 at the femoral neck or spine) and a 10-year probability of a hip fracture = 3% or a 10-year probability of a major osteoporosis-related fracture = 20% based on the [place]-adapted WHO algorithm. d. Clinician judgment and/or patient preferences may indicate treatment for people with 10-year fracture probabilities above or below these levels. FOLLOW-UP: Patients with diagnosis of osteoporosis or at high risk for fracture should have regular bone mineral density tests. Patients eligible for Medicare are allowed routine testing every 2 years. The testing frequency can be increased to one year for patients who have rapidly progressing disease, are receiving or discontinuing medical therapy to restore bone mass, or have additional risk factors. I have reviewed this study and agree with the findings. [facility] Electronically Signed   By: Ammie Ferrier M.D.   On: 05/10/2022 13:45   ECHOCARDIOGRAM COMPLETE  Result Date: 05/08/2022    ECHOCARDIOGRAM REPORT   Patient Name:   GWENDALYNN ECKSTROM Date of Exam: 05/08/2022 Medical Rec #:  [id number]         Height:       64.0 in Accession #:    [id number]        Weight:       274.0 lb Date of Birth:  26-Jun-1931         BSA:          2.236 m Patient Age:    87 years          BP:           152/78 mmHg Patient Gender: F                 HR:           68 bpm. Exam Location:  [facility] Procedure: 2D Echo, Cardiac Doppler and Color Doppler Indications:    R06.00 Dyspnea  History:        Patient has prior history of Echocardiogram examinations, most                 recent 06/06/2016. Signs/Symptoms:Dyspnea; Risk                 Factors:Hypertension.  Sonographer:    Cresenciano Lick RDCS Referring Phys: [place]  1. Left ventricular ejection fraction, by estimation, is 55 to 60%. The left ventricle has normal function. The left ventricle  has no regional wall motion abnormalities. There is severe left ventricular hypertrophy. Left ventricular diastolic parameters  are consistent with Grade I diastolic dysfunction (impaired relaxation). Elevated left atrial pressure.  2. Right ventricular systolic function is normal. The right ventricular size is normal.  3. Left atrial size was mildly dilated.  4. The mitral valve is normal in structure. Trivial mitral valve regurgitation. No evidence of mitral stenosis. Moderate mitral annular calcification.  5. The aortic valve is tricuspid. Aortic valve regurgitation is not visualized. Aortic valve sclerosis/calcification is present, without any evidence of aortic stenosis.  6. The inferior vena cava is normal in size with greater than 50% respiratory variability, suggesting right atrial pressure of 3 mmHg. FINDINGS  Left Ventricle: Left ventricular ejection fraction, by estimation, is 55 to 60%. The  left ventricle has normal function. The left ventricle has no regional wall motion abnormalities. The left ventricular internal cavity size was normal in size. There is  severe left ventricular hypertrophy. Left ventricular diastolic parameters are consistent with Grade I diastolic dysfunction (impaired relaxation). Elevated left atrial pressure. Right Ventricle: The right ventricular size is normal. Right ventricular systolic function is normal. Left Atrium: Left atrial size was mildly dilated. Right Atrium: Right atrial size was normal in size. Pericardium: There is no evidence of pericardial effusion. Mitral Valve: The mitral valve is normal in structure. Moderate mitral annular calcification. Trivial mitral valve regurgitation. No evidence of mitral valve stenosis. Tricuspid Valve: The tricuspid valve is normal in structure. Tricuspid valve regurgitation is trivial. No evidence of tricuspid stenosis. Aortic Valve: The aortic valve is tricuspid. Aortic valve regurgitation is not visualized. Aortic valve  sclerosis/calcification is present, without any evidence of aortic stenosis. Pulmonic Valve: The pulmonic valve was not well visualized. Pulmonic valve regurgitation is not visualized. No evidence of pulmonic stenosis. Aorta: The aortic root is normal in size and structure. Venous: The inferior vena cava is normal in size with greater than 50% respiratory variability, suggesting right atrial pressure of 3 mmHg. IAS/Shunts: No atrial level shunt detected by color flow Doppler.  LEFT VENTRICLE PLAX 2D LVIDd:         4.50 cm   Diastology LVIDs:         2.60 cm   LV e' medial:    4.57 cm/s LV PW:         1.10 cm   LV E/e' medial:  21.4 LV IVS:        1.10 cm   LV e' lateral:   5.22 cm/s LVOT diam:     1.80 cm   LV E/e' lateral: 18.7 LV SV:         69 LV SV Index:   31 LVOT Area:     2.54 cm  RIGHT VENTRICLE             IVC RV Basal diam:  3.80 cm     IVC diam: 1.70 cm RV S prime:     19.30 cm/s TAPSE (M-mode): 2.5 cm LEFT ATRIUM             Index        RIGHT ATRIUM           Index LA diam:        5.45 cm 2.44 cm/m   RA Area:     10.60 cm LA Vol (A2C):   58.0 ml 25.93 ml/m  RA Volume:   25.00 ml  11.18 ml/m LA Vol (A4C):   78.3 ml 35.01 ml/m LA Biplane Vol: 70.0 ml 31.30 ml/m  AORTIC VALVE LVOT Vmax:   115.00 cm/s LVOT Vmean:  76.000 cm/s LVOT VTI:    0.271 m  AORTA Ao Root diam: 3.10 cm Ao Asc diam:  3.50 cm MITRAL VALVE MV Area (PHT): 2.59 cm     SHUNTS MV Decel Time: 293 msec     Systemic VTI:  0.27 m MV E velocity: 97.75 cm/s   Systemic Diam: 1.80 cm MV A velocity: 133.00 cm/s MV E/A ratio:  0.73 Kirk Ruths MD Electronically signed by Kirk Ruths MD Signature Date/Time: 05/08/2022/1:59:07 PM    Final     Assessment/Plan 1. Essential hypertension Add Norvasc 2.5 mg  Follow up in 4 weeks to check BP Also oN Aldactone, Metoprolol  and Valsartan  2. Chronic diastolic  congestive heart failure (HCC) Recent Echo showed EF of 55-60% On Aldactone and Metoprolol  3. Acquired hypothyroidism Tsh  Normal in 10/23  4. Morbid (severe) obesity due to excess calories [facility]) Very aware We talked about increasing exercise /walking Diet control  5. Pure hypercholesterolemia On statin LDL 73  6. Primary insomnia Continue Trazodone 7 DEXA discussed T score normal    Labs/tests ordered:  * No order type specified * Next appt:  05/23/2022

## 2022-06-17 NOTE — Progress Notes (Unsigned)
Location:  [facility]  POS: Clinic  Provider: Royal Hawthorn, ANP  Code Status: DNR Goals of Care:     05/22/2022    1:05 PM  Advanced Directives  Does Patient Have a Medical Advance Directive? Yes  Type of Advance Directive Living will;Out of facility DNR (pink MOST or yellow form);Healthcare Power of Attorney  Does patient want to make changes to medical advance directive? No - Patient declined  Copy of [place] in Chart? Yes - validated most recent copy scanned in chart (See row information)     Chief Complaint  Patient presents with   Medical Management of Chronic Issues    4 week follow up.   Quality Metric Gaps    Discussed the need for AWV    HPI: Patient is a 88 y.o. female seen today for medical management of HTN  She was started on Norvasc 2.5 mg one month ago by Dr Lyndel Safe.  Also had been sedentary and gaining weight.  She has been followed by cardiology for HTN and chronic HFpEF  She was started on HCTZ by cardiology but stopped it due to frequency, Losartan was also increased by cardiology due to HTN June 2023 which was later stopped and changed to valsartan and aldactone added.  Repeat echo 05/08/22 showed EF 55-60% with severe LFH and mildly impaired relaxation. No valvular issues.   LDL 73 TSH 1.75  05/22/22  BUN 19 CR 0.9 05/22/22  Has not tried to lose weight.   Not formally exercising but trying to walk more.   Wt Readings from Last 3 Encounters:  06/18/22 274 lb (124.3 kg)  05/22/22 274 lb 6.4 oz (124.5 kg)  04/24/22 274 lb (124.3 kg)     Past Medical History:  Diagnosis Date   Arthritis    Cancer ([place])    basal cell skin cancer   Concussion    Dyspnea    upon exertion   Hypertension    Hypothyroidism    Stress incontinence    Total knee replacement status, bilateral     Past Surgical History:  Procedure Laterality Date   ABDOMINAL HYSTERECTOMY     APPENDECTOMY     CARPAL TUNNEL RELEASE     rt x2    COLONOSCOPY     EYE SURGERY Bilateral    cataract surgery with lens implant   FINGER AMPUTATION Right    little finger   I & D EXTREMITY  03/11/2012   Procedure: MINOR IRRIGATION AND DEBRIDEMENT EXTREMITY;  Surgeon: Cammie Sickle., MD;  Location: [facility];  Service: Orthopedics;  Laterality: Right;  Right little finger   KNEE ARTHROSCOPY     bilat    REPLACEMENT TOTAL KNEE BILATERAL     REVERSE SHOULDER ARTHROPLASTY Right 09/04/2018   Procedure: REVERSE SHOULDER ARTHROPLASTY;  Surgeon: Tania Ade, MD;  Location: [facility];  Service: Orthopedics;  Laterality: Right;   TONSILLECTOMY     TOTAL KNEE REVISION Right 03/14/2015   Procedure: TIBIAL REVISION, OPEN REDUCTION INTERNAL FIXATION PROXIMAL TIBIA;  Surgeon: Dorna Leitz, MD;  Location: [facility];  Service: Orthopedics;  Laterality: Right;   TOTAL KNEE REVISION Left 07/01/2020   Procedure: TOTAL KNEE REVISION;  Surgeon: Dorna Leitz, MD;  Location[facility];  Service: Orthopedics;  Laterality: Left;   TRIGGER FINGER RELEASE     TUBAL LIGATION      Allergies  Allergen Reactions   Phenergan [Promethazine Hcl] Other (See Comments)    hallucinations   Tizanidine  delirium   Lisinopril Cough    Outpatient Encounter Medications as of 06/18/2022  Medication Sig   amLODipine (NORVASC) 2.5 MG tablet Take 1 tablet (2.5 mg total) by mouth daily.   aspirin EC 81 MG tablet Take 81 mg by mouth daily.   Calcium Carb-Cholecalciferol (CALCIUM 600/VITAMIN D3 PO) Take 1 tablet by mouth daily.   Cholecalciferol (VITAMIN D-3) 1000 units CAPS Take 1,000 Units by mouth daily.    ferrous sulfate 325 (65 FE) MG tablet Take 325 mg by mouth 3 (three) times a week.   folic acid (FOLVITE) 440 MCG tablet Take 800 mcg by mouth daily.    levothyroxine (SYNTHROID) 125 MCG tablet Take 1 tablet (125 mcg total) by mouth daily before breakfast.   metoprolol tartrate (LOPRESSOR) 25 MG tablet TAKE 1 TABLET (25 MG TOTAL) BY MOUTH 2 (TWO) TIMES DAILY  WITH A MEAL.   Multiple Vitamins-Minerals (PRESERVISION AREDS PO) Take 1 capsule by mouth in the morning and at bedtime.   pravastatin (PRAVACHOL) 80 MG tablet TAKE 1 TABLET BY MOUTH EVERY DAY   spironolactone (ALDACTONE) 25 MG tablet Take 0.5 tablets (12.5 mg total) by mouth daily.   traZODone (DESYREL) 50 MG tablet Take 1 tablet (50 mg total) by mouth at bedtime.   valsartan (DIOVAN) 320 MG tablet Take 320 mg by mouth daily.   vitamin C (ASCORBIC ACID) 500 MG tablet Take 500 mg by mouth daily.   [DISCONTINUED] influenza vaccine adjuvanted (FLUAD QUADRIVALENT) 0.5 ML injection Inject into the muscle.   No facility-administered encounter medications on file as of 06/18/2022.    Review of Systems:  Review of Systems  Constitutional:  Negative for activity change, appetite change, chills, diaphoresis, fatigue, fever and unexpected weight change.  HENT:  Negative for congestion.   Respiratory:  Negative for cough, shortness of breath and wheezing.   Cardiovascular:  Negative for chest pain, palpitations and leg swelling.  Gastrointestinal:  Negative for abdominal distention, abdominal pain, constipation and diarrhea.  Genitourinary:  Negative for difficulty urinating and dysuria.  Musculoskeletal:  Negative for arthralgias, back pain, gait problem, joint swelling and myalgias.  Neurological:  Negative for dizziness, tremors, seizures, syncope, facial asymmetry, speech difficulty, weakness, light-headedness, numbness and headaches.  Psychiatric/Behavioral:  Negative for agitation, behavioral problems and confusion.     Health Maintenance  Topic Date Due   Medicare Annual Wellness (AWV)  06/07/2021   Pneumonia Vaccine 79+ Years old (68 - PPSV23 or PCV20) 10/29/2022 (Originally 02/28/2016)   COVID-19 Vaccine (4 - Moderna risk series) 07/25/2022   INFLUENZA VACCINE  Completed   DEXA SCAN  Completed   Zoster Vaccines- Shingrix  Completed   HPV VACCINES  Aged Out    Physical Exam: Vitals:    06/18/22 1346  BP: (!) 150/82  Pulse: 87  Resp: 18  Temp: 98.1 F (36.7 C)  TempSrc: Temporal  SpO2: 97%  Weight: 274 lb (124.3 kg)  Height: '5\' 4"'$  (1.626 m)   Body mass index is 47.03 kg/m. Physical Exam Vitals and nursing note reviewed.  Constitutional:      General: She is not in acute distress.    Appearance: She is not diaphoretic.  HENT:     Head: Normocephalic and atraumatic.  Neck:     Vascular: No JVD.  Cardiovascular:     Rate and Rhythm: Normal rate and regular rhythm.     Heart sounds: No murmur heard. Pulmonary:     Effort: Pulmonary effort is normal. No respiratory distress.  Breath sounds: Normal breath sounds. No wheezing.  Skin:    General: Skin is warm and dry.  Neurological:     Mental Status: She is alert and oriented to person, place, and time.     Labs reviewed: Basic Metabolic Panel: Recent Labs    12/11/21 0000 05/08/22 1344 05/22/22 0000  NA 132* 141 138  K 4.3 4.6 4.6  CL 93* 101 101  CO2 29* 27 27*  GLUCOSE  --  90  --   BUN '13 15 19  '$ CREATININE 1.0 0.93 0.9  CALCIUM 9.2 9.4 9.5  TSH  --   --  1.75   Liver Function Tests: Recent Labs    12/11/21 0000  AST 36*  ALT 19  ALKPHOS 57  ALBUMIN 3.9   No results for input(s): "LIPASE", "AMYLASE" in the last 8760 hours. No results for input(s): "AMMONIA" in the last 8760 hours. CBC: Recent Labs    12/11/21 0000  WBC 3.0  HGB 13.0  HCT 39  PLT 209   Lipid Panel: Recent Labs    05/22/22 0000  CHOL 152  HDL 56  LDLCALC 73  TRIG 113   Lab Results  Component Value Date   HGBA1C 5.5 06/16/2014    Procedures since last visit: No results found.  Assessment/Plan 1. Essential hypertension Improved Continue Norvasc, Lopressor, Aldactone, Diovan Try to do cardio such as bike or walking 20-30 min 5 days a week  Try to eliminate salted food and processed food   2. Chronic diastolic CHF Has mild DOE which is unchanged.  Weight unchanged BP improved Followed by  Cardiology   3. Morbid obesity BMI 47.03 See above.    Labs/tests ordered:  * No order type specified * Next appt:  3 months    Total time 37mn:  time greater than 50% of total time spent doing pt counseling and coordination of care

## 2022-06-18 ENCOUNTER — Non-Acute Institutional Stay: Payer: Medicare Other | Admitting: Adult Health

## 2022-06-18 ENCOUNTER — Encounter: Payer: Self-pay | Admitting: Adult Health

## 2022-06-18 VITALS — BP 150/82 | HR 87 | Temp 98.1°F | Resp 18 | Ht 64.0 in | Wt 274.0 lb

## 2022-06-18 DIAGNOSIS — I10 Essential (primary) hypertension: Secondary | ICD-10-CM

## 2022-06-18 DIAGNOSIS — I5032 Chronic diastolic (congestive) heart failure: Secondary | ICD-10-CM | POA: Diagnosis not present

## 2022-06-18 NOTE — Patient Instructions (Signed)
Try to do cardio such as bike or walking 20-30 min 5 days a week   Try to eliminate salted food and processed food   Your BP has improved.

## 2022-07-01 ENCOUNTER — Other Ambulatory Visit: Payer: Self-pay | Admitting: Internal Medicine

## 2022-07-01 DIAGNOSIS — E039 Hypothyroidism, unspecified: Secondary | ICD-10-CM

## 2022-07-18 DIAGNOSIS — C44399 Other specified malignant neoplasm of skin of other parts of face: Secondary | ICD-10-CM | POA: Diagnosis not present

## 2022-07-18 DIAGNOSIS — L821 Other seborrheic keratosis: Secondary | ICD-10-CM | POA: Diagnosis not present

## 2022-07-18 DIAGNOSIS — L570 Actinic keratosis: Secondary | ICD-10-CM | POA: Diagnosis not present

## 2022-07-18 DIAGNOSIS — L820 Inflamed seborrheic keratosis: Secondary | ICD-10-CM | POA: Diagnosis not present

## 2022-07-18 DIAGNOSIS — L304 Erythema intertrigo: Secondary | ICD-10-CM | POA: Diagnosis not present

## 2022-08-01 ENCOUNTER — Telehealth: Payer: Self-pay

## 2022-08-01 NOTE — Telephone Encounter (Signed)
Spoke with patient and she was made aware that message has been sent to [place] at [facility] to come see her. She verbalized her understanding and agreed.

## 2022-08-01 NOTE — Telephone Encounter (Signed)
Unfortunately clinic is not open today. I have left message for [place] nurse to assess patient in her apartment.

## 2022-08-01 NOTE — Telephone Encounter (Signed)
Patient states that she has had a bad cough (chest cold). She has had cold for about 3 weeks. Patient states that she doesn't understand how to do a virtual visit and she doesn't want to come in office.  Message routed to Windell Moulding, NP

## 2022-08-02 ENCOUNTER — Ambulatory Visit
Admission: RE | Admit: 2022-08-02 | Discharge: 2022-08-02 | Disposition: A | Discharge status: Home / Self Care | Payer: Medicare Other | Source: Ambulatory Visit | Attending: Orthopedic Surgery | Admitting: Orthopedic Surgery

## 2022-08-02 ENCOUNTER — Ambulatory Visit (INDEPENDENT_AMBULATORY_CARE_PROVIDER_SITE_OTHER): Payer: Medicare Other | Admitting: Orthopedic Surgery

## 2022-08-02 ENCOUNTER — Encounter: Payer: Self-pay | Admitting: Orthopedic Surgery

## 2022-08-02 VITALS — BP 128/79 | HR 63 | Temp 96.8°F | Resp 18 | Ht 64.0 in | Wt 270.4 lb

## 2022-08-02 DIAGNOSIS — R051 Acute cough: Secondary | ICD-10-CM

## 2022-08-02 DIAGNOSIS — R059 Cough, unspecified: Secondary | ICD-10-CM | POA: Diagnosis not present

## 2022-08-02 NOTE — Progress Notes (Signed)
Careteam: Patient Care Team: Virgie Dad, MD as PCP - General (Internal Medicine) Nahser, Wonda Cheng, MD as PCP - Cardiology (Cardiology) Tania Ade, MD as Consulting Physician (Orthopedic Surgery) Nahser, Wonda Cheng, MD as Consulting Physician (Cardiology) Darleen Crocker, MD as Consulting Physician (Ophthalmology) Irine Seal, MD as Attending Physician (Urology) Dorna Leitz, MD as Consulting Physician (Orthopedic Surgery) Tanda Rockers, MD as Consulting Physician (Pulmonary Disease)  Seen by: Windell Moulding, AGNP-C  PLACE OF SERVICE:  [place] Directive information Does Patient Have a Medical Advance Directive?: Yes, Type of Advance Directive: [place];Living will;Out of facility DNR (pink MOST or yellow form), Does patient want to make changes to medical advance directive?: No - Patient declined  Allergies  Allergen Reactions   Phenergan [Promethazine Hcl] Other (See Comments)    hallucinations   Tizanidine     delirium   Lisinopril Cough    Chief Complaint  Patient presents with   Medical Management of Chronic Issues    Patient is here to seen in the office for Chronic cough.   Quality Metric Gaps    Needs to discuss about Medicare Annual Wellness and Covid-19 vaccine. [facility] verified      HPI: Patient is a 89 y.o. female seen today for acute visit due to ongoing cough.   01/03 she was seen by [facility] nurse due to ongoing cough. Rapid flu and covid negative. Reports dry cough, mild sob and fatigue. Denies fever, sore throat, chest pain, nasal congestion. She has not tried anything over the counter except cough drops. She had RSV vaccine about 2 weeks prior to onset of cough. Denies being around sick persons.   Review of Systems:  Review of Systems  Constitutional:  Positive for malaise/fatigue. Negative for chills and fever.  HENT:  Negative for congestion, ear discharge, ear pain and sore throat.   Eyes:  Negative for  blurred vision and double vision.  Respiratory:  Positive for cough and shortness of breath. Negative for wheezing.   Cardiovascular:  Negative for chest pain and leg swelling.  Gastrointestinal:  Negative for diarrhea and nausea.  Genitourinary:  Negative for dysuria.  Musculoskeletal:  Negative for myalgias.  Skin:  Negative for rash.  Neurological:  Negative for dizziness, weakness and headaches.  Psychiatric/Behavioral:  Negative for depression. The patient has insomnia. The patient is not nervous/anxious.     Past Medical History:  Diagnosis Date   Arthritis    Cancer ([place])    basal cell skin cancer   Concussion    Dyspnea    upon exertion   Hypertension    Hypothyroidism    Stress incontinence    Total knee replacement status, bilateral    Past Surgical History:  Procedure Laterality Date   ABDOMINAL HYSTERECTOMY     APPENDECTOMY     CARPAL TUNNEL RELEASE     rt x2   COLONOSCOPY     EYE SURGERY Bilateral    cataract surgery with lens implant   FINGER AMPUTATION Right    little finger   I & D EXTREMITY  03/11/2012   Procedure: MINOR IRRIGATION AND DEBRIDEMENT EXTREMITY;  Surgeon: Cammie Sickle., MD;  Location: [facility];  Service: Orthopedics;  Laterality: Right;  Right little finger   KNEE ARTHROSCOPY     bilat    REPLACEMENT TOTAL KNEE BILATERAL     REVERSE SHOULDER ARTHROPLASTY Right 09/04/2018   Procedure: REVERSE SHOULDER ARTHROPLASTY;  Surgeon: Tania Ade,  MD;  Location: [facility];  Service: Orthopedics;  Laterality: Right;   TONSILLECTOMY     TOTAL KNEE REVISION Right 03/14/2015   Procedure: TIBIAL REVISION, OPEN REDUCTION INTERNAL FIXATION PROXIMAL TIBIA;  Surgeon: Dorna Leitz, MD;  Location: [facility] [facility];  Service: Orthopedics;  Laterality: Right;   TOTAL KNEE REVISION Left 07/01/2020   Procedure: TOTAL KNEE REVISION;  Surgeon: Dorna Leitz, MD;  Location: [facility];  Service: Orthopedics;  Laterality: Left;   TRIGGER FINGER RELEASE     TUBAL  LIGATION     Social History:   reports that she has never smoked. She has never used smokeless tobacco. She reports current alcohol use of about 2.0 standard drinks of alcohol per week. She reports that she does not use drugs.  Family History  Problem Relation Age of Onset   Bladder Cancer Father     Medications: Patient's Medications  New Prescriptions   No medications on file  Previous Medications   AMLODIPINE (NORVASC) 2.5 MG TABLET    Take 1 tablet (2.5 mg total) by mouth daily.   ASPIRIN EC 81 MG TABLET    Take 81 mg by mouth daily.   CALCIUM CARB-CHOLECALCIFEROL (CALCIUM 600/VITAMIN D3 PO)    Take 1 tablet by mouth daily.   CHOLECALCIFEROL (VITAMIN D-3) 1000 UNITS CAPS    Take 1,000 Units by mouth daily.    FERROUS SULFATE 325 (65 FE) MG TABLET    Take 325 mg by mouth 3 (three) times a week.   FOLIC ACID (FOLVITE) 161 MCG TABLET    Take 800 mcg by mouth daily.    LEVOTHYROXINE (SYNTHROID) 125 MCG TABLET    TAKE 1 TABLET BY MOUTH DAILY BEFORE BREAKFAST.   METOPROLOL TARTRATE (LOPRESSOR) 25 MG TABLET    TAKE 1 TABLET (25 MG TOTAL) BY MOUTH 2 (TWO) TIMES DAILY WITH A MEAL.   MULTIPLE VITAMINS-MINERALS (PRESERVISION AREDS PO)    Take 1 capsule by mouth in the morning and at bedtime.   PRAVASTATIN (PRAVACHOL) 80 MG TABLET    TAKE 1 TABLET BY MOUTH EVERY DAY   SPIRONOLACTONE (ALDACTONE) 25 MG TABLET    Take 0.5 tablets (12.5 mg total) by mouth daily.   TRAZODONE (DESYREL) 50 MG TABLET    Take 1 tablet (50 mg total) by mouth at bedtime.   VALSARTAN (DIOVAN) 320 MG TABLET    Take 320 mg by mouth daily.   VITAMIN C (ASCORBIC ACID) 500 MG TABLET    Take 500 mg by mouth daily.  Modified Medications   No medications on file  Discontinued Medications   No medications on file    Physical Exam:  Vitals:   08/02/22 1059  BP: 128/79  Pulse: 63  Resp: 18  Temp: (!) 96.8 F (36 C)  SpO2: 95%  Weight: 270 lb 6 oz (122.6 kg)  Height: '5\' 4"'$  (1.626 m)   Body mass index is 46.41  kg/m. Wt Readings from Last 3 Encounters:  08/02/22 270 lb 6 oz (122.6 kg)  06/18/22 274 lb (124.3 kg)  05/22/22 274 lb 6.4 oz (124.5 kg)    Physical Exam Vitals reviewed.  Constitutional:      General: She is not in acute distress.    Appearance: She is obese.  HENT:     Head: Normocephalic.  Eyes:     General:        Right eye: No discharge.        Left eye: No discharge.  Cardiovascular:     Rate  and Rhythm: Normal rate and regular rhythm.     Pulses: Normal pulses.     Heart sounds: Normal heart sounds.  Pulmonary:     Effort: Pulmonary effort is normal.     Breath sounds: Examination of the right-middle field reveals rhonchi and rales. Examination of the right-lower field reveals rhonchi. Rhonchi and rales present.  Abdominal:     General: Bowel sounds are normal. There is no distension.     Palpations: Abdomen is soft.     Tenderness: There is no abdominal tenderness.  Musculoskeletal:     Cervical back: Neck supple.     Right lower leg: Edema present.     Left lower leg: Edema present.     Comments: Non pitting  Lymphadenopathy:     Cervical: No cervical adenopathy.  Skin:    General: Skin is warm and dry.     Capillary Refill: Capillary refill takes less than 2 seconds.  Neurological:     General: No focal deficit present.     Mental Status: She is alert and oriented to person, place, and time.  Psychiatric:        Mood and Affect: Mood normal.        Behavior: Behavior normal.     Labs reviewed: Basic Metabolic Panel: Recent Labs    12/11/21 0000 05/08/22 1344 05/22/22 0000  NA 132* 141 138  K 4.3 4.6 4.6  CL 93* 101 101  CO2 29* 27 27*  GLUCOSE  --  90  --   BUN '13 15 19  '$ CREATININE 1.0 0.93 0.9  CALCIUM 9.2 9.4 9.5  TSH  --   --  1.75   Liver Function Tests: Recent Labs    12/11/21 0000  AST 36*  ALT 19  ALKPHOS 57  ALBUMIN 3.9   No results for input(s): "LIPASE", "AMYLASE" in the last 8760 hours. No results for input(s):  "AMMONIA" in the last 8760 hours. CBC: Recent Labs    12/11/21 0000  WBC 3.0  HGB 13.0  HCT 39  PLT 209   Lipid Panel: Recent Labs    05/22/22 0000  CHOL 152  HDL 56  LDLCALC 73  TRIG 113   TSH: Recent Labs    05/22/22 0000  TSH 1.75   A1C: Lab Results  Component Value Date   HGBA1C 5.5 06/16/2014     Assessment/Plan 1. Acute cough - non productive cough, mild sob with fatigue x 2 weeks, afebrile, no weight fluctuations - diminished lung sounds/rales to right lobe - ? CHF exacerbation versus PNA - on spironolactone - DG Chest 2 View  Total time: 21 minutes. Greater than 50% of total time spent doing patient education regarding non productive cough including symptom/medication treatment.    Next appt: Visit date not found  [facility] [phone]

## 2022-08-02 NOTE — Patient Instructions (Signed)
Please go to [facility] for chest xray - [address] [place]  Will call with results when available

## 2022-08-03 ENCOUNTER — Other Ambulatory Visit: Payer: Self-pay | Admitting: Orthopedic Surgery

## 2022-08-03 DIAGNOSIS — R051 Acute cough: Secondary | ICD-10-CM

## 2022-08-03 MED ORDER — DOXYCYCLINE HYCLATE 100 MG PO TABS: 100.0000 mg | ORAL_TABLET | Freq: Two times a day (BID) | ORAL | 0 refills | Status: DC

## 2022-08-03 MED ORDER — DOXYCYCLINE HYCLATE 100 MG PO TABS: 100.0000 mg | ORAL_TABLET | Freq: Two times a day (BID) | ORAL | 0 refills | Status: AC

## 2022-08-03 NOTE — Progress Notes (Signed)
Chest xray unremarkable. Patient still coughing> 2 weeks. Prescription sent into [facility] for URI. Patient called with results and agrees with treatment plan.

## 2022-09-16 NOTE — Progress Notes (Signed)
Location:  [facility]  POS: Clinic  Provider: Royal Hawthorn, ANP  Code Status: DNR Goals of Care:     09/17/2022    2:43 PM  Advanced Directives  Does Patient Have a Medical Advance Directive? Yes  Type of Paramedic of [place];Living will;Out of facility DNR (pink MOST or yellow form)  Does patient want to make changes to medical advance directive? No - Patient declined  Copy of [place] in Chart? Yes - validated most recent copy scanned in chart (See row information)     Chief Complaint  Patient presents with   Medical Management of Chronic Issues    3 month follow up and medication refill    Quality Metric Gaps    Discussed the need for AWV and Covid vaccine     HPI: Patient is a 89 y.o. female seen today for medical management of HTN  HTN: improved, no edema, chest pain or sob.   She has been followed by cardiology for HTN and chronic HFpEF  Repeat echo 05/08/22 showed EF 55-60% with severe LFH and mildly impaired relaxation. No valvular issues.   LDL 73 TSH 1.75  05/22/22  BUN 19 CR 0.9 05/22/22  Has been walking and lost 6 lbs Walking with a walker No falls Denies depression or anxiety Still drives  Wt Readings from Last 3 Encounters:  09/17/22 270 lb (122.5 kg)  08/02/22 270 lb 6 oz (122.6 kg)  06/18/22 274 lb (124.3 kg)   Prescribed iron because she used to donate blood frequently and did have post op anemia  Has not taken in a few weeks  Dexa: 05/10/22 T score 0.5 Past Medical History:  Diagnosis Date   Arthritis    Cancer ([place])    basal cell skin cancer   Concussion    Dyspnea    upon exertion   Hypertension    Hypothyroidism    Stress incontinence    Total knee replacement status, bilateral     Past Surgical History:  Procedure Laterality Date   ABDOMINAL HYSTERECTOMY     APPENDECTOMY     CARPAL TUNNEL RELEASE     rt x2   COLONOSCOPY     EYE SURGERY Bilateral    cataract surgery  with lens implant   FINGER AMPUTATION Right    little finger   I & D EXTREMITY  03/11/2012   Procedure: MINOR IRRIGATION AND DEBRIDEMENT EXTREMITY;  Surgeon: Cammie Sickle., MD;  Location: [facility];  Service: Orthopedics;  Laterality: Right;  Right little finger   KNEE ARTHROSCOPY     bilat    REPLACEMENT TOTAL KNEE BILATERAL     REVERSE SHOULDER ARTHROPLASTY Right 09/04/2018   Procedure: REVERSE SHOULDER ARTHROPLASTY;  Surgeon: Tania Ade, MD;  Location: [place];  Service: Orthopedics;  Laterality: Right;   TONSILLECTOMY     TOTAL KNEE REVISION Right 03/14/2015   Procedure: TIBIAL REVISION, OPEN REDUCTION INTERNAL FIXATION PROXIMAL TIBIA;  Surgeon: Dorna Leitz, MD;  Location: [place];  Service: Orthopedics;  Laterality: Right;   TOTAL KNEE REVISION Left 07/01/2020   Procedure: TOTAL KNEE REVISION;  Surgeon: Dorna Leitz, MD;  Location: [facility];  Service: Orthopedics;  Laterality: Left;   TRIGGER FINGER RELEASE     TUBAL LIGATION      Allergies  Allergen Reactions   Phenergan [Promethazine Hcl] Other (See Comments)    hallucinations   Tizanidine     delirium   Lisinopril Cough  Outpatient Encounter Medications as of 09/17/2022  Medication Sig   amLODipine (NORVASC) 2.5 MG tablet Take 1 tablet (2.5 mg total) by mouth daily.   aspirin EC 81 MG tablet Take 81 mg by mouth daily.   Calcium Carb-Cholecalciferol (CALCIUM 600/VITAMIN D3 PO) Take 1 tablet by mouth daily.   Cholecalciferol (VITAMIN D-3) 1000 units CAPS Take 1,000 Units by mouth daily.    ferrous sulfate 325 (65 FE) MG tablet Take 325 mg by mouth 3 (three) times a week.   folic acid (FOLVITE) Q000111Q MCG tablet Take 800 mcg by mouth daily.    levothyroxine (SYNTHROID) 125 MCG tablet TAKE 1 TABLET BY MOUTH DAILY BEFORE BREAKFAST.   metoprolol tartrate (LOPRESSOR) 25 MG tablet TAKE 1 TABLET (25 MG TOTAL) BY MOUTH 2 (TWO) TIMES DAILY WITH A MEAL.   Multiple Vitamins-Minerals (PRESERVISION AREDS PO) Take 1  capsule by mouth in the morning and at bedtime.   pravastatin (PRAVACHOL) 80 MG tablet TAKE 1 TABLET BY MOUTH EVERY DAY   SPIKEVAX syringe Inject 0.5 mLs into the muscle once.   spironolactone (ALDACTONE) 25 MG tablet Take 0.5 tablets (12.5 mg total) by mouth daily.   traZODone (DESYREL) 50 MG tablet Take 1 tablet (50 mg total) by mouth at bedtime.   valsartan (DIOVAN) 320 MG tablet Take 320 mg by mouth daily.   vitamin C (ASCORBIC ACID) 500 MG tablet Take 500 mg by mouth daily.   No facility-administered encounter medications on file as of 09/17/2022.    Review of Systems:  Review of Systems  Constitutional:  Negative for activity change, appetite change, chills, diaphoresis, fatigue, fever and unexpected weight change.  HENT:  Negative for congestion.   Respiratory:  Negative for cough, shortness of breath and wheezing.   Cardiovascular:  Negative for chest pain, palpitations and leg swelling.  Gastrointestinal:  Negative for abdominal distention, abdominal pain, constipation and diarrhea.  Genitourinary:  Negative for difficulty urinating and dysuria.  Musculoskeletal:  Positive for gait problem (uses walker). Negative for arthralgias, back pain, joint swelling and myalgias.  Neurological:  Negative for dizziness, tremors, seizures, syncope, facial asymmetry, speech difficulty, weakness, light-headedness, numbness and headaches.  Psychiatric/Behavioral:  Negative for agitation, behavioral problems and confusion.     Health Maintenance  Topic Date Due   Medicare Annual Wellness (AWV)  06/07/2021   COVID-19 Vaccine (4 - 2023-24 season) 10/03/2022 (Originally 07/25/2022)   Pneumonia Vaccine 98+ Years old (3 of 3 - PPSV23 or PCV20) 10/29/2022 (Originally 02/28/2020)   DTaP/Tdap/Td (3 - Td or Tdap) 02/26/2030   INFLUENZA VACCINE  Completed   DEXA SCAN  Completed   Zoster Vaccines- Shingrix  Completed   HPV VACCINES  Aged Out    Physical Exam: Vitals:   09/17/22 1444  BP: 132/66   Pulse: 69  Resp: 17  Temp: 97.7 F (36.5 C)  TempSrc: Temporal  SpO2: 97%  Weight: 270 lb (122.5 kg)  Height: 5' 4"$  (1.626 m)   Body mass index is 46.35 kg/m. Wt Readings from Last 3 Encounters:  09/17/22 270 lb (122.5 kg)  08/02/22 270 lb 6 oz (122.6 kg)  06/18/22 274 lb (124.3 kg)    Physical Exam Vitals and nursing note reviewed.  Constitutional:      General: She is not in acute distress.    Appearance: She is not diaphoretic.  HENT:     Head: Normocephalic and atraumatic.     Right Ear: Tympanic membrane and ear canal normal.     Left Ear: Tympanic membrane  and ear canal normal.     Nose: Nose normal.     Mouth/Throat:     Mouth: Mucous membranes are moist.     Pharynx: Oropharynx is clear.  Eyes:     Conjunctiva/sclera: Conjunctivae normal.     Pupils: Pupils are equal, round, and reactive to light.  Neck:     Vascular: No JVD.  Cardiovascular:     Rate and Rhythm: Normal rate and regular rhythm.     Heart sounds: No murmur heard. Pulmonary:     Effort: Pulmonary effort is normal. No respiratory distress.     Breath sounds: Normal breath sounds. No wheezing.  Abdominal:     General: Bowel sounds are normal. There is no distension.     Palpations: Abdomen is soft.     Tenderness: There is no abdominal tenderness.  Musculoskeletal:     Cervical back: No rigidity or tenderness.  Lymphadenopathy:     Cervical: No cervical adenopathy.  Skin:    General: Skin is warm and dry.  Neurological:     Mental Status: She is alert and oriented to person, place, and time.  Psychiatric:        Mood and Affect: Mood normal.     Labs reviewed: Basic Metabolic Panel: Recent Labs    12/11/21 0000 05/08/22 1344 05/22/22 0000  NA 132* 141 138  K 4.3 4.6 4.6  CL 93* 101 101  CO2 29* 27 27*  GLUCOSE  --  90  --   BUN 13 15 19  $ CREATININE 1.0 0.93 0.9  CALCIUM 9.2 9.4 9.5  TSH  --   --  1.75   Liver Function Tests: Recent Labs    12/11/21 0000  AST 36*   ALT 19  ALKPHOS 57  ALBUMIN 3.9   No results for input(s): "LIPASE", "AMYLASE" in the last 8760 hours. No results for input(s): "AMMONIA" in the last 8760 hours. CBC: Recent Labs    12/11/21 0000  WBC 3.0  HGB 13.0  HCT 39  PLT 209   Lipid Panel: Recent Labs    05/22/22 0000  CHOL 152  HDL 56  LDLCALC 73  TRIG 113   Lab Results  Component Value Date   HGBA1C 5.5 06/16/2014    Procedures since last visit: No results found.  Assessment/Plan 1. Essential hypertension Controlled Continue lopressor, Norvasc, and Diovan  2. Chronic diastolic CHF Has mild DOE which is unchanged.  Weight coming down On aldactone.  Followed by cardiology   3. Morbid obesity Walking more and losing weight  4. Anemia Hgb has been stable in the past Not currently taking iron  Will d/c from list and have her f/u with a CBC  5. Insomnia Continue trazodone  6. Hypothyroidism Continue synthroid   7. HLD Continue pravachol  Labs/tests ordered:  * No order type specified * CBC BMP Next appt:  3 months    Total time 36mn:  time greater than 50% of total time spent doing pt counseling and coordination of care

## 2022-09-17 ENCOUNTER — Encounter: Payer: Self-pay | Admitting: Adult Health

## 2022-09-17 ENCOUNTER — Non-Acute Institutional Stay: Payer: Medicare Other | Admitting: Adult Health

## 2022-09-17 VITALS — BP 132/66 | HR 69 | Temp 97.7°F | Resp 17 | Ht 64.0 in | Wt 270.0 lb

## 2022-09-17 DIAGNOSIS — E039 Hypothyroidism, unspecified: Secondary | ICD-10-CM | POA: Diagnosis not present

## 2022-09-17 DIAGNOSIS — I5032 Chronic diastolic (congestive) heart failure: Secondary | ICD-10-CM

## 2022-09-17 DIAGNOSIS — F5101 Primary insomnia: Secondary | ICD-10-CM

## 2022-09-17 DIAGNOSIS — D649 Anemia, unspecified: Secondary | ICD-10-CM

## 2022-09-17 DIAGNOSIS — E7800 Pure hypercholesterolemia, unspecified: Secondary | ICD-10-CM

## 2022-09-17 DIAGNOSIS — I10 Essential (primary) hypertension: Secondary | ICD-10-CM

## 2022-09-17 MED ORDER — LEVOTHYROXINE SODIUM 125 MCG PO TABS: 125.0000 ug | ORAL_TABLET | Freq: Every day | ORAL | 1 refills | Status: DC

## 2022-09-19 DIAGNOSIS — L820 Inflamed seborrheic keratosis: Secondary | ICD-10-CM | POA: Diagnosis not present

## 2022-09-19 DIAGNOSIS — Z85828 Personal history of other malignant neoplasm of skin: Secondary | ICD-10-CM | POA: Diagnosis not present

## 2022-09-19 DIAGNOSIS — L304 Erythema intertrigo: Secondary | ICD-10-CM | POA: Diagnosis not present

## 2022-09-19 DIAGNOSIS — L570 Actinic keratosis: Secondary | ICD-10-CM | POA: Diagnosis not present

## 2022-09-19 DIAGNOSIS — L821 Other seborrheic keratosis: Secondary | ICD-10-CM | POA: Diagnosis not present

## 2022-10-06 ENCOUNTER — Encounter: Payer: Self-pay | Admitting: Cardiovascular Disease

## 2022-10-06 NOTE — Progress Notes (Unsigned)
Cardiology Office Note   Date:  10/06/2022   ID:  Samantha Stone, Labbe 1931-06-01, MRN [id number]  PCP:  Virgie Dad, MD  Cardiologist:   [name], MD   Chief Complaint  Patient presents with   Congestive Heart Failure        Hypertension        1. Hypertension 2. Possible TIAs  - mental status changes , difficulty talking.   3. Hypothyroidism 4. Hyperlipidemia 5.      Samantha Stone is an 89 yo who I have known for many years.  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    Samantha Stone has fallen several times over the past several months.  She slipped on the wet floor on the ceramic tile.    She presents for further management of her HTN.    Her BP has been irregular at home.   Her diet is fairly consistent.  She does not eat much salt.  She has frozen lasagna on occasion.  Hot dog once a month or so.    She was admitted to [facility] in March, 2015 for mental confusion and HTN Echo showed:  Left ventricle: The cavity size was normal. Wall thickness was at the upper limits of normal. Systolic function was normal. The estimated ejection fraction was in the range of 60% to 65%. Wall motion was normal; there were no regional wall motion abnormalities. Doppler parameters are consistent with abnormal left ventricular relaxation (grade 1 diastolic dysfunction). - Mitral valve: Mild regurgitation. - Left atrium: The atrium was moderately to severely dilated. - Pulmonary arteries: Systolic pressure was mildly increased. PA peak pressure: 62m Hg    Oct. 20, 2015:  EGriffin Stone doing well.  She had her cataracts done.    No CP or dyspnea.    Feb. 19, 2016:   Samantha CREDEURis a 89y.o. female who presents for her HTN and hyperlipidemia. Family issues,  - DMarden Noblewas placed in a nursing home.  Step daughter challenged.  Has had a rough 3-4 months.   Has not been exerciseing, diet has not been as strict recently.   Oct. 9, 2017:  EGriffin Stone seen today for follow up visit Doing  well Has had a chronic cough.   Was diagnosed with CHF - she had an echo in 2015 that showed normal LV systolic function and grade 1 diastolic dysfunction   Has signficant DOE  - gets DOE walking to her mailbox  She was started on Lasix 10 mg a day but has not noticed any increase in her urine output.  Jan. 24, 2018:  Still has a cough.    Is now on Lasix 60 mg a day  Has been to ENT  Going to see Dr. WMelvyn Novassoon .  BP has been elevated.   Oct. 23, 2018: EGriffin Stone seen today for follow up of her chronic diastolic congestive heart failure Still has a chronic cough.  Slightly better recently  Has low stamina  Doing better on Lasix 20 mg a day instead of 40 mg   June 10, 2018: EGriffin Stone is seen today for follow-up of her chronic diastolic congestive heart failure. [facility] , wears her Fitbit.     Wt today is 264 lbs.  Gets fatigued  Had a cough for years but now it has resolved.  BP has been normal recently . Is a bit high today   July 07, 2019:  EGriffin Stone  is seen today for follow-up visit.   Has some DOE walking from her car to her apt.  Admits that she is out of shape and weighs too much .   January 22, 2022: Samantha Stone is seen today for follow up of her chronic diastolic CHF Wt is [id number] lbs  BP has been higher  Has gained weight  Not much exercise since May when she had COVID   October 09, 2022 Samantha Stone is seen today for follow up of her HTN and chronic diastolic CHF  Wt is ***   Past Medical History:  Diagnosis Date   Arthritis    Cancer ([place])    basal cell skin cancer   Concussion    Dyspnea    upon exertion   Hypertension    Hypothyroidism    Stress incontinence    Total knee replacement status, bilateral     Past Surgical History:  Procedure Laterality Date   ABDOMINAL HYSTERECTOMY     APPENDECTOMY     CARPAL TUNNEL RELEASE     rt x2   COLONOSCOPY     EYE SURGERY Bilateral    cataract surgery with lens implant   FINGER AMPUTATION  Right    little finger   I & D EXTREMITY  03/11/2012   Procedure: MINOR IRRIGATION AND DEBRIDEMENT EXTREMITY;  Surgeon: Cammie Sickle., MD;  Location: [place];  Service: Orthopedics;  Laterality: Right;  Right little finger   KNEE ARTHROSCOPY     bilat    REPLACEMENT TOTAL KNEE BILATERAL     REVERSE SHOULDER ARTHROPLASTY Right 09/04/2018   Procedure: REVERSE SHOULDER ARTHROPLASTY;  Surgeon: Tania Ade, MD;  Location: [place];  Service: Orthopedics;  Laterality: Right;   TONSILLECTOMY     TOTAL KNEE REVISION Right 03/14/2015   Procedure: TIBIAL REVISION, OPEN REDUCTION INTERNAL FIXATION PROXIMAL TIBIA;  Surgeon: Dorna Leitz, MD;  Location: [place];  Service: Orthopedics;  Laterality: Right;   TOTAL KNEE REVISION Left 07/01/2020   Procedure: TOTAL KNEE REVISION;  Surgeon: Dorna Leitz, MD;  Location: [facility];  Service: Orthopedics;  Laterality: Left;   TRIGGER FINGER RELEASE     TUBAL LIGATION       Current Outpatient Medications  Medication Sig Dispense Refill   amLODipine (NORVASC) 2.5 MG tablet Take 1 tablet (2.5 mg total) by mouth daily. 90 tablet 3   aspirin EC 81 MG tablet Take 81 mg by mouth daily.     Calcium Carb-Cholecalciferol (CALCIUM 600/VITAMIN D3 PO) Take 1 tablet by mouth daily.     Cholecalciferol (VITAMIN D-3) 1000 units CAPS Take 1,000 Units by mouth daily.      folic acid (FOLVITE) [id number] MCG tablet Take 800 mcg by mouth daily.      levothyroxine (SYNTHROID) 125 MCG tablet Take 1 tablet (125 mcg total) by mouth daily before breakfast. 90 tablet 1   metoprolol tartrate (LOPRESSOR) 25 MG tablet TAKE 1 TABLET (25 MG TOTAL) BY MOUTH 2 (TWO) TIMES DAILY WITH A MEAL. 180 tablet 2   Multiple Vitamins-Minerals (PRESERVISION AREDS PO) Take 1 capsule by mouth in the morning and at bedtime.     pravastatin (PRAVACHOL) 80 MG tablet TAKE 1 TABLET BY MOUTH EVERY DAY 90 tablet 3   SPIKEVAX syringe Inject 0.5 mLs into the muscle once.     spironolactone (ALDACTONE) 25  MG tablet Take 0.5 tablets (12.5 mg total) by mouth daily. 45 tablet 3   traZODone (DESYREL) 50 MG tablet Take 1 tablet (50 mg  total) by mouth at bedtime. 90 tablet 1   valsartan (DIOVAN) 320 MG tablet Take 320 mg by mouth daily.     vitamin C (ASCORBIC ACID) 500 MG tablet Take 500 mg by mouth daily.     No current facility-administered medications for this visit.    Allergies:   Phenergan [promethazine hcl], Tizanidine, and Lisinopril    Social History:  The patient  reports that she has never smoked. She has never used smokeless tobacco. She reports current alcohol use of about 2.0 standard drinks of alcohol per week. She reports that she does not use drugs.   Family History:  The patient's family history includes Bladder Cancer in her father.    ROS:  Please see the history of present illness.    Physical Exam: There were no vitals taken for this visit.  No BP recorded.  {Refresh Note OR Click here to enter BP  :1}***    GEN:  Well nourished, well developed in no acute distress HEENT: Normal NECK: No JVD; No carotid bruits LYMPHATICS: No lymphadenopathy CARDIAC: RRR ***, no murmurs, rubs, gallops RESPIRATORY:  Clear to auscultation without rales, wheezing or rhonchi  ABDOMEN: Soft, non-tender, non-distended MUSCULOSKELETAL:  No edema; No deformity  SKIN: Warm and dry NEUROLOGIC:  Alert and oriented x 3     EKG:        Recent Labs: 12/11/2021: ALT 19; Hemoglobin 13.0; Platelets 209 05/22/2022: BUN 19; Creatinine 0.9; Potassium 4.6; Sodium 138; TSH 1.75    Lipid Panel    Component Value Date/Time   CHOL 152 05/22/2022 0000   CHOL 191 05/21/2017 1128   TRIG 113 05/22/2022 0000   HDL 56 05/22/2022 0000   HDL 48 05/21/2017 1128   CHOLHDL 4.0 05/21/2017 1128   CHOLHDL 2.8 06/16/2014 0429   VLDL 12 06/16/2014 0429   LDLCALC 73 05/22/2022 0000   LDLCALC 105 (H) 05/21/2017 1128      Wt Readings from Last 3 Encounters:  09/17/22 270 lb (122.5 kg)  08/02/22 270  lb 6 oz (122.6 kg)  06/18/22 274 lb (124.3 kg)      Other studies Reviewed: Additional studies/ records that were reviewed today include: . Review of the above records demonstrates:    ASSESSMENT AND PLAN:  1. Hypertension -         2. Possible TIAs  -   3. Hypothyroidism -    4. Hyperlipidemia -     5.  DOE, cough / chronic diastolic CHF-          Current medicines are reviewed at length with the patient today.  The patient does not have concerns regarding medicines.  The following changes have been made:  no change     Signed, [name], MD  10/06/2022 1:37 PM    [facility] [place], [place], [place]  [address] Phone: [phone]; Fax: [phone]

## 2022-10-09 ENCOUNTER — Encounter: Payer: Self-pay | Admitting: Cardiovascular Disease

## 2022-10-09 ENCOUNTER — Ambulatory Visit: Payer: Medicare Other | Attending: Cardiovascular Disease | Admitting: Cardiovascular Disease

## 2022-10-09 VITALS — BP 124/70 | HR 94 | Ht 64.0 in | Wt 271.0 lb

## 2022-10-09 DIAGNOSIS — E782 Mixed hyperlipidemia: Secondary | ICD-10-CM | POA: Diagnosis not present

## 2022-10-09 DIAGNOSIS — I10 Essential (primary) hypertension: Secondary | ICD-10-CM | POA: Diagnosis not present

## 2022-10-09 NOTE — Patient Instructions (Signed)
Medication Instructions:  Your physician recommends that you continue on your current medications as directed. Please refer to the Current Medication list given to you today.  *If you need a refill on your cardiac medications before your next appointment, please call your pharmacy*   Lab Work: NONE If you have labs (blood work) drawn today and your tests are completely normal, you will receive your results only by: MyChart Message (if you have MyChart) OR A paper copy in the mail If you have any lab test that is abnormal or we need to change your treatment, we will call you to review the results.   Testing/Procedures: NONE   Follow-Up: At [facility], you and your health needs are our priority.  As part of our continuing mission to provide you with exceptional heart care, we have created designated Provider Care Teams.  These Care Teams include your primary Cardiologist (physician) and Advanced Practice Providers (APPs -  Physician Assistants and Nurse Practitioners) who all work together to provide you with the care you need, when you need it.  We recommend signing up for the patient portal called "MyChart".  Sign up information is provided on this After Visit Summary.  MyChart is used to connect with patients for Virtual Visits (Telemedicine).  Patients are able to view lab/test results, encounter notes, upcoming appointments, etc.  Non-urgent messages can be sent to your provider as well.   To learn more about what you can do with MyChart, go to [email or web address].    Your next appointment:   1 year(s)  Provider:   Philip Nahser, MD   

## 2022-11-22 DIAGNOSIS — H353132 Nonexudative age-related macular degeneration, bilateral, intermediate dry stage: Secondary | ICD-10-CM | POA: Diagnosis not present

## 2022-12-18 ENCOUNTER — Encounter: Payer: Medicare Other | Admitting: Internal Medicine

## 2022-12-18 DIAGNOSIS — D649 Anemia, unspecified: Secondary | ICD-10-CM | POA: Diagnosis not present

## 2022-12-18 DIAGNOSIS — I10 Essential (primary) hypertension: Secondary | ICD-10-CM | POA: Diagnosis not present

## 2022-12-18 LAB — BASIC METABOLIC PANEL
BUN: 16 (ref 4–21)
CO2: 27 — AB (ref 13–22)
Chloride: 99 (ref 99–108)
Creatinine: 1 (ref 0.5–1.1)
Glucose: 93
Potassium: 4.5 mEq/L (ref 3.5–5.1)
Sodium: 135 — AB (ref 137–147)

## 2022-12-18 LAB — CBC AND DIFFERENTIAL
HCT: 41 (ref 36–46)
Hemoglobin: 13.6 (ref 12.0–16.0)
Platelets: 268 10*3/uL (ref 150–400)
WBC: 5.8

## 2022-12-18 LAB — COMPREHENSIVE METABOLIC PANEL
Calcium: 9.6 (ref 8.7–10.7)
eGFR: 54

## 2022-12-18 LAB — CBC: RBC: 4.65 (ref 3.87–5.11)

## 2022-12-25 ENCOUNTER — Encounter: Payer: Self-pay | Admitting: Internal Medicine

## 2022-12-25 ENCOUNTER — Non-Acute Institutional Stay: Payer: Medicare Other | Admitting: Internal Medicine

## 2022-12-25 VITALS — BP 132/66 | HR 74 | Temp 97.8°F | Resp 18 | Ht 64.0 in | Wt 272.0 lb

## 2022-12-25 DIAGNOSIS — I10 Essential (primary) hypertension: Secondary | ICD-10-CM | POA: Diagnosis not present

## 2022-12-25 DIAGNOSIS — I5032 Chronic diastolic (congestive) heart failure: Secondary | ICD-10-CM

## 2022-12-25 DIAGNOSIS — F5101 Primary insomnia: Secondary | ICD-10-CM

## 2022-12-25 DIAGNOSIS — E7800 Pure hypercholesterolemia, unspecified: Secondary | ICD-10-CM | POA: Diagnosis not present

## 2022-12-25 DIAGNOSIS — D649 Anemia, unspecified: Secondary | ICD-10-CM

## 2022-12-25 DIAGNOSIS — E039 Hypothyroidism, unspecified: Secondary | ICD-10-CM

## 2022-12-25 NOTE — Progress Notes (Signed)
Location:  [facility] features editor of Service:  Clinic (12)  Provider:   Code Status:  Goals of Care:     12/25/2022    9:21 AM  Advanced Directives  Does Patient Have a Medical Advance Directive? Yes  Type of Advance Directive Out of facility DNR (pink MOST or yellow form);Living will;Healthcare Power of Attorney  Does patient want to make changes to medical advance directive? No - Patient declined  Copy of Healthcare Power of Attorney in Chart? No - copy requested     Chief Complaint  Patient presents with   Medical Management of Chronic Issues    3 month follow up    HPI: Patient is a 89 y.o. female seen today for medical management of chronic diseases.   [place]   Patient has a history of left total knee revision by Dr. Luiz Blare on 07/01/2020 History of chronic diastolic CHF, hyperlipidemia, and hypothyroidism   HTN Doing well Overweight Very sedentary Wt Readings from Last 3 Encounters:  12/25/22 272 lb (123.4 kg)  10/09/22 271 lb (122.9 kg)  09/17/22 270 lb (122.5 kg)    Uses walker now Insomnia On Trazodone Continues to do well otherwise No Complains today Retired Scientist, forensic    Past Medical History:  Diagnosis Date   Arthritis    Cancer ([facility])    basal cell skin cancer   Concussion    Dyspnea    upon exertion   Hypertension    Hypothyroidism    Stress incontinence    Total knee replacement status, bilateral     Past Surgical History:  Procedure Laterality Date   ABDOMINAL HYSTERECTOMY     APPENDECTOMY     CARPAL TUNNEL RELEASE     rt x2   COLONOSCOPY     EYE SURGERY Bilateral    cataract surgery with lens implant   FINGER AMPUTATION Right    little finger   I & D EXTREMITY  03/11/2012   Procedure: MINOR IRRIGATION AND DEBRIDEMENT EXTREMITY;  Surgeon: Wyn Forster., MD;  Location: [facility];  Service: Orthopedics;  Laterality: Right;  Right little finger   KNEE ARTHROSCOPY     bilat     REPLACEMENT TOTAL KNEE BILATERAL     REVERSE SHOULDER ARTHROPLASTY Right 09/04/2018   Procedure: REVERSE SHOULDER ARTHROPLASTY;  Surgeon: Jones Broom, MD;  Location: [facility];  Service: Orthopedics;  Laterality: Right;   TONSILLECTOMY     TOTAL KNEE REVISION Right 03/14/2015   Procedure: TIBIAL REVISION, OPEN REDUCTION INTERNAL FIXATION PROXIMAL TIBIA;  Surgeon: Jodi Geralds, MD;  Location: [facility];  Service: Orthopedics;  Laterality: Right;   TOTAL KNEE REVISION Left 07/01/2020   Procedure: TOTAL KNEE REVISION;  Surgeon: Jodi Geralds, MD;  Location: [facility];  Service: Orthopedics;  Laterality: Left;   TRIGGER FINGER RELEASE     TUBAL LIGATION      Allergies  Allergen Reactions   Phenergan [Promethazine Hcl] Other (See Comments)    hallucinations   Tizanidine     delirium   Lisinopril Cough    Outpatient Encounter Medications as of 12/25/2022  Medication Sig   amLODipine (NORVASC) 2.5 MG tablet Take 1 tablet (2.5 mg total) by mouth daily.   aspirin EC 81 MG tablet Take 81 mg by mouth daily.   Calcium Carb-Cholecalciferol (CALCIUM 600/VITAMIN D3 PO) Take 1 tablet by mouth daily.   Cholecalciferol (VITAMIN D-3) 1000 units CAPS Take 1,000 Units by mouth daily.  folic acid (FOLVITE) 800 MCG tablet Take 800 mcg by mouth daily.    levothyroxine (SYNTHROID) 125 MCG tablet Take 1 tablet (125 mcg total) by mouth daily before breakfast.   metoprolol tartrate (LOPRESSOR) 25 MG tablet TAKE 1 TABLET (25 MG TOTAL) BY MOUTH 2 (TWO) TIMES DAILY WITH A MEAL.   Multiple Vitamins-Minerals (PRESERVISION AREDS PO) Take 1 capsule by mouth in the morning and at bedtime.   pravastatin (PRAVACHOL) 80 MG tablet TAKE 1 TABLET BY MOUTH EVERY DAY   spironolactone (ALDACTONE) 25 MG tablet Take 0.5 tablets (12.5 mg total) by mouth daily.   traZODone (DESYREL) 50 MG tablet Take 1 tablet (50 mg total) by mouth at bedtime.   valsartan (DIOVAN) 320 MG tablet Take 320 mg by mouth daily.   vitamin C (ASCORBIC ACID) 500 MG  tablet Take 500 mg by mouth daily.   No facility-administered encounter medications on file as of 12/25/2022.    Review of Systems:  Review of Systems  Constitutional:  Negative for activity change and appetite change.  HENT: Negative.    Respiratory:  Negative for cough and shortness of breath.   Cardiovascular:  Positive for leg swelling.  Gastrointestinal:  Negative for constipation.  Genitourinary: Negative.   Musculoskeletal:  Positive for gait problem. Negative for arthralgias and myalgias.  Skin: Negative.   Neurological:  Negative for dizziness and weakness.  Psychiatric/Behavioral:  Negative for confusion, dysphoric mood and sleep disturbance.     Health Maintenance  Topic Date Due   Pneumonia Vaccine 76+ Years old (3 of 3 - PPSV23 or PCV20) 02/28/2020   Medicare Annual Wellness (AWV)  06/07/2021   COVID-19 Vaccine (4 - 2023-24 season) 07/25/2022   INFLUENZA VACCINE  02/28/2023   DTaP/Tdap/Td (3 - Td or Tdap) 02/26/2030   DEXA SCAN  Completed   Zoster Vaccines- Shingrix  Completed   HPV VACCINES  Aged Out    Physical Exam: Vitals:   12/25/22 0919  BP: 132/66  Pulse: 74  Resp: 18  Temp: 97.8 F (36.6 C)  TempSrc: Temporal  SpO2: 94%  Weight: 272 lb (123.4 kg)  Height: 5\' 4"  (1.626 m)   Body mass index is 46.69 kg/m. Physical Exam Vitals reviewed.  Constitutional:      Appearance: Normal appearance.  HENT:     Head: Normocephalic.     Nose: Nose normal.     Mouth/Throat:     Mouth: Mucous membranes are moist.     Pharynx: Oropharynx is clear.  Eyes:     Pupils: Pupils are equal, round, and reactive to light.  Cardiovascular:     Rate and Rhythm: Normal rate and regular rhythm.     Pulses: Normal pulses.     Heart sounds: Normal heart sounds. No murmur heard. Pulmonary:     Effort: Pulmonary effort is normal.     Breath sounds: Normal breath sounds.  Abdominal:     General: Abdomen is flat. Bowel sounds are normal.     Palpations: Abdomen is  soft.  Musculoskeletal:        General: No swelling.     Cervical back: Neck supple.     Comments: Mild edema with Chronic Venous changes  Skin:    General: Skin is warm.  Neurological:     General: No focal deficit present.     Mental Status: She is alert and oriented to person, place, and time.  Psychiatric:        Mood and Affect: Mood normal.  Thought Content: Thought content normal.     Labs reviewed: Basic Metabolic Panel: Recent Labs    05/08/22 1344 05/22/22 0000 12/18/22 0000  NA 141 138 135*  K 4.6 4.6 4.5  CL 101 101 99  CO2 27 27* 27*  GLUCOSE 90  --   --   BUN 15 19 16   CREATININE 0.93 0.9 1.0  CALCIUM 9.4 9.5 9.6  TSH  --  1.75  --    Liver Function Tests: No results for input(s): "AST", "ALT", "ALKPHOS", "BILITOT", "PROT", "ALBUMIN" in the last 8760 hours. No results for input(s): "LIPASE", "AMYLASE" in the last 8760 hours. No results for input(s): "AMMONIA" in the last 8760 hours. CBC: Recent Labs    12/18/22 0000  WBC 5.8  HGB 13.6  HCT 41  PLT 268   Lipid Panel: Recent Labs    05/22/22 0000  CHOL 152  HDL 56  LDLCALC 73  TRIG 113   Lab Results  Component Value Date   HGBA1C 5.5 06/16/2014    Procedures since last visit: No results found.  Assessment/Plan 1. Essential hypertension BP controlled on Diovan And Norvasc and Lopressor Also on Aldactone 2. Hypothyroidism, unspecified type TSH normal in 10/23  3. Chronic diastolic congestive heart failure ([facility]CC) Low dose of Aldactone  4. Morbid (severe) obesity due to excess calories (HCC) Encourage Walking  5. Pure hypercholesterolemia statin  6. Anemia, unspecified type HGB stable  7. Primary insomnia Trazodone    Labs/tests ordered:  * No order type specified * Next appt:  Visit date not found

## 2023-01-14 ENCOUNTER — Other Ambulatory Visit: Payer: Self-pay | Admitting: Internal Medicine

## 2023-02-11 ENCOUNTER — Non-Acute Institutional Stay (INDEPENDENT_AMBULATORY_CARE_PROVIDER_SITE_OTHER): Payer: Medicare Other | Admitting: Adult Health

## 2023-02-11 ENCOUNTER — Encounter: Payer: Self-pay | Admitting: Adult Health

## 2023-02-11 VITALS — BP 138/86 | HR 67 | Temp 97.8°F | Resp 17 | Ht 64.0 in | Wt 273.0 lb

## 2023-02-11 DIAGNOSIS — Z0000 Encounter for general adult medical examination without abnormal findings: Secondary | ICD-10-CM

## 2023-02-11 NOTE — Patient Instructions (Signed)
Samantha Stone , Thank you for taking time to come for your Medicare Wellness Visit. I appreciate your ongoing commitment to your health goals. Please review the following plan we discussed and let me know if I can assist you in the future.   Screening recommendations/referrals: Colonoscopy aged out Mammogram aged out Bone Density Due in 2025 Recommended yearly ophthalmology/optometry visit for glaucoma screening and checkup Recommended yearly dental visit for hygiene and checkup  Vaccinations: Influenza vaccine- due annually in September/October Pneumococcal vaccine up to date Tdap vaccine up to date Shingles vaccine up to date    Advanced directives: reviewed   Conditions/risks identified: fall risk   Next appointment: 1 year   Preventive Care 44 Years and Older, Female Preventive care refers to lifestyle choices and visits with your health care provider that can promote health and wellness. What does preventive care include? A yearly physical exam. This is also called an annual well check. Dental exams once or twice a year. Routine eye exams. Ask your health care provider how often you should have your eyes checked. Personal lifestyle choices, including: Daily care of your teeth and gums. Regular physical activity. Eating a healthy diet. Avoiding tobacco and drug use. Limiting alcohol use. Practicing safe sex. Taking low-dose aspirin every day. Taking vitamin and mineral supplements as recommended by your health care provider. What happens during an annual well check? The services and screenings done by your health care provider during your annual well check will depend on your age, overall health, lifestyle risk factors, and family history of disease. Counseling  Your health care provider may ask you questions about your: Alcohol use. Tobacco use. Drug use. Emotional well-being. Home and relationship well-being. Sexual activity. Eating habits. History of  falls. Memory and ability to understand (cognition). Work and work Astronomer. Reproductive health. Screening  You may have the following tests or measurements: Height, weight, and BMI. Blood pressure. Lipid and cholesterol levels. These may be checked every 5 years, or more frequently if you are over 26 years old. Skin check. Lung cancer screening. You may have this screening every year starting at age 80 if you have a 30-pack-year history of smoking and currently smoke or have quit within the past 15 years. Fecal occult blood test (FOBT) of the stool. You may have this test every year starting at age 29. Flexible sigmoidoscopy or colonoscopy. You may have a sigmoidoscopy every 5 years or a colonoscopy every 10 years starting at age 53. Hepatitis C blood test. Hepatitis B blood test. Sexually transmitted disease (STD) testing. Diabetes screening. This is done by checking your blood sugar (glucose) after you have not eaten for a while (fasting). You may have this done every 1-3 years. Bone density scan. This is done to screen for osteoporosis. You may have this done starting at age 75. Mammogram. This may be done every 1-2 years. Talk to your health care provider about how often you should have regular mammograms. Talk with your health care provider about your test results, treatment options, and if necessary, the need for more tests. Vaccines  Your health care provider may recommend certain vaccines, such as: Influenza vaccine. This is recommended every year. Tetanus, diphtheria, and acellular pertussis (Tdap, Td) vaccine. You may need a Td booster every 10 years. Zoster vaccine. You may need this after age 49. Pneumococcal 13-valent conjugate (PCV13) vaccine. One dose is recommended after age 13. Pneumococcal polysaccharide (PPSV23) vaccine. One dose is recommended after age 19. Talk to your health care provider  about which screenings and vaccines you need and how often you need  them. This information is not intended to replace advice given to you by your health care provider. Make sure you discuss any questions you have with your health care provider. Document Released: 08/12/2015 Document Revised: 04/04/2016 Document Reviewed: 05/17/2015 Elsevier Interactive Patient Education  2017 [employer].  Fall Prevention in the Home Falls can cause injuries. They can happen to people of all ages. There are many things you can do to make your home safe and to help prevent falls. What can I do on the outside of my home? Regularly fix the edges of walkways and driveways and fix any cracks. Remove anything that might make you trip as you walk through a door, such as a raised step or threshold. Trim any bushes or trees on the path to your home. Use bright outdoor lighting. Clear any walking paths of anything that might make someone trip, such as rocks or tools. Regularly check to see if handrails are loose or broken. Make sure that both sides of any steps have handrails. Any raised decks and porches should have guardrails on the edges. Have any leaves, snow, or ice cleared regularly. Use sand or salt on walking paths during winter. Clean up any spills in your garage right away. This includes oil or grease spills. What can I do in the bathroom? Use night lights. Install grab bars by the toilet and in the tub and shower. Do not use towel bars as grab bars. Use non-skid mats or decals in the tub or shower. If you need to sit down in the shower, use a plastic, non-slip stool. Keep the floor dry. Clean up any water that spills on the floor as soon as it happens. Remove soap buildup in the tub or shower regularly. Attach bath mats securely with double-sided non-slip rug tape. Do not have throw rugs and other things on the floor that can make you trip. What can I do in the bedroom? Use night lights. Make sure that you have a light by your bed that is easy to reach. Do not use  any sheets or blankets that are too big for your bed. They should not hang down onto the floor. Have a firm chair that has side arms. You can use this for support while you get dressed. Do not have throw rugs and other things on the floor that can make you trip. What can I do in the kitchen? Clean up any spills right away. Avoid walking on wet floors. Keep items that you use a lot in easy-to-reach places. If you need to reach something above you, use a strong step stool that has a grab bar. Keep electrical cords out of the way. Do not use floor polish or wax that makes floors slippery. If you must use wax, use non-skid floor wax. Do not have throw rugs and other things on the floor that can make you trip. What can I do with my stairs? Do not leave any items on the stairs. Make sure that there are handrails on both sides of the stairs and use them. Fix handrails that are broken or loose. Make sure that handrails are as long as the stairways. Check any carpeting to make sure that it is firmly attached to the stairs. Fix any carpet that is loose or worn. Avoid having throw rugs at the top or bottom of the stairs. If you do have throw rugs, attach them to the floor  with carpet tape. Make sure that you have a light switch at the top of the stairs and the bottom of the stairs. If you do not have them, ask someone to add them for you. What else can I do to help prevent falls? Wear shoes that: Do not have high heels. Have rubber bottoms. Are comfortable and fit you well. Are closed at the toe. Do not wear sandals. If you use a stepladder: Make sure that it is fully opened. Do not climb a closed stepladder. Make sure that both sides of the stepladder are locked into place. Ask someone to hold it for you, if possible. Clearly mark and make sure that you can see: Any grab bars or handrails. First and last steps. Where the edge of each step is. Use tools that help you move around (mobility aids)  if they are needed. These include: Canes. Walkers. Scooters. Crutches. Turn on the lights when you go into a dark area. Replace any light bulbs as soon as they burn out. Set up your furniture so you have a clear path. Avoid moving your furniture around. If any of your floors are uneven, fix them. If there are any pets around you, be aware of where they are. Review your medicines with your doctor. Some medicines can make you feel dizzy. This can increase your chance of falling. Ask your doctor what other things that you can do to help prevent falls. This information is not intended to replace advice given to you by your health care provider. Make sure you discuss any questions you have with your health care provider. Document Released: 05/12/2009 Document Revised: 12/22/2015 Document Reviewed: 08/20/2014 Elsevier Interactive Patient Education  2017 [employer].

## 2023-02-11 NOTE — Progress Notes (Signed)
Subjective:   Samantha Stone is a 89 y.o. female who presents for Medicare Annual (Subsequent) preventive examination at [facility].  Visit Complete: In person  Patient Medicare AWV questionnaire was completed by the patient on 02/11/23; I have confirmed that all information answered by patient is correct and no changes since this date.  Review of Systems           Objective:    Today's Vitals   02/11/23 1452  BP: 138/86  Pulse: 67  Resp: 17  Temp: 97.8 F (36.6 C)  TempSrc: Temporal  SpO2: 94%  Weight: 273 lb (123.8 kg)  Height: 5\' 4"  (1.626 m)   Body mass index is 46.86 kg/m.     02/11/2023    2:48 PM 12/25/2022    9:21 AM 09/17/2022    2:43 PM 08/02/2022   10:59 AM 05/22/2022    1:05 PM 01/12/2022    2:13 PM 01/10/2022    8:04 AM  Advanced Directives  Does Patient Have a Medical Advance Directive? Yes Yes Yes Yes Yes Yes Yes  Type of Advance Directive Out of facility DNR (pink MOST or yellow form);Living will;Healthcare Power of Attorney Out of facility DNR (pink MOST or yellow form);Living will;Healthcare Power of [employer] of [place];Living will;Out of facility DNR (pink MOST or yellow form) Healthcare Power of [place];Living will;Out of facility DNR (pink MOST or yellow form) Living will;Out of facility DNR (pink MOST or yellow form);Healthcare Power of Attorney Living will;Out of facility DNR (pink MOST or yellow form) Out of facility DNR (pink MOST or yellow form);Living will  Does patient want to make changes to medical advance directive? No - Patient declined No - Patient declined No - Patient declined No - Patient declined No - Patient declined No - Patient declined No - Patient declined  Copy of Healthcare Power of Attorney in Chart? No - copy requested No - copy requested Yes - validated most recent copy scanned in chart (See row information) Yes - validated most recent copy scanned in chart (See row information) Yes -  validated most recent copy scanned in chart (See row information)    Pre-existing out of facility DNR order (yellow form or pink MOST form)      Yellow form placed in chart (order not valid for inpatient use) Yellow form placed in chart (order not valid for inpatient use)    Current Medications (verified) Outpatient Encounter Medications as of 02/11/2023  Medication Sig   amLODipine (NORVASC) 2.5 MG tablet Take 1 tablet (2.5 mg total) by mouth daily.   aspirin EC 81 MG tablet Take 81 mg by mouth daily.   Calcium Carb-Cholecalciferol (CALCIUM 600/VITAMIN D3 PO) Take 1 tablet by mouth daily.   Cholecalciferol (VITAMIN D-3) 1000 units CAPS Take 1,000 Units by mouth daily.    folic acid (FOLVITE) 800 MCG tablet Take 800 mcg by mouth daily.    levothyroxine (SYNTHROID) 125 MCG tablet Take 1 tablet (125 mcg total) by mouth daily before breakfast.   metoprolol tartrate (LOPRESSOR) 25 MG tablet TAKE 1 TABLET (25 MG TOTAL) BY MOUTH 2 (TWO) TIMES DAILY WITH A MEAL.   Multiple Vitamins-Minerals (PRESERVISION AREDS PO) Take 1 capsule by mouth in the morning and at bedtime.   pravastatin (PRAVACHOL) 80 MG tablet TAKE 1 TABLET BY MOUTH EVERY DAY   spironolactone (ALDACTONE) 25 MG tablet Take 0.5 tablets (12.5 mg total) by mouth daily.   traZODone (DESYREL) 50 MG tablet TAKE ONE TABLET EVERY DAY  AT BEDTIME   valsartan (DIOVAN) 320 MG tablet Take 320 mg by mouth daily.   vitamin C (ASCORBIC ACID) 500 MG tablet Take 500 mg by mouth daily.   No facility-administered encounter medications on file as of 02/11/2023.    Allergies (verified) Phenergan [promethazine hcl], Tizanidine, and Lisinopril   History: Past Medical History:  Diagnosis Date   Arthritis    Cancer (HCC)    basal cell skin cancer   Concussion    Dyspnea    upon exertion   Hypertension    Hypothyroidism    Stress incontinence    Total knee replacement status, bilateral    Past Surgical History:  Procedure Laterality Date    ABDOMINAL HYSTERECTOMY     APPENDECTOMY     CARPAL TUNNEL RELEASE     rt x2   COLONOSCOPY     EYE SURGERY Bilateral    cataract surgery with lens implant   FINGER AMPUTATION Right    little finger   I & D EXTREMITY  03/11/2012   Procedure: MINOR IRRIGATION AND DEBRIDEMENT EXTREMITY;  Surgeon: Wyn Forster., MD;  Location: [facility];  Service: Orthopedics;  Laterality: Right;  Right little finger   KNEE ARTHROSCOPY     bilat    REPLACEMENT TOTAL KNEE BILATERAL     REVERSE SHOULDER ARTHROPLASTY Right 09/04/2018   Procedure: REVERSE SHOULDER ARTHROPLASTY;  Surgeon: Jones Broom, MD;  Location: [facility];  Service: Orthopedics;  Laterality: Right;   TONSILLECTOMY     TOTAL KNEE REVISION Right 03/14/2015   Procedure: TIBIAL REVISION, OPEN REDUCTION INTERNAL FIXATION PROXIMAL TIBIA;  Surgeon: Jodi Geralds, MD;  Location: [facility] OR;  Service: Orthopedics;  Laterality: Right;   TOTAL KNEE REVISION Left 07/01/2020   Procedure: TOTAL KNEE REVISION;  Surgeon: Jodi Geralds, MD;  Location: [facility];  Service: Orthopedics;  Laterality: Left;   TRIGGER FINGER RELEASE     TUBAL LIGATION     Family History  Problem Relation Age of Onset   Bladder Cancer Father    Social History   Socioeconomic History   Marital status: Widowed    Spouse name: Not on file   Number of children: 4   Years of education: Not on file   Highest education level: Not on file  Occupational History   Occupation: Retired Charity fundraiser  Tobacco Use   Smoking status: Never   Smokeless tobacco: Never  Vaping Use   Vaping status: Never Used  Substance and Sexual Activity   Alcohol use: Yes    Alcohol/week: 2.0 standard drinks of alcohol    Types: 2 Cans of beer per week    Comment: weekly   Drug use: No   Sexual activity: Not on file  Other Topics Concern   Not on file  Social History Narrative   Tobacco use, amount per day now: NEVER   Past tobacco use, amount per day:   How many years did you use tobacco:    Alcohol use (drinks per week): BEER COUPLE A DAY   Diet: REGULAR   Do you drink/eat things with caffeine: COFFEE ONCE A DAY   Marital status:   WIDOW                               What year were you married? 1ST TIME 1956 DIED 2ND 1987 DIED 03-09-14   Do you live in a house, apartment, assisted living, condo, trailer, etc.? APARTMENT  Is it one or more stories? ONE   How many persons live in your home? ONE   Do you have pets in your home?( please list) DOG MAGGIE DAUSCHAUND   Current or past profession: RN   Do you exercise?        NOT OFTEN ENOUGH                          Type and how often? BEFORE SHOULDER SURG, I WALKED AND SWAM   Do you have a living will? YES   Do you have a DNR form?   YES                                If not, do you want to discuss one?   Do you have signed POA/HPOA forms?      YES                   If so, please bring to you appointment   Social Determinants of Health   Financial Resource Strain: Not on file  Food Insecurity: Not on file  Transportation Needs: Not on file  Physical Activity: Not on file  Stress: Not on file  Social Connections: Not on file    Tobacco Counseling Counseling given: Not Answered   Clinical Intake:     Pain : No/denies pain                  Activities of Daily Living    02/11/2023    2:48 PM  In your present state of health, do you have any difficulty performing the following activities:  Hearing? 0  Vision? 0  Difficulty concentrating or making decisions? 0  Walking or climbing stairs? 0  Dressing or bathing? 0  Doing errands, shopping? 0    Patient Care Team: Mahlon Gammon, MD as PCP - General (Internal Medicine) Nahser, Deloris Ping, MD as PCP - Cardiology (Cardiology) Jones Broom, MD as Consulting Physician (Orthopedic Surgery) Nahser, Deloris Ping, MD as Consulting Physician (Cardiology) Mia Creek, MD as Consulting Physician (Ophthalmology) Bjorn Pippin, MD as Attending Physician  (Urology) Jodi Geralds, MD as Consulting Physician (Orthopedic Surgery) Nyoka Cowden, MD as Consulting Physician (Pulmonary Disease)  Indicate any recent Medical Services you may have received from other than [facility] providers in the past year (date may be approximate).     Assessment:   This is a routine wellness examination for [place].  Hearing/Vision screen Hearing Screening - Comments:: Yes By Dr.Dunn this year  Dietary issues and exercise activities discussed:     Goals Addressed   None    Depression Screen    02/11/2023    2:48 PM 12/25/2022    9:21 AM 09/17/2022    3:52 PM 09/17/2022    2:43 PM 08/02/2022   10:58 AM 06/18/2022    1:52 PM 05/22/2022    1:04 PM  PHQ 2/9 Scores  PHQ - 2 Score 0 0 0 0 0 0 0    Fall Risk    02/11/2023    2:47 PM 12/25/2022    9:21 AM 09/17/2022    2:43 PM 08/02/2022   10:58 AM 06/18/2022    1:52 PM  Fall Risk   Falls in the past year? 0 0 0 0 0  Number falls in past yr: 0 0 0 1 0  Injury with Fall? 0 0 0  0 0  Risk for fall due to : No Fall Risks Impaired mobility;Impaired balance/gait No Fall Risks No Fall Risks No Fall Risks  Follow up Falls evaluation completed  Falls evaluation completed Falls evaluation completed Falls evaluation completed    MEDICARE RISK AT HOME:   TIMED UP AND GO:  Was the test performed?  Yes  Length of time to ambulate 10 feet: 12 sec Gait slow and steady without use of assistive device    Cognitive Function:    02/11/2023    2:49 PM  MMSE - Mini Mental State Exam  Orientation to time 5  Orientation to Place 5  Registration 3  Attention/ Calculation 5  Recall 1  Language- name 2 objects 2  Language- repeat 1  Language- follow 3 step command 3  Language- read & follow direction 1  Write a sentence 1  Copy design 1  Total score 28        06/07/2020   10:12 AM 06/04/2019   10:04 AM  6CIT Screen  What Year? 0 points 0 points  What month? 0 points 0 points  What time? 0 points 0 points   Count back from 20 0 points 0 points  Months in reverse 0 points 0 points  Repeat phrase 0 points 0 points  Total Score 0 points 0 points    Immunizations Immunization History  Administered Date(s) Administered   Fluad Quad(high Dose 65+) 05/02/2021, 05/01/2022   Influenza Whole 03/30/2016   Influenza, High Dose Seasonal PF 04/17/2018, 03/28/2019, 05/20/2020   Moderna SARS-COV2 Booster Vaccination 01/13/2021, 05/30/2022   Moderna Sars-Covid-2 Vaccination 08/11/2019, 09/08/2019, 06/14/2020   Pneumococcal Conjugate-13 02/28/2015   Pneumococcal Polysaccharide-23 07/30/1996   Td 02/28/2011   Tdap 02/27/2020   Zoster Recombinant(Shingrix) 03/31/2019, 06/12/2019   Zoster, Live 08/30/2006    TDAP status: Up to date  Flu Vaccine status: Up to date  Pneumococcal vaccine status: Up to date  Covid-19 vaccine status: Information provided on how to obtain vaccines.   Qualifies for Shingles Vaccine? Yes   Zostavax completed Yes   Shingrix Completed?: Yes  Screening Tests Health Maintenance  Topic Date Due   Pneumonia Vaccine 37+ Years old (3 of 3 - PPSV23 or PCV20) 02/28/2020   Medicare Annual Wellness (AWV)  06/07/2021   COVID-19 Vaccine (4 - 2023-24 season) 07/25/2022   INFLUENZA VACCINE  02/28/2023   DTaP/Tdap/Td (3 - Td or Tdap) 02/26/2030   DEXA SCAN  Completed   Zoster Vaccines- Shingrix  Completed   HPV VACCINES  Aged Out    Health Maintenance  Health Maintenance Due  Topic Date Due   Pneumonia Vaccine 36+ Years old (3 of 3 - PPSV23 or PCV20) 02/28/2020   Medicare Annual Wellness (AWV)  06/07/2021   COVID-19 Vaccine (4 - 2023-24 season) 07/25/2022    Colorectal cancer screening: No longer required.   Mammogram status: No longer required due to age.  Bone Density status: Completed 2023. Results reflect: Bone density results: NORMAL. Repeat every 2 years.  Lung Cancer Screening: (Low Dose CT Chest recommended if Age 1-80 years, 20 pack-year currently smoking OR  have quit w/in 15years.) does not qualify.   Lung Cancer Screening Referral: na  Additional Screening:  Hepatitis C Screening: does not qualify; Completed na  Vision Screening: Recommended annual ophthalmology exams for early detection of glaucoma and other disorders of the eye. Is the patient up to date with their annual eye exam?  Yes  Who is the provider or what is the name  of the office in which the patient attends annual eye exams? Has a hx of macular degeneration Dr Shea Evans If pt is not established with a provider, would they like to be referred to a provider to establish care? No .   Dental Screening: Recommended annual dental exams for proper oral hygiene  Diabetic Foot Exam: Diabetic Foot Exam: Completed na  Community Resource Referral / Chronic Care Management: CRR required this visit?  No   CCM required this visit?  No     Plan:     I have personally reviewed and noted the following in the patient's chart:   Medical and social history Use of alcohol, tobacco or illicit drugs  Current medications and supplements including opioid prescriptions. Patient is not currently taking opioid prescriptions. Functional ability and status Nutritional status Physical activity Advanced directives List of other physicians Hospitalizations, surgeries, and ER visits in previous 12 months Vitals Screenings to include cognitive, depression, and falls Referrals and appointments  In addition, I have reviewed and discussed with patient certain preventive protocols, quality metrics, and best practice recommendations. A written personalized care plan for preventive services as well as general preventive health recommendations were provided to patient.     Fletcher Anon, NP   02/11/2023   After Visit Summary: handed to pt  Nurse Notes: NA

## 2023-02-26 ENCOUNTER — Other Ambulatory Visit: Payer: Self-pay | Admitting: Internal Medicine

## 2023-02-26 DIAGNOSIS — E7800 Pure hypercholesterolemia, unspecified: Secondary | ICD-10-CM

## 2023-03-20 ENCOUNTER — Other Ambulatory Visit: Payer: Self-pay | Admitting: Adult Health

## 2023-03-20 DIAGNOSIS — E039 Hypothyroidism, unspecified: Secondary | ICD-10-CM

## 2023-04-02 ENCOUNTER — Other Ambulatory Visit: Payer: Self-pay | Admitting: Internal Medicine

## 2023-04-02 ENCOUNTER — Other Ambulatory Visit: Payer: Self-pay | Admitting: Nurse Practitioner

## 2023-04-03 ENCOUNTER — Other Ambulatory Visit: Payer: Self-pay | Admitting: Internal Medicine

## 2023-04-03 NOTE — Telephone Encounter (Signed)
Patient has requested refill on medication. Patient medication has High Risk Warnings. Medication pend and sent to PCP Mahlon Gammon, MD

## 2023-05-27 ENCOUNTER — Encounter: Payer: Self-pay | Admitting: Adult Health

## 2023-05-27 ENCOUNTER — Non-Acute Institutional Stay: Payer: Medicare Other | Admitting: Adult Health

## 2023-05-27 VITALS — BP 134/70 | HR 71 | Temp 97.6°F | Resp 16 | Ht 64.0 in | Wt 277.0 lb

## 2023-05-27 DIAGNOSIS — E039 Hypothyroidism, unspecified: Secondary | ICD-10-CM | POA: Diagnosis not present

## 2023-05-27 DIAGNOSIS — I10 Essential (primary) hypertension: Secondary | ICD-10-CM | POA: Diagnosis not present

## 2023-05-27 DIAGNOSIS — I5032 Chronic diastolic (congestive) heart failure: Secondary | ICD-10-CM | POA: Diagnosis not present

## 2023-05-27 DIAGNOSIS — D649 Anemia, unspecified: Secondary | ICD-10-CM

## 2023-05-27 DIAGNOSIS — E7800 Pure hypercholesterolemia, unspecified: Secondary | ICD-10-CM

## 2023-05-27 NOTE — Patient Instructions (Addendum)
Recommend Flu, Prevnar 20 (pna vaccine) and covid vaccine  We will call you for an apt to have your blood work drawn next week

## 2023-05-27 NOTE — Progress Notes (Signed)
Location:  [facility]  POS: Clinic  Provider: Fletcher Anon, ANP  Code Status: DNR Goals of Care:     05/27/2023    1:02 PM  Advanced Directives  Does Patient Have a Medical Advance Directive? Yes  Type of Advance Directive Out of facility DNR (pink MOST or yellow form)  Does patient want to make changes to medical advance directive? No - Patient declined     Chief Complaint  Patient presents with   Medical Management of Chronic Issues    Patient is being seen for a follow up. Discuss a possible mole on face    Immunizations    Patient is due for pneumonia vaccine , covid and flu vaccine .     HPI: Patient is a 89 y.o. female seen today for medical management   PMH significant for HTN, depression, obesity, HLD, hypothyroidism, diastolic CHF, OA  Reports some memory loss noticing more Her daughter does her finances. She remains able to manage her home, perform ADLs, oriented and pleasant.   HTN: improved, no edema, chest pain or sob.   She has been followed by cardiology for HTN and chronic HFpEF  Repeat echo 05/08/22 showed EF 55-60% with severe LFH and mildly impaired relaxation. No valvular issues.   LDL 73 TSH 1.75    BUN 16 CR 1.0     Walking with a walker three times a week No falls Denies depression or anxiety   Wt Readings from Last 3 Encounters:  05/27/23 277 lb (125.6 kg)  02/11/23 273 lb (123.8 kg)  12/25/22 272 lb (123.4 kg)   Off iron Hgb 13.6 Hct 41  Dexa: 05/10/22 T score 0.5 No longer getting mammograms. Past Medical History:  Diagnosis Date   Arthritis    Cancer (HCC)    basal cell skin cancer   Concussion    Dyspnea    upon exertion   Hypertension    Hypothyroidism    Stress incontinence    Total knee replacement status, bilateral     Past Surgical History:  Procedure Laterality Date   ABDOMINAL HYSTERECTOMY     APPENDECTOMY     CARPAL TUNNEL RELEASE     rt x2   COLONOSCOPY     EYE SURGERY Bilateral    cataract  surgery with lens implant   FINGER AMPUTATION Right    little finger   I & D EXTREMITY  03/11/2012   Procedure: MINOR IRRIGATION AND DEBRIDEMENT EXTREMITY;  Surgeon: Wyn Forster., MD;  Location: [facility];  Service: Orthopedics;  Laterality: Right;  Right little finger   KNEE ARTHROSCOPY     bilat    REPLACEMENT TOTAL KNEE BILATERAL     REVERSE SHOULDER ARTHROPLASTY Right 09/04/2018   Procedure: REVERSE SHOULDER ARTHROPLASTY;  Surgeon: Jones Broom, MD;  Location: [facility];  Service: Orthopedics;  Laterality: Right;   TONSILLECTOMY     TOTAL KNEE REVISION Right 03/14/2015   Procedure: TIBIAL REVISION, OPEN REDUCTION INTERNAL FIXATION PROXIMAL TIBIA;  Surgeon: Jodi Geralds, MD;  Location: [facility];  Service: Orthopedics;  Laterality: Right;   TOTAL KNEE REVISION Left 07/01/2020   Procedure: TOTAL KNEE REVISION;  Surgeon: Jodi Geralds, MD;  Location: [facility];  Service: Orthopedics;  Laterality: Left;   TRIGGER FINGER RELEASE     TUBAL LIGATION      Allergies  Allergen Reactions   Phenergan [Promethazine Hcl] Other (See Comments)    hallucinations   Tizanidine     delirium   Lisinopril  Cough    Outpatient Encounter Medications as of 05/27/2023  Medication Sig   amLODipine (NORVASC) 2.5 MG tablet Take 1 tablet (2.5 mg total) by mouth daily.   aspirin EC 81 MG tablet Take 81 mg by mouth daily.   Calcium Carb-Cholecalciferol (CALCIUM 600/VITAMIN D3 PO) Take 1 tablet by mouth daily.   Cholecalciferol (VITAMIN D-3) 1000 units CAPS Take 1,000 Units by mouth daily.    folic acid (FOLVITE) 800 MCG tablet Take 800 mcg by mouth daily.    levothyroxine (SYNTHROID) 125 MCG tablet Take 1 tablet (125 mcg total) by mouth daily before breakfast.   metoprolol tartrate (LOPRESSOR) 25 MG tablet TAKE ONE TABLET TWICE DAILY WITH MEALS   Multiple Vitamins-Minerals (PRESERVISION AREDS PO) Take 1 capsule by mouth in the morning and at bedtime.   pravastatin (PRAVACHOL) 80 MG tablet TAKE ONE  TABLET EVERY DAY   spironolactone (ALDACTONE) 25 MG tablet TAKE 1/2 TABLET EVERY DAY   traZODone (DESYREL) 50 MG tablet TAKE ONE TABLET EVERY DAY AT BEDTIME   valsartan (DIOVAN) 320 MG tablet TAKE ONE TABLET EVERY DAY   vitamin C (ASCORBIC ACID) 500 MG tablet Take 500 mg by mouth daily.   No facility-administered encounter medications on file as of 05/27/2023.    Review of Systems:  Review of Systems  Constitutional:  Negative for activity change, appetite change, chills, diaphoresis, fatigue, fever and unexpected weight change.  HENT:  Negative for congestion.   Respiratory:  Negative for cough, shortness of breath and wheezing.   Cardiovascular:  Negative for chest pain, palpitations and leg swelling.  Gastrointestinal:  Negative for abdominal distention, abdominal pain, constipation and diarrhea.  Genitourinary:  Negative for difficulty urinating and dysuria.  Musculoskeletal:  Positive for gait problem (uses walker). Negative for arthralgias, back pain, joint swelling and myalgias.  Neurological:  Negative for dizziness, tremors, seizures, syncope, facial asymmetry, speech difficulty, weakness, light-headedness, numbness and headaches.  Psychiatric/Behavioral:  Negative for agitation, behavioral problems and confusion.     Health Maintenance  Topic Date Due   Pneumonia Vaccine 69+ Years old (3 of 3 - PPSV23 or PCV20) 02/28/2020   INFLUENZA VACCINE  02/28/2023   COVID-19 Vaccine (4 - 2023-24 season) 03/31/2023   Medicare Annual Wellness (AWV)  02/11/2024   DTaP/Tdap/Td (3 - Td or Tdap) 02/26/2030   DEXA SCAN  Completed   Zoster Vaccines- Shingrix  Completed   HPV VACCINES  Aged Out    Physical Exam: Vitals:   05/27/23 1259  BP: 134/70  Pulse: 71  Resp: 16  Temp: 97.6 F (36.4 C)  TempSrc: Temporal  SpO2: 94%  Weight: 277 lb (125.6 kg)  Height: 5\' 4"  (1.626 m)    Body mass index is 47.55 kg/m. Wt Readings from Last 3 Encounters:  05/27/23 277 lb (125.6 kg)   02/11/23 273 lb (123.8 kg)  12/25/22 272 lb (123.4 kg)    Physical Exam Vitals and nursing note reviewed.  Constitutional:      General: She is not in acute distress.    Appearance: She is not diaphoretic.  HENT:     Head: Normocephalic and atraumatic.     Right Ear: Tympanic membrane and ear canal normal.     Left Ear: Tympanic membrane and ear canal normal.     Nose: Nose normal.     Mouth/Throat:     Mouth: Mucous membranes are moist.     Pharynx: Oropharynx is clear.  Eyes:     Conjunctiva/sclera: Conjunctivae normal.  Pupils: Pupils are equal, round, and reactive to light.  Neck:     Vascular: No JVD.  Cardiovascular:     Rate and Rhythm: Normal rate and regular rhythm.     Heart sounds: No murmur heard. Pulmonary:     Effort: Pulmonary effort is normal. No respiratory distress.     Breath sounds: Normal breath sounds. No wheezing.  Abdominal:     General: Bowel sounds are normal. There is no distension.     Palpations: Abdomen is soft.     Tenderness: There is no abdominal tenderness.  Musculoskeletal:     Cervical back: No rigidity or tenderness.  Lymphadenopathy:     Cervical: No cervical adenopathy.  Skin:    General: Skin is warm and dry.  Neurological:     Mental Status: She is alert and oriented to person, place, and time.  Psychiatric:        Mood and Affect: Mood normal.     Labs reviewed: Basic Metabolic Panel: Recent Labs    12/18/22 0000  NA 135*  K 4.5  CL 99  CO2 27*  BUN 16  CREATININE 1.0  CALCIUM 9.6   Liver Function Tests: No results for input(s): "AST", "ALT", "ALKPHOS", "BILITOT", "PROT", "ALBUMIN" in the last 8760 hours.  No results for input(s): "LIPASE", "AMYLASE" in the last 8760 hours. No results for input(s): "AMMONIA" in the last 8760 hours. CBC: Recent Labs    12/18/22 0000  WBC 5.8  HGB 13.6  HCT 41  PLT 268   Lipid Panel: No results for input(s): "CHOL", "HDL", "LDLCALC", "TRIG", "CHOLHDL", "LDLDIRECT" in  the last 8760 hours.  Lab Results  Component Value Date   HGBA1C 5.5 06/16/2014    Procedures since last visit: No results found.  Assessment/Plan 1. Essential hypertension Controlled Continue lopressor, Norvasc, and Diovan  2. Chronic diastolic CHF Has mild DOE which is unchanged.  Weight trending up, possibly due to intake as she has no edema or change in DOE On aldactone.  Followed by cardiology   3. Morbid obesity Weight trending up Encourage diet and exercise management.   4. Anemia Off iron Recheck CBC  5. Insomnia Continue trazodone  6. Hypothyroidism Continue synthroid   7. HLD Continue pravachol  8. Memory loss Mild    02/11/2023    2:49 PM  MMSE - Mini Mental State Exam  Orientation to time 5  Orientation to Place 5  Registration 3  Attention/ Calculation 5  Recall 1  Language- name 2 objects 2  Language- repeat 1  Language- follow 3 step command 3  Language- read & follow direction 1  Write a sentence 1  Copy design 1  Total score 28      Labs/tests ordered:  * No order type specified * CBC CMP TSH Lipid in am and then CBC BMP in 6 months  Next appt:  6 months    Total time :  time greater than 50% of total time spent doing pt counseling and coordination of care

## 2023-05-30 ENCOUNTER — Other Ambulatory Visit: Payer: Self-pay | Admitting: Internal Medicine

## 2023-05-30 DIAGNOSIS — I10 Essential (primary) hypertension: Secondary | ICD-10-CM | POA: Diagnosis not present

## 2023-05-30 DIAGNOSIS — E7800 Pure hypercholesterolemia, unspecified: Secondary | ICD-10-CM | POA: Diagnosis not present

## 2023-05-30 LAB — BASIC METABOLIC PANEL
BUN: 17 (ref 4–21)
CO2: 29 — AB (ref 13–22)
Chloride: 97 — AB (ref 99–108)
Creatinine: 1.1 (ref 0.5–1.1)
Glucose: 101
Potassium: 4.8 meq/L (ref 3.5–5.1)
Sodium: 135 — AB (ref 137–147)

## 2023-05-30 LAB — LIPID PANEL
Cholesterol: 151 (ref 0–200)
HDL: 61 (ref 35–70)
LDL Cholesterol: 72
Triglycerides: 87 (ref 40–160)

## 2023-05-30 LAB — CBC AND DIFFERENTIAL
HCT: 38 (ref 36–46)
Hemoglobin: 12.7 (ref 12.0–16.0)
Platelets: 241 10*3/uL (ref 150–400)
WBC: 7.4

## 2023-05-30 LAB — COMPREHENSIVE METABOLIC PANEL
Albumin: 3.9 (ref 3.5–5.0)
Calcium: 9.7 (ref 8.7–10.7)
Globulin: 2.3

## 2023-05-30 LAB — HEPATIC FUNCTION PANEL
ALT: 17 U/L (ref 7–35)
AST: 20 (ref 13–35)
Alkaline Phosphatase: 66 (ref 25–125)
Bilirubin, Total: 0.7

## 2023-05-30 LAB — CBC: RBC: 4.28 (ref 3.87–5.11)

## 2023-05-30 LAB — TSH: TSH: 1.59 (ref 0.41–5.90)

## 2023-05-31 ENCOUNTER — Telehealth: Payer: Self-pay | Admitting: Adult Health

## 2023-05-31 NOTE — Telephone Encounter (Signed)
Please call patient and let her know that her CBC CMP TSH and lipid panel are all normal. Kidneys and liver look good. Cholesterol is at goal and so she should keep taking her pravastatin as prescribed. These labs were viewed by Peggye Ley NP at [facility] and have been faxed to [facility] for abstraction.

## 2023-05-31 NOTE — Telephone Encounter (Signed)
Called and discussed labs with patient.

## 2023-06-03 ENCOUNTER — Emergency Department (HOSPITAL_BASED_OUTPATIENT_CLINIC_OR_DEPARTMENT_OTHER)
Admission: EM | Admit: 2023-06-03 | Discharge: 2023-06-03 | Disposition: A | Discharge status: Home / Self Care | Payer: Medicare Other | Attending: Emergency Medicine | Admitting: Emergency Medicine

## 2023-06-03 ENCOUNTER — Other Ambulatory Visit: Payer: Self-pay

## 2023-06-03 ENCOUNTER — Other Ambulatory Visit (HOSPITAL_BASED_OUTPATIENT_CLINIC_OR_DEPARTMENT_OTHER): Payer: Self-pay

## 2023-06-03 ENCOUNTER — Emergency Department (HOSPITAL_BASED_OUTPATIENT_CLINIC_OR_DEPARTMENT_OTHER): Payer: Medicare Other

## 2023-06-03 ENCOUNTER — Encounter (HOSPITAL_BASED_OUTPATIENT_CLINIC_OR_DEPARTMENT_OTHER): Payer: Self-pay | Admitting: Emergency Medicine

## 2023-06-03 DIAGNOSIS — R0602 Shortness of breath: Secondary | ICD-10-CM | POA: Diagnosis not present

## 2023-06-03 DIAGNOSIS — Z20822 Contact with and (suspected) exposure to covid-19: Secondary | ICD-10-CM | POA: Insufficient documentation

## 2023-06-03 DIAGNOSIS — J40 Bronchitis, not specified as acute or chronic: Secondary | ICD-10-CM | POA: Insufficient documentation

## 2023-06-03 DIAGNOSIS — I10 Essential (primary) hypertension: Secondary | ICD-10-CM | POA: Diagnosis not present

## 2023-06-03 DIAGNOSIS — R9389 Abnormal findings on diagnostic imaging of other specified body structures: Secondary | ICD-10-CM | POA: Diagnosis not present

## 2023-06-03 DIAGNOSIS — Z7982 Long term (current) use of aspirin: Secondary | ICD-10-CM | POA: Diagnosis not present

## 2023-06-03 DIAGNOSIS — Z96611 Presence of right artificial shoulder joint: Secondary | ICD-10-CM | POA: Diagnosis not present

## 2023-06-03 DIAGNOSIS — Z79899 Other long term (current) drug therapy: Secondary | ICD-10-CM | POA: Insufficient documentation

## 2023-06-03 DIAGNOSIS — Z85828 Personal history of other malignant neoplasm of skin: Secondary | ICD-10-CM | POA: Insufficient documentation

## 2023-06-03 LAB — BASIC METABOLIC PANEL
Anion gap: 6 (ref 5–15)
BUN: 11 mg/dL (ref 8–23)
CO2: 30 mmol/L (ref 22–32)
Calcium: 9.9 mg/dL (ref 8.9–10.3)
Chloride: 96 mmol/L — ABNORMAL LOW (ref 98–111)
Creatinine, Ser: 0.96 mg/dL (ref 0.44–1.00)
GFR, Estimated: 56 mL/min — ABNORMAL LOW (ref >60)
Glucose, Bld: 140 mg/dL — ABNORMAL HIGH (ref 70–99)
Potassium: 4.4 mmol/L (ref 3.5–5.1)
Sodium: 132 mmol/L — ABNORMAL LOW (ref 135–145)

## 2023-06-03 LAB — CBC WITH DIFFERENTIAL/PLATELET
Abs Immature Granulocytes: 0.02 10*3/uL (ref 0.00–0.07)
Basophils Absolute: 0 10*3/uL (ref 0.0–0.1)
Basophils Relative: 0 %
Eosinophils Absolute: 0.2 10*3/uL (ref 0.0–0.5)
Eosinophils Relative: 2 %
HCT: 38 % (ref 36.0–46.0)
Hemoglobin: 12.6 g/dL (ref 12.0–15.0)
Immature Granulocytes: 0 %
Lymphocytes Relative: 23 %
Lymphs Abs: 1.7 10*3/uL (ref 0.7–4.0)
MCH: 29.4 pg (ref 26.0–34.0)
MCHC: 33.2 g/dL (ref 30.0–36.0)
MCV: 88.6 fL (ref 80.0–100.0)
Monocytes Absolute: 0.8 10*3/uL (ref 0.1–1.0)
Monocytes Relative: 10 %
Neutro Abs: 4.9 10*3/uL (ref 1.7–7.7)
Neutrophils Relative %: 65 %
Platelets: 257 10*3/uL (ref 150–400)
RBC: 4.29 MIL/uL (ref 3.87–5.11)
RDW: 13.8 % (ref 11.5–15.5)
WBC: 7.6 10*3/uL (ref 4.0–10.5)
nRBC: 0 % (ref 0.0–0.2)

## 2023-06-03 LAB — RESP PANEL BY RT-PCR (RSV, FLU A&B, COVID)  RVPGX2
Influenza A by PCR: NEGATIVE
Influenza B by PCR: NEGATIVE
Resp Syncytial Virus by PCR: NEGATIVE
SARS Coronavirus 2 by RT PCR: NEGATIVE

## 2023-06-03 LAB — TROPONIN I (HIGH SENSITIVITY): Troponin I (High Sensitivity): 8 ng/L (ref <18)

## 2023-06-03 LAB — BRAIN NATRIURETIC PEPTIDE: B Natriuretic Peptide: 189.5 pg/mL — ABNORMAL HIGH (ref 0.0–100.0)

## 2023-06-03 MED ORDER — ALBUTEROL SULFATE HFA 108 (90 BASE) MCG/ACT IN AERS
INHALATION_SPRAY | RESPIRATORY_TRACT | Status: AC
  Filled 2023-06-03: qty 6.7

## 2023-06-03 MED ORDER — PREDNISONE 20 MG PO TABS
40.0000 mg | ORAL_TABLET | Freq: Every day | ORAL | 0 refills | Status: AC
  Filled 2023-06-03: qty 8, 4d supply, fill #0

## 2023-06-03 MED ORDER — ALBUTEROL SULFATE (2.5 MG/3ML) 0.083% IN NEBU
2.5000 mg | INHALATION_SOLUTION | Freq: Once | RESPIRATORY_TRACT | Status: AC
  Administered 2023-06-03: 2.5 mg via RESPIRATORY_TRACT
  Filled 2023-06-03: qty 3

## 2023-06-03 MED ORDER — ALBUTEROL SULFATE HFA 108 (90 BASE) MCG/ACT IN AERS
2.0000 | INHALATION_SPRAY | RESPIRATORY_TRACT | Status: DC | PRN
  Administered 2023-06-03: 2 via RESPIRATORY_TRACT

## 2023-06-03 MED ORDER — AEROCHAMBER PLUS FLO-VU LARGE MISC
1.0000 | Freq: Once | Status: AC
  Administered 2023-06-03: 1
  Filled 2023-06-03: qty 1

## 2023-06-03 MED ORDER — PREDNISONE 50 MG PO TABS
60.0000 mg | ORAL_TABLET | Freq: Once | ORAL | Status: AC
  Administered 2023-06-03: 60 mg via ORAL
  Filled 2023-06-03: qty 1

## 2023-06-03 MED ORDER — IPRATROPIUM-ALBUTEROL 0.5-2.5 (3) MG/3ML IN SOLN
3.0000 mL | Freq: Once | RESPIRATORY_TRACT | Status: AC
  Administered 2023-06-03: 3 mL via RESPIRATORY_TRACT
  Filled 2023-06-03: qty 3

## 2023-06-03 MED ORDER — AZITHROMYCIN 250 MG PO TABS
250.0000 mg | ORAL_TABLET | Freq: Every day | ORAL | 0 refills | Status: DC
  Filled 2023-06-03: qty 6, 5d supply, fill #0

## 2023-06-03 NOTE — ED Provider Notes (Signed)
EMERGENCY DEPARTMENT AT [facility] Provider Note   CSN: [id number] Arrival date & time: 06/03/23  4098     History  Chief Complaint  Patient presents with   Shortness of Breath    Samantha Stone is a 89 y.o. female.  Patient here with cough for the last couple days.  Some body aches.  Shortness of breath at times.  History of hypertension skin cancer.  Nothing makes it worse or better.  She denies any fever chills or sick contacts.  Denies any weakness numbness tingling.  Denies any exertional symptoms.  Denies smoking history.  No COPD asthma history.  No recent surgery or travel.  No blood clot history.  The history is provided by the patient.       Home Medications Prior to Admission medications   Medication Sig Start Date End Date Taking? Authorizing Provider  azithromycin (ZITHROMAX) 250 MG tablet Take 1 tablet (250 mg total) by mouth daily. Take first 2 tablets together, then 1 every day until finished. 06/03/23  Yes Jhon Mallozzi, DO  predniSONE (DELTASONE) 20 MG tablet Take 2 tablets (40 mg total) by mouth daily for 4 days. 06/03/23 06/07/23 Yes Dannel Rafter, DO  amLODipine (NORVASC) 2.5 MG tablet Take 1 tablet (2.5 mg total) by mouth daily. 05/30/23   Mahlon Gammon, MD  aspirin EC 81 MG tablet Take 81 mg by mouth daily.    [provider]  Calcium Carb-Cholecalciferol (CALCIUM 600/VITAMIN D3 PO) Take 1 tablet by mouth daily.    [provider]  Cholecalciferol (VITAMIN D-3) 1000 units CAPS Take 1,000 Units by mouth daily.     [provider]  folic acid (FOLVITE) 800 MCG tablet Take 800 mcg by mouth daily.     [provider]  levothyroxine (SYNTHROID) 125 MCG tablet Take 1 tablet (125 mcg total) by mouth daily before breakfast. 03/20/23   Mahlon Gammon, MD  metoprolol tartrate (LOPRESSOR) 25 MG tablet TAKE ONE TABLET TWICE DAILY WITH MEALS 04/02/23   Mahlon Gammon, MD  Multiple Vitamins-Minerals (PRESERVISION  AREDS PO) Take 1 capsule by mouth in the morning and at bedtime.    [provider]  pravastatin (PRAVACHOL) 80 MG tablet TAKE ONE TABLET EVERY DAY 02/26/23   Mahlon Gammon, MD  spironolactone (ALDACTONE) 25 MG tablet TAKE 1/2 TABLET EVERY DAY 04/03/23   Fargo, Amy E, NP  traZODone (DESYREL) 50 MG tablet TAKE ONE TABLET EVERY DAY AT BEDTIME 04/02/23   Mahlon Gammon, MD  valsartan (DIOVAN) 320 MG tablet TAKE ONE TABLET EVERY DAY 04/02/23   Nahser, Deloris Ping, MD  vitamin C (ASCORBIC ACID) 500 MG tablet Take 500 mg by mouth daily.    [provider]      Allergies    Phenergan [promethazine hcl], Tizanidine, and Lisinopril    Review of Systems   Review of Systems  Physical Exam Updated Vital Signs BP 133/61   Pulse 68   Temp 97.9 F (36.6 C) (Oral)   Resp 13   Ht 5\' 4"  (1.626 m)   Wt 125.6 kg   SpO2 95%   BMI 47.55 kg/m  Physical Exam Vitals and nursing note reviewed.  Constitutional:      General: She is not in acute distress.    Appearance: She is well-developed. She is not ill-appearing.  HENT:     Head: Normocephalic and atraumatic.     Mouth/Throat:     Mouth: Mucous membranes are moist.  Eyes:  Extraocular Movements: Extraocular movements intact.     Conjunctiva/sclera: Conjunctivae normal.     Pupils: Pupils are equal, round, and reactive to light.  Cardiovascular:     Rate and Rhythm: Normal rate and regular rhythm.     Pulses: Normal pulses.     Heart sounds: Normal heart sounds. No murmur heard. Pulmonary:     Effort: Pulmonary effort is normal. No respiratory distress.     Breath sounds: Wheezing present.  Abdominal:     Palpations: Abdomen is soft.     Tenderness: There is no abdominal tenderness.  Musculoskeletal:        General: No swelling. Normal range of motion.     Cervical back: Normal range of motion and neck supple.     Right lower leg: No edema.     Left lower leg: No edema.  Skin:    General: Skin is warm and dry.      Capillary Refill: Capillary refill takes less than 2 seconds.  Neurological:     Mental Status: She is alert.  Psychiatric:        Mood and Affect: Mood normal.     ED Results / Procedures / Treatments   Labs (all labs ordered are listed, but only abnormal results are displayed) Labs Reviewed  BASIC METABOLIC PANEL - Abnormal; Notable for the following components:      Result Value   Sodium 132 (*)    Chloride 96 (*)    Glucose, Bld 140 (*)    GFR, Estimated 56 (*)    All other components within normal limits  BRAIN NATRIURETIC PEPTIDE - Abnormal; Notable for the following components:   B Natriuretic Peptide 189.5 (*)    All other components within normal limits  RESP PANEL BY RT-PCR (RSV, FLU A&B, COVID)  [id number]  CBC WITH DIFFERENTIAL/PLATELET  TROPONIN I (HIGH SENSITIVITY)    EKG EKG Interpretation Date/Time:  Monday June 03 2023 09:12:10 EST Ventricular Rate:  84 PR Interval:  145 QRS Duration:  101 QT Interval:  415 QTC Calculation: 491 R Axis:   60  Text Interpretation: Sinus rhythm Confirmed by Virgina Norfolk [phone]) on 06/03/2023 9:24:50 AM  Radiology DG Chest Portable 1 View  Result Date: 06/03/2023 CLINICAL DATA:  Shortness of breath. EXAM: PORTABLE CHEST 1 VIEW COMPARISON:  08/02/2022. FINDINGS: Bilateral lung fields are clear. Note is made of elevated right hemidiaphragm. Bilateral costophrenic angles are clear. Normal cardio-mediastinal silhouette. No acute osseous abnormalities. Right shoulder reverse arthroplasty noted. The soft tissues are within normal limits. IMPRESSION: No active disease. Electronically Signed   By: Jules Schick M.D.   On: 06/03/2023 10:11    Procedures Procedures    Medications Ordered in ED Medications  albuterol (VENTOLIN HFA) 108 (90 Base) MCG/ACT inhaler 2 puff (2 puffs Inhalation Given 06/03/23 0935)  albuterol (VENTOLIN HFA) 108 (90 Base) MCG/ACT inhaler (has no administration in time range)  predniSONE (DELTASONE)  tablet 60 mg (has no administration in time range)  ipratropium-albuterol (DUONEB) 0.5-2.5 (3) MG/3ML nebulizer solution 3 mL (3 mLs Nebulization Given 06/03/23 0924)  albuterol (PROVENTIL) (2.5 MG/3ML) 0.083% nebulizer solution 2.5 mg (2.5 mg Nebulization Given 06/03/23 0924)  AeroChamber Plus Flo-Vu Large MISC 1 each (1 each Other Given 06/03/23 6045)    ED Course/ Medical Decision Making/ A&P                                 Medical Decision  Making Amount and/or Complexity of Data Reviewed Labs: ordered. Radiology: ordered.  Risk Prescription drug management.   LABRISHA WUELLNER is here with cough.  History of hypertension.  Well-appearing.  Normal vitals.  No fever.  No signs of volume overload on exam.  Differential diagnosis likely viral process/bronchitis but will evaluate for heart failure, ACS.  Have no concern for PE given history and physical.  No risk factors.  Overall EKG shows sinus rhythm.  No ischemic changes.  I reviewed interpreted EKG.  CBC BMP troponin COVID and flu test BNP and chest x-ray were obtained and per my review and interpretation of the labs and images things are unremarkable.  Troponin negative.  BNP 189.  No signs of volume overload on chest x-ray.  No pneumonia.  COVID and flu test negative.  No significant anemia or electrolyte abnormality otherwise.  She is feeling better after breathing treatment here.  She will be given prednisone and Z-Pak and inhaler for bronchitis.  Understands return precautions and discharged in the ED in good condition.  This chart was dictated using voice recognition software.  Despite best efforts to proofread,  errors can occur which can change the documentation meaning.         Final Clinical Impression(s) / ED Diagnoses Final diagnoses:  Bronchitis    Rx / DC Orders ED Discharge Orders          Ordered    azithromycin (ZITHROMAX) 250 MG tablet  Daily        06/03/23 1033    predniSONE (DELTASONE) 20 MG tablet  Daily         06/03/23 1033              Virgina Norfolk, DO 06/03/23 1035

## 2023-06-03 NOTE — ED Notes (Signed)
Discharge paperwork given and verbally understood. 

## 2023-06-03 NOTE — Discharge Instructions (Signed)
Use 2 puffs of the inhaler every 4-6 hours as needed for cough.  Take your next dose of prednisone tomorrow.  Take your first dose of a Zithromax when you get home from the hospital.

## 2023-06-03 NOTE — ED Triage Notes (Signed)
Pt c/o productive cough, SOB x2-3 days. Pt denies fever. Pt denies pulmonary hx/COPD/CHF.

## 2023-06-04 ENCOUNTER — Telehealth: Payer: Self-pay

## 2023-06-04 NOTE — Transitions of Care (Post Inpatient/ED Visit) (Signed)
06/04/2023  Name: Samantha Stone MRN: [id number] DOB: 1931/06/12  Today's TOC FU Call Status: Today's TOC FU Call Status:: Successful TOC FU Call Completed TOC FU Call Complete Date: 06/04/23 Patient's Name and Date of Birth confirmed.  Transition Care Management Follow-up Telephone Call Date of Discharge: 06/03/23 Discharge Facility: [facility] ([facility]) Type of Discharge: Emergency Department How have you been since you were released from the hospital?: Better Any questions or concerns?: No  Items Reviewed: Did you receive and understand the discharge instructions provided?: Yes Medications obtained,verified, and reconciled?: Yes (Medications Reviewed) Any new allergies since your discharge?: No Dietary orders reviewed?: Yes Do you have support at home?: Yes People in Home: facility resident  Medications Reviewed Today: Medications Reviewed Today     Reviewed by Karena Addison, LPN (Licensed Practical Nurse) on 06/04/23 at 1612  Med List Status: <None>   Medication Order Taking? Sig Documenting Provider Last Dose Status Informant  amLODipine (NORVASC) 2.5 MG tablet [id number]  Take 1 tablet (2.5 mg total) by mouth daily. Mahlon Gammon, MD  Active   aspirin EC 81 MG tablet [id number] No Take 81 mg by mouth daily. [provider] Taking Active Self  azithromycin (ZITHROMAX) 250 MG tablet [id number]  Take first 2 tablets together day 1, then 1 every day until finished. Virgina Norfolk, DO  Active   Calcium Carb-Cholecalciferol (CALCIUM 600/VITAMIN D3 PO) [id number] No Take 1 tablet by mouth daily. [provider] Taking Active Self  Cholecalciferol (VITAMIN D-3) 1000 units CAPS [id number] No Take 1,000 Units by mouth daily.  [provider] Taking Active Self  folic acid (FOLVITE) 800 MCG tablet [id number] No Take 800 mcg by mouth daily.  [provider] Taking Active Self  levothyroxine (SYNTHROID) 125 MCG tablet [id number] No Take 1  tablet (125 mcg total) by mouth daily before breakfast. Mahlon Gammon, MD Taking Active   metoprolol tartrate (LOPRESSOR) 25 MG tablet [id number] No TAKE ONE TABLET TWICE DAILY WITH MEALS Mahlon Gammon, MD Taking Active   Multiple Vitamins-Minerals (PRESERVISION AREDS PO) [id number] No Take 1 capsule by mouth in the morning and at bedtime. [provider] Taking Active   pravastatin (PRAVACHOL) 80 MG tablet [id number] No TAKE ONE TABLET EVERY DAY Mahlon Gammon, MD Taking Active   predniSONE (DELTASONE) 20 MG tablet [id number]  Take 2 tablets (40 mg total) by mouth daily for 4 days. Virgina Norfolk, DO  Active   spironolactone (ALDACTONE) 25 MG tablet [id number] No TAKE 1/2 TABLET EVERY DAY Fargo, Amy E, NP Taking Active   traZODone (DESYREL) 50 MG tablet [id number] No TAKE ONE TABLET EVERY DAY AT BEDTIME Mahlon Gammon, MD Taking Active   valsartan (DIOVAN) 320 MG tablet [id number] No TAKE ONE TABLET EVERY DAY Nahser, Deloris Ping, MD Taking Active   vitamin C (ASCORBIC ACID) 500 MG tablet [id number] No Take 500 mg by mouth daily. [provider] Taking Active Self            Home Care and Equipment/Supplies: Were Home Health Services Ordered?: NA Any new equipment or medical supplies ordered?: NA  Functional Questionnaire: Do you need assistance with bathing/showering or dressing?: Yes Do you need assistance with meal preparation?: Yes Do you need assistance with eating?: No Do you have difficulty maintaining continence: No Do you need assistance with getting out of bed/getting out of a chair/moving?: No Do you have difficulty managing or taking your medications?: Yes  Follow up appointments reviewed: PCP Follow-up appointment confirmed?: Yes Date of  PCP follow-up appointment?: 06/11/23 Follow-up Provider: [facility] Follow-up appointment confirmed?: NA Do you need transportation to your follow-up appointment?: No Do you understand care options if your  condition(s) worsen?: Yes-patient verbalized understanding    SIGNATURE Karena Addison, LPN [facility] Nurse Health Advisor Direct Dial [phone]

## 2023-06-11 ENCOUNTER — Encounter: Payer: Self-pay | Admitting: Internal Medicine

## 2023-06-11 ENCOUNTER — Ambulatory Visit: Payer: Medicare Other | Admitting: Internal Medicine

## 2023-06-11 VITALS — BP 130/70 | HR 77 | Temp 97.8°F | Resp 16 | Ht 64.0 in | Wt 274.0 lb

## 2023-06-11 DIAGNOSIS — J209 Acute bronchitis, unspecified: Secondary | ICD-10-CM

## 2023-06-11 DIAGNOSIS — I5032 Chronic diastolic (congestive) heart failure: Secondary | ICD-10-CM | POA: Diagnosis not present

## 2023-06-11 MED ORDER — PREDNISONE 10 MG PO TABS: 10.0000 mg | ORAL_TABLET | Freq: Every day | ORAL | 0 refills | Status: DC

## 2023-06-11 MED ORDER — DM-GUAIFENESIN ER 30-600 MG PO TB12: 1.0000 | ORAL_TABLET | Freq: Two times a day (BID) | ORAL | 0 refills | Status: DC

## 2023-06-11 NOTE — Progress Notes (Signed)
Location:  [facility]   Place of Service:   Clinic  Provider:   Code Status:  Goals of Care:     06/11/2023    1:20 PM  Advanced Directives  Does Patient Have a Medical Advance Directive? Yes  Type of Advance Directive Out of facility DNR (pink MOST or yellow form)  Does patient want to make changes to medical advance directive? No - Patient declined     Chief Complaint  Patient presents with   Transitions Of Care    Patient states she is here for TOC. Patient hd some trouble breathing and pain in hip while coughing    Immunizations    Patient is due for pneumonia, flu and covid vaccine     HPI: Patient is a 89 y.o. female seen today for an acute visit for Cough Productive And Lower Abdominal Pain [facility] [place]  Patient was send to ED on 11/04 for Coughing And wheezing and SOB Was discharged on 40 mg of Prednisone for 4 days with Zithromax She continues to have Cough And now having Lower abdominal pain due to Cough Mostly Dry but Sometimes coughing Clear Mucus Dyspnea on exertion ? Also present at baseline Also feeling weak and tired Just attended her Granddaughter wedding  Other history Patient has a history of left total knee revision by Dr. Luiz Blare on 07/01/2020 History of chronic diastolic CHF, hyperlipidemia, and hypothyroidism   HTN Doing well Overweight Very sedentary   Uses walker now Insomnia On Trazodone Retired OR Engineer, [employer] (consulting)   Past Medical History:  Diagnosis Date   Arthritis    Cancer (HCC)    basal cell skin cancer   Concussion    Dyspnea    upon exertion   Hypertension    Hypothyroidism    Stress incontinence    Total knee replacement status, bilateral     Past Surgical History:  Procedure Laterality Date   ABDOMINAL HYSTERECTOMY     APPENDECTOMY     CARPAL TUNNEL RELEASE     rt x2   COLONOSCOPY     EYE SURGERY Bilateral    cataract surgery with lens implant   FINGER AMPUTATION Right    little finger   I & D EXTREMITY   03/11/2012   Procedure: MINOR IRRIGATION AND DEBRIDEMENT EXTREMITY;  Surgeon: Wyn Forster., MD;  Location: [facility];  Service: Orthopedics;  Laterality: Right;  Right little finger   KNEE ARTHROSCOPY     bilat    REPLACEMENT TOTAL KNEE BILATERAL     REVERSE SHOULDER ARTHROPLASTY Right 09/04/2018   Procedure: REVERSE SHOULDER ARTHROPLASTY;  Surgeon: Jones Broom, MD;  Location: [facility];  Service: Orthopedics;  Laterality: Right;   TONSILLECTOMY     TOTAL KNEE REVISION Right 03/14/2015   Procedure: TIBIAL REVISION, OPEN REDUCTION INTERNAL FIXATION PROXIMAL TIBIA;  Surgeon: Jodi Geralds, MD;  Location: [facility];  Service: Orthopedics;  Laterality: Right;   TOTAL KNEE REVISION Left 07/01/2020   Procedure: TOTAL KNEE REVISION;  Surgeon: Jodi Geralds, MD;  Location: [facility];  Service: Orthopedics;  Laterality: Left;   TRIGGER FINGER RELEASE     TUBAL LIGATION      Allergies  Allergen Reactions   Phenergan [Promethazine Hcl] Other (See Comments)    hallucinations   Tizanidine     delirium   Lisinopril Cough    Outpatient Encounter Medications as of 06/11/2023  Medication Sig   amLODipine (NORVASC) 2.5 MG tablet Take 1 tablet (2.5 mg total)  by mouth daily.   aspirin EC 81 MG tablet Take 81 mg by mouth daily.   Calcium Carb-Cholecalciferol (CALCIUM 600/VITAMIN D3 PO) Take 1 tablet by mouth daily.   Cholecalciferol (VITAMIN D-3) 1000 units CAPS Take 1,000 Units by mouth daily.    dextromethorphan-guaiFENesin (MUCINEX DM) 30-600 MG 12hr tablet Take 1 tablet by mouth 2 (two) times daily.   folic acid (FOLVITE) 800 MCG tablet Take 800 mcg by mouth daily.    levothyroxine (SYNTHROID) 125 MCG tablet Take 1 tablet (125 mcg total) by mouth daily before breakfast.   metoprolol tartrate (LOPRESSOR) 25 MG tablet TAKE ONE TABLET TWICE DAILY WITH MEALS   Multiple Vitamins-Minerals (PRESERVISION AREDS PO) Take 1 capsule by mouth in the morning and at bedtime.   pravastatin (PRAVACHOL) 80  MG tablet TAKE ONE TABLET EVERY DAY   predniSONE (DELTASONE) 10 MG tablet Take 1 tablet (10 mg total) by mouth daily with breakfast. Take 4 tabs for 3 days 3 tabs for 3 days 2 tabs for 3 days 1 tabs for 3 days   spironolactone (ALDACTONE) 25 MG tablet TAKE 1/2 TABLET EVERY DAY   traZODone (DESYREL) 50 MG tablet TAKE ONE TABLET EVERY DAY AT BEDTIME   valsartan (DIOVAN) 320 MG tablet TAKE ONE TABLET EVERY DAY   vitamin C (ASCORBIC ACID) 500 MG tablet Take 500 mg by mouth daily.   [DISCONTINUED] azithromycin (ZITHROMAX) 250 MG tablet Take first 2 tablets together day 1, then 1 every day until finished. (Patient not taking: Reported on 06/11/2023)   No facility-administered encounter medications on file as of 06/11/2023.    Review of Systems:  Review of Systems  Constitutional:  Negative for activity change and appetite change.  HENT:  Positive for congestion and postnasal drip.   Respiratory:  Positive for cough, shortness of breath and wheezing.   Cardiovascular:  Negative for leg swelling.  Gastrointestinal:  Negative for constipation.  Genitourinary: Negative.   Musculoskeletal:  Positive for gait problem. Negative for arthralgias and myalgias.  Skin: Negative.   Neurological:  Positive for weakness. Negative for dizziness.  Psychiatric/Behavioral:  Negative for confusion, dysphoric mood and sleep disturbance.     Health Maintenance  Topic Date Due   Pneumonia Vaccine 34+ Years old (3 of 3 - PPSV23 or PCV20) 02/28/2020   INFLUENZA VACCINE  02/28/2023   COVID-19 Vaccine (4 - 2023-24 season) 03/31/2023   Medicare Annual Wellness (AWV)  02/11/2024   DTaP/Tdap/Td (3 - Td or Tdap) 02/26/2030   DEXA SCAN  Completed   Zoster Vaccines- Shingrix  Completed   HPV VACCINES  Aged Out    Physical Exam: Vitals:   06/11/23 1320  BP: 130/70  Pulse: 77  Resp: 16  Temp: 97.8 F (36.6 C)  TempSrc: Temporal  SpO2: 97%  Weight: 274 lb (124.3 kg)  Height: 5\' 4"  (1.626 m)   Body mass index  is 47.03 kg/m. Physical Exam Vitals reviewed.  Constitutional:      Appearance: Normal appearance.  HENT:     Head: Normocephalic.     Nose: Nose normal.     Mouth/Throat:     Mouth: Mucous membranes are moist.     Pharynx: Oropharynx is clear.  Eyes:     Pupils: Pupils are equal, round, and reactive to light.  Cardiovascular:     Rate and Rhythm: Normal rate and regular rhythm.     Pulses: Normal pulses.     Heart sounds: Normal heart sounds. No murmur heard. Pulmonary:  Effort: Pulmonary effort is normal.     Breath sounds: Normal breath sounds.     Comments: Some expiratory Wheezing Abdominal:     General: Abdomen is flat. Bowel sounds are normal.     Palpations: Abdomen is soft.  Musculoskeletal:        General: No swelling.     Cervical back: Neck supple.  Skin:    General: Skin is warm.  Neurological:     General: No focal deficit present.     Mental Status: She is alert and oriented to person, place, and time.  Psychiatric:        Mood and Affect: Mood normal.        Thought Content: Thought content normal.     Labs reviewed: Basic Metabolic Panel: Recent Labs    12/18/22 0000 05/30/23 0000 06/03/23 0925  NA 135* 135* 132*  K 4.5 4.8 4.4  CL 99 97* 96*  CO2 27* 29* 30  GLUCOSE  --   --  140*  BUN 16 17 11   CREATININE 1.0 1.1 0.96  CALCIUM 9.6 9.7 9.9  TSH  --  1.59  --    Liver Function Tests: Recent Labs    05/30/23 0000  AST 20  ALT 17  ALKPHOS 66  ALBUMIN 3.9   No results for input(s): "LIPASE", "AMYLASE" in the last 8760 hours. No results for input(s): "AMMONIA" in the last 8760 hours. CBC: Recent Labs    12/18/22 0000 05/30/23 0000 06/03/23 0925  WBC 5.8 7.4 7.6  NEUTROABS  --   --  4.9  HGB 13.6 12.7 12.6  HCT 41 38 38.0  MCV  --   --  88.6  PLT 268 241 257   Lipid Panel: Recent Labs    05/30/23 0000  CHOL 151  HDL 61  LDLCALC 72  TRIG 87   Lab Results  Component Value Date   HGBA1C 5.5 06/16/2014     Procedures since last visit: DG Chest Portable 1 View  Result Date: 06/03/2023 CLINICAL DATA:  Shortness of breath. EXAM: PORTABLE CHEST 1 VIEW COMPARISON:  08/02/2022. FINDINGS: Bilateral lung fields are clear. Note is made of elevated right hemidiaphragm. Bilateral costophrenic angles are clear. Normal cardio-mediastinal silhouette. No acute osseous abnormalities. Right shoulder reverse arthroplasty noted. The soft tissues are within normal limits. IMPRESSION: No active disease. Electronically Signed   By: Jules Schick M.D.   On: 06/03/2023 10:11    Assessment/Plan 1. Acute bronchitis, unspecified organism Will do  Longer Prednisone Taper and Mucinex  2. Chronic diastolic congestive heart failure (HCC) On Low dose of Aldactone Weight is stable Chest Xray in the hospital was negative    Labs/tests ordered:  * No order type specified * Next appt:  11/26/2023

## 2023-06-19 DIAGNOSIS — L814 Other melanin hyperpigmentation: Secondary | ICD-10-CM | POA: Diagnosis not present

## 2023-06-19 DIAGNOSIS — L570 Actinic keratosis: Secondary | ICD-10-CM | POA: Diagnosis not present

## 2023-06-19 DIAGNOSIS — L821 Other seborrheic keratosis: Secondary | ICD-10-CM | POA: Diagnosis not present

## 2023-06-20 DIAGNOSIS — H40053 Ocular hypertension, bilateral: Secondary | ICD-10-CM | POA: Diagnosis not present

## 2023-07-29 ENCOUNTER — Non-Acute Institutional Stay: Payer: Medicare Other | Admitting: Adult Health

## 2023-07-29 ENCOUNTER — Encounter: Payer: Self-pay | Admitting: Adult Health

## 2023-07-29 VITALS — BP 132/82 | HR 70 | Temp 97.6°F | Resp 17 | Ht 64.0 in | Wt 273.6 lb

## 2023-07-29 DIAGNOSIS — I5032 Chronic diastolic (congestive) heart failure: Secondary | ICD-10-CM | POA: Diagnosis not present

## 2023-07-29 DIAGNOSIS — J209 Acute bronchitis, unspecified: Secondary | ICD-10-CM | POA: Diagnosis not present

## 2023-07-29 NOTE — Progress Notes (Signed)
Location:  [facility]  POS: Clinic  Provider: Fletcher Anon, ANP  Code Status: DNR Goals of Care:     07/29/2023    1:44 PM  Advanced Directives  Does Patient Have a Medical Advance Directive? Yes  Type of Advance Directive Out of facility DNR (pink MOST or yellow form)  Does patient want to make changes to medical advance directive? No - Patient declined     Chief Complaint  Patient presents with   Medical Management of Chronic Issues    Patient is due for a 6 week follow up    Immunizations    Patient is due for pneumonia, flu and covid vaccine     HPI: Patient is a 89 y.o. female seen today for medical management   PMH significant for HTN, depression, obesity, HLD, hypothyroidism, diastolic CHF, OA  Seen in the ED 06/03/23 due to cough/wheeze/sob Discharge with prednisone and Zithromax for bronchitis.  Seen by Dr Chales Abrahams 06/11/23 prednisone taper extended, mucinex CXR 06/03/23 negative BNP 189.5  She is here for follow up. Never smoker. No hx of asthma.  Has a slight cough but no wheezing or congestion  Has shortness of breath with exertion which is chronic and unchanged Quit driving Feels she is better Past Medical History:  Diagnosis Date   Arthritis    Cancer (HCC)    basal cell skin cancer   Concussion    Dyspnea    upon exertion   Hypertension    Hypothyroidism    Stress incontinence    Total knee replacement status, bilateral     Past Surgical History:  Procedure Laterality Date   ABDOMINAL HYSTERECTOMY     APPENDECTOMY     CARPAL TUNNEL RELEASE     rt x2   COLONOSCOPY     EYE SURGERY Bilateral    cataract surgery with lens implant   FINGER AMPUTATION Right    little finger   I & D EXTREMITY  03/11/2012   Procedure: MINOR IRRIGATION AND DEBRIDEMENT EXTREMITY;  Surgeon: Wyn Forster., MD;  Location: [facility];  Service: Orthopedics;  Laterality: Right;  Right little finger   KNEE ARTHROSCOPY     bilat    REPLACEMENT  TOTAL KNEE BILATERAL     REVERSE SHOULDER ARTHROPLASTY Right 09/04/2018   Procedure: REVERSE SHOULDER ARTHROPLASTY;  Surgeon: Jones Broom, MD;  Location: [facility];  Service: Orthopedics;  Laterality: Right;   TONSILLECTOMY     TOTAL KNEE REVISION Right 03/14/2015   Procedure: TIBIAL REVISION, OPEN REDUCTION INTERNAL FIXATION PROXIMAL TIBIA;  Surgeon: Jodi Geralds, MD;  Location: [facility];  Service: Orthopedics;  Laterality: Right;   TOTAL KNEE REVISION Left 07/01/2020   Procedure: TOTAL KNEE REVISION;  Surgeon: Jodi Geralds, MD;  Location: [facility];  Service: Orthopedics;  Laterality: Left;   TRIGGER FINGER RELEASE     TUBAL LIGATION      Allergies  Allergen Reactions   Phenergan [Promethazine Hcl] Other (See Comments)    hallucinations   Tizanidine     delirium   Lisinopril Cough    Outpatient Encounter Medications as of 07/29/2023  Medication Sig   amLODipine (NORVASC) 2.5 MG tablet Take 1 tablet (2.5 mg total) by mouth daily.   aspirin EC 81 MG tablet Take 81 mg by mouth daily.   Calcium Carb-Cholecalciferol (CALCIUM 600/VITAMIN D3 PO) Take 1 tablet by mouth daily.   Cholecalciferol (VITAMIN D-3) 1000 units CAPS Take 1,000 Units by mouth daily.    dextromethorphan-guaiFENesin [facility]  DM) 30-600 MG 12hr tablet Take 1 tablet by mouth 2 (two) times daily.   folic acid (FOLVITE) 800 MCG tablet Take 800 mcg by mouth daily.    levothyroxine (SYNTHROID) 125 MCG tablet Take 1 tablet (125 mcg total) by mouth daily before breakfast.   metoprolol tartrate (LOPRESSOR) 25 MG tablet TAKE ONE TABLET TWICE DAILY WITH MEALS   Multiple Vitamins-Minerals (PRESERVISION AREDS PO) Take 1 capsule by mouth in the morning and at bedtime.   pravastatin (PRAVACHOL) 80 MG tablet TAKE ONE TABLET EVERY DAY   predniSONE (DELTASONE) 10 MG tablet Take 1 tablet (10 mg total) by mouth daily with breakfast. Take 4 tabs for 3 days 3 tabs for 3 days 2 tabs for 3 days 1 tabs for 3 days   spironolactone (ALDACTONE) 25 MG  tablet TAKE 1/2 TABLET EVERY DAY   traZODone (DESYREL) 50 MG tablet TAKE ONE TABLET EVERY DAY AT BEDTIME   valsartan (DIOVAN) 320 MG tablet TAKE ONE TABLET EVERY DAY   vitamin C (ASCORBIC ACID) 500 MG tablet Take 500 mg by mouth daily.   No facility-administered encounter medications on file as of 07/29/2023.    Review of Systems:  Review of Systems  Constitutional:  Negative for activity change, appetite change, chills, diaphoresis, fatigue, fever and unexpected weight change.  HENT:  Negative for congestion.   Respiratory:  Negative for cough, shortness of breath and wheezing.   Cardiovascular:  Negative for chest pain, palpitations and leg swelling.  Gastrointestinal:  Negative for abdominal distention, abdominal pain, constipation and diarrhea.  Genitourinary:  Negative for difficulty urinating and dysuria.  Musculoskeletal:  Positive for gait problem (uses walker). Negative for arthralgias, back pain, joint swelling and myalgias.  Neurological:  Negative for dizziness, tremors, seizures, syncope, facial asymmetry, speech difficulty, weakness, light-headedness, numbness and headaches.  Psychiatric/Behavioral:  Negative for agitation, behavioral problems and confusion.     Health Maintenance  Topic Date Due   Pneumonia Vaccine 38+ Years old (3 of 3 - PPSV23 or PCV20) 02/28/2016   INFLUENZA VACCINE  02/28/2023   COVID-19 Vaccine (4 - 2024-25 season) 03/31/2023   Medicare Annual Wellness (AWV)  02/11/2024   DTaP/Tdap/Td (3 - Td or Tdap) 02/26/2030   DEXA SCAN  Completed   Zoster Vaccines- Shingrix  Completed   HPV VACCINES  Aged Out    Physical Exam: Vitals:   07/29/23 1338  BP: 132/82  Pulse: 70  Resp: 17  Temp: 97.6 F (36.4 C)  TempSrc: Temporal  SpO2: 97%  Weight: 273 lb 9.6 oz (124.1 kg)  Height: 5\' 4"  (1.626 m)    Wt Readings from Last 3 Encounters:  07/29/23 273 lb 9.6 oz (124.1 kg)  06/11/23 274 lb (124.3 kg)  06/03/23 277 lb (125.6 kg)    Body mass index  is 46.96 kg/m. Wt Readings from Last 3 Encounters:  07/29/23 273 lb 9.6 oz (124.1 kg)  06/11/23 274 lb (124.3 kg)  06/03/23 277 lb (125.6 kg)    Physical Exam Vitals and nursing note reviewed.  Constitutional:      General: She is not in acute distress.    Appearance: She is not diaphoretic.  HENT:     Head: Normocephalic and atraumatic.     Left Ear: Tympanic membrane and ear canal normal.     Nose: Nose normal.     Mouth/Throat:     Mouth: Mucous membranes are moist.     Pharynx: Oropharynx is clear.  Eyes:     Conjunctiva/sclera: Conjunctivae normal.  Pupils: Pupils are equal, round, and reactive to light.  Neck:     Vascular: No JVD.  Cardiovascular:     Rate and Rhythm: Normal rate and regular rhythm.     Heart sounds: No murmur heard. Pulmonary:     Effort: Pulmonary effort is normal. No respiratory distress.     Breath sounds: Normal breath sounds. No wheezing.  Musculoskeletal:     Cervical back: No rigidity or tenderness.  Lymphadenopathy:     Cervical: No cervical adenopathy.  Skin:    General: Skin is warm and dry.  Neurological:     Mental Status: She is alert and oriented to person, place, and time.  Psychiatric:        Mood and Affect: Mood normal.     Labs reviewed: Basic Metabolic Panel: Recent Labs    12/18/22 0000 05/30/23 0000 06/03/23 0925  NA 135* 135* 132*  K 4.5 4.8 4.4  CL 99 97* 96*  CO2 27* 29* 30  GLUCOSE  --   --  140*  BUN 16 17 11   CREATININE 1.0 1.1 0.96  CALCIUM 9.6 9.7 9.9  TSH  --  1.59  --    Liver Function Tests: Recent Labs    05/30/23 0000  AST 20  ALT 17  ALKPHOS 66  ALBUMIN 3.9    No results for input(s): "LIPASE", "AMYLASE" in the last 8760 hours. No results for input(s): "AMMONIA" in the last 8760 hours. CBC: Recent Labs    12/18/22 0000 05/30/23 0000 06/03/23 0925  WBC 5.8 7.4 7.6  NEUTROABS  --   --  4.9  HGB 13.6 12.7 12.6  HCT 41 38 38.0  MCV  --   --  88.6  PLT 268 241 257   Lipid  Panel: Recent Labs    05/30/23 0000  CHOL 151  HDL 61  LDLCALC 72  TRIG 87    Lab Results  Component Value Date   HGBA1C 5.5 06/16/2014    Procedures since last visit: No results found.  Assessment/Plan    1. Acute bronchitis, unspecified organism (Primary) Resolved Let her know if she has another URI she can use the albuterol inhaler she has at home If worsening RTC   Recommend flu vaccine   2. Chronic diastolic congestive heart failure (HCC) Appears well compensated Weight down 4 lbs Continue aldactone    Already has f/u scheduled in April 2025   Total time :  time greater than 50% of total time spent doing pt counseling and coordination of care

## 2023-08-14 DIAGNOSIS — L720 Epidermal cyst: Secondary | ICD-10-CM | POA: Diagnosis not present

## 2023-08-14 DIAGNOSIS — L905 Scar conditions and fibrosis of skin: Secondary | ICD-10-CM | POA: Diagnosis not present

## 2023-08-14 DIAGNOSIS — L570 Actinic keratosis: Secondary | ICD-10-CM | POA: Diagnosis not present

## 2023-08-14 DIAGNOSIS — L821 Other seborrheic keratosis: Secondary | ICD-10-CM | POA: Diagnosis not present

## 2023-08-14 DIAGNOSIS — L814 Other melanin hyperpigmentation: Secondary | ICD-10-CM | POA: Diagnosis not present

## 2023-09-09 ENCOUNTER — Other Ambulatory Visit: Payer: Self-pay | Admitting: Internal Medicine

## 2023-09-09 DIAGNOSIS — E039 Hypothyroidism, unspecified: Secondary | ICD-10-CM

## 2023-10-19 ENCOUNTER — Other Ambulatory Visit: Payer: Self-pay | Admitting: Cardiovascular Disease

## 2023-11-21 DIAGNOSIS — I10 Essential (primary) hypertension: Secondary | ICD-10-CM | POA: Diagnosis not present

## 2023-11-21 DIAGNOSIS — E7800 Pure hypercholesterolemia, unspecified: Secondary | ICD-10-CM | POA: Diagnosis not present

## 2023-11-21 LAB — BASIC METABOLIC PANEL WITH GFR
BUN: 15 (ref 4–21)
CO2: 25 — AB (ref 13–22)
Chloride: 98 — AB (ref 99–108)
Creatinine: 1 (ref 0.5–1.1)
Glucose: 104
Potassium: 4.8 meq/L (ref 3.5–5.1)
Sodium: 136 — AB (ref 137–147)

## 2023-11-21 LAB — CBC AND DIFFERENTIAL
HCT: 39 (ref 36–46)
Hemoglobin: 12.6 (ref 12.0–16.0)
Platelets: 258 10*3/uL (ref 150–400)
WBC: 5.8

## 2023-11-21 LAB — HEPATIC FUNCTION PANEL
ALT: 20 U/L (ref 7–35)
AST: 24 (ref 13–35)
Alkaline Phosphatase: 63 (ref 25–125)

## 2023-11-21 LAB — CBC: RBC: 4.42 (ref 3.87–5.11)

## 2023-11-21 LAB — COMPREHENSIVE METABOLIC PANEL WITH GFR
Albumin: 3.8 (ref 3.5–5.0)
Calcium: 9.4 (ref 8.7–10.7)
Globulin: 2.6
eGFR: 54

## 2023-11-26 ENCOUNTER — Non-Acute Institutional Stay: Payer: Medicare Other | Admitting: Internal Medicine

## 2023-11-26 ENCOUNTER — Encounter: Payer: Self-pay | Admitting: Internal Medicine

## 2023-11-26 VITALS — BP 138/80 | HR 65 | Temp 98.0°F | Resp 18 | Ht 64.0 in | Wt 267.2 lb

## 2023-11-26 DIAGNOSIS — H353132 Nonexudative age-related macular degeneration, bilateral, intermediate dry stage: Secondary | ICD-10-CM | POA: Diagnosis not present

## 2023-11-26 DIAGNOSIS — I10 Essential (primary) hypertension: Secondary | ICD-10-CM

## 2023-11-26 DIAGNOSIS — F5101 Primary insomnia: Secondary | ICD-10-CM | POA: Diagnosis not present

## 2023-11-26 DIAGNOSIS — E7800 Pure hypercholesterolemia, unspecified: Secondary | ICD-10-CM | POA: Diagnosis not present

## 2023-11-26 DIAGNOSIS — E039 Hypothyroidism, unspecified: Secondary | ICD-10-CM | POA: Diagnosis not present

## 2023-11-26 DIAGNOSIS — I5032 Chronic diastolic (congestive) heart failure: Secondary | ICD-10-CM | POA: Diagnosis not present

## 2023-11-27 DIAGNOSIS — H353112 Nonexudative age-related macular degeneration, right eye, intermediate dry stage: Secondary | ICD-10-CM | POA: Diagnosis not present

## 2023-11-27 DIAGNOSIS — H353221 Exudative age-related macular degeneration, left eye, with active choroidal neovascularization: Secondary | ICD-10-CM | POA: Diagnosis not present

## 2023-11-27 DIAGNOSIS — H43813 Vitreous degeneration, bilateral: Secondary | ICD-10-CM | POA: Diagnosis not present

## 2023-11-30 NOTE — Progress Notes (Unsigned)
 Location:  [facility] features editor of Service:  Clinic (12)  Provider:   Code Status:  Goals of Care:     07/29/2023    1:44 PM  Advanced Directives  Does Patient Have a Medical Advance Directive? Yes  Type of Advance Directive Out of facility DNR (pink MOST or yellow form)  Does patient want to make changes to medical advance directive? No - Patient declined     Chief Complaint  Patient presents with   Follow-up    6 month follow up. Patient has concerns about a bump on right side nose.    HPI: Patient is a 89 y.o. female seen today for medical management of chronic diseases.   [place] in [place]    History of chronic diastolic CHF, hyperlipidemia, and hypothyroidism Chronic Arthritis  Discussed the use of AI scribe software for clinical note transcription with the patient, who gave verbal consent to proceed.  History of Present Illness     macular degeneration who presents with vision changes in her left eye. Recent Worsening Planning to see Specialist tomorrow   She has stopped driving and no longer owns a car. She has four children, two of whom live locally, and nine grandchildren.  She is actively trying to lose weight by avoiding desserts, resulting in a weight loss of five to six pounds. She consumes light beer daily and occasionally wine. She avoids bread due to a choking incident.  She takes trazodone  50 mg nightly for sleep. She naps easily during the day in her recliner. Does nto want any work up for Sleep Apnea  She wears a pad for urinary incontinence. She uses a walker for long distances but not at home. She has hearing aids but does not use them regularly and reports wax buildup in her ears.     Is trying to loose weight Wt Readings from Last 3 Encounters:  11/26/23 267 lb 3.2 oz (121.2 kg)  07/29/23 273 lb 9.6 oz (124.1 kg)  06/11/23 274 lb (124.3 kg)     Past Medical History:  Diagnosis Date   Arthritis    Cancer (HCC)     basal cell skin cancer   Concussion    Dyspnea    upon exertion   Hypertension    Hypothyroidism    Stress incontinence    Total knee replacement status, bilateral     Past Surgical History:  Procedure Laterality Date   ABDOMINAL HYSTERECTOMY     APPENDECTOMY     CARPAL TUNNEL RELEASE     rt x2   COLONOSCOPY     EYE SURGERY Bilateral    cataract surgery with lens implant   FINGER AMPUTATION Right    little finger   I & D EXTREMITY  03/11/2012   Procedure: MINOR IRRIGATION AND DEBRIDEMENT EXTREMITY;  Surgeon: Amelie Baize., MD;  Location: [facility];  Service: Orthopedics;  Laterality: Right;  Right little finger   KNEE ARTHROSCOPY     bilat    REPLACEMENT TOTAL KNEE BILATERAL     REVERSE SHOULDER ARTHROPLASTY Right 09/04/2018   Procedure: REVERSE SHOULDER ARTHROPLASTY;  Surgeon: Sammye Cristal, MD;  Location: [facility];  Service: Orthopedics;  Laterality: Right;   TONSILLECTOMY     TOTAL KNEE REVISION Right 03/14/2015   Procedure: TIBIAL REVISION, OPEN REDUCTION INTERNAL FIXATION PROXIMAL TIBIA;  Surgeon: Neil Balls, MD;  Location: [facility];  Service: Orthopedics;  Laterality: Right;   TOTAL KNEE REVISION Left  07/01/2020   Procedure: TOTAL KNEE REVISION;  Surgeon: Neil Balls, MD;  Location: [facility];  Service: Orthopedics;  Laterality: Left;   TRIGGER FINGER RELEASE     TUBAL LIGATION      Allergies  Allergen Reactions   Phenergan  [Promethazine  Hcl] Other (See Comments)    hallucinations   Tizanidine     delirium   Lisinopril Cough    Outpatient Encounter Medications as of 11/26/2023  Medication Sig   amLODipine  (NORVASC ) 2.5 MG tablet Take 1 tablet (2.5 mg total) by mouth daily.   aspirin  EC 81 MG tablet Take 81 mg by mouth daily.   Calcium Carb-Cholecalciferol (CALCIUM 600/VITAMIN D3 PO) Take 1 tablet by mouth daily.   Cholecalciferol (VITAMIN D-3) 1000 units CAPS Take 1,000 Units by mouth daily.    folic acid  (FOLVITE ) 800 MCG tablet Take 800 mcg by  mouth daily.    levothyroxine  (SYNTHROID ) 125 MCG tablet TAKE ONE TABLET BY MOUTH DAILY BEFORE BREAKFAST   metoprolol  tartrate (LOPRESSOR ) 25 MG tablet TAKE ONE TABLET TWICE DAILY WITH MEALS   Multiple Vitamins-Minerals (PRESERVISION AREDS PO) Take 1 capsule by mouth in the morning and at bedtime.   pravastatin  (PRAVACHOL ) 80 MG tablet TAKE ONE TABLET EVERY DAY   spironolactone  (ALDACTONE ) 25 MG tablet TAKE 1/2 TABLET EVERY DAY   traZODone  (DESYREL ) 50 MG tablet TAKE ONE TABLET BY MOUTH AT BEDTIME   valsartan  (DIOVAN ) 320 MG tablet TAKE ONE TABLET BY MOUTH DAILY   vitamin C  (ASCORBIC ACID ) 500 MG tablet Take 500 mg by mouth daily.   [DISCONTINUED] predniSONE  (DELTASONE ) 10 MG tablet Take 1 tablet (10 mg total) by mouth daily with breakfast. Take 4 tabs for 3 days 3 tabs for 3 days 2 tabs for 3 days 1 tabs for 3 days   No facility-administered encounter medications on file as of 11/26/2023.    Review of Systems:  Review of Systems  Constitutional:  Negative for activity change and appetite change.  HENT: Negative.    Respiratory:  Negative for cough and shortness of breath.   Cardiovascular:  Negative for leg swelling.  Gastrointestinal:  Negative for constipation.  Genitourinary: Negative.   Musculoskeletal:  Positive for arthralgias and gait problem. Negative for myalgias.  Skin: Negative.   Neurological:  Negative for dizziness and weakness.  Psychiatric/Behavioral:  Negative for confusion, dysphoric mood and sleep disturbance.     Health Maintenance  Topic Date Due   Pneumonia Vaccine 56+ Years old (3 of 3 - PCV20 or PCV21) 02/28/2020   COVID-19 Vaccine (4 - 2024-25 season) 06/09/2024 (Originally 03/31/2023)   Medicare Annual Wellness (AWV)  02/11/2024   INFLUENZA VACCINE  02/28/2024   DTaP/Tdap/Td (3 - Td or Tdap) 02/26/2030   DEXA SCAN  Completed   Zoster Vaccines- Shingrix   Completed   HPV VACCINES  Aged Out   Meningococcal B Vaccine  Aged Out    Physical Exam: Vitals:    11/26/23 1207  BP: 138/80  Pulse: 65  Resp: 18  Temp: 98 F (36.7 C)  SpO2: 95%  Weight: 267 lb 3.2 oz (121.2 kg)  Height: 5\' 4"  (1.626 m)   Body mass index is 45.86 kg/m. Physical Exam Vitals reviewed.  Constitutional:      Appearance: Normal appearance.  HENT:     Head: Normocephalic.     Right Ear: Tympanic membrane normal.     Left Ear: Tympanic membrane normal.     Ears:     Comments: Wax in Both ears  Nose: Nose normal.     Mouth/Throat:     Mouth: Mucous membranes are moist.     Pharynx: Oropharynx is clear.  Eyes:     Pupils: Pupils are equal, round, and reactive to light.  Cardiovascular:     Rate and Rhythm: Normal rate and regular rhythm.     Pulses: Normal pulses.     Heart sounds: Normal heart sounds. No murmur heard. Pulmonary:     Effort: Pulmonary effort is normal.     Breath sounds: Normal breath sounds.  Abdominal:     General: Abdomen is flat. Bowel sounds are normal.     Palpations: Abdomen is soft.  Musculoskeletal:        General: Swelling present.     Cervical back: Neck supple.  Skin:    General: Skin is warm.  Neurological:     General: No focal deficit present.     Mental Status: She is alert and oriented to person, place, and time.  Psychiatric:        Mood and Affect: Mood normal.        Thought Content: Thought content normal.     Labs reviewed: Basic Metabolic Panel: Recent Labs    05/30/23 0000 06/03/23 0925 11/21/23 0000  NA 135* 132* 136*  K 4.8 4.4 4.8  CL 97* 96* 98*  CO2 29* 30 25*  GLUCOSE  --  140*  --   BUN 17 11 15   CREATININE 1.1 0.96 1.0  CALCIUM 9.7 9.9 9.4  TSH 1.59  --   --    Liver Function Tests: Recent Labs    05/30/23 0000 11/21/23 0000  AST 20 24  ALT 17 20  ALKPHOS 66 63  ALBUMIN 3.9 3.8   No results for input(s): "LIPASE", "AMYLASE" in the last 8760 hours. No results for input(s): "AMMONIA" in the last 8760 hours. CBC: Recent Labs    05/30/23 0000 06/03/23 0925 11/21/23 0000   WBC 7.4 7.6 5.8  NEUTROABS  --  4.9  --   HGB 12.7 12.6 12.6  HCT 38 38.0 39  MCV  --  88.6  --   PLT 241 257 258   Lipid Panel: Recent Labs    05/30/23 0000  CHOL 151  HDL 61  LDLCALC 72  TRIG 87   Lab Results  Component Value Date   HGBA1C 5.5 06/16/2014    Procedures since last visit: No results found.  Assessment/Plan Assessment and Plan   1. Essential hypertension (Primary) Diovan  And Norvasc  and Lopressor    2. Chronic diastolic congestive heart failure (HCC) Aldactone   3. Pure hypercholesterolemia Statin  4. Morbid (severe) obesity due to excess calories (HCC)   5. Acquired hypothyroidism TSH normal in 10/24  degeneration Is not driving anymore Follow up with [facility] 7. Primary insomnia Trazodone  8 Urinary Incontinence Refuses further work up 9 Ear Wax Follow up here in [place] with [facility] for wax removal       Labs/tests ordered:  Labs before next visit Next appt:  Visit date not found

## 2023-12-02 ENCOUNTER — Other Ambulatory Visit: Payer: Self-pay | Admitting: Internal Medicine

## 2023-12-02 DIAGNOSIS — E7800 Pure hypercholesterolemia, unspecified: Secondary | ICD-10-CM

## 2023-12-04 DIAGNOSIS — H353221 Exudative age-related macular degeneration, left eye, with active choroidal neovascularization: Secondary | ICD-10-CM | POA: Diagnosis not present

## 2023-12-04 DIAGNOSIS — H353112 Nonexudative age-related macular degeneration, right eye, intermediate dry stage: Secondary | ICD-10-CM | POA: Diagnosis not present

## 2023-12-04 DIAGNOSIS — H43813 Vitreous degeneration, bilateral: Secondary | ICD-10-CM | POA: Diagnosis not present

## 2023-12-09 ENCOUNTER — Encounter: Payer: Medicare Other | Admitting: Adult Health

## 2023-12-17 DIAGNOSIS — H353221 Exudative age-related macular degeneration, left eye, with active choroidal neovascularization: Secondary | ICD-10-CM | POA: Diagnosis not present

## 2024-01-07 ENCOUNTER — Other Ambulatory Visit: Payer: Self-pay | Admitting: Internal Medicine

## 2024-01-07 NOTE — Telephone Encounter (Signed)
 High risk warning unable to refill.

## 2024-01-14 DIAGNOSIS — H353112 Nonexudative age-related macular degeneration, right eye, intermediate dry stage: Secondary | ICD-10-CM | POA: Diagnosis not present

## 2024-01-14 DIAGNOSIS — H353221 Exudative age-related macular degeneration, left eye, with active choroidal neovascularization: Secondary | ICD-10-CM | POA: Diagnosis not present

## 2024-01-14 DIAGNOSIS — H43813 Vitreous degeneration, bilateral: Secondary | ICD-10-CM | POA: Diagnosis not present

## 2024-02-12 ENCOUNTER — Other Ambulatory Visit: Payer: Self-pay | Admitting: Internal Medicine

## 2024-02-12 DIAGNOSIS — L821 Other seborrheic keratosis: Secondary | ICD-10-CM | POA: Diagnosis not present

## 2024-02-12 DIAGNOSIS — L814 Other melanin hyperpigmentation: Secondary | ICD-10-CM | POA: Diagnosis not present

## 2024-02-12 DIAGNOSIS — L905 Scar conditions and fibrosis of skin: Secondary | ICD-10-CM | POA: Diagnosis not present

## 2024-02-12 DIAGNOSIS — L720 Epidermal cyst: Secondary | ICD-10-CM | POA: Diagnosis not present

## 2024-02-12 DIAGNOSIS — L820 Inflamed seborrheic keratosis: Secondary | ICD-10-CM | POA: Diagnosis not present

## 2024-02-13 DIAGNOSIS — H353221 Exudative age-related macular degeneration, left eye, with active choroidal neovascularization: Secondary | ICD-10-CM | POA: Diagnosis not present

## 2024-02-13 DIAGNOSIS — H43813 Vitreous degeneration, bilateral: Secondary | ICD-10-CM | POA: Diagnosis not present

## 2024-02-13 DIAGNOSIS — H353112 Nonexudative age-related macular degeneration, right eye, intermediate dry stage: Secondary | ICD-10-CM | POA: Diagnosis not present

## 2024-02-19 ENCOUNTER — Other Ambulatory Visit: Payer: Self-pay | Admitting: Internal Medicine

## 2024-02-20 ENCOUNTER — Other Ambulatory Visit: Payer: Self-pay | Admitting: Internal Medicine

## 2024-02-20 DIAGNOSIS — E039 Hypothyroidism, unspecified: Secondary | ICD-10-CM

## 2024-03-12 DIAGNOSIS — H353112 Nonexudative age-related macular degeneration, right eye, intermediate dry stage: Secondary | ICD-10-CM | POA: Diagnosis not present

## 2024-03-12 DIAGNOSIS — H353221 Exudative age-related macular degeneration, left eye, with active choroidal neovascularization: Secondary | ICD-10-CM | POA: Diagnosis not present

## 2024-03-12 DIAGNOSIS — H43813 Vitreous degeneration, bilateral: Secondary | ICD-10-CM | POA: Diagnosis not present

## 2024-03-23 ENCOUNTER — Telehealth: Payer: Self-pay | Admitting: Adult Health

## 2024-03-23 DIAGNOSIS — R350 Frequency of micturition: Secondary | ICD-10-CM

## 2024-03-23 NOTE — Telephone Encounter (Signed)
 Pt walked over to the clinic reporting urinary frequency and not feeling well Obtained urine sample Recommend f/u apt Will call her with result.

## 2024-03-24 DIAGNOSIS — R350 Frequency of micturition: Secondary | ICD-10-CM | POA: Diagnosis not present

## 2024-03-26 ENCOUNTER — Telehealth: Payer: Self-pay | Admitting: Adult Health

## 2024-03-26 DIAGNOSIS — N3000 Acute cystitis without hematuria: Secondary | ICD-10-CM

## 2024-03-26 MED ORDER — CEPHALEXIN 500 MG PO CAPS: 500.0000 mg | ORAL_CAPSULE | Freq: Three times a day (TID) | ORAL | 0 refills | Status: AC

## 2024-03-26 NOTE — Telephone Encounter (Signed)
 UA C and S returned with >100,000K of Ecoli Will prescribe keflex  Called and discussed with Ms. Bordley and she verbalized understanding.  F/U apt in place.

## 2024-04-06 ENCOUNTER — Encounter: Payer: Self-pay | Admitting: Adult Health

## 2024-04-06 ENCOUNTER — Non-Acute Institutional Stay: Admitting: Adult Health

## 2024-04-06 VITALS — BP 148/86 | HR 71 | Temp 97.5°F | Ht 64.0 in | Wt 271.0 lb

## 2024-04-06 DIAGNOSIS — N3281 Overactive bladder: Secondary | ICD-10-CM

## 2024-04-06 MED ORDER — SOLIFENACIN SUCCINATE 5 MG PO TABS: 5.0000 mg | ORAL_TABLET | Freq: Every day | ORAL | 1 refills | Status: DC

## 2024-04-06 NOTE — Progress Notes (Signed)
 Location:  [facility]  POS: Clinic  Provider: Tawni America, ANP   Goals of Care:     07/29/2023    1:44 PM  Advanced Directives  Does Patient Have a Medical Advance Directive? Yes  Type of Advance Directive Out of facility DNR (pink MOST or yellow form)  Does patient want to make changes to medical advance directive? No - Patient declined     Chief Complaint  Patient presents with  . Urinary Tract Infection    Symptoms have not cleared up. Patient says that she has lots of questions for you.  Plans to get covid and flu vaccine here(postponed)    HPI: Discussed the use of AI scribe software for clinical note transcription with the patient, who gave verbal consent to proceed.  History of Present Illness Samantha Stone is a 89 year old female who presents with persistent urinary frequency.  Urinary frequency and incontinence - Persistent urinary frequency despite treatment with Keflex  for urinary tract infection identified on August 28th with 100,000 colonies of E. Coli - No improvement in urinary frequency since antibiotic therapy - Urinary frequency disrupts sleep - No dysuria or suprapubic pain - Sensation of incomplete bladder emptying - Constant dribbling of urine, requiring use of a pee pad - Worsening urinary dribbling over time - Children have observed urinary incontinence  Sleep disturbance - Sleep disrupted by urinary frequency - Currently taking Trazodone  50 mg for sleep, which she identifies as 'Detrol'  Medication effects and adherence - Currently taking a diuretic, which she believes may worsen urinary symptoms - Current medications include Norvasc , aspirin , calcium, vitamin D, Synthroid , metoprolol , Preservision, Pravachol , Diovan , vitamin C , spironolactone , valsartan , and Norvasc  - Recently ran out of folic acid  and has not refilled  Blood pressure management - Blood pressure is borderline controlled - Taking multiple antihypertensive  medications including spironolactone , valsartan , and Norvasc      Past Medical History:  Diagnosis Date  . Arthritis   . Cancer (HCC)    basal cell skin cancer  . Concussion   . Dyspnea    upon exertion  . Hypertension   . Hypothyroidism   . Stress incontinence   . Total knee replacement status, bilateral     Past Surgical History:  Procedure Laterality Date  . ABDOMINAL HYSTERECTOMY    . APPENDECTOMY    . CARPAL TUNNEL RELEASE     rt x2  . COLONOSCOPY    . EYE SURGERY Bilateral    cataract surgery with lens implant  . FINGER AMPUTATION Right    little finger  . I & D EXTREMITY  03/11/2012   Procedure: MINOR IRRIGATION AND DEBRIDEMENT EXTREMITY;  Surgeon: Lamar LULLA Leonor Mickey., MD;  Location: [facility];  Service: Orthopedics;  Laterality: Right;  Right little finger  . KNEE ARTHROSCOPY     bilat   . REPLACEMENT TOTAL KNEE BILATERAL    . REVERSE SHOULDER ARTHROPLASTY Right 09/04/2018   Procedure: REVERSE SHOULDER ARTHROPLASTY;  Surgeon: Dozier Soulier, MD;  Location: [facility];  Service: Orthopedics;  Laterality: Right;  . TONSILLECTOMY    . TOTAL KNEE REVISION Right 03/14/2015   Procedure: TIBIAL REVISION, OPEN REDUCTION INTERNAL FIXATION PROXIMAL TIBIA;  Surgeon: Norleen Gavel, MD;  Location: [facility];  Service: Orthopedics;  Laterality: Right;  . TOTAL KNEE REVISION Left 07/01/2020   Procedure: TOTAL KNEE REVISION;  Surgeon: Gavel Norleen, MD;  Location: [facility];  Service: Orthopedics;  Laterality: Left;  . TRIGGER FINGER RELEASE    . TUBAL  LIGATION      Allergies  Allergen Reactions  . Phenergan  [Promethazine  Hcl] Other (See Comments)    hallucinations  . Tizanidine     delirium  . Lisinopril Cough    Outpatient Encounter Medications as of 04/06/2024  Medication Sig  . amLODipine  (NORVASC ) 2.5 MG tablet Take 1 tablet (2.5 mg total) by mouth daily.  . aspirin  EC 81 MG tablet Take 81 mg by mouth daily.  . Calcium Carb-Cholecalciferol (CALCIUM 600/VITAMIN D3 PO)  Take 1 tablet by mouth daily.  . Cholecalciferol (VITAMIN D-3) 1000 units CAPS Take 1,000 Units by mouth daily.   . levothyroxine  (SYNTHROID ) 125 MCG tablet TAKE ONE TABLET BY MOUTH DAILY BEFORE BREAKFAST  . metoprolol  tartrate (LOPRESSOR ) 25 MG tablet TAKE ONE TABLET BY MOUTH TWICE DAILY WITH MEALS  . Multiple Vitamins-Minerals (PRESERVISION AREDS PO) Take 1 capsule by mouth in the morning and at bedtime.  . pravastatin  (PRAVACHOL ) 80 MG tablet TAKE ONE TABLET BY MOUTH EVERY DAY  . solifenacin  (VESICARE ) 5 MG tablet Take 1 tablet (5 mg total) by mouth daily.  . spironolactone  (ALDACTONE ) 25 MG tablet TAKE 1/2 TABLET ONCE DAILY  . traZODone  (DESYREL ) 50 MG tablet TAKE ONE TABLET BY MOUTH AT BEDTIME  . valsartan  (DIOVAN ) 320 MG tablet TAKE ONE TABLET BY MOUTH DAILY  . vitamin C  (ASCORBIC ACID ) 500 MG tablet Take 500 mg by mouth daily.  . [DISCONTINUED] folic acid  (FOLVITE ) 800 MCG tablet Take 800 mcg by mouth daily.  (Patient not taking: Reported on 04/06/2024)   No facility-administered encounter medications on file as of 04/06/2024.    Review of Systems:  Review of Systems  Health Maintenance  Topic Date Due  . Pneumococcal Vaccine: 50+ Years (3 of 3 - PCV20 or PCV21) 02/28/2020  . Medicare Annual Wellness (AWV)  02/11/2024  . Influenza Vaccine  06/26/2024 (Originally 02/28/2024)  . COVID-19 Vaccine (5 - Moderna risk 2024-25 season) 06/26/2024 (Originally 03/30/2024)  . DTaP/Tdap/Td (3 - Td or Tdap) 02/26/2030  . DEXA SCAN  Completed  . Zoster Vaccines- Shingrix   Completed  . HPV VACCINES  Aged Out  . Meningococcal B Vaccine  Aged Out    Physical Exam: Vitals:   04/06/24 1113 04/06/24 1511  BP: (!) 150/86 (!) 148/86  Pulse: 71   Temp: (!) 97.5 F (36.4 C)   SpO2: 99%   Weight: 271 lb (122.9 kg)   Height: 5' 4 (1.626 m)    Body mass index is 46.52 kg/m. Physical Exam  Labs reviewed: Basic Metabolic Panel: Recent Labs    05/30/23 0000 06/03/23 0925 11/21/23 0000  NA 135*  132* 136*  K 4.8 4.4 4.8  CL 97* 96* 98*  CO2 29* 30 25*  GLUCOSE  --  140*  --   BUN 17 11 15   CREATININE 1.1 0.96 1.0  CALCIUM 9.7 9.9 9.4  TSH 1.59  --   --    Liver Function Tests: Recent Labs    05/30/23 0000 11/21/23 0000  AST 20 24  ALT 17 20  ALKPHOS 66 63  ALBUMIN 3.9 3.8   No results for input(s): LIPASE, AMYLASE in the last 8760 hours. No results for input(s): AMMONIA in the last 8760 hours. CBC: Recent Labs    05/30/23 0000 06/03/23 0925 11/21/23 0000  WBC 7.4 7.6 5.8  NEUTROABS  --  4.9  --   HGB 12.7 12.6 12.6  HCT 38 38.0 39  MCV  --  88.6  --   PLT 241 257  258   Lipid Panel: Recent Labs    05/30/23 0000  CHOL 151  HDL 61  LDLCALC 72  TRIG 87   Lab Results  Component Value Date   HGBA1C 5.5 06/16/2014    Procedures since last visit: No results found.  Assessment/Plan Assessment and Plan Assessment & Plan Overactive bladder with urinary frequency and incontinence Persistent symptoms despite UTI treatment. Possible overactive bladder or incomplete bladder emptying. Considered Vesicare  due to insurance limitations with Myrbetriq . - Prescribe Vesicare . - Advise trial of Vesicare  for two weeks and reassess. - Discuss lifestyle modifications: avoid alcohol and caffeine, limit fluids after dinner, double voiding technique. - Consider urologist referral if symptoms persist.  Hypertension Blood pressure borderline controlled with Norvasc , metoprolol , and Diovan . Readings approximately 148/86, acceptable for age but requires multiple medications.  Hyperlipidemia Managed with Pravachol .  Hypothyroidism Managed with Synthroid .  Vitamin D deficiency Managed with calcium and vitamin D supplementation.  Follow-Up Follow-up plans discussed for reassessment of overactive bladder treatment. - Schedule follow-up appointment in 2-3 weeks to assess response to Vesicare .      Labs/tests ordered:  * No order type specified * Next appt:   06/01/2024   Total time :  time greater than 50% of total time spent doing pt counseling and coordination of care

## 2024-04-06 NOTE — Patient Instructions (Addendum)
  VISIT SUMMARY: Today, we discussed your persistent urinary frequency and incontinence, which have not improved despite previous treatment for a urinary tract infection. We also reviewed your current medications, blood pressure management, and other health conditions.  YOUR PLAN: -OVERACTIVE BLADDER WITH URINARY FREQUENCY AND INCONTINENCE: Overactive bladder is a condition where the bladder muscles contract uncontrollably, causing frequent urination and incontinence. We will start you on Vesicare  and reassess in two weeks. Please avoid alcohol and caffeine, limit fluids after dinner, and try double voiding (urinating twice within a few minutes) to help manage your symptoms. If there is no improvement, we may refer you to a urologist.  -HYPERTENSION: Hypertension, or high blood pressure, is being managed with your current medications. Your blood pressure readings are around 148/86, which is acceptable for your age but requires multiple medications to control.  -HYPERLIPIDEMIA: Hyperlipidemia is a condition of having high levels of fats in the blood, which is being managed with Pravachol .  -HYPOTHYROIDISM: Hypothyroidism is a condition where the thyroid  gland does not produce enough hormones, and it is being managed with Synthroid .  -VITAMIN D DEFICIENCY: Vitamin D deficiency means you have lower than normal levels of vitamin D, which is being managed with calcium and vitamin D supplements.  INSTRUCTIONS: Please schedule a follow-up appointment in 2-3 weeks to assess your response to Vesicare . Continue taking your current medications as prescribed, and remember to refill your folic acid . If your symptoms do not improve or if you experience any new issues, contact our office.                      Contains text generated by [employer]bridge.                                 Contains text generated by [employer]bridge.

## 2024-04-09 DIAGNOSIS — H43813 Vitreous degeneration, bilateral: Secondary | ICD-10-CM | POA: Diagnosis not present

## 2024-04-09 DIAGNOSIS — H353221 Exudative age-related macular degeneration, left eye, with active choroidal neovascularization: Secondary | ICD-10-CM | POA: Diagnosis not present

## 2024-04-09 DIAGNOSIS — M25521 Pain in right elbow: Secondary | ICD-10-CM | POA: Diagnosis not present

## 2024-04-09 DIAGNOSIS — M7061 Trochanteric bursitis, right hip: Secondary | ICD-10-CM | POA: Diagnosis not present

## 2024-04-09 DIAGNOSIS — H353112 Nonexudative age-related macular degeneration, right eye, intermediate dry stage: Secondary | ICD-10-CM | POA: Diagnosis not present

## 2024-04-09 DIAGNOSIS — M79641 Pain in right hand: Secondary | ICD-10-CM | POA: Diagnosis not present

## 2024-04-20 ENCOUNTER — Non-Acute Institutional Stay: Admitting: Adult Health

## 2024-04-21 NOTE — Progress Notes (Signed)
 This encounter was created in error - please disregard.

## 2024-04-23 DIAGNOSIS — M25551 Pain in right hip: Secondary | ICD-10-CM | POA: Diagnosis not present

## 2024-04-23 DIAGNOSIS — S63619A Unspecified sprain of unspecified finger, initial encounter: Secondary | ICD-10-CM | POA: Diagnosis not present

## 2024-04-23 DIAGNOSIS — M79641 Pain in right hand: Secondary | ICD-10-CM | POA: Diagnosis not present

## 2024-04-25 ENCOUNTER — Other Ambulatory Visit: Payer: Self-pay | Admitting: Internal Medicine

## 2024-04-27 ENCOUNTER — Other Ambulatory Visit: Payer: Self-pay

## 2024-04-27 NOTE — Telephone Encounter (Signed)
Pended Rx and sent to Dr. Gupta for approval due to HIGH ALERT Warning.  °

## 2024-04-28 ENCOUNTER — Other Ambulatory Visit: Payer: Self-pay | Admitting: Internal Medicine

## 2024-04-28 DIAGNOSIS — M25641 Stiffness of right hand, not elsewhere classified: Secondary | ICD-10-CM | POA: Diagnosis not present

## 2024-04-28 NOTE — Telephone Encounter (Signed)
 High Risk Warning Populated when attempting to refill, I will send to Provider for further review

## 2024-04-29 MED ORDER — VALSARTAN 320 MG PO TABS: 320.0000 mg | ORAL_TABLET | Freq: Every day | ORAL | 0 refills | Status: DC

## 2024-05-07 DIAGNOSIS — H353112 Nonexudative age-related macular degeneration, right eye, intermediate dry stage: Secondary | ICD-10-CM | POA: Diagnosis not present

## 2024-05-07 DIAGNOSIS — H43813 Vitreous degeneration, bilateral: Secondary | ICD-10-CM | POA: Diagnosis not present

## 2024-05-07 DIAGNOSIS — H353221 Exudative age-related macular degeneration, left eye, with active choroidal neovascularization: Secondary | ICD-10-CM | POA: Diagnosis not present

## 2024-05-28 LAB — CBC AND DIFFERENTIAL
HCT: 36 (ref 36–46)
Hemoglobin: 12.3 (ref 12.0–16.0)
Platelets: 278 K/uL (ref 150–400)
WBC: 6.7

## 2024-05-28 LAB — BASIC METABOLIC PANEL WITH GFR
BUN: 18 (ref 4–21)
CO2: 24 — AB (ref 13–22)
Chloride: 98 — AB (ref 99–108)
Creatinine: 1.1 (ref 0.5–1.1)
Glucose: 96
Potassium: 4.9 meq/L (ref 3.5–5.1)
Sodium: 133 — AB (ref 137–147)

## 2024-05-28 LAB — COMPREHENSIVE METABOLIC PANEL WITH GFR
Calcium: 9.4 (ref 8.7–10.7)
Globulin: 2.4
eGFR: 50

## 2024-05-28 LAB — HEPATIC FUNCTION PANEL
ALT: 17 U/L (ref 7–35)
AST: 22 (ref 13–35)
Alkaline Phosphatase: 76 (ref 25–125)

## 2024-05-28 LAB — LIPID PANEL
Cholesterol: 133 (ref 0–200)
HDL: 51 (ref 35–70)
LDL Cholesterol: 67
Triglycerides: 74 (ref 40–160)

## 2024-05-28 LAB — TSH: TSH: 4.74 (ref 0.41–5.90)

## 2024-05-28 LAB — CBC: RBC: 4.16 (ref 3.87–5.11)

## 2024-05-29 ENCOUNTER — Telehealth: Payer: Self-pay

## 2024-05-29 NOTE — Telephone Encounter (Signed)
 Copied from CRM [id number]. Topic: Clinical - Medical Advice >> May 29, 2024 12:20 PM Diannia H wrote: Reason for CRM: Patients daughter called on 10/31 to let the provider know that the patient does have bed sores. She is wanting the provider to know ahead of time because the patient will not speak of this.  She was at her home last week and Mrs, Dagoberto (her daughter) noticed them and tried to help her with them. For any questions or further explanation please call Mrs, Dagoberto at [phone].

## 2024-05-29 NOTE — Telephone Encounter (Signed)
 Please advise Charlanne Fredia CROME, MD

## 2024-06-01 ENCOUNTER — Encounter: Payer: Self-pay | Admitting: Adult Health

## 2024-06-01 ENCOUNTER — Non-Acute Institutional Stay: Payer: Self-pay | Admitting: Adult Health

## 2024-06-01 VITALS — BP 138/88 | HR 62 | Temp 97.8°F | Ht 64.0 in | Wt 269.0 lb

## 2024-06-01 DIAGNOSIS — E039 Hypothyroidism, unspecified: Secondary | ICD-10-CM

## 2024-06-01 DIAGNOSIS — E7800 Pure hypercholesterolemia, unspecified: Secondary | ICD-10-CM

## 2024-06-01 DIAGNOSIS — J45991 Cough variant asthma: Secondary | ICD-10-CM | POA: Diagnosis not present

## 2024-06-01 DIAGNOSIS — K629 Disease of anus and rectum, unspecified: Secondary | ICD-10-CM | POA: Diagnosis not present

## 2024-06-01 DIAGNOSIS — N3945 Continuous leakage: Secondary | ICD-10-CM

## 2024-06-01 DIAGNOSIS — I5032 Chronic diastolic (congestive) heart failure: Secondary | ICD-10-CM

## 2024-06-01 NOTE — Progress Notes (Signed)
 Location:  [facility]  POS: Clinic  Provider: Tawni America, ANP   Goals of Care:     07/29/2023    1:44 PM  Advanced Directives  Does Patient Have a Medical Advance Directive? Yes  Type of Advance Directive Out of facility DNR (pink MOST or yellow form)  Does patient want to make changes to medical advance directive? No - Patient declined     Chief Complaint  Patient presents with   Follow-up    6 month follow up Patient has concerns about bed sores    HPI: Discussed the use of AI scribe software for clinical note transcription with the patient, who gave verbal consent to proceed.  Lives in [place] in [place] History of Present Illness Samantha Stone is a 89 year old female who presents with a growth on her buttock.  Cutaneous lesion of right buttock - Recent onset of a growth on the right buttock, described as a 'bed sore type thing' - No associated pain - Frequently sits and changes pads which she uses for urinary incontinence often to remain dry  Urinary symptoms - Recent completion of antibiotics for urinary tract infection with improved urinary frequency - Persistent urinary urgency - Regular use of pads for urinary incontinence - Previously trialed VESIcare  without perceived benefit  Fall without injury - Recent fall at home after tripping over an ottoman - No injuries sustained  Chronic cough and dyspnea - Persistent cough with yellow phlegm - Shortness of breath with exertion, consistent with baseline - History of asthma  Follows with Dr Jarold for age related macular degeneration Using trazodone  for sleep, declined sleep apnea work up in the past.  HTN controlled  Hypothyroidism TSH 4.74 05/28/24 Diastolic CHF: has chronic doe, minimal edema, unchanged weight. Using aldactone   HLD on statin Wt Readings from Last 3 Encounters:  06/01/24 269 lb (122 kg)  04/06/24 271 lb (122.9 kg)  11/26/23 267 lb 3.2 oz (121.2 kg)     Past Medical History:   Diagnosis Date   Arthritis    Cancer (HCC)    basal cell skin cancer   Concussion    Dyspnea    upon exertion   Hypertension    Hypothyroidism    Stress incontinence    Total knee replacement status, bilateral     Past Surgical History:  Procedure Laterality Date   ABDOMINAL HYSTERECTOMY     APPENDECTOMY     CARPAL TUNNEL RELEASE     rt x2   COLONOSCOPY     EYE SURGERY Bilateral    cataract surgery with lens implant   FINGER AMPUTATION Right    little finger   I & D EXTREMITY  03/11/2012   Procedure: MINOR IRRIGATION AND DEBRIDEMENT EXTREMITY;  Surgeon: Lamar LULLA Leonor Mickey., MD;  Location:  SURGERY CENTER;  Service: Orthopedics;  Laterality: Right;  Right little finger   KNEE ARTHROSCOPY     bilat    REPLACEMENT TOTAL KNEE BILATERAL     REVERSE SHOULDER ARTHROPLASTY Right 09/04/2018   Procedure: REVERSE SHOULDER ARTHROPLASTY;  Surgeon: Dozier Soulier, MD;  Location: [facility] OR;  Service: Orthopedics;  Laterality: Right;   TONSILLECTOMY     TOTAL KNEE REVISION Right 03/14/2015   Procedure: TIBIAL REVISION, OPEN REDUCTION INTERNAL FIXATION PROXIMAL TIBIA;  Surgeon: Norleen Gavel, MD;  Location: [facility] OR;  Service: Orthopedics;  Laterality: Right;   TOTAL KNEE REVISION Left 07/01/2020   Procedure: TOTAL KNEE REVISION;  Surgeon: Gavel Norleen, MD;  Location: [facility];  Service: Orthopedics;  Laterality: Left;   TRIGGER FINGER RELEASE     TUBAL LIGATION      Allergies  Allergen Reactions   Phenergan  [Promethazine  Hcl] Other (See Comments)    hallucinations   Tizanidine     delirium   Lisinopril Cough    Outpatient Encounter Medications as of 06/01/2024  Medication Sig   amLODipine  (NORVASC ) 2.5 MG tablet Take 1 tablet (2.5 mg total) by mouth daily.   aspirin  EC 81 MG tablet Take 81 mg by mouth daily.   Calcium Carb-Cholecalciferol (CALCIUM 600/VITAMIN D3 PO) Take 1 tablet by mouth daily.   Cholecalciferol (VITAMIN D-3) 1000 units CAPS Take 1,000 Units by mouth daily.     levothyroxine  (SYNTHROID ) 125 MCG tablet TAKE ONE TABLET BY MOUTH DAILY BEFORE BREAKFAST   metoprolol  tartrate (LOPRESSOR ) 25 MG tablet TAKE ONE TABLET BY MOUTH TWICE DAILY WITH MEALS   Multiple Vitamins-Minerals (PRESERVISION AREDS PO) Take 1 capsule by mouth in the morning and at bedtime.   pravastatin  (PRAVACHOL ) 80 MG tablet TAKE ONE TABLET BY MOUTH EVERY DAY   solifenacin  (VESICARE ) 5 MG tablet Take 1 tablet (5 mg total) by mouth daily.   spironolactone  (ALDACTONE ) 25 MG tablet TAKE 1/2 TABLET ONCE DAILY   traZODone  (DESYREL ) 50 MG tablet TAKE ONE TABLET BY MOUTH AT BEDTIME   valsartan  (DIOVAN ) 320 MG tablet Take 1 tablet (320 mg total) by mouth daily. Please advise patient to make overdue yearly cardiologist appointment for future refills. Thank you and 1st attempt   vitamin C  (ASCORBIC ACID ) 500 MG tablet Take 500 mg by mouth daily.   No facility-administered encounter medications on file as of 06/01/2024.    Review of Systems:  Review of Systems  Constitutional:  Negative for activity change, appetite change, chills, diaphoresis, fatigue and fever.  HENT:  Negative for congestion.   Respiratory:  Positive for cough (intermittent  long term, no sputum) and shortness of breath (on exertion). Negative for wheezing.   Cardiovascular:  Negative for chest pain and leg swelling.  Gastrointestinal:  Negative for abdominal distention, abdominal pain, constipation, diarrhea, nausea and vomiting.  Genitourinary:  Negative for difficulty urinating, dysuria and urgency.  Musculoskeletal:  Positive for gait problem (uses walker). Negative for back pain, myalgias and neck pain.  Skin:  Negative for rash.       Buttock lesion  Neurological:  Negative for dizziness and weakness.  Psychiatric/Behavioral:  Negative for confusion.     Health Maintenance  Topic Date Due   Pneumococcal Vaccine: 50+ Years (3 of 3 - PCV20 or PCV21) 02/28/2020   Medicare Annual Wellness (AWV)  02/11/2024   Influenza  Vaccine  06/26/2024 (Originally 02/28/2024)   COVID-19 Vaccine (5 - 2025-26 season) 06/26/2024 (Originally 03/30/2024)   DTaP/Tdap/Td (3 - Td or Tdap) 02/26/2030   DEXA SCAN  Completed   Zoster Vaccines- Shingrix   Completed   Meningococcal B Vaccine  Aged Out    Physical Exam: Vitals:   06/01/24 1258  BP: 138/88  Pulse: 62  Temp: 97.8 F (36.6 C)  SpO2: 97%  Weight: 269 lb (122 kg)  Height: 5' 4 (1.626 m)   Body mass index is 46.17 kg/m. Physical Exam Vitals and nursing note reviewed.  Constitutional:      Appearance: Normal appearance.  HENT:     Head: Normocephalic and atraumatic.     Right Ear: Tympanic membrane normal.     Left Ear: Tympanic membrane normal.     Nose: Nose normal.  Mouth/Throat:     Mouth: Mucous membranes are moist.     Pharynx: Oropharynx is clear.  Eyes:     Conjunctiva/sclera: Conjunctivae normal.     Pupils: Pupils are equal, round, and reactive to light.  Cardiovascular:     Rate and Rhythm: Normal rate and regular rhythm.     Heart sounds: No murmur heard. Pulmonary:     Effort: Pulmonary effort is normal. No respiratory distress.     Breath sounds: Normal breath sounds. No wheezing.  Abdominal:     General: Bowel sounds are normal. There is no distension.     Palpations: Abdomen is soft.     Tenderness: There is no abdominal tenderness.  Musculoskeletal:     Cervical back: Normal range of motion. No rigidity.     Right lower leg: No edema.     Left lower leg: No edema.  Lymphadenopathy:     Cervical: No cervical adenopathy.  Skin:    General: Skin is warm and dry.  Neurological:     General: No focal deficit present.     Mental Status: She is alert and oriented to person, place, and time. Mental status is at baseline.  Psychiatric:        Mood and Affect: Mood normal.       Labs reviewed: Basic Metabolic Panel: Recent Labs    06/03/23 0925 11/21/23 0000 05/28/24 0800  NA 132* 136* 133*  K 4.4 4.8 4.9  CL 96* 98* 98*   CO2 30 25* 24*  GLUCOSE 140*  --   --   BUN 11 15 18   CREATININE 0.96 1.0 1.1  CALCIUM 9.9 9.4 9.4  TSH  --   --  4.74   Liver Function Tests: Recent Labs    11/21/23 0000 05/28/24 0800  AST 24 22  ALT 20 17  ALKPHOS 63 76  ALBUMIN 3.8  --    No results for input(s): LIPASE, AMYLASE in the last 8760 hours. No results for input(s): AMMONIA in the last 8760 hours. CBC: Recent Labs    06/03/23 0925 11/21/23 0000 05/28/24 1303  WBC 7.6 5.8 6.7  NEUTROABS 4.9  --   --   HGB 12.6 12.6 12.3  HCT 38.0 39 36  MCV 88.6  --   --   PLT 257 258 278   Lipid Panel: Recent Labs    05/28/24 0800  CHOL 133  HDL 51  LDLCALC 67  TRIG 74   Lab Results  Component Value Date   HGBA1C 5.5 06/16/2014    Procedures since last visit: No results found.  Assessment/Plan Assessment and Plan Assessment & Plan Right perianal growth Right perianal growth differential would include genital wart, - Refer to dermatologist for evaluation   Obesity BMI is 46, indicating obesity. Weight decreased by two pounds since last visit. - Encourage continued weight management efforts.  Hypertension Hypertension well-controlled with current medication regimen. Blood pressure is 138/88 mmHg. - Continue current antihypertensive medications: valsartan , Norvasc , aldactone , and Lopressor .  Hyperlipidemia Hyperlipidemia well-managed with pravastatin . LDL cholesterol at goal at 67 mg/dL, total cholesterol 866 mg/dL. - Continue pravastatin .  Hypothyroidism Thyroid  function normal with current supplementation. - Continue current thyroid  supplement.  Urinary incontinence Urinary incontinence persists, urinary frequency improved post-antibiotic treatment for UTI. VESIcare  trialed previously but not currently needed. - Monitor urinary symptoms, consider another trial of OAB med if worsening   Insomnia  Sleep disturbance managed with trazodone . Occasionally takes additional dose if waking  during night. No  significant grogginess or falls reported, recent fall over ottoman without injury. - Continue trazodone  as needed for sleep. - Monitor for further falls or grogginess.  Chronic cough with hx of asthma  Chronic cough with phlegm production, described as yellow. No increase in shortness of breath reported.      Labs/tests ordered:  * No order type specified *NA Next appt:  F/U 4 months with Dr KANDICE   Total time :  time greater than 50% of total time spent doing pt counseling and coordination of care    Recommended  Flu vaccine  Covid vaccine  Prevnar 20 pneumonia vaccine

## 2024-06-01 NOTE — Patient Instructions (Addendum)
 Flu vaccine  Covid vaccine  Prevnar 20 pneumonia vaccine   Please follow up with the dermatologist regarding the growth that we discussed Call me if you are having trouble getting in

## 2024-06-02 ENCOUNTER — Other Ambulatory Visit: Payer: Self-pay | Admitting: Nurse Practitioner

## 2024-06-23 ENCOUNTER — Other Ambulatory Visit: Payer: Self-pay | Admitting: Adult Health

## 2024-06-23 DIAGNOSIS — N3281 Overactive bladder: Secondary | ICD-10-CM

## 2024-06-29 ENCOUNTER — Encounter: Payer: Self-pay | Admitting: Adult Health

## 2024-06-29 ENCOUNTER — Other Ambulatory Visit: Payer: Self-pay | Admitting: Adult Health

## 2024-06-29 ENCOUNTER — Encounter: Admitting: Adult Health

## 2024-06-29 DIAGNOSIS — N3281 Overactive bladder: Secondary | ICD-10-CM

## 2024-06-29 NOTE — Progress Notes (Signed)
 This encounter was created in error - please disregard.

## 2024-06-29 NOTE — Telephone Encounter (Signed)
 The requested medication is not on active medication list

## 2024-07-14 ENCOUNTER — Other Ambulatory Visit: Payer: Self-pay | Admitting: Nurse Practitioner

## 2024-07-19 NOTE — Progress Notes (Signed)
 Pharmacy Quality Measure Review  This patient is appearing on a report for being at risk of failing the adherence measure for hypertension (ACEi/ARB) medications this calendar year.   Medication: valsartan  320 mg Last fill date: 07/15/2024 for 15 day supply  Insurance report was not up to date. No action needed at this time.   Woodie Jock, PharmD PGY1 Pharmacy Resident  07/19/2024

## 2024-07-31 ENCOUNTER — Other Ambulatory Visit: Payer: Self-pay | Admitting: Adult Health

## 2024-07-31 ENCOUNTER — Other Ambulatory Visit: Payer: Self-pay | Admitting: Internal Medicine

## 2024-07-31 DIAGNOSIS — E7800 Pure hypercholesterolemia, unspecified: Secondary | ICD-10-CM

## 2024-07-31 DIAGNOSIS — N3281 Overactive bladder: Secondary | ICD-10-CM

## 2024-07-31 NOTE — Telephone Encounter (Signed)
 Pharmacy requested refill.  Pended and sent to Provider for approval due to HIGH ALERT Warning.

## 2024-08-14 ENCOUNTER — Other Ambulatory Visit: Payer: Self-pay

## 2024-08-14 ENCOUNTER — Telehealth: Payer: Self-pay

## 2024-08-14 DIAGNOSIS — Z1283 Encounter for screening for malignant neoplasm of skin: Secondary | ICD-10-CM

## 2024-08-14 NOTE — Telephone Encounter (Signed)
 Copied from CRM [id number]. Topic: Referral - Question >> Aug 14, 2024 12:23 PM Graeme ORN wrote: Reason for CRM: Received a call from  On site derm - visiting dermatology for [facility] [phone] States patient has upcoming appt for Wednesday. Insurance now required referral be sent to [facility] portal. Thank You

## 2024-08-26 NOTE — Telephone Encounter (Signed)
 FAXED HIPAA compliant document providing [facility] Referral to [facility] Referral ID [id number] Effective 08/21/2024 - 02/17/2025 Number of visits: 12 FAXED to [phone]

## 2024-09-29 ENCOUNTER — Encounter: Admitting: Internal Medicine
